# Patient Record
Sex: Female | Born: 1955 | Race: White | Hispanic: No | Marital: Married
Health system: Midwestern US, Academic
[De-identification: ages and names within clinical notes are randomized; demographics above are authoritative.]

---

## 2017-06-18 LAB — CBC
Lab: 119 — ABNORMAL LOW (ref 150–379)
Lab: 26 — ABNORMAL LOW (ref 26.6–33.0)
Lab: 31 — ABNORMAL LOW (ref 31.5–35.7)
Lab: 84

## 2017-07-30 ENCOUNTER — Encounter: Admit: 2017-07-30 | Discharge: 2017-07-30 | Payer: MEDICARE

## 2017-07-30 NOTE — Progress Notes
Records Request    Medical records request for continuation of care:    Patient has appointment on 08/15/17   with  Dr. Tyson Alias* .    Please fax records to [facility]  [phone]    Request records:        Recent Labs        Thank you,      [facility]  [facility]  [address] [place] [address]  Phone:  [phone]  Fax:  [phone]

## 2017-08-01 ENCOUNTER — Encounter: Admit: 2017-08-01 | Discharge: 2017-08-01 | Payer: MEDICARE

## 2017-08-15 ENCOUNTER — Encounter: Admit: 2017-08-15 | Discharge: 2017-08-15 | Payer: MEDICARE

## 2017-08-15 ENCOUNTER — Ambulatory Visit: Admit: 2017-08-15 | Discharge: 2017-08-16 | Payer: MEDICARE

## 2017-08-15 DIAGNOSIS — E669 Obesity, unspecified: Secondary | ICD-10-CM

## 2017-08-15 DIAGNOSIS — I10 Essential (primary) hypertension: ICD-10-CM

## 2017-08-15 DIAGNOSIS — R748 Abnormal levels of other serum enzymes: ICD-10-CM

## 2017-08-15 DIAGNOSIS — R945 Abnormal results of liver function studies: Principal | ICD-10-CM

## 2017-08-15 DIAGNOSIS — E119 Type 2 diabetes mellitus without complications: ICD-10-CM

## 2017-08-15 DIAGNOSIS — R0600 Dyspnea, unspecified: ICD-10-CM

## 2017-08-15 DIAGNOSIS — R109 Unspecified abdominal pain: ICD-10-CM

## 2017-08-15 DIAGNOSIS — E1121 Type 2 diabetes mellitus with diabetic nephropathy: ICD-10-CM

## 2017-08-15 DIAGNOSIS — K760 Fatty (change of) liver, not elsewhere classified: ICD-10-CM

## 2017-08-15 DIAGNOSIS — K5730 Diverticulosis of large intestine without perforation or abscess without bleeding: ICD-10-CM

## 2017-08-15 DIAGNOSIS — R0789 Other chest pain: ICD-10-CM

## 2017-08-15 DIAGNOSIS — J329 Chronic sinusitis, unspecified: ICD-10-CM

## 2017-08-15 NOTE — Assessment & Plan Note
Her most recent lipid profile looked great with an LDL in the 40s.

## 2017-08-15 NOTE — Assessment & Plan Note
She had a couple of episodes of chest discomfort that sounds GI.  If they persist I asked her to give me a call.

## 2017-08-15 NOTE — Assessment & Plan Note
Her blood pressure looks fine on the current medical program.

## 2017-08-15 NOTE — Progress Notes
Kristin Moody was in the [place] office today for follow-up regarding date of Service: 08/15/2017    Kristin Moody is a 61 y.o. female.       HPI     Her blood pressure and diabetes.  She is done pretty well over the past year but she has had a lot of trouble with acid reflux and I think this causes some of the chest discomfort that she has had from time to time.  She is not having problems with palpitations, syncope, or near syncope.  She is not having any TIA or stroke symptoms.         Vitals:    08/15/17 1456 08/15/17 1508   BP: 118/86 114/80   Pulse: 84    Weight: 100 kg (220 lb 6.4 oz)    Height: 1.6 m (5' 3)      Body mass index is 39.04 kg/m???.     Past Medical History  Patient Active Problem List    Diagnosis Date Noted   ??? Lower abdominal pain 10/24/2014   ??? Chest discomfort 09/21/2014   ??? Type 2 diabetes mellitus (HCC) 09/21/2014   ??? Essential hypertension 08/13/2012   ??? Abnormal liver function tests 05/13/2011   ??? 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 Obesity (BMI 30-39.9) 05/13/2011         Review of Systems   Constitution: Negative.   HENT: Negative.    Eyes: Negative.    Cardiovascular: Positive for chest pain.        Pressure     Respiratory: Negative.    Endocrine: Negative.    Hematologic/Lymphatic: Negative.    Skin: Negative.    Musculoskeletal: Negative.    Gastrointestinal: Negative.    Genitourinary: Negative.    Neurological: Negative.    Psychiatric/Behavioral: Negative.    Allergic/Immunologic: Negative.        Physical Exam    Physical Exam   General Appearance: no distress   Skin: warm, no ulcers or xanthomas   Digits and Nails: no cyanosis or clubbing   Eyes: conjunctivae and lids normal, pupils are equal and round   Teeth/Gums/Palate: dentition unremarkable, no lesions   Lips & Oral Mucosa: no pallor or cyanosis   Neck Veins: normal JVP , neck veins are not distended   Thyroid: no nodules, masses, tenderness or enlargement   Chest Inspection: chest is normal in appearance Respiratory Effort: breathing comfortably, no respiratory distress   Auscultation/Percussion: lungs clear to auscultation, no rales or rhonchi, no wheezing   PMI: PMI not enlarged or displaced   Cardiac Rhythm: regular rhythm and normal rate   Cardiac Auscultation: S1, S2 normal, no rub, no gallop   Murmurs: no murmur   Peripheral Circulation: normal peripheral circulation   Carotid Arteries: normal carotid upstroke bilaterally, no bruits   Radial Arteries: normal symmetric radial pulses   Abdominal Aorta: no abdominal aortic bruit   Pedal Pulses: normal symmetric pedal pulses   Lower Extremity Edema: no lower extremity edema   Abdominal Exam: soft, non-tender, no masses, bowel sounds normal   Liver & Spleen: no organomegaly   Gait & Station: walks without assistance   Muscle Strength: normal muscle tone   Orientation: oriented to time, place and person   Affect & Mood: appropriate and sustained affect   Language and Memory: patient responsive and seems to comprehend information   Neurologic Exam: neurological assessment grossly intact   Other: moves all extremities  Problems Addressed Today  Encounter Diagnoses   Name Primary?   ??? Essential hypertension    ??? Chest discomfort    ??? Type 2 diabetes mellitus with diabetic nephropathy, unspecified whether long term insulin use (HCC)        Assessment and Plan       Essential hypertension  Her blood pressure looks fine on the current medical program.    Chest discomfort  She had a couple of episodes of chest discomfort that sounds GI.  If they persist I asked her to give me a call.    Type 2 diabetes mellitus  Her most recent lipid profile looked great with an LDL in the 40s.      Current Medications (including today's revisions)  ??? aspirin-caffeine (BAYER BACK AND BODY) 500-32.5 mg tab Take 2 tablets by mouth daily as needed. Indications: PAIN   ??? atorvastatin (LIPITOR) 10 mg tablet Take 1 tablet by mouth daily.   ??? CALCIUM PO Take 600 mg by mouth twice daily. ??? carvedilol (COREG) 6.25 mg tablet Take 6.25 mg by mouth twice daily with meals. Take with food.   ??? cholecalciferol (VITAMIN D-3) 1,000 units tablet Take 1,000 Units by mouth daily.   ??? DULOXETINE HCL (CYMBALTA PO) Take 60 mg by mouth daily.   ??? furosemide (LASIX) 40 mg tablet Take 40 mg by mouth daily.   ??? metFORMIN (GLUCOPHAGE) 500 mg tablet Take 500 mg by mouth twice daily with meals.   ??? metoclopramide (REGLAN) 10 mg tablet Take 10 mg by mouth four times daily.   ??? omeprazole DR(+) (PRILOSEC) 20 mg capsule Take 1 capsule by mouth twice daily.   ??? POTASSIUM CHLORIDE (KLOR-CON M20 PO) Take 20 mg by mouth daily.   ??? promethazine (PHENERGAN) 12.5 mg PO tablet Take 25 mg by mouth every 6 hours as needed.   ??? ranitidine(+) (ZANTAC) 150 mg tablet Take 150 mg by mouth at bedtime daily.   ??? traMADol (ULTRAM) 50 mg tablet Take 50 mg by mouth daily as needed.

## 2017-08-21 ENCOUNTER — Encounter: Admit: 2017-08-21 | Discharge: 2017-08-21 | Payer: MEDICARE

## 2017-08-21 NOTE — Telephone Encounter
pt confirmed appt via televox.

## 2017-08-27 ENCOUNTER — Ambulatory Visit: Admit: 2017-08-27 | Discharge: 2017-08-27 | Payer: MEDICARE

## 2017-08-27 ENCOUNTER — Encounter: Admit: 2017-08-27 | Discharge: 2017-08-27 | Payer: MEDICARE

## 2017-08-27 DIAGNOSIS — I10 Essential (primary) hypertension: ICD-10-CM

## 2017-08-27 DIAGNOSIS — R1010 Upper abdominal pain, unspecified: ICD-10-CM

## 2017-08-27 DIAGNOSIS — E1121 Type 2 diabetes mellitus with diabetic nephropathy: ICD-10-CM

## 2017-08-27 DIAGNOSIS — K760 Fatty (change of) liver, not elsewhere classified: Principal | ICD-10-CM

## 2017-08-27 DIAGNOSIS — K5730 Diverticulosis of large intestine without perforation or abscess without bleeding: ICD-10-CM

## 2017-08-27 DIAGNOSIS — E669 Obesity, unspecified: Secondary | ICD-10-CM

## 2017-08-27 DIAGNOSIS — R945 Abnormal results of liver function studies: Secondary | ICD-10-CM

## 2017-08-27 DIAGNOSIS — E119 Type 2 diabetes mellitus without complications: ICD-10-CM

## 2017-08-27 DIAGNOSIS — R0600 Dyspnea, unspecified: ICD-10-CM

## 2017-08-27 DIAGNOSIS — R748 Abnormal levels of other serum enzymes: ICD-10-CM

## 2017-08-27 DIAGNOSIS — J329 Chronic sinusitis, unspecified: ICD-10-CM

## 2017-08-27 DIAGNOSIS — Z23 Encounter for immunization: ICD-10-CM

## 2017-08-27 DIAGNOSIS — I864 Gastric varices: ICD-10-CM

## 2017-08-27 DIAGNOSIS — R1030 Lower abdominal pain, unspecified: ICD-10-CM

## 2017-08-27 DIAGNOSIS — K31819 Angiodysplasia of stomach and duodenum without bleeding: ICD-10-CM

## 2017-08-27 DIAGNOSIS — K740 Hepatic fibrosis: ICD-10-CM

## 2017-08-27 DIAGNOSIS — R109 Unspecified abdominal pain: ICD-10-CM

## 2017-08-27 DIAGNOSIS — K5900 Constipation, unspecified: Principal | ICD-10-CM

## 2017-08-27 DIAGNOSIS — K5901 Slow transit constipation: ICD-10-CM

## 2017-08-27 LAB — PROTIME INR (PT): Lab: 1.2 MMOL/L (ref 0.8–1.2)

## 2017-08-27 LAB — COMPREHENSIVE METABOLIC PANEL
Lab: 0.9 mg/dL — ABNORMAL HIGH (ref 0.4–1.00)
Lab: 1.7 mg/dL — ABNORMAL HIGH (ref 0.3–1.2)
Lab: 138 MMOL/L (ref 137–147)
Lab: 14 mg/dL (ref 7–25)
Lab: 160 U/L — ABNORMAL HIGH (ref 25–110)
Lab: 203 mg/dL — ABNORMAL HIGH (ref 70–100)
Lab: 27 MMOL/L (ref 21–30)
Lab: 28 U/L (ref 7–56)
Lab: 3.9 MMOL/L (ref 3.5–5.1)
Lab: 4 g/dL — ABNORMAL LOW (ref 3.5–5.0)
Lab: 50 U/L — ABNORMAL HIGH (ref 7–40)
Lab: 7.8 g/dL (ref 6.0–8.0)
Lab: >60 mL/min (ref >60)
Lab: >60 mL/min — ABNORMAL HIGH (ref >60)

## 2017-08-27 LAB — CBC AND DIFF
Lab: 0 10*3/uL (ref 0–0.20)
Lab: 4.5 M/UL (ref 4.0–5.0)
Lab: 9.5 10*3/uL (ref 4.5–11.0)

## 2017-08-27 LAB — ALPHA FETO PROTEIN (AFP): Lab: 6.2 ng/mL (ref 0.0–15.0)

## 2017-08-27 MED ORDER — SODIUM CHLORIDE 0.9 % IV SOLP
INTRAVENOUS | 0 refills | Status: CN
Start: 2017-08-27 — End: ?

## 2017-08-27 NOTE — Progress Notes
Date of Service: 08/27/2017     Subjective:          Kristin Moody is a 61 y.o. female presenting for follow up of liver disease.  Accompanied by husband.    History of Present Illness  Kristin Moody is a 61 y.o. female  [name] presents to the hepatology clinic for continued management of fatty liver disease.  Last seen in clinic April 2018. PMH: DM2 - uncontrolled, chronic pain, hypertension, hyperlipidemia, obesity, GERD s/p fundoplication.  She has been seen in the past by Dr. Dominic Pea, hepatologist, at [facility] [place]. She had liver biopsy 2011 revealed steatosis with stage 1/6 fibrosis. Heterozygous for Alpha-1 antitrypsin. Liver bx was negative.  Recommended low fat diet, exercise, weight loss but has been challenging given she has chronic abdominal pain which is managed by the [facility] pain management team.  She is noncompliant with healthy food choices as well.  Since last seen, in May she developed severe fatigue, shortness of breath, palpitations,  abdominal distention and worsening abdominal pain. She was admitted to [facility] with severe anemia, hgb 5.9. + stool for occult blood.   EGD performed noted GAVE.  No colonoscopy was done as felt GAVE was mostly cause of anemia.  She reports hgb several weeks ago was around 12.  She reports abdominal pain to RUQ and epigastric area had lessened but now has returned. She continues to have abdominal distention. She has had issues with constipation for quite some time but feels getting worse. This is making hemorrhoids bleed more. She was given rectal cream for this.  She stools on average 2 BMs weekly with miralax daily dosing.  No hematochezia or melena. No N/V, ascites, edema, confusion.  She has lost 9 lbs unintentionally.  She has decreased soda pop consumption some otherwise she is unsure as to what to attribute the weight loss to.  Blood sugars remains slightly elevated around the 160s range.  No other changes or new complaints. FH:  Twin sister with A1A, s/p OLT at [facility].     SH;  Negative for tobacco, ETOH, substance use. Married, lives with husband  Past Medical History:  Past Medical History:   Diagnosis Date   ??? Abnormal liver function tests     mildly elevated AST and ALT levels   ??? Chest pain    ??? Chronic abdominal pain    ??? Diverticulosis of colon     descending and sigmoid colon   ??? Dyspnea    ??? Elevated liver enzymes    ??? Essential hypertension 08/13/2012   ??? Fatty infiltration of liver    ??? HTN (hypertension)    ??? Obesity    ??? Obesity (BMI 30-39.9) 05/13/2011   ??? Sinus infection    ??? Type 2 diabetes mellitus (HCC) 09/21/2014       Surgical History:  Past Surgical History:   Procedure Laterality Date   ??? HYSTERECTOMY  1994   ??? UPPER GASTROINTESTINAL ENDOSCOPY  2009   ??? COLONOSCOPY  2009   ??? CYSTOCELE REPAIR  2009    with endocele repair   ??? RECTOCELE REPAIR  03/2009   ??? LIVER BIOPSY  07/17/2010   ??? CHOLECYSTECTOMY     ??? COLONOSCOPY     ??? ESOPHAGOGASTRIC FUNDOPLICATION  2003, 2004    laparoscopic   ??? PR ESOPHAGOSCOPY FLEXIBLE TRANSORAL DIAGNOSTIC              Review of Systems  A complete 10 point  review of systems was asked and answered in the negative unless specifically commented upon in the HPI.    Objective:         ??? aspirin-caffeine (BAYER BACK AND BODY) 500-32.5 mg tab Take 2 tablets by mouth daily as needed. Indications: PAIN   ??? atorvastatin (LIPITOR) 10 mg tablet Take 1 tablet by mouth daily.   ??? CALCIUM PO Take 600 mg by mouth twice daily.   ??? carvedilol (COREG) 6.25 mg tablet Take 6.25 mg by mouth twice daily with meals. Take with food.   ??? cholecalciferol (VITAMIN D-3) 1,000 units tablet Take 1,000 Units by mouth daily.   ??? DULOXETINE HCL (CYMBALTA PO) Take 60 mg by mouth daily.   ??? furosemide (LASIX) 40 mg tablet Take 40 mg by mouth daily.   ??? metFORMIN (GLUCOPHAGE) 500 mg tablet Take 500 mg by mouth twice daily with meals.   ??? metoclopramide (REGLAN) 10 mg tablet Take 10 mg by mouth four times daily. ??? omeprazole DR(+) (PRILOSEC) 20 mg capsule Take 1 capsule by mouth twice daily.   ??? POTASSIUM CHLORIDE (KLOR-CON M20 PO) Take 20 mg by mouth daily.   ??? promethazine (PHENERGAN) 12.5 mg PO tablet Take 25 mg by mouth every 6 hours as needed.   ??? ranitidine(+) (ZANTAC) 150 mg tablet Take 150 mg by mouth at bedtime daily.   ??? traMADol (ULTRAM) 50 mg tablet Take 50 mg by mouth daily as needed.     Vitals:    08/27/17 1110   BP: 122/59   Pulse: 84   Resp: 16   Temp: 36.7 ???C (98 ???F)   TempSrc: Oral   SpO2: 94%   Weight: 98.1 kg (216 lb 3.2 oz)   Height: 160 cm (63)     Body mass index is 38.3 kg/m???.     Physical Exam    Vitals reviewed.   Constitutional: Well-developed, well-nourished, in no apparent distress.    HEENT: Normocephalic. no scleral icterus. Dentition good.  Cardiac:  Regular rate and rhythm.    Respiratory: decreased to auscultation bilaterally.  No wheezes or rales.  GI:  Abdomen soft, mildly distended, + tenderness to light palpation RUQ and epigastric area. BS present. no shifting dullness. + hepatomegaly.      Skin:  Skin is warm and dry. No rash noted.  No jaundice. no spider nevi noted.  no palmar erythema.  Peripheral Vascular: mild lower extremity edema.   Musculoskeletal:  ROM intact, no muscle wasting.   Psychiatric: Normal mood and affect. Behavior is normal.  Neuro:  no asterixis, slight tremor.         Labs and Diagnostic tests:  CBC w/Diff    Lab Results   Component Value Date/Time    WBC 6.9 06/18/2017    RBC 4.74 06/18/2017    HGB 12.4 06/18/2017    HCT 39.7 06/18/2017    MCV 84 06/18/2017    MCH 26.2 (L) 06/18/2017    MCHC 31.2 (L) 06/18/2017    RDW 21.4 (H) 06/18/2017    PLTCT 119 (L) 06/18/2017    MPV 9.3 08/10/2016 11:54 AM    Lab Results   Component Value Date/Time    NEUT 63 08/10/2016 11:54 AM    ANC 5.10 08/10/2016 11:54 AM    LYMA 25 08/10/2016 11:54 AM    ALC 2.00 08/10/2016 11:54 AM    MONA 8 08/10/2016 11:54 AM    AMC 0.70 08/10/2016 11:54 AM    EOSA 3 08/10/2016 11:54 AM AEC  0.30 08/10/2016 11:54 AM    BASA 1 08/10/2016 11:54 AM    ABC 0.10 08/10/2016 11:54 AM        Comprehensive Metabolic Profile    Lab Results   Component Value Date/Time    NA 140 01/01/2017    K 4.7 01/01/2017    CL 99 01/01/2017    CO2 27 01/01/2017    GAP 9 08/10/2016 11:54 AM    BUN 12 01/01/2017    CR 0.88 01/01/2017    GLU 139 (H) 01/01/2017    Lab Results   Component Value Date/Time    CA 9.6 01/01/2017    ALBUMIN 3.7 01/01/2017    TOTPROT 6.9 01/01/2017    ALKPHOS 146 (H) 01/01/2017    AST 45 (H) 01/01/2017    ALT 24 01/01/2017    TOTBILI 0.9 01/01/2017    GFR 58 (L) 08/10/2016 11:54 AM    GFRAA >60 08/10/2016 11:54 AM          Imaging:    US ABDOMEN COMPLETE  April 2018    Clinical Indication: Female, 61 years old. ???Fatty liver disease. Screening   for HCC.    Comparison: No prior studies are available for comparison.    Technique: Multiple real-time grayscale sonographic images were obtained   of the abdomen. Additional color Doppler images were obtained.     Findings:    The visualized pancreas is unremarkable.    The visualized aorta is unremarkable. ???The visualized portion of the upper   IVC is unremarkable.    There is diffuse increased echogenicity throughout the heterogeneous   liver, which measures 18.4 cm. No focal lesions identified.     The gallbladder is surgically absent. The common duct measures 0.6 cm,   previously 0.6 cm.    The spleen measures 11.0 cm compared to 10.0 cm previously without focal   lesions.    The right kidney measures 11.9 x 4.1 cm. The left kidney measures 10.6 x   6.2 cm. No hydronephrosis, nephrolithiasis is identified, or renal mass is   identified.     The urinary bladder is unremarkable. No abdominal or pelvic ascites is   identified.     IMPRESSION  Mild hepatomegaly and diffuse hepatic steatosis. No discrete hepatic mass.    Finalized by Judie Petit, M.D. on 02/28/2017 5:48 PM. Dictated by Laray Anger, M.D. on 02/28/2017 2:18 PM.  CT angio 04/08/17: No pulmonary embolism seen.  Mild cardiac prominence is seen.  Mild mediastinal and possibly mild hilar adenopathy which is nonspecific.  Elevation of the right diaphragm with some vascular crowding and atelectasis versus minimal strand-like infiltrate in the medial right middle lobe.  The remainder of the lungs are clear.     Endoscopy:  EGD 04/09/17 [facility]:      Pathology:  Fragments of hyperplastic polyp with ulceration and thermal artifact. Detached fragment of fibrinoid material with acute inflammation, consistent with ulcer bed.     Assessment and Plan:  Non-Alcoholic Fatty Liver Disease  -we have had long discussion about nonalcoholic fatty liver disease (NAFLD) in the past.  We discussed how fat deposits in the liver, how this leads to inflammation, and how chronic inflammation (NASH) can ultimately lead to scarring and cirrhosis.  The long-term complications of fatty liver disease were discussed with the patient, including the increased risk of cardiovascular disease complications,the risk of developing diabetes (if not already), and variable progression to cirrhosis and end stage liver disease.  -  At this time, the only way to differentiate between benign steatosis vs. nonalcoholic steatohepatitis (NASH) is to perform a liver biopsy which pt had done in 2011 showing 50% steatosis and stage I fibrosis.   - It is important to note that liver tests alone as a screening test for NASH are not sensitive, as up to 20-30% of patients can have NASH with fibrosis despite having normal liver chemistries.   - Regarding the management of fatty liver disease, we have discussed about an appropriate diet, exercise and weight loss plan.  The patient should exercise regularly 3-4 times per week, with an average of about 30-45 minutes per day. The patient should be on low-carbohydrate, low-calorie diet (1500-1700 calories per day). The weight loss plan is to lose 7-10% of body weight in the next three to six months' time.  It has shown that patients who exercise regularly can have improvement of insulin resistance and resolution of fatty liver disease, even if they are not able to lose weight.   - We plan to order liver tests to be checked every 6  months to assess for dynamic changes, as a potential marker of another cause of chronic liver disease. Will update CBC, CMP, INR today to assess for any changes.     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  Twin sister with A1A, S/p OLT at [facility].       Lipid Management/Screening  - Management of elevated cholesterol is also important in this population.  The use of statins (HMG-CoA reductase medications) are an effective means of therapy and are not contra-indicated in those with abnormal liver tests OR those with cirrhosis.  The value of these medications in this population far outweigh the minor risks of abnormal liver tests.  Goal LDL in those with NASH are < 100 mg/dL. Lipid panel in Feb 2018 showed LDL, TC, trigs at goal.  Continue with statin therapy.     Diabetes Management/Screening:  - Diabetes management is crucial with ideal goal Hgb A1c goal of =/< 7%. She is on metformin and recommend she continue.  Will check hgba1c today.     GI bleed:  Developed shortness of breath with exertion, palpitations, IDA, severe anemia with hgb 5.9. Was seen at [facility].  EGD 04/09/17 showed stomach polyps that were inflamed/friable (single polypectomy) and scattered petechiae with friability verses early GAVE. Cauterized.  Felt this was likely source of bleeding so colonoscopy was not done.  She was transfused with 3 units blood and started oral iron supplementation which was recently stopped.  She continues on PPI and H2 blocker therapy.  No mention of varices in report.    She continues to have abdominal distention with abdominal pain in the epigastric and RUQ areas very similar to that in May. Although she does not observe melena or hematochezia she remains concerned about the pain and distention.  Will schedule for EGD along with colonoscopy, as discussed below, to reassess gastric area.  Will check [place] to follow up on worsening RUQ and epigastric pain.     Adult Health Maintenance:   - The patient's last colonoscopy was 2009  and it noted normal.  - she was recommended to repeat in 10 years, however, given she has had worsening constipation issues causing worsening hemorrhoidal issues.  Recommend moving forward with scheduling colonoscopy to reassess given worsening constipation issues.  Will schedule under MAC. Bowel prep was discussed.  Immunizations:   + immunity to HAV and HBV.  Flu vaccine given today per pt request.     Obesity:  BMI 38.3.      Nutrition:  -As with most patients with chronic liver disease, there is a significant degree of protein malnutrition.  Dicussed need to change dietary habits to improve overall protein balance.  Discussed the importance of eating between 1.2-1.5g/kg/day lean protein like eggs, fish, chicken, nuts, and legumes, in addition to a diet rich in fresh fruits and vegetables.  Continue to follow a sodium restricted (<2g sodium diet) and discussed the need to minimize the intake of carbohydrates and sugars, to avoid obesity.   - Recommend a bedtime snack with protein and complex carbohydrate to minimize risk of muscle catabolism overnight.    Routine Health Care in Patient with Chronic Liver Disease:  - All patients with liver disease should avoid the use of Non-steroidal Anti-Inflammatory (NSAID) medications as they can cause significant injury to the kidneys in this population.  - Recommended the patient avoid herbal supplements and over the counter herbal remedies due to potential hepatic toxicities.      Follow Up:   6 months or sooner if needed. The patient is to call our office with any questions. Thank you very much for the opportunity to participate in the care of this patient.  If you have any further questions, please don't hesitate to contact our office.    _____________________________  [name] APRN   [facility]  Hepatology  Office Number: [phone]

## 2017-08-29 DIAGNOSIS — K5900 Constipation, unspecified: ICD-10-CM

## 2017-08-29 LAB — HEMOGLOBIN A1C: Lab: 10 % — ABNORMAL HIGH (ref 4.0–6.0)

## 2017-09-05 ENCOUNTER — Encounter: Admit: 2017-09-05 | Discharge: 2017-09-05 | Payer: MEDICARE

## 2017-09-05 DIAGNOSIS — K760 Fatty (change of) liver, not elsewhere classified: Principal | ICD-10-CM

## 2017-09-05 DIAGNOSIS — R945 Abnormal results of liver function studies: ICD-10-CM

## 2017-09-05 DIAGNOSIS — K740 Hepatic fibrosis: ICD-10-CM

## 2017-09-05 NOTE — Telephone Encounter
-----   Message from Hermine Messick, APRN sent at 09/02/2017 10:01 AM CDT -----  Hgba1c has worsened.  Recommend she follow up with DM provider to work on glucose control. AFP is normal. Mild increase in liver tests.  MELD is 10. Plan to repeat labs in 6 months. Contact pt with results and recommendations.

## 2017-09-05 NOTE — Telephone Encounter
Reviewed lab results with patient.  Will fax labs to Dr Chalmers Cater for review as she is managing DM.

## 2017-10-07 ENCOUNTER — Encounter: Admit: 2017-10-07 | Discharge: 2017-10-07 | Payer: MEDICARE

## 2017-10-07 DIAGNOSIS — K760 Fatty (change of) liver, not elsewhere classified: Principal | ICD-10-CM

## 2017-10-07 LAB — BASIC METABOLIC PANEL
Lab: 0.8 K/UL — ABNORMAL LOW (ref 34.0–46.6)
Lab: 12 U/L — ABNORMAL LOW (ref 11.1–15.9)
Lab: 136 K/UL — ABNORMAL LOW (ref 31.5–35.7)
Lab: 22
Lab: 350 MMOL/L — ABNORMAL HIGH (ref 65–99)
Lab: 5
Lab: 72 mL/min — ABNORMAL LOW (ref >60)
Lab: 83 mL/min — ABNORMAL LOW (ref >60)
Lab: 9.3
Lab: 97 — ABNORMAL LOW (ref 150–379)

## 2017-10-07 LAB — CBC AND DIFF: Lab: 7.2 U/L (ref 7–40)

## 2017-10-17 ENCOUNTER — Encounter: Admit: 2017-10-17 | Discharge: 2017-10-17 | Payer: MEDICARE

## 2017-10-17 NOTE — Telephone Encounter
Reviewed results with patient.  PCP has ordered Iron to treat anemia.  She will get a scope done on 12/21.  I reminded her of signs of GIB (tarry stools and coffee ground emesis.)

## 2017-10-17 NOTE — Telephone Encounter
-----   Message from Hermine Messick, APRN sent at 10/13/2017  9:28 PM CST -----  Blood glucose is too high.  Work on glucose control. hgb has dropped 3 gms in one month. She had recent EGD that was abnormal and was considered source of anemia.  She needs to get colonoscopy done and may need repeat EGD if she is having black, tarry stools.  Contact pt with results and recommendations. Please ensure she follows up with her PCP.

## 2017-10-31 ENCOUNTER — Encounter: Admit: 2017-10-31 | Discharge: 2017-10-31 | Payer: MEDICARE

## 2017-10-31 DIAGNOSIS — K760 Fatty (change of) liver, not elsewhere classified: Principal | ICD-10-CM

## 2017-10-31 LAB — CBC AND DIFF
Lab: 0.1
Lab: 1
Lab: 1.8
Lab: 10 — ABNORMAL LOW (ref 11.1–15.9)
Lab: 169
Lab: 18 — ABNORMAL HIGH (ref 12.3–15.4)
Lab: 23 — ABNORMAL LOW (ref 26.6–33.0)
Lab: 3.1
Lab: 30 — ABNORMAL LOW (ref 31.5–35.7)
Lab: 32
Lab: 33 — ABNORMAL LOW (ref 34.0–46.6)
Lab: 4.3
Lab: 5.7
Lab: 54
Lab: 0.3
Lab: 0.5
Lab: 5
Lab: 77 — ABNORMAL LOW (ref 79–97)
Lab: 8

## 2017-11-01 ENCOUNTER — Encounter: Admit: 2017-11-01 | Discharge: 2017-11-01 | Payer: MEDICARE

## 2017-11-01 MED ORDER — ATORVASTATIN 10 MG PO TAB
10 mg | ORAL_TABLET | Freq: Every day | ORAL | 3 refills | Status: AC
Start: 2017-11-01 — End: 2018-12-23

## 2017-11-04 ENCOUNTER — Encounter: Admit: 2017-11-04 | Discharge: 2017-11-04 | Payer: MEDICARE

## 2017-11-04 MED ORDER — PEG-ELECTROLYTE SOLN 420 GRAM PO SOLR
4 L | Freq: Once | ORAL | 0 refills | Status: AC
Start: 2017-11-04 — End: ?

## 2017-11-04 NOTE — Telephone Encounter
-----   Message from Hermine Messick, APRN sent at 11/03/2017  2:07 PM CST -----  Mild improvement in anemia.  Fax result to PCP clinic for review.

## 2017-11-04 NOTE — Telephone Encounter
Reviewed lab orders with patient.  RX for bowel prep for scope on Friday.  Instructed patient to have Clear liquids Wed night and Thursday.  Bowel Prep on Thursday.  NPO after MN Friday.

## 2017-11-07 DIAGNOSIS — Z1211 Encounter for screening for malignant neoplasm of colon: Principal | ICD-10-CM

## 2017-11-07 DIAGNOSIS — D509 Iron deficiency anemia, unspecified: Secondary | ICD-10-CM

## 2017-11-08 ENCOUNTER — Ambulatory Visit: Admit: 2017-11-08 | Discharge: 2017-11-08 | Payer: MEDICARE

## 2017-11-08 ENCOUNTER — Ambulatory Visit: Admit: 2017-11-07 | Discharge: 2017-11-07 | Payer: MEDICARE

## 2017-11-08 ENCOUNTER — Encounter: Admit: 2017-11-08 | Discharge: 2017-11-08 | Payer: MEDICARE

## 2017-11-08 DIAGNOSIS — K760 Fatty (change of) liver, not elsewhere classified: ICD-10-CM

## 2017-11-08 DIAGNOSIS — R109 Unspecified abdominal pain: ICD-10-CM

## 2017-11-08 DIAGNOSIS — D127 Benign neoplasm of rectosigmoid junction: ICD-10-CM

## 2017-11-08 DIAGNOSIS — K5730 Diverticulosis of large intestine without perforation or abscess without bleeding: ICD-10-CM

## 2017-11-08 DIAGNOSIS — D122 Benign neoplasm of ascending colon: ICD-10-CM

## 2017-11-08 DIAGNOSIS — D123 Benign neoplasm of transverse colon: ICD-10-CM

## 2017-11-08 DIAGNOSIS — Z1211 Encounter for screening for malignant neoplasm of colon: Principal | ICD-10-CM

## 2017-11-08 DIAGNOSIS — K210 Gastro-esophageal reflux disease with esophagitis: ICD-10-CM

## 2017-11-08 DIAGNOSIS — K317 Polyp of stomach and duodenum: ICD-10-CM

## 2017-11-08 DIAGNOSIS — D509 Iron deficiency anemia, unspecified: ICD-10-CM

## 2017-11-08 DIAGNOSIS — K648 Other hemorrhoids: ICD-10-CM

## 2017-11-08 DIAGNOSIS — I10 Essential (primary) hypertension: ICD-10-CM

## 2017-11-08 DIAGNOSIS — E119 Type 2 diabetes mellitus without complications: ICD-10-CM

## 2017-11-08 DIAGNOSIS — E669 Obesity, unspecified: ICD-10-CM

## 2017-11-08 DIAGNOSIS — K766 Portal hypertension: ICD-10-CM

## 2017-11-08 DIAGNOSIS — I868 Varicose veins of other specified sites: ICD-10-CM

## 2017-11-08 DIAGNOSIS — K3189 Other diseases of stomach and duodenum: ICD-10-CM

## 2017-11-08 DIAGNOSIS — K31819 Angiodysplasia of stomach and duodenum without bleeding: ICD-10-CM

## 2017-11-08 DIAGNOSIS — K644 Residual hemorrhoidal skin tags: ICD-10-CM

## 2017-11-08 DIAGNOSIS — R0600 Dyspnea, unspecified: ICD-10-CM

## 2017-11-08 DIAGNOSIS — R945 Abnormal results of liver function studies: Principal | ICD-10-CM

## 2017-11-08 DIAGNOSIS — J329 Chronic sinusitis, unspecified: ICD-10-CM

## 2017-11-08 DIAGNOSIS — R748 Abnormal levels of other serum enzymes: ICD-10-CM

## 2017-11-08 LAB — POC GLUCOSE: Lab: 173 mg/dL — ABNORMAL HIGH (ref 70–100)

## 2017-11-08 MED ORDER — PROPOFOL 10 MG/ML IV EMUL 20 ML (INFUSION)(AM)(OR)
INTRAVENOUS | 0 refills | Status: DC
Start: 2017-11-08 — End: 2017-11-08
  Administered 2017-11-08: 15:00:00 120 ug/kg/min via INTRAVENOUS

## 2017-11-08 MED ORDER — LACTATED RINGERS IV SOLP
1000 mL | Freq: Once | INTRAVENOUS | 0 refills | Status: CP
Start: 2017-11-08 — End: ?
  Administered 2017-11-08: 15:00:00 1000.000 mL via INTRAVENOUS

## 2017-11-08 MED ORDER — PROPOFOL INJ 10 MG/ML IV VIAL
0 refills | Status: DC
Start: 2017-11-08 — End: 2017-11-08
  Administered 2017-11-08: 15:00:00 30 mg via INTRAVENOUS
  Administered 2017-11-08 (×2): 20 mg via INTRAVENOUS
  Administered 2017-11-08: 15:00:00 30 mg via INTRAVENOUS
  Administered 2017-11-08: 15:00:00 40 mg via INTRAVENOUS
  Administered 2017-11-08 (×2): 30 mg via INTRAVENOUS

## 2017-11-08 MED ORDER — LIDOCAINE (PF) 200 MG/10 ML (2 %) IJ SYRG
0 refills | Status: DC
Start: 2017-11-08 — End: 2017-11-08
  Administered 2017-11-08: 15:00:00 80 mg via INTRAVENOUS

## 2017-11-08 NOTE — Discharge Planning (AHS/AVS)
EGD/Upper EUS/ERCP/Antegrade Enteroscopy  Post Upper Endoscopy Instructions      -Nothing to eat or drink for 1.5 hours after your procedure if you have had the numbing gargle or spray.  Start with small sips of water at _____________.  If tolerated well, you may advance your diet as tolerated or directed by your physician.      -You may have a sore throat after the procedure for 2-3 days.  Try sucrets or lozenges to help ease the pain.  If it continues please contact us.    -If you feel feverish, have a temperature of 101 degrees or higher, persistent nausea and vomiting, abdominal pain or dark stools; please notify your nurse or GI physician.    -You may have abdominal cramping following the procedure this can be relieved by belching or passing air.    -If you have redness or swelling at the IV site, place a warm, wet washcloth over the affected areas for 15 minutes, 3-4 times a day until the redness subsides.  If symptoms continue for 2-3 days, contact your regular physician.    - If you have bleeding from your mouth, over 2 tablespoons and increasing, please notify your physician.  A small amount of bleeding is normal if a biopsy or polyps were taken.  If you are vomiting blood you need to seek immediate medical attention.    - You may resume all your routine medications, if medications need to be held your physician and/or nurse will notify you post procedure.    SPECIFIC INSTRUCTIONS  INPATIENTS:  Ask for help when you get up in your room, as you may still be drowsy from your sedation.    OUTPATIENTS:  A. Because of sedation and lack of coordination, UNTIL TOMORROW, DO NOT:  1. Operate any motorized vehicle - this includes driving.  2. Sign any legal documents or conduct important business matters.  3. Use any dangerous machinery (chain saw, lawnmower, etc.).  4. Drink any alcoholic beverages.  Should you have any questions or concerns after your procedure please call [phone] M-F 8am-5:00 pm. After 5:00 pm, holidays or weekends call [phone] and ask for the GI Doctor on call.

## 2017-11-08 NOTE — Anesthesia Pre-Procedure Evaluation
Anesthesia Pre-Procedure Evaluation    Name: Kristin Moody      MRN: [id number]     DOB: 03-16-1956     Age: 61 y.o.     Sex: female   __________________________________________________________________________     Procedure Date: 11/08/2017   Procedure: Procedure(s):  COLONOSCOPY  ESOPHAGOGASTRODUODENOSCOPY     Physical Assessment  Vital Signs (last filed in past 24 hours):         Patient History  Allergies   Allergen Reactions   ??? Contrast Dye Iv, Iodine Containing [Iodinated Contrast- Oral And Iv Dye] RASH   ??? Sulfa (Sulfonamide Antibiotics) RASH   ??? Codeine NAUSEA AND VOMITING   ??? Morphine SEE COMMENTS     Pt reports burns her veins   ??? Penicillin G SEE COMMENTS     Throat issues, blisters in throat    ??? Tramadol NAUSEA ONLY and ITCHING     Takes this medication regularly         Current Medications    Medication Directions   aspirin-caffeine (BAYER BACK AND BODY) 500-32.5 mg tab Take 2 tablets by mouth daily as needed. Indications: PAIN   atorvastatin (LIPITOR) 10 mg tablet TAKE 1 TABLET BY MOUTH DAILY   CALCIUM PO Take 600 mg by mouth twice daily.   carvedilol (COREG) 6.25 mg tablet Take 6.25 mg by mouth twice daily with meals. Take with food.   cholecalciferol (VITAMIN D-3) 1,000 units tablet Take 1,000 Units by mouth daily.   DULOXETINE HCL (CYMBALTA PO) Take 60 mg by mouth daily.   furosemide (LASIX) 40 mg tablet Take 40 mg by mouth daily.   metFORMIN (GLUCOPHAGE) 500 mg tablet Take 500 mg by mouth twice daily with meals.   metoclopramide (REGLAN) 10 mg tablet Take 10 mg by mouth four times daily.   omeprazole DR(+) (PRILOSEC) 20 mg capsule Take 1 capsule by mouth twice daily.   POTASSIUM CHLORIDE (KLOR-CON M20 PO) Take 20 mg by mouth daily.   promethazine (PHENERGAN) 12.5 mg PO tablet Take 25 mg by mouth every 6 hours as needed.   ranitidine(+) (ZANTAC) 150 mg tablet Take 150 mg by mouth at bedtime daily.   traMADol (ULTRAM) 50 mg tablet Take 50 mg by mouth daily as needed. Review of Systems/Medical History      Patient summary reviewed  Nursing notes reviewed  Pertinent labs reviewed    PONV Screening: Female gender and Non-smoker  No history of anesthetic complications  No family history of anesthetic complications        Pulmonary         no COPD      Shortness of breath      Cardiovascular          Beta Blocker therapy: Yes      Beta blockers within 24 hours: Yes      Hypertension,       Hyperlipidemia      Orthopnea      Dyspnea on exertion      GI/Hepatic/Renal           GERD, poorly controlled      Liver disease (NASH/A1A deficiency)      No ascites      No esophageal varices      Hx of GI bleed in May 2018 treated at [facility] with cautery to stomach- dx with gastric varices        Neuro/Psych       No seizures      No  CVA      Chronic opioid use        Psychiatric history          Anxiety        Endocrine/Other       Diabetes, poorly controlled, type 2; using insulin      Obesity   Physical Exam    Airway Findings      Mallampati: III      TM distance: >3 FB      Neck ROM: full      Mouth opening: good      Airway patency: adequate    Dental Findings:       Upper dentures and lower dentures    Cardiovascular Findings:       Rhythm: regular      Rate: normal      No murmur    Pulmonary Findings:       Breath sounds clear to auscultation.       Diagnostic Tests  Hematology:   Lab Results   Component Value Date    HGB 10.0 10/28/2017    HCT 33.0 10/28/2017    PLTCT 169 10/28/2017    WBC 5.7 10/28/2017    NEUT 54 10/28/2017    ANC 3.1 10/28/2017    LYMPH 32 10/28/2017    ALC 1.8 10/28/2017    MONA 9 08/27/2017    AMC 0.5 10/28/2017    EOSA 2 08/27/2017    ABC 0.1 10/28/2017    BASOPHILS 1 10/28/2017    MCV 77 10/28/2017    MCH 23.2 10/28/2017    MCHC 30.3 10/28/2017    MPV 10.4 08/27/2017    RDW 18.4 10/28/2017         General Chemistry:   Lab Results   Component Value Date    NA 136 10/03/2017    K 5.0 10/03/2017    CL 97 10/03/2017    CO2 22 10/03/2017    GAP 11 08/27/2017 BUN 12 10/03/2017    CR 0.88 10/03/2017    GLU 350 10/03/2017    CA 9.3 10/03/2017    ALBUMIN 4.0 08/27/2017    TOTBILI 1.7 08/27/2017      Coagulation:   Lab Results   Component Value Date    PT 12.1 02/09/2015    PTT 26.5 04/28/2012    INR 1.2 08/27/2017         Anesthesia Plan    ASA score: 3   Plan: MAC  Induction method: intravenous  NPO status: acceptable      Informed Consent  Anesthetic plan and risks discussed with patient.  Use of blood products discussed with patient  Blood Consent: consented      Plan discussed with: anesthesiologist, SRNA and CRNA.

## 2017-11-08 NOTE — H&P (View-Only)
Pre Procedure History and Physical/Sedation Plan    Name:Kristin Moody                                                                   MRN: [id number]                 DOB:10-Mar-1956          Age: 61 y.o.  Date of Service: 11/08/2017    Date of Procedure:  11/08/2017    Planned Procedure(s):  GI:  EGD and Colonoscopy  Sedation/Medication Plan: MAC (Monitored Anesthesia Care)  Discussion/Reviews:  Physician has discussed risks and alternatives of this type of sedation and above planned procedures with patient  ___________________________________________________________________  Chief Complaint:  Anemia, abdominal pain    History of Present Illness: Kristin Moody is a 61 y.o. female who is here for EGD and colonoscopy for evaluation of anemia, abdominal pain and screening colonosocpy    Previous Anesthetic/Sedation History:  Reviewed    Past Medical History:   Diagnosis Date   ??? Abnormal liver function tests     mildly elevated AST and ALT levels   ??? Chest pain    ??? Chronic abdominal pain    ??? Diverticulosis of colon     descending and sigmoid colon   ??? Dyspnea    ??? Elevated liver enzymes    ??? Essential hypertension 08/13/2012   ??? Fatty infiltration of liver    ??? HTN (hypertension)    ??? Obesity    ??? Obesity (BMI 30-39.9) 05/13/2011   ??? Sinus infection    ??? Type 2 diabetes mellitus (HCC) 09/21/2014     Past Surgical History:   Procedure Laterality Date   ??? HYSTERECTOMY  1994   ??? UPPER GASTROINTESTINAL ENDOSCOPY  2009   ??? COLONOSCOPY  2009   ??? CYSTOCELE REPAIR  2009    with endocele repair   ??? RECTOCELE REPAIR  03/2009   ??? LIVER BIOPSY  07/17/2010   ??? CHOLECYSTECTOMY     ??? COLONOSCOPY     ??? ESOPHAGOGASTRIC FUNDOPLICATION  2003, 2004    laparoscopic   ??? PR ESOPHAGOSCOPY FLEXIBLE TRANSORAL DIAGNOSTIC       Pertinent medical/surgical history reviewed  Pertinent family history reviewed  Social History     Tobacco Use   ??? Smoking status: Never Smoker   ??? Smokeless tobacco: Never Used   Substance Use Topics ??? Alcohol use: No   ??? Drug use: No     Social History     Substance and Sexual Activity   Drug Use No     Allergies:  Contrast dye iv, iodine containing [iodinated contrast- oral and iv dye]; Sulfa (sulfonamide antibiotics); Codeine; Morphine; Penicillin g; and Tramadol  Medications  Current Facility-Administered Medications   Medication   ??? lactated ringers infusion     Review of Systems:  A 14 point review of systems was negative except for: as mentioned in HPI           Physical Exam:  Respirations: (P) 14 PER MINUTE (12/21 0759)  BP: (P) 131/58 (12/21 0759)  General appearance: alert and well-developed  Throat: Lips, mucosa, and tongue normal. Teeth and gums normal  Lungs: clear to auscultation bilaterally  Heart: regular rate  and rhythm, S1, S2 normal, no murmur, click, rub or gallop  Abdomen: soft, non-tender. Bowel sounds normal. No masses,  no organomegaly  Extremities: extremities normal, atraumatic, no cyanosis or edema  Airway:  airway assessment performed  Mallampati II (soft palate, uvula, fauces visible)  Anesthesia Classification:  ASA III (A patient with a severe systemic disease that limits activity, but is not incapacitating)  NPO Status: Acceptable  Pregnancy Status: Not Pregnant    Lab/Radiology/Other Diagnostic Tests  Labs:    Hematology:    Lab Results   Component Value Date    HGB 10.0 10/28/2017    HCT 33.0 10/28/2017    PLTCT 169 10/28/2017    WBC 5.7 10/28/2017    NEUT 54 10/28/2017    ANC 3.1 10/28/2017    LYMPH 32 10/28/2017    ALC 1.8 10/28/2017    RBC 4.31 10/28/2017    MONA 9 08/27/2017    AMC 0.5 10/28/2017    EOSA 2 08/27/2017    ABC 0.1 10/28/2017    BASOPHILS 1 10/28/2017    MCV 77 10/28/2017    MCH 23.2 10/28/2017    MCHC 30.3 10/28/2017    MPV 10.4 08/27/2017    RDW 18.4 10/28/2017   , Coagulation:    Lab Results   Component Value Date    PT 12.1 02/09/2015    PTT 26.5 04/28/2012    INR 1.2 08/27/2017    and General Chemistry:    Lab Results   Component Value Date NA 136 10/03/2017    K 5.0 10/03/2017    CL 97 10/03/2017    CO2 22 10/03/2017    GAP 11 08/27/2017    BUN 12 10/03/2017    CR 0.88 10/03/2017    GLU 350 10/03/2017    CA 9.3 10/03/2017    ALBUMIN 4.0 08/27/2017    TOTBILI 1.7 08/27/2017          Daphane Shepherd, MBBS  Pager [phone]

## 2017-11-08 NOTE — Anesthesia Post-Procedure Evaluation
Post-Anesthesia Evaluation    Name: Kristin Moody      MRN: [id number]     DOB: 14-Apr-1956     Age: 61 y.o.     Sex: female   __________________________________________________________________________     Procedure Date: 11/08/2017  Procedure: Procedure(s):  COLONOSCOPY  ESOPHAGOGASTRODUODENOSCOPY  ESOPHAGOGASTRODUODENOSCOPY WITH BIOPSY - FLEXIBLE  COLONOSCOPY WITH HOT BIOPSY FORCEPS REMOVAL TUMOR/ POLYP/ OTHER LESION      Surgeon: Surgeon(s):  Felicita Gage, MD  Jackelyn Hoehn, Vivek, MBBS    Post-Anesthesia Vitals  BP: 119/63 (12/21 1015)  Temp: 36.4 C (97.5 F) (12/21 0947)  Pulse: 83 (12/21 1015)  Respirations: 18 PER MINUTE (12/21 1000)  SpO2: 95 % (12/21 1015)  O2 Delivery: None (Room Air) (12/21 1000)  SpO2 Pulse: 83 (12/21 1015)      Post Anesthesia Evaluation Note    Evaluation location: Pre/Post  Patient participation: recovered; patient participated in evaluation  Level of consciousness: alert    Pain score: 1  Pain management: adequate    Hydration: normovolemia  Temperature: 36.0C - 38.4C  Airway patency: adequate    Perioperative Events  Perioperative events:  no       Post-op nausea and vomiting: no PONV    Postoperative Status  Cardiovascular status: hemodynamically stable  Respiratory status: spontaneous ventilation  Follow-up needed: none  Additional comments: Patient ready for discharge to home with family        Perioperative Events  Perioperative Event: No  Emergency Case Activation: No

## 2017-11-09 ENCOUNTER — Encounter: Admit: 2017-11-09 | Discharge: 2017-11-09 | Payer: MEDICARE

## 2017-11-09 DIAGNOSIS — J329 Chronic sinusitis, unspecified: ICD-10-CM

## 2017-11-09 DIAGNOSIS — E669 Obesity, unspecified: Secondary | ICD-10-CM

## 2017-11-09 DIAGNOSIS — R0600 Dyspnea, unspecified: ICD-10-CM

## 2017-11-09 DIAGNOSIS — I10 Essential (primary) hypertension: ICD-10-CM

## 2017-11-09 DIAGNOSIS — K760 Fatty (change of) liver, not elsewhere classified: ICD-10-CM

## 2017-11-09 DIAGNOSIS — K5730 Diverticulosis of large intestine without perforation or abscess without bleeding: ICD-10-CM

## 2017-11-09 DIAGNOSIS — E119 Type 2 diabetes mellitus without complications: ICD-10-CM

## 2017-11-09 DIAGNOSIS — R109 Unspecified abdominal pain: ICD-10-CM

## 2017-11-09 DIAGNOSIS — R748 Abnormal levels of other serum enzymes: ICD-10-CM

## 2017-11-09 DIAGNOSIS — R945 Abnormal results of liver function studies: Principal | ICD-10-CM

## 2017-11-26 ENCOUNTER — Encounter: Admit: 2017-11-26 | Discharge: 2017-11-26 | Payer: MEDICARE

## 2017-12-26 ENCOUNTER — Encounter: Admit: 2017-12-26 | Discharge: 2017-12-26 | Payer: MEDICARE

## 2018-02-18 ENCOUNTER — Encounter: Admit: 2018-02-18 | Discharge: 2018-02-18 | Payer: MEDICARE

## 2018-02-20 ENCOUNTER — Encounter: Admit: 2018-02-20 | Discharge: 2018-02-20 | Payer: MEDICARE

## 2018-02-20 DIAGNOSIS — R945 Abnormal results of liver function studies: Principal | ICD-10-CM

## 2018-02-20 DIAGNOSIS — R109 Unspecified abdominal pain: ICD-10-CM

## 2018-02-20 DIAGNOSIS — R748 Abnormal levels of other serum enzymes: ICD-10-CM

## 2018-02-20 DIAGNOSIS — E669 Obesity, unspecified: Secondary | ICD-10-CM

## 2018-02-20 DIAGNOSIS — K5730 Diverticulosis of large intestine without perforation or abscess without bleeding: ICD-10-CM

## 2018-02-20 DIAGNOSIS — R0600 Dyspnea, unspecified: ICD-10-CM

## 2018-02-20 DIAGNOSIS — J329 Chronic sinusitis, unspecified: ICD-10-CM

## 2018-02-20 DIAGNOSIS — E119 Type 2 diabetes mellitus without complications: ICD-10-CM

## 2018-02-20 DIAGNOSIS — I10 Essential (primary) hypertension: ICD-10-CM

## 2018-02-20 DIAGNOSIS — K760 Fatty (change of) liver, not elsewhere classified: ICD-10-CM

## 2018-02-25 ENCOUNTER — Encounter: Admit: 2018-02-25 | Discharge: 2018-02-25 | Payer: MEDICARE

## 2018-02-25 ENCOUNTER — Ambulatory Visit: Admit: 2018-02-25 | Discharge: 2018-02-25 | Payer: MEDICARE

## 2018-02-25 ENCOUNTER — Ambulatory Visit: Admit: 2018-02-25 | Discharge: 2018-02-26 | Payer: MEDICARE

## 2018-02-25 DIAGNOSIS — K760 Fatty (change of) liver, not elsewhere classified: Principal | ICD-10-CM

## 2018-02-25 DIAGNOSIS — I864 Gastric varices: ICD-10-CM

## 2018-02-25 DIAGNOSIS — R0600 Dyspnea, unspecified: ICD-10-CM

## 2018-02-25 DIAGNOSIS — E1121 Type 2 diabetes mellitus with diabetic nephropathy: ICD-10-CM

## 2018-02-25 DIAGNOSIS — K740 Hepatic fibrosis: ICD-10-CM

## 2018-02-25 DIAGNOSIS — K5730 Diverticulosis of large intestine without perforation or abscess without bleeding: ICD-10-CM

## 2018-02-25 DIAGNOSIS — E118 Type 2 diabetes mellitus with unspecified complications: ICD-10-CM

## 2018-02-25 DIAGNOSIS — E119 Type 2 diabetes mellitus without complications: ICD-10-CM

## 2018-02-25 DIAGNOSIS — R945 Abnormal results of liver function studies: Principal | ICD-10-CM

## 2018-02-25 DIAGNOSIS — E1165 Type 2 diabetes mellitus with hyperglycemia: ICD-10-CM

## 2018-02-25 DIAGNOSIS — I10 Essential (primary) hypertension: Secondary | ICD-10-CM

## 2018-02-25 DIAGNOSIS — R748 Abnormal levels of other serum enzymes: ICD-10-CM

## 2018-02-25 DIAGNOSIS — R109 Unspecified abdominal pain: ICD-10-CM

## 2018-02-25 DIAGNOSIS — E669 Obesity, unspecified: Secondary | ICD-10-CM

## 2018-02-25 DIAGNOSIS — J329 Chronic sinusitis, unspecified: ICD-10-CM

## 2018-02-25 LAB — CBC AND DIFF
Lab: 1 % (ref >60)
Lab: 11 % (ref 4–12)
Lab: 2 % (ref >60)
Lab: 22 pg — ABNORMAL LOW (ref 26–34)
Lab: 25 % — ABNORMAL LOW (ref 36–45)
Lab: 3.5 M/UL — ABNORMAL LOW (ref 4.0–5.0)
Lab: 3.8 10*3/uL (ref 1.8–7.0)
Lab: 6.7 10*3/uL (ref 4.5–11.0)

## 2018-02-25 LAB — LIPID PROFILE
Lab: 11 mg/dL (ref 41–77)
Lab: 42 mg/dL — ABNORMAL HIGH (ref >40)
Lab: 50 mg/dL — ABNORMAL HIGH (ref <100)
Lab: 56 mg/dL (ref 24–44)
Lab: 57 mg/dL — ABNORMAL HIGH (ref <150)
Lab: 98 mg/dL — ABNORMAL LOW (ref <200)

## 2018-02-25 LAB — COMPREHENSIVE METABOLIC PANEL
Lab: 139 MMOL/L (ref 137–147)
Lab: 3.5 MMOL/L (ref 3.5–5.1)

## 2018-02-25 LAB — ALPHA FETO PROTEIN (AFP): Lab: 4.3 ng/mL — ABNORMAL LOW (ref 0.0–15.0)

## 2018-02-25 LAB — PROTIME INR (PT): Lab: 1.2 (ref 0.8–1.2)

## 2018-02-25 LAB — HEMOGLOBIN A1C: Lab: 6.3 % — ABNORMAL HIGH (ref 4.0–6.0)

## 2018-03-11 ENCOUNTER — Encounter: Admit: 2018-03-11 | Discharge: 2018-03-11 | Payer: MEDICARE

## 2018-03-11 MED ORDER — NITROFURANTOIN MONOHYD/M-CRYST 100 MG PO CAP
100 mg | ORAL_CAPSULE | Freq: Two times a day (BID) | ORAL | 0 refills | 7.00000 days | Status: AC
Start: 2018-03-11 — End: ?

## 2018-03-11 MED ORDER — CEPHALEXIN 500 MG PO CAP
500 mg | ORAL_CAPSULE | Freq: Two times a day (BID) | ORAL | 0 refills | Status: AC
Start: 2018-03-11 — End: 2018-03-12

## 2018-03-11 MED ORDER — PANTOPRAZOLE 40 MG IV SOLR
40 mg | Freq: Once | INTRAVENOUS | 0 refills | Status: DC
Start: 2018-03-11 — End: 2018-03-12

## 2018-03-12 ENCOUNTER — Emergency Department: Admit: 2018-03-12 | Discharge: 2018-03-12 | Disposition: A | Discharge status: Home / Self Care | Payer: MEDICARE

## 2018-03-12 DIAGNOSIS — N390 Urinary tract infection, site not specified: ICD-10-CM

## 2018-03-12 DIAGNOSIS — D649 Anemia, unspecified: Principal | ICD-10-CM

## 2018-03-12 DIAGNOSIS — R109 Unspecified abdominal pain: ICD-10-CM

## 2018-03-12 LAB — PTT (APTT): Lab: 29 s — ABNORMAL LOW (ref 24.0–36.5)

## 2018-03-12 LAB — URINALYSIS, MICROSCOPIC

## 2018-03-12 LAB — LIPASE: Lab: 55 U/L — ABNORMAL LOW (ref 11–82)

## 2018-03-12 LAB — CBC AND DIFF: Lab: 6.9 10*3/uL (ref 4.5–11.0)

## 2018-03-12 LAB — COMPREHENSIVE METABOLIC PANEL: Lab: 138 MMOL/L — ABNORMAL LOW (ref 137–147)

## 2018-03-12 LAB — URINALYSIS DIPSTICK

## 2018-03-12 LAB — PROTIME INR (PT): Lab: 1.2 MMOL/L — ABNORMAL LOW (ref 0.8–1.2)

## 2018-03-13 LAB — CULTURE-URINE W/SENSITIVITY: Lab: 3

## 2018-04-09 ENCOUNTER — Emergency Department: Admit: 2018-04-09 | Discharge: 2018-04-09 | Payer: MEDICARE

## 2018-04-09 ENCOUNTER — Ambulatory Visit: Admit: 2018-04-09 | Discharge: 2018-04-09 | Payer: MEDICARE

## 2018-04-09 ENCOUNTER — Encounter: Admit: 2018-04-09 | Discharge: 2018-04-09 | Payer: MEDICARE

## 2018-04-09 DIAGNOSIS — K5901 Slow transit constipation: ICD-10-CM

## 2018-04-09 DIAGNOSIS — R945 Abnormal results of liver function studies: ICD-10-CM

## 2018-04-09 DIAGNOSIS — E119 Type 2 diabetes mellitus without complications: ICD-10-CM

## 2018-04-09 DIAGNOSIS — K760 Fatty (change of) liver, not elsewhere classified: ICD-10-CM

## 2018-04-09 DIAGNOSIS — I10 Essential (primary) hypertension: ICD-10-CM

## 2018-04-09 DIAGNOSIS — E669 Obesity, unspecified: ICD-10-CM

## 2018-04-09 DIAGNOSIS — R748 Abnormal levels of other serum enzymes: ICD-10-CM

## 2018-04-09 DIAGNOSIS — J329 Chronic sinusitis, unspecified: ICD-10-CM

## 2018-04-09 DIAGNOSIS — I8500 Esophageal varices without bleeding: ICD-10-CM

## 2018-04-09 DIAGNOSIS — K5730 Diverticulosis of large intestine without perforation or abscess without bleeding: ICD-10-CM

## 2018-04-09 DIAGNOSIS — R109 Unspecified abdominal pain: ICD-10-CM

## 2018-04-09 DIAGNOSIS — R0600 Dyspnea, unspecified: ICD-10-CM

## 2018-04-09 MED ORDER — PANTOPRAZOLE 40 MG IV SOLR
80 mg | Freq: Once | INTRAVENOUS | 0 refills | Status: CP
Start: 2018-04-09 — End: ?
  Administered 2018-04-10: 04:00:00 80 mg via INTRAVENOUS

## 2018-04-09 MED ORDER — OCTREOTIDE IV DRIP
50 ug/h | INTRAVENOUS | 0 refills | Status: DC
Start: 2018-04-09 — End: 2018-04-11
  Administered 2018-04-10 – 2018-04-11 (×8): 50 ug/h via INTRAVENOUS

## 2018-04-09 MED ORDER — OCTREOTIDE (SANDOSTATIN) BOLUS FOR CONTINUOUS INFUSION
50 ug | Freq: Once | INTRAVENOUS | 0 refills | Status: CP
Start: 2018-04-09 — End: ?

## 2018-04-10 ENCOUNTER — Encounter: Admit: 2018-04-10 | Discharge: 2018-04-10 | Payer: MEDICARE

## 2018-04-10 ENCOUNTER — Inpatient Hospital Stay: Admit: 2018-04-10 | Discharge: 2018-04-10 | Payer: MEDICARE

## 2018-04-10 ENCOUNTER — Inpatient Hospital Stay: Admit: 2018-04-10 | Discharge: 2018-04-11 | Payer: MEDICARE

## 2018-04-10 DIAGNOSIS — R748 Abnormal levels of other serum enzymes: ICD-10-CM

## 2018-04-10 DIAGNOSIS — K7581 Nonalcoholic steatohepatitis (NASH): Principal | ICD-10-CM

## 2018-04-10 DIAGNOSIS — R109 Unspecified abdominal pain: ICD-10-CM

## 2018-04-10 DIAGNOSIS — E119 Type 2 diabetes mellitus without complications: ICD-10-CM

## 2018-04-10 DIAGNOSIS — K5730 Diverticulosis of large intestine without perforation or abscess without bleeding: ICD-10-CM

## 2018-04-10 DIAGNOSIS — K760 Fatty (change of) liver, not elsewhere classified: ICD-10-CM

## 2018-04-10 DIAGNOSIS — E669 Obesity, unspecified: Secondary | ICD-10-CM

## 2018-04-10 DIAGNOSIS — R945 Abnormal results of liver function studies: Principal | ICD-10-CM

## 2018-04-10 DIAGNOSIS — I10 Essential (primary) hypertension: Secondary | ICD-10-CM

## 2018-04-10 DIAGNOSIS — J329 Chronic sinusitis, unspecified: ICD-10-CM

## 2018-04-10 DIAGNOSIS — R0600 Dyspnea, unspecified: ICD-10-CM

## 2018-04-10 LAB — POC GLUCOSE
Lab: 109 mg/dL — ABNORMAL HIGH (ref 70–100)
Lab: 134 mg/dL — ABNORMAL HIGH (ref 70–100)
Lab: 130 mg/dL — ABNORMAL HIGH (ref 70–100)
Lab: 132 mg/dL — ABNORMAL HIGH (ref 70–100)
Lab: 133 mg/dL — ABNORMAL HIGH (ref 70–100)

## 2018-04-10 LAB — CBC
Lab: 164 10*3/uL (ref >60)
Lab: 20 % — ABNORMAL HIGH (ref >60)
Lab: 22 pg — ABNORMAL LOW (ref 26–34)
Lab: 25 % — ABNORMAL LOW (ref 36–45)
Lab: 3.6 M/UL — ABNORMAL LOW (ref 4.0–5.0)
Lab: 31 g/dL — ABNORMAL LOW (ref 32.0–36.0)
Lab: 5.9 K/UL — ABNORMAL HIGH (ref 4.5–11.0)
Lab: 71 FL — ABNORMAL LOW (ref 80–100)
Lab: 8.3 g/dL — ABNORMAL LOW (ref 12.0–15.0)
Lab: 9.9 FL (ref 7–11)

## 2018-04-10 LAB — COMPREHENSIVE METABOLIC PANEL
Lab: 0.7 mg/dL — ABNORMAL LOW (ref 0.4–1.00)
Lab: 1.6 mg/dL — ABNORMAL HIGH (ref 0.3–1.2)
Lab: 104 MMOL/L — ABNORMAL LOW (ref 98–110)
Lab: 133 U/L — ABNORMAL HIGH (ref 25–110)
Lab: 139 MMOL/L — ABNORMAL LOW (ref 137–147)
Lab: 16 U/L (ref 7–56)
Lab: 16 mg/dL — ABNORMAL LOW (ref 7–25)
Lab: 27 MMOL/L (ref 21–30)
Lab: 3.4 g/dL — ABNORMAL LOW (ref 3.5–5.0)
Lab: 3.5 MMOL/L — ABNORMAL LOW (ref 3.5–5.1)
Lab: 39 U/L (ref 7–40)
Lab: 6.7 g/dL (ref 6.0–8.0)
Lab: 8 K/UL (ref 3–12)
Lab: 9.2 mg/dL — ABNORMAL HIGH (ref 8.5–10.6)
Lab: 99 mg/dL — ABNORMAL LOW (ref 70–100)
Lab: >60 mL/min (ref >60)
Lab: >60 mL/min (ref >60)
Lab: 0.7 mg/dL — ABNORMAL HIGH (ref 0.4–1.00)
Lab: 1.8 mg/dL — ABNORMAL HIGH (ref 0.3–1.2)
Lab: 102 mg/dL — ABNORMAL HIGH (ref 70–100)
Lab: 117 U/L — ABNORMAL HIGH (ref 25–110)
Lab: 138 MMOL/L — ABNORMAL LOW (ref 137–147)
Lab: 17 U/L (ref 7–56)
Lab: 26 MMOL/L (ref 21–30)
Lab: 3.1 g/dL — ABNORMAL LOW (ref 3.5–5.0)
Lab: 3.9 MMOL/L — ABNORMAL LOW (ref 3.5–5.1)
Lab: 43 U/L — ABNORMAL HIGH (ref 7–40)
Lab: 6.1 g/dL (ref 6.0–8.0)
Lab: 8 10*3/uL (ref 3–12)
Lab: 8.8 mg/dL (ref 8.5–10.6)
Lab: >60 mL/min (ref >60)
Lab: >60 mL/min (ref >60)

## 2018-04-10 LAB — CBC AND DIFF
Lab: 0.1 10*3/uL (ref 0–0.20)
Lab: 0.3 10*3/uL (ref 0–0.45)
Lab: 6.4 10*3/uL (ref 4.5–11.0)
Lab: 0 10*3/uL (ref 0–0.20)
Lab: 3.6 M/UL — ABNORMAL LOW (ref 4.0–5.0)
Lab: 6 10*3/uL (ref 4.5–11.0)

## 2018-04-10 LAB — PROTIME INR (PT)
Lab: 1.2 (ref 0.8–1.2)
Lab: 1.3 — ABNORMAL HIGH (ref 0.8–1.2)

## 2018-04-10 LAB — PHOSPHORUS: Lab: 4.3 mg/dL — ABNORMAL LOW (ref 2.0–4.5)

## 2018-04-10 LAB — IRON + BINDING CAPACITY + %SAT+ FERRITIN
Lab: 16 ng/mL (ref 10–200)
Lab: 18 ug/dL — ABNORMAL LOW (ref 50–160)
Lab: 3 % — ABNORMAL LOW (ref 28–42)
Lab: 547 ug/dL — ABNORMAL HIGH (ref 270–380)

## 2018-04-10 LAB — PTT (APTT): Lab: 29 s (ref 24.0–36.5)

## 2018-04-10 LAB — MAGNESIUM: Lab: 2.1 mg/dL — ABNORMAL LOW (ref 1.6–2.6)

## 2018-04-10 MED ORDER — LIDOCAINE (PF) 200 MG/10 ML (2 %) IJ SYRG
0 refills | Status: DC
Start: 2018-04-10 — End: 2018-04-10
  Administered 2018-04-10: 20:00:00 100 mg via INTRAVENOUS

## 2018-04-10 MED ORDER — LEVOFLOXACIN 750 MG PO TAB
750 mg | ORAL | 0 refills | Status: DC
Start: 2018-04-10 — End: 2018-04-13
  Administered 2018-04-10 – 2018-04-13 (×4): 750 mg via ORAL

## 2018-04-10 MED ORDER — CHOLECALCIFEROL (VITAMIN D3) 1,000 UNIT PO TAB
1000 [IU] | Freq: Every day | ORAL | 0 refills | Status: DC
Start: 2018-04-10 — End: 2018-04-13
  Administered 2018-04-10 – 2018-04-13 (×4): 1000 [IU] via ORAL

## 2018-04-10 MED ORDER — INSULIN ASPART 100 UNIT/ML SC FLEXPEN
0-7 [IU] | Freq: Every day | SUBCUTANEOUS | 0 refills | Status: DC
Start: 2018-04-10 — End: 2018-04-13
  Administered 2018-04-11: 03:00:00 3 [IU] via SUBCUTANEOUS

## 2018-04-10 MED ORDER — POLYETHYLENE GLYCOL 3350 17 GRAM PO PWPK
17 g | Freq: Every day | ORAL | 0 refills | Status: DC
Start: 2018-04-10 — End: 2018-04-13
  Administered 2018-04-11 – 2018-04-13 (×3): 17 g via ORAL

## 2018-04-10 MED ORDER — LACTATED RINGERS IV SOLP
INTRAVENOUS | 0 refills | Status: DC
Start: 2018-04-10 — End: 2018-04-11
  Administered 2018-04-10: 19:00:00 1000.000 mL via INTRAVENOUS

## 2018-04-10 MED ORDER — ATORVASTATIN 10 MG PO TAB
10 mg | Freq: Every day | ORAL | 0 refills | Status: DC
Start: 2018-04-10 — End: 2018-04-13
  Administered 2018-04-10 – 2018-04-13 (×4): 10 mg via ORAL

## 2018-04-10 MED ORDER — FAMOTIDINE 20 MG PO TAB
20 mg | Freq: Two times a day (BID) | ORAL | 0 refills | Status: DC
Start: 2018-04-10 — End: 2018-04-13
  Administered 2018-04-10 – 2018-04-13 (×5): 20 mg via ORAL

## 2018-04-10 MED ORDER — SODIUM CHLORIDE 0.9 % IV SOLP
INTRAVENOUS | 0 refills | Status: CN
Start: 2018-04-10 — End: ?

## 2018-04-10 MED ORDER — PROPOFOL INJ 10 MG/ML IV VIAL
0 refills | Status: DC
Start: 2018-04-10 — End: 2018-04-10
  Administered 2018-04-10 (×3): 30 mg via INTRAVENOUS
  Administered 2018-04-10: 20:00:00 50 mg via INTRAVENOUS

## 2018-04-10 MED ORDER — ONDANSETRON HCL (PF) 4 MG/2 ML IJ SOLN
4 mg | INTRAVENOUS | 0 refills | Status: DC | PRN
Start: 2018-04-10 — End: 2018-04-13
  Administered 2018-04-10: 14:00:00 4 mg via INTRAVENOUS

## 2018-04-10 MED ORDER — MELATONIN 5 MG PO TAB
5 mg | Freq: Every evening | ORAL | 0 refills | Status: DC | PRN
Start: 2018-04-10 — End: 2018-04-13

## 2018-04-10 MED ORDER — PROPOFOL 10 MG/ML IV EMUL 20 ML (INFUSION)(AM)(OR)
INTRAVENOUS | 0 refills | Status: DC
Start: 2018-04-10 — End: 2018-04-10
  Administered 2018-04-10: 20:00:00 100 ug/kg/min via INTRAVENOUS

## 2018-04-10 MED ORDER — ONDANSETRON HCL (PF) 4 MG/2 ML IJ SOLN
4 mg | Freq: Once | INTRAVENOUS | 0 refills | Status: CN | PRN
Start: 2018-04-10 — End: ?

## 2018-04-10 MED ORDER — FENTANYL CITRATE (PF) 50 MCG/ML IJ SOLN
0 refills | Status: DC
Start: 2018-04-10 — End: 2018-04-10
  Administered 2018-04-10 (×2): 25 ug via INTRAVENOUS
  Administered 2018-04-10: 20:00:00 50 ug via INTRAVENOUS

## 2018-04-10 MED ORDER — CARVEDILOL 12.5 MG PO TAB
12.5 mg | Freq: Two times a day (BID) | ORAL | 0 refills | Status: DC
Start: 2018-04-10 — End: 2018-04-12
  Administered 2018-04-10 – 2018-04-12 (×5): 12.5 mg via ORAL

## 2018-04-10 MED ORDER — ACETAMINOPHEN 500 MG PO TAB
1000 mg | ORAL | 0 refills | Status: DC | PRN
Start: 2018-04-10 — End: 2018-04-13
  Administered 2018-04-10 – 2018-04-11 (×2): 1000 mg via ORAL

## 2018-04-10 MED ORDER — DULOXETINE 60 MG PO CPDR
60 mg | Freq: Every day | ORAL | 0 refills | Status: DC
Start: 2018-04-10 — End: 2018-04-13
  Administered 2018-04-10 – 2018-04-13 (×4): 60 mg via ORAL

## 2018-04-10 MED ORDER — PANTOPRAZOLE 40 MG IV SOLR
40 mg | Freq: Two times a day (BID) | INTRAVENOUS | 0 refills | Status: DC
Start: 2018-04-10 — End: 2018-04-11
  Administered 2018-04-10 – 2018-04-11 (×3): 40 mg via INTRAVENOUS

## 2018-04-10 MED ORDER — PROCHLORPERAZINE EDISYLATE 5 MG/ML IJ SOLN
10 mg | INTRAVENOUS | 0 refills | Status: DC | PRN
Start: 2018-04-10 — End: 2018-04-13

## 2018-04-11 ENCOUNTER — Encounter: Admit: 2018-04-11 | Discharge: 2018-04-11 | Payer: MEDICARE

## 2018-04-11 DIAGNOSIS — R109 Unspecified abdominal pain: ICD-10-CM

## 2018-04-11 DIAGNOSIS — E669 Obesity, unspecified: Secondary | ICD-10-CM

## 2018-04-11 DIAGNOSIS — E119 Type 2 diabetes mellitus without complications: ICD-10-CM

## 2018-04-11 DIAGNOSIS — I10 Essential (primary) hypertension: Secondary | ICD-10-CM

## 2018-04-11 DIAGNOSIS — R0600 Dyspnea, unspecified: ICD-10-CM

## 2018-04-11 DIAGNOSIS — R748 Abnormal levels of other serum enzymes: ICD-10-CM

## 2018-04-11 DIAGNOSIS — R945 Abnormal results of liver function studies: Principal | ICD-10-CM

## 2018-04-11 DIAGNOSIS — J329 Chronic sinusitis, unspecified: ICD-10-CM

## 2018-04-11 DIAGNOSIS — K5730 Diverticulosis of large intestine without perforation or abscess without bleeding: ICD-10-CM

## 2018-04-11 DIAGNOSIS — K760 Fatty (change of) liver, not elsewhere classified: ICD-10-CM

## 2018-04-11 LAB — POC GLUCOSE
Lab: 267 mg/dL — ABNORMAL HIGH (ref 70–100)
Lab: 135 mg/dL — ABNORMAL HIGH (ref 70–100)
Lab: 150 mg/dL — ABNORMAL HIGH (ref 70–100)
Lab: 199 mg/dL — ABNORMAL HIGH (ref 70–100)
Lab: 150 mg/dL — ABNORMAL HIGH (ref 70–100)

## 2018-04-11 LAB — COMPREHENSIVE METABOLIC PANEL: Lab: 140 MMOL/L — ABNORMAL LOW (ref 137–147)

## 2018-04-11 LAB — PROTIME INR (PT): Lab: 1.3 MMOL/L — ABNORMAL HIGH (ref 0.8–1.2)

## 2018-04-11 LAB — CBC AND DIFF: Lab: 5.2 K/UL — ABNORMAL LOW (ref >60)

## 2018-04-11 LAB — MAGNESIUM: Lab: 2.2 mg/dL — ABNORMAL LOW (ref >60)

## 2018-04-11 LAB — PHOSPHORUS: Lab: 4.3 mg/dL — ABNORMAL HIGH (ref >60)

## 2018-04-11 MED ORDER — IRON SUCROSE 300 MG IRON/15 ML IV SOLN
300 mg | INTRAVENOUS | 0 refills | Status: CP
Start: 2018-04-11 — End: ?
  Administered 2018-04-12 – 2018-04-13 (×3): 300 mg via INTRAVENOUS

## 2018-04-11 MED ORDER — DIPHENHYDRAMINE HCL 50 MG/ML IJ SOLN
25 mg | INTRAVENOUS | 0 refills | Status: DC | PRN
Start: 2018-04-11 — End: 2018-04-13

## 2018-04-11 MED ORDER — PANTOPRAZOLE 40 MG PO TBEC
40 mg | Freq: Every day | ORAL | 0 refills | Status: DC
Start: 2018-04-11 — End: 2018-04-13
  Administered 2018-04-12 – 2018-04-13 (×2): 40 mg via ORAL

## 2018-04-11 MED ORDER — EPINEPHRINE HCL (PF) 1 MG/ML (1 ML) IJ SOLN
.3-.5 mg | INTRAMUSCULAR | 0 refills | Status: DC | PRN
Start: 2018-04-11 — End: 2018-04-13

## 2018-04-12 LAB — COMPREHENSIVE METABOLIC PANEL: Lab: 140 MMOL/L — ABNORMAL LOW (ref 137–147)

## 2018-04-12 LAB — MAGNESIUM: Lab: 2.2 mg/dL — ABNORMAL LOW (ref 1.6–2.6)

## 2018-04-12 LAB — PROTIME INR (PT): Lab: 1.3 MMOL/L — ABNORMAL HIGH (ref >60)

## 2018-04-12 LAB — PHOSPHORUS: Lab: 3.6 mg/dL — ABNORMAL LOW (ref >60)

## 2018-04-12 LAB — POC GLUCOSE
Lab: 132 mg/dL — ABNORMAL HIGH (ref 70–100)
Lab: 134 mg/dL — ABNORMAL HIGH (ref 70–100)
Lab: 212 mg/dL — ABNORMAL HIGH (ref 70–100)
Lab: 101 mg/dL — ABNORMAL HIGH (ref 70–100)

## 2018-04-12 LAB — CBC AND DIFF: Lab: 6.9 K/UL — ABNORMAL HIGH (ref 4.5–11.0)

## 2018-04-12 MED ORDER — CARVEDILOL 12.5 MG PO TAB
25 mg | Freq: Two times a day (BID) | ORAL | 0 refills | Status: DC
Start: 2018-04-12 — End: 2018-04-13
  Administered 2018-04-12 – 2018-04-13 (×2): 25 mg via ORAL

## 2018-04-12 MED ORDER — MICONAZOLE NITRATE 2 % VA CREA
1 | Freq: Every evening | VAGINAL | 0 refills | Status: DC
Start: 2018-04-12 — End: 2018-04-13
  Administered 2018-04-13: 01:00:00 1 via VAGINAL

## 2018-04-13 ENCOUNTER — Inpatient Hospital Stay: Admit: 2018-04-10 | Discharge: 2018-04-10 | Payer: MEDICARE

## 2018-04-13 ENCOUNTER — Encounter: Admit: 2018-04-13 | Discharge: 2018-04-13 | Payer: MEDICARE

## 2018-04-13 ENCOUNTER — Inpatient Hospital Stay
Admit: 2018-04-09 | Discharge: 2018-04-13 | Disposition: A | Discharge status: Home / Self Care | Payer: MEDICARE | Admitting: Student in an Organized Health Care Education/Training Program

## 2018-04-13 DIAGNOSIS — K766 Portal hypertension: ICD-10-CM

## 2018-04-13 DIAGNOSIS — E785 Hyperlipidemia, unspecified: ICD-10-CM

## 2018-04-13 DIAGNOSIS — K921 Melena: Principal | ICD-10-CM

## 2018-04-13 DIAGNOSIS — R188 Other ascites: ICD-10-CM

## 2018-04-13 DIAGNOSIS — R161 Splenomegaly, not elsewhere classified: ICD-10-CM

## 2018-04-13 DIAGNOSIS — E119 Type 2 diabetes mellitus without complications: ICD-10-CM

## 2018-04-13 DIAGNOSIS — B373 Candidiasis of vulva and vagina: ICD-10-CM

## 2018-04-13 DIAGNOSIS — D500 Iron deficiency anemia secondary to blood loss (chronic): ICD-10-CM

## 2018-04-13 DIAGNOSIS — I10 Essential (primary) hypertension: ICD-10-CM

## 2018-04-13 DIAGNOSIS — J189 Pneumonia, unspecified organism: ICD-10-CM

## 2018-04-13 DIAGNOSIS — J9811 Atelectasis: ICD-10-CM

## 2018-04-13 DIAGNOSIS — K7460 Unspecified cirrhosis of liver: ICD-10-CM

## 2018-04-13 DIAGNOSIS — E669 Obesity, unspecified: ICD-10-CM

## 2018-04-13 DIAGNOSIS — Z7984 Long term (current) use of oral hypoglycemic drugs: ICD-10-CM

## 2018-04-13 DIAGNOSIS — Z6837 Body mass index (BMI) 37.0-37.9, adult: ICD-10-CM

## 2018-04-13 LAB — POC GLUCOSE
Lab: 134 mg/dL — ABNORMAL HIGH (ref 70–100)
Lab: 128 mg/dL — ABNORMAL HIGH (ref 70–100)
Lab: 131 mg/dL — ABNORMAL HIGH (ref 70–100)
Lab: 229 mg/dL — ABNORMAL HIGH (ref 70–100)

## 2018-04-13 LAB — COMPREHENSIVE METABOLIC PANEL: Lab: 139 MMOL/L — ABNORMAL LOW (ref >60)

## 2018-04-13 LAB — PHOSPHORUS: Lab: 3.7 mg/dL — ABNORMAL LOW (ref 2.0–4.5)

## 2018-04-13 LAB — CBC AND DIFF: Lab: 5.2 K/UL — ABNORMAL LOW (ref >60)

## 2018-04-13 LAB — PROTIME INR (PT): Lab: 1.4 g/dL — ABNORMAL HIGH (ref 0.8–1.2)

## 2018-04-13 LAB — MAGNESIUM: Lab: 2.2 mg/dL — ABNORMAL LOW (ref >60)

## 2018-04-13 MED ORDER — IRON SUCROSE 300 MG IRON/15 ML IV SOLN
300 mg | Freq: Once | INTRAVENOUS | 0 refills | Status: CP
Start: 2018-04-13 — End: ?
  Administered 2018-04-13 (×2): 300 mg via INTRAVENOUS

## 2018-04-13 MED ORDER — POLYETHYLENE GLYCOL 3350 17 GRAM PO PWPK
17 g | Freq: Two times a day (BID) | ORAL | 0 refills | 18.00000 days | Status: AC
Start: 2018-04-13 — End: ?

## 2018-04-13 MED ORDER — SPIRONOLACTONE 100 MG PO TAB
150 mg | ORAL_TABLET | Freq: Every day | ORAL | 0 refills | Status: SS
Start: 2018-04-13 — End: 2018-08-28

## 2018-04-13 MED ORDER — CARVEDILOL 25 MG PO TAB
25 mg | ORAL_TABLET | Freq: Two times a day (BID) | ORAL | 0 refills | Status: SS
Start: 2018-04-13 — End: 2018-04-25

## 2018-04-13 MED ORDER — MICONAZOLE NITRATE 2 % VA CREA
Freq: Every evening | 0 refills | Status: SS
Start: 2018-04-13 — End: 2018-08-28

## 2018-04-13 MED ORDER — FERROUS SULFATE 325 MG (65 MG IRON) PO TAB
325 mg | Freq: Every day | ORAL | 0 refills | Status: AC
Start: 2018-04-13 — End: 2018-04-25

## 2018-04-13 MED ORDER — FUROSEMIDE 40 MG PO TAB
60 mg | ORAL_TABLET | Freq: Every morning | ORAL | 0 refills | 90.00000 days | Status: AC
Start: 2018-04-13 — End: 2018-04-13

## 2018-04-13 MED ORDER — FUROSEMIDE 40 MG PO TAB
60 mg | ORAL_TABLET | Freq: Every day | ORAL | 0 refills | 90.00000 days | Status: AC
Start: 2018-04-13 — End: 2019-04-01
  Filled 2018-04-13 (×2): qty 45, 30d supply, fill #1

## 2018-04-13 MED ORDER — LEVOFLOXACIN 750 MG PO TAB
750 mg | ORAL_TABLET | ORAL | 0 refills | Status: SS
Start: 2018-04-13 — End: 2018-04-24

## 2018-04-13 MED FILL — CARVEDILOL 25 MG PO TAB: 25 mg | ORAL | 30 days supply | Qty: 60 | Fill #1 | Status: CP

## 2018-04-13 MED FILL — SPIRONOLACTONE 100 MG PO TAB: 100 mg | ORAL | 40 days supply | Qty: 60 | Fill #1 | Status: CP

## 2018-04-13 MED FILL — LEVOFLOXACIN 750 MG PO TAB: 750 mg | ORAL | 3 days supply | Qty: 3 | Fill #1 | Status: CP

## 2018-04-15 ENCOUNTER — Encounter: Admit: 2018-04-15 | Discharge: 2018-04-15 | Payer: MEDICARE

## 2018-04-15 DIAGNOSIS — K7290 Hepatic failure, unspecified without coma: Principal | ICD-10-CM

## 2018-04-16 ENCOUNTER — Encounter: Admit: 2018-04-16 | Discharge: 2018-04-16 | Payer: MEDICARE

## 2018-04-16 DIAGNOSIS — K7581 Nonalcoholic steatohepatitis (NASH): Principal | ICD-10-CM

## 2018-04-16 LAB — COMPREHENSIVE METABOLIC PANEL
Lab: 0.9 mg/dL (ref 0.57–1.11)
Lab: 1.5 mg/dL — ABNORMAL HIGH (ref 0.0–1.0)
Lab: 105 mmol/L (ref 98–107)
Lab: 122 mg/dL — ABNORMAL HIGH (ref 80–115)
Lab: 142 mmol/L (ref 136–145)
Lab: 150 U/L (ref 40–150)
Lab: 16 meq/L — ABNORMAL HIGH (ref 0–14)
Lab: 21 U/L (ref 0–55)
Lab: 25 mmol/L (ref 23–31)
Lab: 3 g/dL — ABNORMAL LOW (ref 3.5–5.0)
Lab: 4.2 mmol/L (ref 3.5–5.1)
Lab: 45 U/L — ABNORMAL HIGH (ref 5–34)
Lab: 64 mL/min/{1.73_m2} (ref >59)
Lab: 7 mg/dL — ABNORMAL LOW (ref 9.8–20.1)
Lab: 7.2 g/dL (ref 6.2–8.1)
Lab: 9.2 mg/dL (ref 8.4–10.2)

## 2018-04-18 ENCOUNTER — Encounter: Admit: 2018-04-18 | Discharge: 2018-04-18 | Payer: MEDICARE

## 2018-04-18 DIAGNOSIS — K7290 Hepatic failure, unspecified without coma: Principal | ICD-10-CM

## 2018-04-18 DIAGNOSIS — K7581 Nonalcoholic steatohepatitis (NASH): ICD-10-CM

## 2018-04-22 ENCOUNTER — Encounter: Admit: 2018-04-22 | Discharge: 2018-04-22 | Payer: MEDICARE

## 2018-04-22 DIAGNOSIS — K7581 Nonalcoholic steatohepatitis (NASH): ICD-10-CM

## 2018-04-22 DIAGNOSIS — K7460 Unspecified cirrhosis of liver: ICD-10-CM

## 2018-04-22 DIAGNOSIS — K7290 Hepatic failure, unspecified without coma: Principal | ICD-10-CM

## 2018-04-22 LAB — PROTIME INR (PT)
Lab: 1.2 % (ref 11.0–15.0)
Lab: 13 s — ABNORMAL HIGH (ref 9.9–12.1)

## 2018-04-23 ENCOUNTER — Encounter: Admit: 2018-04-23 | Discharge: 2018-04-23 | Payer: MEDICARE

## 2018-04-23 ENCOUNTER — Ambulatory Visit: Admit: 2018-04-23 | Discharge: 2018-04-23 | Payer: MEDICARE

## 2018-04-23 ENCOUNTER — Emergency Department: Admit: 2018-04-23 | Discharge: 2018-04-23 | Payer: MEDICARE

## 2018-04-23 DIAGNOSIS — R0600 Dyspnea, unspecified: ICD-10-CM

## 2018-04-23 DIAGNOSIS — I10 Essential (primary) hypertension: ICD-10-CM

## 2018-04-23 DIAGNOSIS — K7290 Hepatic failure, unspecified without coma: Principal | ICD-10-CM

## 2018-04-23 DIAGNOSIS — R109 Unspecified abdominal pain: ICD-10-CM

## 2018-04-23 DIAGNOSIS — J329 Chronic sinusitis, unspecified: ICD-10-CM

## 2018-04-23 DIAGNOSIS — E119 Type 2 diabetes mellitus without complications: ICD-10-CM

## 2018-04-23 DIAGNOSIS — K5730 Diverticulosis of large intestine without perforation or abscess without bleeding: ICD-10-CM

## 2018-04-23 DIAGNOSIS — K760 Fatty (change of) liver, not elsewhere classified: ICD-10-CM

## 2018-04-23 DIAGNOSIS — R945 Abnormal results of liver function studies: Principal | ICD-10-CM

## 2018-04-23 DIAGNOSIS — R748 Abnormal levels of other serum enzymes: ICD-10-CM

## 2018-04-23 DIAGNOSIS — K7460 Unspecified cirrhosis of liver: ICD-10-CM

## 2018-04-23 DIAGNOSIS — K254 Chronic or unspecified gastric ulcer with hemorrhage: Principal | ICD-10-CM

## 2018-04-23 DIAGNOSIS — E669 Obesity, unspecified: Secondary | ICD-10-CM

## 2018-04-23 DIAGNOSIS — K7581 Nonalcoholic steatohepatitis (NASH): ICD-10-CM

## 2018-04-23 LAB — CBC AND DIFF
Lab: 1.7 (ref 0.0–2.0)
Lab: 4.6 (ref 0.0–6.0)
Lab: 0.1 10*3/uL (ref 0.0–0.2)
Lab: 0.4 (ref 0.0–0.6)
Lab: 0.7 10*3/uL (ref 0.1–0.9)
Lab: 1.5 10*3/uL (ref 0.9–5.1)
Lab: 10 g/dL — ABNORMAL LOW (ref 12.0–16.0)
Lab: 141 10*3/uL (ref 130–400)
Lab: 18 (ref 18.0–47.0)
Lab: 24 pg — ABNORMAL LOW (ref 27.0–31.0)
Lab: 28 % — ABNORMAL HIGH (ref 11.5–14.5)
Lab: 29 g/dL — ABNORMAL LOW (ref 33.0–37.0)
Lab: 35 % — ABNORMAL LOW (ref 37.0–47.0)
Lab: 4.2 10*6/uL (ref 4.20–5.40)
Lab: 65 (ref 40.0–75.0)
Lab: 8 10*3/uL (ref 4.8–10.8)
Lab: 81 fL (ref 80.0–99.0)
Lab: 9.1 (ref 0.0–10.0)

## 2018-04-23 MED ORDER — SODIUM CHLORIDE 0.9 % IV SOLP
1000 mL | INTRAVENOUS | 0 refills | Status: CP
Start: 2018-04-23 — End: ?
  Administered 2018-04-24: 02:00:00 1000 mL via INTRAVENOUS

## 2018-04-23 MED ORDER — ATORVASTATIN 10 MG PO TAB
10 mg | Freq: Every day | ORAL | 0 refills | Status: CN
Start: 2018-04-23 — End: ?

## 2018-04-23 MED ORDER — SODIUM CHLORIDE 0.9 % IV SOLP
500 mL | INTRAVENOUS | 0 refills | Status: CP
Start: 2018-04-23 — End: ?
  Administered 2018-04-24: 04:00:00 500 mL via INTRAVENOUS

## 2018-04-23 MED ORDER — SODIUM CHLORIDE 0.9 % IV SOLP
500 mL | INTRAVENOUS | 0 refills | Status: CP
Start: 2018-04-23 — End: ?
  Administered 2018-04-24: 05:00:00 500 mL via INTRAVENOUS

## 2018-04-23 MED ORDER — LACTATED RINGERS IV SOLP
500 mL | INTRAVENOUS | 0 refills | Status: DC
Start: 2018-04-23 — End: 2018-04-24

## 2018-04-23 MED ORDER — OCTREOTIDE (SANDOSTATIN) BOLUS FOR CONTINUOUS INFUSION
50 ug | Freq: Once | INTRAVENOUS | 0 refills | Status: CP
Start: 2018-04-23 — End: ?

## 2018-04-23 MED ORDER — PANTOPRAZOLE 40 MG IV SOLR
80 mg | Freq: Once | INTRAVENOUS | 0 refills | Status: CP
Start: 2018-04-23 — End: ?
  Administered 2018-04-24: 05:00:00 80 mg via INTRAVENOUS

## 2018-04-23 MED ORDER — FENTANYL CITRATE (PF) 50 MCG/ML IJ SOLN
50 ug | Freq: Once | INTRAVENOUS | 0 refills | Status: CP
Start: 2018-04-23 — End: ?
  Administered 2018-04-24: 02:00:00 50 ug via INTRAVENOUS

## 2018-04-23 MED ORDER — OCTREOTIDE IV DRIP
50 ug/h | INTRAVENOUS | 0 refills | Status: DC
Start: 2018-04-23 — End: 2018-04-24
  Administered 2018-04-24 (×4): 50 ug/h via INTRAVENOUS

## 2018-04-24 LAB — CBC AND DIFF
Lab: 0.1 10*3/uL (ref 0–0.20)
Lab: 0.2 10*3/uL (ref 0–0.45)
Lab: 0.9 K/UL — ABNORMAL HIGH (ref 0–0.80)
Lab: 1 % (ref 0–2)
Lab: 35 % — ABNORMAL LOW (ref 36–45)
Lab: 9.7 10*3/uL (ref 4.5–11.0)
Lab: 0.3 K/UL (ref 0–0.45)
Lab: 0.9 K/UL — ABNORMAL HIGH (ref 0–0.80)
Lab: 135 K/UL — ABNORMAL LOW (ref 150–400)
Lab: 3.5 M/UL — ABNORMAL LOW (ref 4.0–5.0)
Lab: 4.1 K/UL (ref 1.8–7.0)
Lab: 5 % (ref 0–5)
Lab: 52 % (ref 41–77)
Lab: 6.9 10*3/uL — ABNORMAL LOW (ref 4.5–11.0)
Lab: 8 10*3/uL (ref 4.5–11.0)
Lab: 80 FL — ABNORMAL HIGH (ref 80–100)
Lab: 81 FL — ABNORMAL LOW (ref 80–100)
Lab: 9.4 g/dL — ABNORMAL LOW (ref 12.0–15.0)
Lab: 0.1 10*3/uL (ref 0–0.20)
Lab: 0.3 10*3/uL (ref 0–0.45)
Lab: 0.7 10*3/uL (ref 0–0.80)
Lab: 1.8 10*3/uL (ref 1.0–4.8)
Lab: 10 % (ref 4–12)
Lab: 10 g/dL — ABNORMAL LOW (ref 12.0–15.0)
Lab: 11 FL — ABNORMAL HIGH (ref 7–11)
Lab: 152 10*3/uL (ref 150–400)
Lab: 2 % (ref 0–2)
Lab: 26 pg (ref 26–34)
Lab: 3.9 10*3/uL (ref 1.8–7.0)
Lab: 31 % — ABNORMAL HIGH (ref 11–15)
Lab: 31 % — ABNORMAL LOW (ref 36–45)
Lab: 31 g/dL — ABNORMAL LOW (ref 32.0–36.0)
Lab: 4 % (ref 0–5)
Lab: 58 % (ref 41–77)
Lab: 6.8 K/UL (ref 4.5–11.0)
Lab: 83 FL (ref 80–100)
Lab: 0.1 10*3/uL (ref 0–0.20)
Lab: 0.2 10*3/uL (ref 0–0.45)
Lab: 0.9 10*3/uL — ABNORMAL HIGH (ref 0–0.80)
Lab: 1.7 10*3/uL (ref 1.0–4.8)
Lab: 10 g/dL — ABNORMAL LOW (ref 12.0–15.0)
Lab: 14 % — ABNORMAL HIGH (ref 4–12)
Lab: 2 % (ref 0–2)
Lab: 26 % (ref 24–44)
Lab: 3.5 10*3/uL (ref 1.8–7.0)
Lab: 31 % — ABNORMAL LOW (ref 36–45)
Lab: 31 g/dL — ABNORMAL LOW (ref >60)
Lab: 4 % (ref 0–5)
Lab: 54 % — ABNORMAL LOW (ref 41–77)

## 2018-04-24 LAB — URINALYSIS DIPSTICK
Lab: NEGATIVE % (ref 0–5)
Lab: NEGATIVE % — ABNORMAL LOW (ref 24–44)
Lab: NEGATIVE K/UL — ABNORMAL HIGH (ref 3–12)
Lab: NEGATIVE K/UL — ABNORMAL LOW (ref 1.0–4.8)
Lab: NEGATIVE U/L (ref 7–40)
Lab: NEGATIVE g/dL (ref 3.5–5.0)

## 2018-04-24 LAB — PROTIME INR (PT)
Lab: 1.3 — ABNORMAL HIGH (ref 0.8–1.2)
Lab: 1.3 — ABNORMAL HIGH (ref 0.8–1.2)

## 2018-04-24 LAB — BASIC METABOLIC PANEL
Lab: 136 MMOL/L — ABNORMAL LOW (ref 137–147)
Lab: 14 % — ABNORMAL HIGH (ref 3–12)
Lab: 33 mg/dL — ABNORMAL HIGH (ref 7–25)
Lab: 57 mL/min — ABNORMAL LOW (ref >60)
Lab: 83 mg/dL (ref 70–100)
Lab: >60 mL/min — ABNORMAL HIGH (ref >60)

## 2018-04-24 LAB — POC LACTATE
Lab: 4.1 MMOL/L — ABNORMAL HIGH (ref 0.5–2.0)
Lab: 4.4 MMOL/L — ABNORMAL HIGH (ref 0.5–2.0)

## 2018-04-24 LAB — LIVER FUNCTION PANEL
Lab: 0.4 mg/dL — ABNORMAL HIGH (ref <0.4)
Lab: 1.3 mg/dL — ABNORMAL HIGH (ref 0.3–1.2)
Lab: 109 U/L (ref 25–110)
Lab: 11 U/L (ref 7–56)
Lab: 3.2 g/dL — ABNORMAL LOW (ref 3.5–5.0)
Lab: 6.2 g/dL (ref 6.0–8.0)

## 2018-04-24 LAB — URINALYSIS, MICROSCOPIC

## 2018-04-24 LAB — MAGNESIUM: Lab: 2 mg/dL (ref 1.6–2.6)

## 2018-04-24 LAB — LACTIC ACID(LACTATE)
Lab: 6.9 MMOL/L — ABNORMAL HIGH (ref 0.5–2.0)
Lab: 2 MMOL/L (ref 0.5–2.0)
Lab: 2.2 MMOL/L — ABNORMAL HIGH (ref 0.5–2.0)

## 2018-04-24 LAB — COMPREHENSIVE METABOLIC PANEL: Lab: 132 MMOL/L — ABNORMAL LOW (ref 137–147)

## 2018-04-24 LAB — POC TROPONIN: Lab: 0 ng/mL (ref 0.00–0.05)

## 2018-04-24 LAB — POC GLUCOSE
Lab: 104 mg/dL — ABNORMAL HIGH (ref 70–100)
Lab: 112 mg/dL — ABNORMAL HIGH (ref 70–100)
Lab: 158 mg/dL — ABNORMAL HIGH (ref 70–100)

## 2018-04-24 LAB — LIPASE: Lab: 62 U/L — ABNORMAL HIGH (ref 11–82)

## 2018-04-24 LAB — TSH WITH FREE T4 REFLEX: Lab: 2.4 uU/mL — ABNORMAL HIGH (ref 0.35–5.00)

## 2018-04-24 LAB — PHOSPHORUS: Lab: 5.3 mg/dL — ABNORMAL HIGH (ref 2.0–4.5)

## 2018-04-24 LAB — TROPONIN-I

## 2018-04-24 MED ORDER — LACTATED RINGERS IV SOLP
INTRAVENOUS | 0 refills | Status: CN
Start: 2018-04-24 — End: ?

## 2018-04-24 MED ORDER — PANTOPRAZOLE 40 MG IV SOLR
40 mg | Freq: Every day | INTRAVENOUS | 0 refills | Status: DC
Start: 2018-04-24 — End: 2018-04-24

## 2018-04-24 MED ORDER — SODIUM CHLORIDE 0.9 % IV SOLP
INTRAVENOUS | 0 refills | Status: DC
Start: 2018-04-24 — End: 2018-04-24
  Administered 2018-04-24 (×2): 1000.000 mL via INTRAVENOUS

## 2018-04-24 MED ORDER — PANTOPRAZOLE 40 MG IV SOLR
40 mg | Freq: Two times a day (BID) | INTRAVENOUS | 0 refills | Status: DC
Start: 2018-04-24 — End: 2018-04-25
  Administered 2018-04-25 (×2): 40 mg via INTRAVENOUS

## 2018-04-24 MED ORDER — LACTATED RINGERS IV SOLP
1000 mL | INTRAVENOUS | 0 refills | Status: DC
Start: 2018-04-24 — End: 2018-04-25
  Administered 2018-04-25: 10:00:00 1000 mL via INTRAVENOUS
  Administered 2018-04-25: 14:00:00 1000.000 mL via INTRAVENOUS
  Administered 2018-04-25 (×2): 1000 mL via INTRAVENOUS

## 2018-04-24 MED ORDER — POLYETHYLENE GLYCOL 3350 17 GRAM PO PWPK
17 g | Freq: Two times a day (BID) | ORAL | 0 refills | Status: DC
Start: 2018-04-24 — End: 2018-04-26
  Administered 2018-04-25: 01:00:00 17 g via ORAL

## 2018-04-24 MED ORDER — CALCIUM GLUCONATE IVPB (NON-STD)
2 g | Freq: Once | INTRAVENOUS | 0 refills | Status: CN
Start: 2018-04-24 — End: ?

## 2018-04-24 MED ORDER — ATORVASTATIN 10 MG PO TAB
10 mg | Freq: Every day | ORAL | 0 refills | Status: DC
Start: 2018-04-24 — End: 2018-04-26
  Administered 2018-04-24: 10 mg via ORAL

## 2018-04-24 MED ORDER — DULOXETINE 60 MG PO CPDR
60 mg | Freq: Every day | ORAL | 0 refills | Status: DC
Start: 2018-04-24 — End: 2018-04-26
  Administered 2018-04-24: 60 mg via ORAL

## 2018-04-24 MED ORDER — CHOLECALCIFEROL (VITAMIN D3) 1,000 UNIT PO TAB
1000 [IU] | Freq: Every day | ORAL | 0 refills | Status: DC
Start: 2018-04-24 — End: 2018-04-26
  Administered 2018-04-24: 1000 [IU] via ORAL

## 2018-04-24 MED ORDER — SODIUM CHLORIDE 0.9 % IV SOLP
INTRAVENOUS | 0 refills | Status: CN
Start: 2018-04-24 — End: ?

## 2018-04-25 ENCOUNTER — Inpatient Hospital Stay: Admit: 2018-04-25 | Discharge: 2018-04-25 | Payer: MEDICARE

## 2018-04-25 ENCOUNTER — Encounter: Admit: 2018-04-25 | Discharge: 2018-04-25 | Payer: MEDICARE

## 2018-04-25 ENCOUNTER — Emergency Department: Admit: 2018-04-23 | Discharge: 2018-04-23 | Payer: MEDICARE

## 2018-04-25 ENCOUNTER — Inpatient Hospital Stay: Admit: 2018-04-24 | Discharge: 2018-04-24 | Payer: MEDICARE

## 2018-04-25 ENCOUNTER — Inpatient Hospital Stay: Admit: 2018-04-23 | Discharge: 2018-04-25 | Disposition: A | Discharge status: Home / Self Care | Payer: MEDICARE

## 2018-04-25 DIAGNOSIS — Z6832 Body mass index (BMI) 32.0-32.9, adult: ICD-10-CM

## 2018-04-25 DIAGNOSIS — I8500 Esophageal varices without bleeding: ICD-10-CM

## 2018-04-25 DIAGNOSIS — J329 Chronic sinusitis, unspecified: ICD-10-CM

## 2018-04-25 DIAGNOSIS — E119 Type 2 diabetes mellitus without complications: ICD-10-CM

## 2018-04-25 DIAGNOSIS — E872 Acidosis: ICD-10-CM

## 2018-04-25 DIAGNOSIS — K766 Portal hypertension: ICD-10-CM

## 2018-04-25 DIAGNOSIS — E669 Obesity, unspecified: ICD-10-CM

## 2018-04-25 DIAGNOSIS — K254 Chronic or unspecified gastric ulcer with hemorrhage: Principal | ICD-10-CM

## 2018-04-25 DIAGNOSIS — K7581 Nonalcoholic steatohepatitis (NASH): ICD-10-CM

## 2018-04-25 DIAGNOSIS — Z90710 Acquired absence of both cervix and uterus: ICD-10-CM

## 2018-04-25 DIAGNOSIS — R0600 Dyspnea, unspecified: ICD-10-CM

## 2018-04-25 DIAGNOSIS — K219 Gastro-esophageal reflux disease without esophagitis: ICD-10-CM

## 2018-04-25 DIAGNOSIS — D509 Iron deficiency anemia, unspecified: ICD-10-CM

## 2018-04-25 DIAGNOSIS — I10 Essential (primary) hypertension: Secondary | ICD-10-CM

## 2018-04-25 DIAGNOSIS — K319 Disease of stomach and duodenum, unspecified: ICD-10-CM

## 2018-04-25 DIAGNOSIS — N179 Acute kidney failure, unspecified: ICD-10-CM

## 2018-04-25 DIAGNOSIS — R109 Unspecified abdominal pain: ICD-10-CM

## 2018-04-25 DIAGNOSIS — D62 Acute posthemorrhagic anemia: ICD-10-CM

## 2018-04-25 DIAGNOSIS — K317 Polyp of stomach and duodenum: ICD-10-CM

## 2018-04-25 DIAGNOSIS — K5730 Diverticulosis of large intestine without perforation or abscess without bleeding: ICD-10-CM

## 2018-04-25 DIAGNOSIS — E785 Hyperlipidemia, unspecified: ICD-10-CM

## 2018-04-25 DIAGNOSIS — K921 Melena: ICD-10-CM

## 2018-04-25 DIAGNOSIS — K31819 Angiodysplasia of stomach and duodenum without bleeding: Principal | ICD-10-CM

## 2018-04-25 DIAGNOSIS — R748 Abnormal levels of other serum enzymes: ICD-10-CM

## 2018-04-25 DIAGNOSIS — E861 Hypovolemia: ICD-10-CM

## 2018-04-25 DIAGNOSIS — K7460 Unspecified cirrhosis of liver: ICD-10-CM

## 2018-04-25 DIAGNOSIS — R945 Abnormal results of liver function studies: Principal | ICD-10-CM

## 2018-04-25 DIAGNOSIS — K760 Fatty (change of) liver, not elsewhere classified: ICD-10-CM

## 2018-04-25 LAB — POC GLUCOSE
Lab: 253 mg/dL — ABNORMAL HIGH (ref 70–100)
Lab: 129 mg/dL — ABNORMAL HIGH (ref 70–100)

## 2018-04-25 LAB — PROTIME INR (PT): Lab: 1.3 g/dL — ABNORMAL HIGH (ref >60)

## 2018-04-25 LAB — CBC
Lab: 10 FL — ABNORMAL LOW (ref 7–11)
Lab: 129 10*3/uL — ABNORMAL LOW (ref 150–400)
Lab: 25 pg — ABNORMAL LOW (ref >=60)
Lab: 28 % — ABNORMAL LOW (ref >=60)
Lab: 3.5 M/UL — ABNORMAL LOW (ref 4.0–5.0)
Lab: 30 % — ABNORMAL HIGH (ref 11–15)
Lab: 31 g/dL — ABNORMAL LOW (ref >=60)
Lab: 5.9 10*3/uL — ABNORMAL HIGH (ref <150)
Lab: 80 FL — ABNORMAL LOW (ref >=60)
Lab: 9 g/dL — ABNORMAL LOW (ref >=60)

## 2018-04-25 LAB — CBC AND DIFF
Lab: 3.6 M/UL — ABNORMAL LOW (ref >60)
Lab: 5 K/UL — ABNORMAL LOW (ref >60)

## 2018-04-25 LAB — LACTIC ACID(LACTATE)
Lab: 3.3 MMOL/L — ABNORMAL HIGH (ref 0.5–2.0)
Lab: 5 MMOL/L — ABNORMAL HIGH (ref 0.5–2.0)
Lab: 1.8 MMOL/L — ABNORMAL HIGH (ref >60)
Lab: 3.2 MMOL/L — ABNORMAL HIGH (ref 0.5–2.0)
Lab: 2.9 MMOL/L — ABNORMAL HIGH (ref 0.5–2.0)

## 2018-04-25 LAB — COMPREHENSIVE METABOLIC PANEL
Lab: 140 MMOL/L — ABNORMAL LOW (ref >60)
Lab: 18 mg/dL — ABNORMAL LOW (ref >60)

## 2018-04-25 LAB — PHOSPHORUS: Lab: 3.6 mg/dL — ABNORMAL LOW (ref >60)

## 2018-04-25 MED ORDER — PANTOPRAZOLE 40 MG PO TBEC
40 mg | Freq: Two times a day (BID) | ORAL | 0 refills | Status: DC
Start: 2018-04-25 — End: 2018-04-26

## 2018-04-25 MED ORDER — PROPOFOL INJ 10 MG/ML IV VIAL
0 refills | Status: DC
Start: 2018-04-25 — End: 2018-04-25
  Administered 2018-04-25: 14:00:00 20 mg via INTRAVENOUS
  Administered 2018-04-25: 14:00:00 50 mg via INTRAVENOUS
  Administered 2018-04-25: 14:00:00 20 mg via INTRAVENOUS

## 2018-04-25 MED ORDER — PANTOPRAZOLE 40 MG PO TBEC
40 mg | ORAL_TABLET | Freq: Two times a day (BID) | ORAL | 3 refills | Status: SS
Start: 2018-04-25 — End: 2018-10-28

## 2018-04-25 MED ORDER — SODIUM CHLORIDE 0.9 % IV SOLP
250 mL | INTRAVENOUS | 0 refills | Status: CP
Start: 2018-04-25 — End: ?
  Administered 2018-04-25: 22:00:00 250 mL via INTRAVENOUS

## 2018-04-25 MED ORDER — CARVEDILOL 25 MG PO TAB
12.5 mg | ORAL_TABLET | Freq: Two times a day (BID) | ORAL | 0 refills | 90.00000 days | Status: AC
Start: 2018-04-25 — End: 2018-10-14

## 2018-04-25 MED ORDER — PROPOFOL 10 MG/ML IV EMUL 50 ML (INFUSION)(AM)(OR)
INTRAVENOUS | 0 refills | Status: DC
Start: 2018-04-25 — End: 2018-04-25
  Administered 2018-04-25: 14:00:00 100 ug/kg/min via INTRAVENOUS

## 2018-04-25 MED ORDER — CARVEDILOL 12.5 MG PO TAB
12.5 mg | Freq: Two times a day (BID) | ORAL | 0 refills | Status: DC
Start: 2018-04-25 — End: 2018-04-26

## 2018-04-26 ENCOUNTER — Encounter: Admit: 2018-04-26 | Discharge: 2018-04-26 | Payer: MEDICARE

## 2018-04-26 DIAGNOSIS — E119 Type 2 diabetes mellitus without complications: ICD-10-CM

## 2018-04-26 DIAGNOSIS — I10 Essential (primary) hypertension: ICD-10-CM

## 2018-04-26 DIAGNOSIS — R109 Unspecified abdominal pain: ICD-10-CM

## 2018-04-26 DIAGNOSIS — K760 Fatty (change of) liver, not elsewhere classified: ICD-10-CM

## 2018-04-26 DIAGNOSIS — R945 Abnormal results of liver function studies: Principal | ICD-10-CM

## 2018-04-26 DIAGNOSIS — K5730 Diverticulosis of large intestine without perforation or abscess without bleeding: ICD-10-CM

## 2018-04-26 DIAGNOSIS — R0600 Dyspnea, unspecified: ICD-10-CM

## 2018-04-26 DIAGNOSIS — J329 Chronic sinusitis, unspecified: ICD-10-CM

## 2018-04-26 DIAGNOSIS — E669 Obesity, unspecified: ICD-10-CM

## 2018-04-26 DIAGNOSIS — R748 Abnormal levels of other serum enzymes: ICD-10-CM

## 2018-04-29 LAB — CULTURE-BLOOD W/SENSITIVITY

## 2018-05-02 ENCOUNTER — Encounter: Admit: 2018-05-02 | Discharge: 2018-05-02 | Payer: MEDICARE

## 2018-05-02 DIAGNOSIS — K7581 Nonalcoholic steatohepatitis (NASH): ICD-10-CM

## 2018-05-02 DIAGNOSIS — K7460 Unspecified cirrhosis of liver: ICD-10-CM

## 2018-05-02 DIAGNOSIS — K7290 Hepatic failure, unspecified without coma: Principal | ICD-10-CM

## 2018-05-02 LAB — PROTIME INR (PT)
Lab: 1.3
Lab: 14 s — ABNORMAL HIGH (ref 9.9–12.1)

## 2018-05-05 ENCOUNTER — Encounter: Admit: 2018-05-05 | Discharge: 2018-05-05 | Payer: MEDICARE

## 2018-05-05 DIAGNOSIS — K31819 Angiodysplasia of stomach and duodenum without bleeding: Principal | ICD-10-CM

## 2018-05-05 LAB — CBC
Lab: 10 — ABNORMAL LOW (ref 12.0–16.0)
Lab: 231 (ref 130–400)
Lab: 29 — ABNORMAL LOW (ref 33.0–37.0)
Lab: 4.4
Lab: 6.6
Lab: 82
Lab: 24 — ABNORMAL LOW (ref 27.0–31.0)
Lab: 25 — ABNORMAL HIGH (ref 11.5–14.5)
Lab: 36 — ABNORMAL LOW (ref 37.0–47.0)

## 2018-05-24 ENCOUNTER — Encounter: Admit: 2018-05-24 | Discharge: 2018-05-24 | Payer: MEDICARE

## 2018-08-18 ENCOUNTER — Encounter: Admit: 2018-08-18 | Discharge: 2018-08-18 | Payer: MEDICARE

## 2018-08-27 ENCOUNTER — Encounter: Admit: 2018-08-27 | Discharge: 2018-08-27 | Payer: MEDICARE

## 2018-08-27 DIAGNOSIS — I10 Essential (primary) hypertension: ICD-10-CM

## 2018-08-27 DIAGNOSIS — E1121 Type 2 diabetes mellitus with diabetic nephropathy: ICD-10-CM

## 2018-08-27 DIAGNOSIS — R109 Unspecified abdominal pain: ICD-10-CM

## 2018-08-27 DIAGNOSIS — E119 Type 2 diabetes mellitus without complications: ICD-10-CM

## 2018-08-27 DIAGNOSIS — R945 Abnormal results of liver function studies: Principal | ICD-10-CM

## 2018-08-27 DIAGNOSIS — K5730 Diverticulosis of large intestine without perforation or abscess without bleeding: ICD-10-CM

## 2018-08-27 DIAGNOSIS — R0600 Dyspnea, unspecified: ICD-10-CM

## 2018-08-27 DIAGNOSIS — R748 Abnormal levels of other serum enzymes: ICD-10-CM

## 2018-08-27 DIAGNOSIS — K760 Fatty (change of) liver, not elsewhere classified: ICD-10-CM

## 2018-08-27 DIAGNOSIS — E669 Obesity, unspecified: Secondary | ICD-10-CM

## 2018-08-27 DIAGNOSIS — J329 Chronic sinusitis, unspecified: ICD-10-CM

## 2018-08-27 DIAGNOSIS — D500 Iron deficiency anemia secondary to blood loss (chronic): Secondary | ICD-10-CM

## 2018-08-27 DIAGNOSIS — K922 Gastrointestinal hemorrhage, unspecified: ICD-10-CM

## 2018-08-27 DIAGNOSIS — D649 Anemia, unspecified: ICD-10-CM

## 2018-08-27 DIAGNOSIS — K7460 Unspecified cirrhosis of liver: ICD-10-CM

## 2018-08-27 LAB — ALPHA FETO PROTEIN (AFP): Lab: 2.6 ng/mL — ABNORMAL LOW (ref 0.0–15.0)

## 2018-08-27 LAB — CBC AND DIFF
Lab: 0.1 10*3/uL (ref 0–0.20)
Lab: 3.2 M/UL — ABNORMAL LOW (ref 4.0–5.0)
Lab: 6.3 K/UL (ref 4.5–11.0)

## 2018-08-27 LAB — COMPREHENSIVE METABOLIC PANEL
Lab: 0.8 mg/dL — ABNORMAL HIGH (ref 0.4–1.00)
Lab: 10 K/UL (ref 3–12)
Lab: 104 mg/dL — ABNORMAL HIGH (ref 70–100)
Lab: 139 MMOL/L — CL (ref 137–147)
Lab: 15 U/L (ref 7–56)
Lab: 25 U/L (ref 7–40)
Lab: 27 MMOL/L (ref 21–30)
Lab: 3.9 MMOL/L — ABNORMAL LOW (ref 3.5–5.1)
Lab: >60 mL/min (ref >60)

## 2018-08-27 LAB — LIVER FUNCTION PANEL
Lab: 0.8 mg/dL — ABNORMAL HIGH (ref <0.4)
Lab: 1.9 mg/dL — ABNORMAL HIGH (ref 0.3–1.2)
Lab: 103 U/L (ref 25–110)
Lab: 14 U/L (ref 7–56)
Lab: 24 U/L (ref 7–40)
Lab: 3.4 g/dL — ABNORMAL LOW (ref 3.5–5.0)
Lab: 6.5 g/dL (ref 6.0–8.0)

## 2018-08-27 LAB — IRON + BINDING CAPACITY + %SAT+ FERRITIN
Lab: 10 ug/dL — ABNORMAL LOW (ref 50–160)
Lab: 580 ug/dL — ABNORMAL HIGH (ref 270–380)
Lab: 9 ng/mL — ABNORMAL LOW (ref 10–200)

## 2018-08-27 LAB — BNP (B-TYPE NATRIURETIC PEPTI): Lab: 220 pg/mL — ABNORMAL HIGH (ref 0–100)

## 2018-08-27 LAB — PROTIME INR (PT): Lab: 1.3 MMOL/L — ABNORMAL HIGH (ref 0.8–1.2)

## 2018-08-27 LAB — 25-OH VITAMIN D (D2 + D3): Lab: 64 ng/mL — ABNORMAL LOW (ref 30–80)

## 2018-08-27 LAB — TROPONIN-I

## 2018-08-27 LAB — HEMOGLOBIN A1C: Lab: 5.3 % (ref 4.0–6.0)

## 2018-08-27 MED ORDER — POTASSIUM, SODIUM PHOSPHATES 280-160-250 MG PO PWPK
1 | NASOGASTRIC | 0 refills | Status: DC | PRN
Start: 2018-08-27 — End: 2018-08-29

## 2018-08-27 MED ORDER — POTASSIUM CHLORIDE IN WATER 10 MEQ/50 ML IV PGBK
10 meq | INTRAVENOUS | 0 refills | Status: DC | PRN
Start: 2018-08-27 — End: 2018-08-29

## 2018-08-27 MED ORDER — DULOXETINE 60 MG PO CPDR
60 mg | Freq: Every day | ORAL | 0 refills | Status: DC
Start: 2018-08-27 — End: 2018-08-29
  Administered 2018-08-28 – 2018-08-29 (×2): 60 mg via ORAL

## 2018-08-27 MED ORDER — SODIUM PHOSPHATE IVPB
8 MMOL | INTRAVENOUS | 0 refills | Status: DC | PRN
Start: 2018-08-27 — End: 2018-08-29

## 2018-08-27 MED ORDER — CEFTRIAXONE INJ 1GM IVP
1 g | INTRAVENOUS | 0 refills | Status: DC
Start: 2018-08-27 — End: 2018-08-28

## 2018-08-27 MED ORDER — POLYETHYLENE GLYCOL 3350 17 GRAM PO PWPK
17 g | Freq: Two times a day (BID) | ORAL | 0 refills | Status: DC
Start: 2018-08-27 — End: 2018-08-29
  Administered 2018-08-28 – 2018-08-29 (×3): 17 g via ORAL

## 2018-08-27 MED ORDER — MELATONIN 5 MG PO TAB
5 mg | Freq: Every evening | ORAL | 0 refills | Status: DC | PRN
Start: 2018-08-27 — End: 2018-08-29

## 2018-08-27 MED ORDER — MAGNESIUM CHLORIDE 64 MG PO TBER
535 mg | ORAL | 0 refills | Status: DC | PRN
Start: 2018-08-27 — End: 2018-08-29
  Administered 2018-08-28: 11:00:00 535 mg via ORAL

## 2018-08-27 MED ORDER — PANTOPRAZOLE 40 MG IV SOLR
80 mg | Freq: Once | INTRAVENOUS | 0 refills | Status: CP
Start: 2018-08-27 — End: ?
  Administered 2018-08-27: 21:00:00 80 mg via INTRAVENOUS

## 2018-08-27 MED ORDER — INSULIN ASPART 100 UNIT/ML SC FLEXPEN
0-7 [IU] | Freq: Every day | SUBCUTANEOUS | 0 refills | Status: DC
Start: 2018-08-27 — End: 2018-08-29

## 2018-08-27 MED ORDER — POTASSIUM PHOSPHATE, MONOBASIC 500 MG PO TBSO
2 | ORAL | 0 refills | Status: DC | PRN
Start: 2018-08-27 — End: 2018-08-29

## 2018-08-27 MED ORDER — ATORVASTATIN 10 MG PO TAB
10 mg | Freq: Every day | ORAL | 0 refills | Status: DC
Start: 2018-08-27 — End: 2018-08-29
  Administered 2018-08-28 – 2018-08-29 (×2): 10 mg via ORAL

## 2018-08-27 MED ORDER — POTASSIUM CHLORIDE 20 MEQ PO TBTQ
40-60 meq | ORAL | 0 refills | Status: DC | PRN
Start: 2018-08-27 — End: 2018-08-29
  Administered 2018-08-28 – 2018-08-29 (×2): 40 meq via ORAL

## 2018-08-27 MED ORDER — POTASSIUM CHLORIDE 20 MEQ/15 ML PO LIQD
40-60 meq | NASOGASTRIC | 0 refills | Status: DC | PRN
Start: 2018-08-27 — End: 2018-08-29
  Administered 2018-08-28: 14:00:00 60 meq via NASOGASTRIC

## 2018-08-27 MED ORDER — LACTATED RINGERS IV SOLP
1000 mL | Freq: Once | INTRAVENOUS | 0 refills | Status: DC
Start: 2018-08-27 — End: 2018-08-27

## 2018-08-27 MED ORDER — CIPROFLOXACIN IN 5 % DEXTROSE 400 MG/200 ML IV PGBK
400 mg | Freq: Two times a day (BID) | INTRAVENOUS | 0 refills | Status: DC
Start: 2018-08-27 — End: 2018-08-28
  Administered 2018-08-28 (×2): 400 mg via INTRAVENOUS

## 2018-08-27 MED ORDER — CHOLECALCIFEROL (VITAMIN D3) 1,000 UNIT (25 MCG) PO TAB
1000 [IU] | Freq: Every day | ORAL | 0 refills | Status: DC
Start: 2018-08-27 — End: 2018-08-29
  Administered 2018-08-28 – 2018-08-29 (×2): 1000 [IU] via ORAL

## 2018-08-27 MED ORDER — ACETAMINOPHEN 325 MG PO TAB
650 mg | ORAL | 0 refills | Status: DC | PRN
Start: 2018-08-27 — End: 2018-08-29

## 2018-08-27 MED ORDER — ALBUMIN, HUMAN 25 % IV SOLP
25 g | INTRAVENOUS | 0 refills | Status: CP
Start: 2018-08-27 — End: ?
  Administered 2018-08-28 (×4): 25 g via INTRAVENOUS

## 2018-08-27 MED ORDER — TRAMADOL 50 MG PO TAB
50 mg | ORAL | 0 refills | Status: DC | PRN
Start: 2018-08-27 — End: 2018-08-29

## 2018-08-27 MED ORDER — LACTATED RINGERS IV SOLP
1000 mL | INTRAVENOUS | 0 refills | Status: CP
Start: 2018-08-27 — End: ?
  Administered 2018-08-27: 21:00:00 1000 mL via INTRAVENOUS

## 2018-08-27 MED ORDER — MAGNESIUM SULFATE IN D5W 1 GRAM/100 ML IV PGBK
1 g | INTRAVENOUS | 0 refills | Status: DC | PRN
Start: 2018-08-27 — End: 2018-08-29

## 2018-08-27 MED ORDER — PANTOPRAZOLE 40 MG IV SOLR
40 mg | Freq: Two times a day (BID) | INTRAVENOUS | 0 refills | Status: DC
Start: 2018-08-27 — End: 2018-08-29
  Administered 2018-08-28 – 2018-08-29 (×3): 40 mg via INTRAVENOUS

## 2018-08-28 ENCOUNTER — Inpatient Hospital Stay: Admit: 2018-08-28 | Discharge: 2018-08-28 | Payer: MEDICARE

## 2018-08-28 ENCOUNTER — Encounter: Admit: 2018-08-28 | Discharge: 2018-08-28 | Payer: MEDICARE

## 2018-08-28 DIAGNOSIS — R945 Abnormal results of liver function studies: Principal | ICD-10-CM

## 2018-08-28 DIAGNOSIS — K5730 Diverticulosis of large intestine without perforation or abscess without bleeding: ICD-10-CM

## 2018-08-28 DIAGNOSIS — I10 Essential (primary) hypertension: Secondary | ICD-10-CM

## 2018-08-28 DIAGNOSIS — J329 Chronic sinusitis, unspecified: ICD-10-CM

## 2018-08-28 DIAGNOSIS — R748 Abnormal levels of other serum enzymes: ICD-10-CM

## 2018-08-28 DIAGNOSIS — K760 Fatty (change of) liver, not elsewhere classified: ICD-10-CM

## 2018-08-28 DIAGNOSIS — E669 Obesity, unspecified: Secondary | ICD-10-CM

## 2018-08-28 DIAGNOSIS — E119 Type 2 diabetes mellitus without complications: ICD-10-CM

## 2018-08-28 DIAGNOSIS — R109 Unspecified abdominal pain: ICD-10-CM

## 2018-08-28 DIAGNOSIS — R0600 Dyspnea, unspecified: ICD-10-CM

## 2018-08-28 LAB — COMPREHENSIVE METABOLIC PANEL
Lab: 0.8 mg/dL (ref 0.4–1.00)
Lab: 142 MMOL/L (ref 137–147)
Lab: 15 — ABNORMAL HIGH (ref 3–12)
Lab: 16 U/L (ref 7–56)
Lab: 16 mg/dL (ref 7–25)
Lab: 24 MMOL/L (ref 21–30)
Lab: 24 U/L (ref 7–40)
Lab: 3.3 g/dL — ABNORMAL LOW (ref 3.5–5.0)
Lab: 4.6 mg/dL — ABNORMAL HIGH (ref 0.3–1.2)
Lab: 6.4 g/dL (ref 6.0–8.0)
Lab: 84 U/L (ref 25–110)
Lab: 9.1 mg/dL (ref 8.5–10.6)
Lab: 90 mg/dL (ref 70–100)
Lab: >60 mL/min (ref >60)
Lab: 138 MMOL/L — ABNORMAL LOW (ref 137–147)

## 2018-08-28 LAB — PERITONEAL FLUID TOTAL PROTEIN: Lab: 2 g/dL

## 2018-08-28 LAB — POC GLUCOSE
Lab: 124 mg/dL — ABNORMAL HIGH (ref 70–100)
Lab: 99 mg/dL (ref 70–100)
Lab: 106 mg/dL — ABNORMAL HIGH (ref 70–100)

## 2018-08-28 LAB — CBC
Lab: 142 10*3/uL — ABNORMAL LOW (ref 150–400)
Lab: 18 pg — ABNORMAL LOW (ref 26–34)
Lab: 20 % — ABNORMAL LOW (ref 36–45)
Lab: 21 % — ABNORMAL HIGH (ref 11–15)
Lab: 29 g/dL — ABNORMAL LOW (ref 32.0–36.0)
Lab: 3.3 M/UL — ABNORMAL LOW (ref 4.0–5.0)
Lab: 5.6 K/UL (ref >60)
Lab: 6 g/dL — ABNORMAL LOW (ref 12.0–15.0)
Lab: 62 FL — ABNORMAL LOW (ref 80–100)
Lab: 8.9 FL (ref 7–11)
Lab: 4.6 K/UL — ABNORMAL HIGH (ref <100)
Lab: 3.5 M/UL — ABNORMAL LOW (ref >60)
Lab: 5.8 K/UL — ABNORMAL HIGH (ref 4.5–11.0)
Lab: 66 FL — ABNORMAL LOW (ref >60)
Lab: 7.4 g/dL — ABNORMAL LOW (ref >60)

## 2018-08-28 LAB — PERITONEAL FLUID ALBUMIN

## 2018-08-28 LAB — GRAM STAIN

## 2018-08-28 LAB — MAGNESIUM
Lab: 1.7 mg/dL (ref 1.6–2.6)
Lab: 1.8 mg/dL — ABNORMAL LOW (ref 1.6–2.6)

## 2018-08-28 LAB — PHOSPHORUS
Lab: 3.4 mg/dL (ref 2.0–4.5)
Lab: 3.6 mg/dL — ABNORMAL LOW (ref 2.0–4.5)

## 2018-08-28 LAB — PROTIME INR (PT): Lab: 1.4 mg/dL — ABNORMAL HIGH (ref <150)

## 2018-08-28 MED ORDER — CIPROFLOXACIN HCL 500 MG PO TAB
500 mg | Freq: Two times a day (BID) | ORAL | 0 refills | Status: DC
Start: 2018-08-28 — End: 2018-08-29
  Administered 2018-08-29: 01:00:00 500 mg via ORAL

## 2018-08-28 MED ORDER — LACTATED RINGERS IV SOLP
INTRAVENOUS | 0 refills | Status: DC
Start: 2018-08-28 — End: 2018-08-29
  Administered 2018-08-28: 21:00:00 1000.000 mL via INTRAVENOUS

## 2018-08-28 MED ORDER — PROPOFOL 10 MG/ML IV EMUL 20 ML (INFUSION)(AM)(OR)
INTRAVENOUS | 0 refills | Status: DC
Start: 2018-08-28 — End: 2018-08-28
  Administered 2018-08-28: 22:00:00 100 ug/kg/min via INTRAVENOUS

## 2018-08-28 MED ORDER — PERFLUTREN LIPID MICROSPHERES 1.1 MG/ML IV SUSP
1-20 mL | Freq: Once | INTRAVENOUS | 0 refills | Status: CP | PRN
Start: 2018-08-28 — End: ?
  Administered 2018-08-28: 19:00:00 2 mL via INTRAVENOUS

## 2018-08-28 MED ORDER — POTASSIUM CHLORIDE 20 MEQ PO TBTQ
60 meq | Freq: Once | ORAL | 0 refills | Status: AC
Start: 2018-08-28 — End: ?

## 2018-08-28 MED ORDER — SODIUM CHLORIDE 0.9 % IV SOLP
INTRAVENOUS | 0 refills | Status: CN
Start: 2018-08-28 — End: ?

## 2018-08-28 MED ORDER — LIDOCAINE (PF) 200 MG/10 ML (2 %) IJ SYRG
0 refills | Status: DC
Start: 2018-08-28 — End: 2018-08-28
  Administered 2018-08-28: 22:00:00 40 mg via INTRAVENOUS

## 2018-08-28 MED ORDER — PROPOFOL INJ 10 MG/ML IV VIAL
0 refills | Status: DC
Start: 2018-08-28 — End: 2018-08-28
  Administered 2018-08-28 (×2): 50 mg via INTRAVENOUS
  Administered 2018-08-28: 22:00:00 20 mg via INTRAVENOUS
  Administered 2018-08-28: 22:00:00 30 mg via INTRAVENOUS

## 2018-08-28 MED ORDER — ACETAMINOPHEN 325 MG PO TAB
650 mg | ORAL | 0 refills | Status: DC | PRN
Start: 2018-08-28 — End: 2018-08-29
  Administered 2018-08-29: 09:00:00 650 mg via ORAL

## 2018-08-28 MED ORDER — CIPROFLOXACIN HCL 500 MG PO TAB
500 mg | Freq: Every day | ORAL | 0 refills | Status: DC
Start: 2018-08-28 — End: 2018-08-28

## 2018-08-29 ENCOUNTER — Encounter: Admit: 2018-08-29 | Discharge: 2018-08-29 | Payer: MEDICARE

## 2018-08-29 ENCOUNTER — Ambulatory Visit: Admit: 2018-08-27 | Discharge: 2018-08-27 | Payer: MEDICARE

## 2018-08-29 ENCOUNTER — Emergency Department: Admit: 2018-08-27 | Discharge: 2018-08-27 | Payer: MEDICARE

## 2018-08-29 ENCOUNTER — Inpatient Hospital Stay
Admit: 2018-08-27 | Discharge: 2018-08-29 | Disposition: A | Discharge status: Home / Self Care | Payer: MEDICARE | Admitting: Student in an Organized Health Care Education/Training Program

## 2018-08-29 ENCOUNTER — Emergency Department: Admit: 2018-08-27 | Discharge: 2018-08-28 | Payer: MEDICARE

## 2018-08-29 ENCOUNTER — Inpatient Hospital Stay: Admit: 2018-08-28 | Discharge: 2018-08-28 | Payer: MEDICARE

## 2018-08-29 DIAGNOSIS — K210 Gastro-esophageal reflux disease with esophagitis: ICD-10-CM

## 2018-08-29 DIAGNOSIS — Z882 Allergy status to sulfonamides status: ICD-10-CM

## 2018-08-29 DIAGNOSIS — K7290 Hepatic failure, unspecified without coma: ICD-10-CM

## 2018-08-29 DIAGNOSIS — K3189 Other diseases of stomach and duodenum: ICD-10-CM

## 2018-08-29 DIAGNOSIS — K317 Polyp of stomach and duodenum: ICD-10-CM

## 2018-08-29 DIAGNOSIS — Z888 Allergy status to other drugs, medicaments and biological substances status: ICD-10-CM

## 2018-08-29 DIAGNOSIS — R188 Other ascites: ICD-10-CM

## 2018-08-29 DIAGNOSIS — K766 Portal hypertension: ICD-10-CM

## 2018-08-29 DIAGNOSIS — K31819 Angiodysplasia of stomach and duodenum without bleeding: ICD-10-CM

## 2018-08-29 DIAGNOSIS — E785 Hyperlipidemia, unspecified: ICD-10-CM

## 2018-08-29 DIAGNOSIS — Z6831 Body mass index (BMI) 31.0-31.9, adult: ICD-10-CM

## 2018-08-29 DIAGNOSIS — Z880 Allergy status to penicillin: ICD-10-CM

## 2018-08-29 DIAGNOSIS — I10 Essential (primary) hypertension: ICD-10-CM

## 2018-08-29 DIAGNOSIS — K7581 Nonalcoholic steatohepatitis (NASH): ICD-10-CM

## 2018-08-29 DIAGNOSIS — Z885 Allergy status to narcotic agent status: ICD-10-CM

## 2018-08-29 DIAGNOSIS — Z7984 Long term (current) use of oral hypoglycemic drugs: ICD-10-CM

## 2018-08-29 DIAGNOSIS — Z79899 Other long term (current) drug therapy: ICD-10-CM

## 2018-08-29 DIAGNOSIS — D500 Iron deficiency anemia secondary to blood loss (chronic): ICD-10-CM

## 2018-08-29 DIAGNOSIS — E119 Type 2 diabetes mellitus without complications: ICD-10-CM

## 2018-08-29 DIAGNOSIS — K7469 Other cirrhosis of liver: Principal | ICD-10-CM

## 2018-08-29 DIAGNOSIS — D696 Thrombocytopenia, unspecified: ICD-10-CM

## 2018-08-29 DIAGNOSIS — Z90710 Acquired absence of both cervix and uterus: ICD-10-CM

## 2018-08-29 DIAGNOSIS — E669 Obesity, unspecified: ICD-10-CM

## 2018-08-29 DIAGNOSIS — Z91041 Radiographic dye allergy status: ICD-10-CM

## 2018-08-29 DIAGNOSIS — K5730 Diverticulosis of large intestine without perforation or abscess without bleeding: ICD-10-CM

## 2018-08-29 DIAGNOSIS — I9589 Other hypotension: ICD-10-CM

## 2018-08-29 DIAGNOSIS — E8801 Alpha-1-antitrypsin deficiency: ICD-10-CM

## 2018-08-29 DIAGNOSIS — R1032 Left lower quadrant pain: ICD-10-CM

## 2018-08-29 DIAGNOSIS — D684 Acquired coagulation factor deficiency: ICD-10-CM

## 2018-08-29 DIAGNOSIS — D62 Acute posthemorrhagic anemia: ICD-10-CM

## 2018-08-29 DIAGNOSIS — G8929 Other chronic pain: ICD-10-CM

## 2018-08-29 DIAGNOSIS — J329 Chronic sinusitis, unspecified: ICD-10-CM

## 2018-08-29 DIAGNOSIS — R945 Abnormal results of liver function studies: Principal | ICD-10-CM

## 2018-08-29 DIAGNOSIS — R109 Unspecified abdominal pain: ICD-10-CM

## 2018-08-29 DIAGNOSIS — R0600 Dyspnea, unspecified: ICD-10-CM

## 2018-08-29 DIAGNOSIS — K760 Fatty (change of) liver, not elsewhere classified: ICD-10-CM

## 2018-08-29 DIAGNOSIS — R748 Abnormal levels of other serum enzymes: ICD-10-CM

## 2018-08-29 LAB — POC GLUCOSE
Lab: 165 mg/dL — ABNORMAL HIGH (ref 70–100)
Lab: 146 mg/dL — ABNORMAL HIGH (ref 70–100)
Lab: 133 mg/dL — ABNORMAL HIGH (ref >60)

## 2018-08-29 LAB — URINALYSIS DIPSTICK
Lab: NEGATIVE
Lab: NEGATIVE
Lab: NEGATIVE
Lab: NEGATIVE mL/min — ABNORMAL LOW
Lab: POSITIVE mL/min — AB

## 2018-08-29 LAB — URINALYSIS, MICROSCOPIC

## 2018-08-29 LAB — CBC
Lab: 3.8 M/UL — ABNORMAL LOW (ref >60)
Lab: 6.6 K/UL (ref >60)
Lab: 9.8 K/UL — ABNORMAL LOW (ref 4.5–11.0)

## 2018-08-29 LAB — POTASSIUM: Lab: 3.8 MMOL/L (ref 3.5–5.1)

## 2018-08-29 LAB — MAGNESIUM
Lab: 1.7 mg/dL — ABNORMAL LOW (ref 1.6–2.6)
Lab: 1.7 mg/dL (ref 1.6–2.6)

## 2018-08-29 LAB — PHOSPHORUS: Lab: 2.5 mg/dL — ABNORMAL HIGH (ref 2.0–4.5)

## 2018-08-29 LAB — COMPREHENSIVE METABOLIC PANEL: Lab: 138 MMOL/L — ABNORMAL LOW (ref 137–147)

## 2018-08-29 LAB — CELL COUNT W/DIFF-FLUIDS: Lab: 200 /uL

## 2018-08-29 LAB — PROTIME INR (PT): Lab: 1.5 mg/dL — ABNORMAL HIGH (ref 0.8–1.2)

## 2018-08-29 MED ORDER — CIPROFLOXACIN HCL 500 MG PO TAB
500 mg | ORAL_TABLET | Freq: Two times a day (BID) | ORAL | 0 refills | 10.00000 days | Status: AC
Start: 2018-08-29 — End: ?

## 2018-09-01 ENCOUNTER — Ambulatory Visit: Admit: 2018-09-01 | Discharge: 2018-09-01 | Payer: MEDICARE

## 2018-09-02 LAB — CULTURE-WOUND/TISSUE/FLUID(AEROBIC ONLY)W/SENSITIVITY

## 2018-09-11 ENCOUNTER — Encounter: Admit: 2018-09-11 | Discharge: 2018-09-11 | Payer: MEDICARE

## 2018-09-11 DIAGNOSIS — K7290 Hepatic failure, unspecified without coma: Principal | ICD-10-CM

## 2018-09-11 DIAGNOSIS — K7460 Unspecified cirrhosis of liver: ICD-10-CM

## 2018-09-11 LAB — CBC AND DIFF
Lab: 21 pg — ABNORMAL LOW (ref 27.0–31.0)
Lab: 28 % — ABNORMAL LOW (ref 37.0–47.0)
Lab: 28 g/dL — ABNORMAL LOW (ref 33.0–37.0)
Lab: 3.5 % (ref 0.0–6.0)
Lab: 3.5 MMOL/L (ref 1.9–8.1)
Lab: 3.8 10*6/uL — ABNORMAL LOW (ref 4.20–5.40)
Lab: 5.3 uL (ref 4.8–10.8)
Lab: 8 % (ref 0.0–10.0)
Lab: 8.1 g/dL — ABNORMAL LOW (ref 12.0–16.0)

## 2018-09-15 ENCOUNTER — Encounter: Admit: 2018-09-15 | Discharge: 2018-09-15 | Payer: MEDICARE

## 2018-10-10 ENCOUNTER — Encounter: Admit: 2018-10-10 | Discharge: 2018-10-10 | Payer: MEDICARE

## 2018-10-13 ENCOUNTER — Encounter: Admit: 2018-10-13 | Discharge: 2018-10-13 | Payer: MEDICARE

## 2018-10-14 ENCOUNTER — Encounter: Admit: 2018-10-14 | Discharge: 2018-10-14 | Payer: MEDICARE

## 2018-10-14 ENCOUNTER — Ambulatory Visit: Admit: 2018-10-14 | Discharge: 2018-10-15 | Payer: MEDICARE

## 2018-10-14 DIAGNOSIS — R0600 Dyspnea, unspecified: ICD-10-CM

## 2018-10-14 DIAGNOSIS — I10 Essential (primary) hypertension: ICD-10-CM

## 2018-10-14 DIAGNOSIS — E669 Obesity, unspecified: Secondary | ICD-10-CM

## 2018-10-14 DIAGNOSIS — R748 Abnormal levels of other serum enzymes: ICD-10-CM

## 2018-10-14 DIAGNOSIS — R109 Unspecified abdominal pain: ICD-10-CM

## 2018-10-14 DIAGNOSIS — E1121 Type 2 diabetes mellitus with diabetic nephropathy: Principal | ICD-10-CM

## 2018-10-14 DIAGNOSIS — E119 Type 2 diabetes mellitus without complications: ICD-10-CM

## 2018-10-14 DIAGNOSIS — J329 Chronic sinusitis, unspecified: ICD-10-CM

## 2018-10-14 DIAGNOSIS — K5730 Diverticulosis of large intestine without perforation or abscess without bleeding: ICD-10-CM

## 2018-10-14 DIAGNOSIS — K766 Portal hypertension: ICD-10-CM

## 2018-10-14 DIAGNOSIS — K760 Fatty (change of) liver, not elsewhere classified: ICD-10-CM

## 2018-10-14 DIAGNOSIS — R945 Abnormal results of liver function studies: Principal | ICD-10-CM

## 2018-10-14 MED ORDER — CARVEDILOL 6.25 MG PO TAB
6.25 mg | ORAL_TABLET | Freq: Two times a day (BID) | ORAL | 0 refills | 90.00000 days | Status: AC
Start: 2018-10-14 — End: 2018-10-17

## 2018-10-17 MED ORDER — CARVEDILOL 6.25 MG PO TAB
6.25 mg | ORAL_TABLET | Freq: Two times a day (BID) | ORAL | 0 refills | 90.00000 days | Status: AC
Start: 2018-10-17 — End: 2018-10-28

## 2018-10-21 ENCOUNTER — Encounter: Admit: 2018-10-21 | Discharge: 2018-10-21 | Payer: MEDICARE

## 2018-10-21 DIAGNOSIS — K7290 Hepatic failure, unspecified without coma: Principal | ICD-10-CM

## 2018-10-21 LAB — IRON + BINDING CAPACITY + %SAT+ FERRITIN
Lab: 18 ng/mL — ABNORMAL HIGH (ref 4.63–204.00)
Lab: 26 ug/dL — ABNORMAL LOW (ref 45–160)
Lab: 447 ug/dL — ABNORMAL HIGH (ref 250–450)
Lab: 6 % — ABNORMAL LOW (ref 16–45)

## 2018-10-22 ENCOUNTER — Encounter: Admit: 2018-10-22 | Discharge: 2018-10-22 | Payer: MEDICARE

## 2018-10-24 ENCOUNTER — Encounter: Admit: 2018-10-24 | Discharge: 2018-10-24 | Payer: MEDICARE

## 2018-10-24 ENCOUNTER — Inpatient Hospital Stay: Admit: 2018-10-24 | Discharge: 2018-10-28 | Disposition: A | Discharge status: Home / Self Care | Payer: MEDICARE

## 2018-10-24 DIAGNOSIS — K5730 Diverticulosis of large intestine without perforation or abscess without bleeding: ICD-10-CM

## 2018-10-24 DIAGNOSIS — E119 Type 2 diabetes mellitus without complications: ICD-10-CM

## 2018-10-24 DIAGNOSIS — R42 Dizziness and giddiness: ICD-10-CM

## 2018-10-24 DIAGNOSIS — R748 Abnormal levels of other serum enzymes: ICD-10-CM

## 2018-10-24 DIAGNOSIS — I10 Essential (primary) hypertension: ICD-10-CM

## 2018-10-24 DIAGNOSIS — R0600 Dyspnea, unspecified: ICD-10-CM

## 2018-10-24 DIAGNOSIS — R945 Abnormal results of liver function studies: Principal | ICD-10-CM

## 2018-10-24 DIAGNOSIS — K922 Gastrointestinal hemorrhage, unspecified: ICD-10-CM

## 2018-10-24 DIAGNOSIS — K760 Fatty (change of) liver, not elsewhere classified: ICD-10-CM

## 2018-10-24 DIAGNOSIS — J329 Chronic sinusitis, unspecified: ICD-10-CM

## 2018-10-24 DIAGNOSIS — E669 Obesity, unspecified: Secondary | ICD-10-CM

## 2018-10-24 DIAGNOSIS — R109 Unspecified abdominal pain: ICD-10-CM

## 2018-10-24 MED ORDER — ONDANSETRON HCL (PF) 4 MG/2 ML IJ SOLN
4 mg | INTRAVENOUS | 0 refills | Status: DC | PRN
Start: 2018-10-24 — End: 2018-10-25

## 2018-10-24 MED ORDER — BISACODYL 10 MG RE SUPP
10 mg | Freq: Two times a day (BID) | RECTAL | 0 refills | Status: DC | PRN
Start: 2018-10-24 — End: 2018-10-28

## 2018-10-24 MED ORDER — DULOXETINE 60 MG PO CPDR
60 mg | Freq: Every day | ORAL | 0 refills | Status: DC
Start: 2018-10-24 — End: 2018-10-28
  Administered 2018-10-25 – 2018-10-28 (×3): 60 mg via ORAL

## 2018-10-24 MED ORDER — PANTOPRAZOLE 40 MG IV SOLR
40 mg | Freq: Two times a day (BID) | INTRAVENOUS | 0 refills | Status: DC
Start: 2018-10-24 — End: 2018-10-27
  Administered 2018-10-25 – 2018-10-27 (×4): 40 mg via INTRAVENOUS

## 2018-10-24 MED ORDER — TRAMADOL 50 MG PO TAB
50 mg | Freq: Every day | ORAL | 0 refills | Status: DC | PRN
Start: 2018-10-24 — End: 2018-10-28

## 2018-10-24 MED ORDER — MELATONIN 5 MG PO TAB
5 mg | Freq: Every evening | ORAL | 0 refills | Status: DC | PRN
Start: 2018-10-24 — End: 2018-10-28

## 2018-10-24 MED ORDER — POLYETHYLENE GLYCOL 3350 17 GRAM PO PWPK
17 g | Freq: Every day | ORAL | 0 refills | Status: DC
Start: 2018-10-24 — End: 2018-10-28
  Administered 2018-10-25 – 2018-10-28 (×3): 17 g via ORAL

## 2018-10-24 MED ORDER — SODIUM CHLORIDE 0.65 % NA SPRA
1-2 | Freq: Two times a day (BID) | NASAL | 0 refills | Status: DC
Start: 2018-10-24 — End: 2018-10-28
  Administered 2018-10-25: 06:00:00 1 via NASAL

## 2018-10-24 MED ORDER — CHOLECALCIFEROL (VITAMIN D3) 1,000 UNIT (25 MCG) PO TAB
1000 [IU] | Freq: Every day | ORAL | 0 refills | Status: DC
Start: 2018-10-24 — End: 2018-10-28
  Administered 2018-10-28: 16:00:00 1000 [IU] via ORAL

## 2018-10-24 MED ORDER — ONDANSETRON HCL 4 MG PO TAB
4 mg | ORAL | 0 refills | Status: DC | PRN
Start: 2018-10-24 — End: 2018-10-25

## 2018-10-24 MED ORDER — ALBUMIN, HUMAN 25 % IV SOLP
50 g | Freq: Once | INTRAVENOUS | 0 refills | Status: CP
Start: 2018-10-24 — End: ?
  Administered 2018-10-25: 08:00:00 50 g via INTRAVENOUS

## 2018-10-24 MED ORDER — ACETAMINOPHEN 500 MG PO TAB
500 mg | ORAL | 0 refills | Status: DC | PRN
Start: 2018-10-24 — End: 2018-10-28
  Administered 2018-10-28 (×2): 500 mg via ORAL

## 2018-10-24 MED ORDER — ATORVASTATIN 10 MG PO TAB
10 mg | Freq: Every day | ORAL | 0 refills | Status: DC
Start: 2018-10-24 — End: 2018-10-28
  Administered 2018-10-25 – 2018-10-28 (×3): 10 mg via ORAL

## 2018-10-24 MED ORDER — CARVEDILOL 3.125 MG PO TAB
3.125 mg | Freq: Two times a day (BID) | ORAL | 0 refills | Status: DC
Start: 2018-10-24 — End: 2018-10-25
  Administered 2018-10-25: 15:00:00 3.125 mg via ORAL

## 2018-10-24 MED ORDER — PANTOPRAZOLE 40 MG PO TBEC
40 mg | Freq: Two times a day (BID) | ORAL | 0 refills | Status: CN
Start: 2018-10-24 — End: ?

## 2018-10-24 MED ORDER — PANTOPRAZOLE 40 MG IV SOLR
80 mg | Freq: Once | INTRAVENOUS | 0 refills | Status: CP
Start: 2018-10-24 — End: ?
  Administered 2018-10-25: 03:00:00 80 mg via INTRAVENOUS

## 2018-10-24 MED ORDER — CIPROFLOXACIN HCL 500 MG PO TAB
500 mg | Freq: Two times a day (BID) | ORAL | 0 refills | Status: DC
Start: 2018-10-24 — End: 2018-10-25
  Administered 2018-10-25: 06:00:00 500 mg via ORAL

## 2018-10-24 MED ORDER — INSULIN ASPART 100 UNIT/ML SC FLEXPEN
0-6 [IU] | Freq: Every day | SUBCUTANEOUS | 0 refills | Status: DC
Start: 2018-10-24 — End: 2018-10-28

## 2018-10-25 LAB — CBC
Lab: 2.7 M/UL — ABNORMAL LOW (ref 4.0–5.0)
Lab: 4.2 K/UL — ABNORMAL LOW (ref 4.5–11.0)
Lab: 6 g/dL — ABNORMAL LOW (ref 12.0–15.0)
Lab: 2.9 M/UL — ABNORMAL LOW (ref 4.0–5.0)
Lab: 21 % — ABNORMAL LOW (ref 36–45)
Lab: 22 pg — ABNORMAL LOW (ref 26–34)
Lab: 24 % — ABNORMAL HIGH (ref 11–15)
Lab: 32 g/dL (ref 32.0–36.0)
Lab: 39 10*3/uL — ABNORMAL LOW (ref 150–400)
Lab: 5.8 10*3/uL (ref 4.5–11.0)
Lab: 6.8 g/dL — ABNORMAL LOW (ref 12.0–15.0)
Lab: 70 FL — ABNORMAL LOW (ref 80–100)
Lab: 8.9 FL (ref 7–11)

## 2018-10-25 LAB — CBC AND DIFF
Lab: 0.1 10*3/uL (ref 0–0.20)
Lab: 0.5 10*3/uL — ABNORMAL HIGH (ref 0–0.45)
Lab: 5.7 10*3/uL (ref 4.5–11.0)

## 2018-10-25 LAB — MAGNESIUM: Lab: 1.8 mg/dL — ABNORMAL LOW (ref 1.6–2.6)

## 2018-10-25 LAB — RETICULOCYTE COUNT
Lab: 1.3 %
Lab: 3.1 % — ABNORMAL HIGH (ref 0.5–2.0)
Lab: 80 10*3/uL (ref 30–94)

## 2018-10-25 LAB — COMPREHENSIVE METABOLIC PANEL
Lab: 0.8 mg/dL — ABNORMAL LOW (ref 0.4–1.00)
Lab: 1.7 mg/dL — ABNORMAL HIGH (ref 0.3–1.2)
Lab: 11 U/L (ref 7–56)
Lab: 110 U/L (ref 25–110)
Lab: 139 MMOL/L — ABNORMAL LOW (ref 137–147)
Lab: 19 K/UL — ABNORMAL HIGH (ref 3–12)
Lab: 21 MMOL/L — ABNORMAL HIGH (ref 21–30)
Lab: 26 U/L (ref 7–40)
Lab: 3.3 g/dL — ABNORMAL LOW (ref 3.5–5.0)
Lab: 3.7 MMOL/L — CL (ref 3.5–5.1)
Lab: 6.5 g/dL (ref 6.0–8.0)
Lab: 74 mg/dL — ABNORMAL LOW (ref 70–100)
Lab: 8.8 mg/dL — ABNORMAL HIGH (ref 8.5–10.6)
Lab: 99 MMOL/L — ABNORMAL LOW (ref 98–110)
Lab: >60 mL/min (ref >60)
Lab: >60 mL/min (ref >60)
Lab: 0.7 mg/dL (ref 0.4–1.00)
Lab: 102 MMOL/L (ref 98–110)
Lab: 139 MMOL/L (ref 137–147)
Lab: 14 mg/dL (ref 7–25)
Lab: 27 U/L (ref 7–40)
Lab: 3.1 mg/dL — ABNORMAL HIGH (ref 0.3–1.2)
Lab: 6 g/dL (ref 6.0–8.0)
Lab: 74 mg/dL (ref 70–100)
Lab: 8.8 mg/dL (ref 8.5–10.6)
Lab: 140 MMOL/L — ABNORMAL LOW (ref 137–147)
Lab: 3.4 MMOL/L — ABNORMAL LOW (ref 3.5–5.1)
Lab: 82 mg/dL — ABNORMAL HIGH (ref 70–100)

## 2018-10-25 LAB — LDH-LACTATE DEHYDROGENASE: Lab: 134 U/L (ref 100–210)

## 2018-10-25 LAB — POC LACTATE: Lab: 7.3 MMOL/L — ABNORMAL HIGH (ref 0.5–2.0)

## 2018-10-25 LAB — PTT (APTT)
Lab: 33 s (ref 24.0–36.5)
Lab: 32 s — ABNORMAL HIGH (ref >60)

## 2018-10-25 LAB — BNP (B-TYPE NATRIURETIC PEPTI): Lab: 147 pg/mL — ABNORMAL HIGH (ref 0–100)

## 2018-10-25 LAB — LACTIC ACID(LACTATE)
Lab: 5.7 MMOL/L — ABNORMAL HIGH (ref 0.5–2.0)
Lab: 1.9 MMOL/L — ABNORMAL LOW (ref 0.5–2.0)

## 2018-10-25 LAB — TROPONIN-I: Lab: 0 ng/mL (ref 0.0–0.05)

## 2018-10-25 LAB — POC GLUCOSE
Lab: 78 mg/dL — ABNORMAL LOW (ref 70–100)
Lab: 86 mg/dL — ABNORMAL LOW (ref 70–100)
Lab: 90 mg/dL (ref 70–100)
Lab: 96 mg/dL (ref 70–100)

## 2018-10-25 LAB — HEMOGLOBIN & HEMATOCRIT
Lab: 18 % — ABNORMAL LOW (ref 36–45)
Lab: 6 g/dL — ABNORMAL LOW (ref 12.0–15.0)

## 2018-10-25 LAB — LIPASE: Lab: 60 U/L (ref 11–82)

## 2018-10-25 LAB — INFLUENZA A/B AND RSV PCR
Lab: NEGATIVE
Lab: NEGATIVE
Lab: NEGATIVE

## 2018-10-25 LAB — IRON + BINDING CAPACITY + %SAT+ FERRITIN
Lab: 565 ug/dL — ABNORMAL HIGH (ref >60)
Lab: <10 ug/dL — ABNORMAL LOW (ref >60)

## 2018-10-25 LAB — PROTIME INR (PT)
Lab: 1.3 ng/mL — ABNORMAL HIGH (ref 0.8–1.2)
Lab: 1.4 MMOL/L — ABNORMAL HIGH (ref 0.8–1.2)

## 2018-10-25 LAB — PHOSPHORUS: Lab: 3.3 mg/dL — ABNORMAL HIGH (ref 2.0–4.5)

## 2018-10-25 LAB — TSH WITH FREE T4 REFLEX: Lab: 3.5 uU/mL (ref >60)

## 2018-10-25 LAB — BETA HYDROXYBUTYRATE (KETONES): Lab: 0.9 MMOL/L — ABNORMAL HIGH (ref <0.3)

## 2018-10-25 MED ORDER — ONDANSETRON HCL (PF) 4 MG/2 ML IJ SOLN
4 mg | INTRAVENOUS | 0 refills | Status: DC | PRN
Start: 2018-10-25 — End: 2018-10-28
  Administered 2018-10-28: 02:00:00 4 mg via INTRAVENOUS

## 2018-10-25 MED ORDER — CIPROFLOXACIN HCL 500 MG PO TAB
500 mg | Freq: Every day | ORAL | 0 refills | Status: DC
Start: 2018-10-25 — End: 2018-10-28
  Administered 2018-10-26 – 2018-10-28 (×2): 500 mg via ORAL

## 2018-10-25 MED ORDER — DIPHENHYDRAMINE HCL 50 MG/ML IJ SOLN
25 mg | INTRAVENOUS | 0 refills | Status: DC | PRN
Start: 2018-10-25 — End: 2018-10-28

## 2018-10-25 MED ORDER — IRON SUCROSE 300 MG IRON/15 ML IV SOLN
300 mg | Freq: Every day | INTRAVENOUS | 0 refills | Status: DC
Start: 2018-10-25 — End: 2018-10-25

## 2018-10-25 MED ORDER — SODIUM CHLORIDE 0.9 % IV SOLP
INTRAVENOUS | 0 refills | Status: CN
Start: 2018-10-25 — End: ?

## 2018-10-25 MED ORDER — PHYTONADIONE (VITAMIN K1) 10 MG/ML IJ SOLN
10 mg | Freq: Every day | SUBCUTANEOUS | 0 refills | Status: CP
Start: 2018-10-25 — End: ?
  Administered 2018-10-25 – 2018-10-26 (×2): 10 mg via SUBCUTANEOUS

## 2018-10-25 MED ORDER — ONDANSETRON HCL 4 MG PO TAB
4 mg | ORAL | 0 refills | Status: DC | PRN
Start: 2018-10-25 — End: 2018-10-28

## 2018-10-25 MED ORDER — EPINEPHRINE HCL (PF) 1 MG/ML (1 ML) IJ SOLN
.3-.5 mg | INTRAMUSCULAR | 0 refills | Status: DC | PRN
Start: 2018-10-25 — End: 2018-10-28

## 2018-10-25 MED ORDER — IRON SUCROSE 300 MG IRON/15 ML IV SOLN
300 mg | Freq: Once | INTRAVENOUS | 0 refills | Status: DC
Start: 2018-10-25 — End: 2018-10-26

## 2018-10-26 LAB — POC GLUCOSE
Lab: 121 mg/dL — ABNORMAL HIGH (ref 70–100)
Lab: 122 mg/dL — ABNORMAL HIGH (ref 70–100)
Lab: 128 mg/dL — ABNORMAL HIGH (ref 70–100)
Lab: 129 mg/dL — ABNORMAL HIGH (ref 70–100)
Lab: 155 mg/dL — ABNORMAL HIGH (ref 70–100)

## 2018-10-26 LAB — HEMOGLOBIN & HEMATOCRIT
Lab: 24 % — ABNORMAL LOW (ref 36–45)
Lab: 8 g/dL — ABNORMAL LOW (ref 12.0–15.0)

## 2018-10-26 LAB — COMPREHENSIVE METABOLIC PANEL: Lab: 140 MMOL/L — ABNORMAL LOW (ref 137–147)

## 2018-10-26 LAB — CBC: Lab: 6.4 K/UL — ABNORMAL LOW (ref >60)

## 2018-10-26 LAB — MAGNESIUM: Lab: 1.9 mg/dL — ABNORMAL LOW (ref >60)

## 2018-10-26 LAB — PROTIME INR (PT): Lab: 1.5 MMOL/L — ABNORMAL HIGH (ref 0.8–1.2)

## 2018-10-26 LAB — FOLATE, SERUM: Lab: 21 ng/mL (ref >3.9)

## 2018-10-26 LAB — VITAMIN B12: Lab: 528 pg/mL (ref 180–914)

## 2018-10-26 LAB — PHOSPHORUS: Lab: 3.1 mg/dL — ABNORMAL HIGH (ref 2.0–4.5)

## 2018-10-26 MED ORDER — POTASSIUM CHLORIDE 20 MEQ PO TBTQ
60 meq | Freq: Once | ORAL | 0 refills | Status: CP
Start: 2018-10-26 — End: ?
  Administered 2018-10-26: 17:00:00 60 meq via ORAL

## 2018-10-27 ENCOUNTER — Encounter: Admit: 2018-10-27 | Discharge: 2018-10-27 | Payer: MEDICARE

## 2018-10-27 DIAGNOSIS — R42 Dizziness and giddiness: ICD-10-CM

## 2018-10-27 DIAGNOSIS — R109 Unspecified abdominal pain: ICD-10-CM

## 2018-10-27 DIAGNOSIS — R0600 Dyspnea, unspecified: ICD-10-CM

## 2018-10-27 DIAGNOSIS — I10 Essential (primary) hypertension: Secondary | ICD-10-CM

## 2018-10-27 DIAGNOSIS — E669 Obesity, unspecified: Secondary | ICD-10-CM

## 2018-10-27 DIAGNOSIS — R748 Abnormal levels of other serum enzymes: ICD-10-CM

## 2018-10-27 DIAGNOSIS — J329 Chronic sinusitis, unspecified: ICD-10-CM

## 2018-10-27 DIAGNOSIS — R945 Abnormal results of liver function studies: Principal | ICD-10-CM

## 2018-10-27 DIAGNOSIS — K5730 Diverticulosis of large intestine without perforation or abscess without bleeding: ICD-10-CM

## 2018-10-27 DIAGNOSIS — E119 Type 2 diabetes mellitus without complications: ICD-10-CM

## 2018-10-27 DIAGNOSIS — K760 Fatty (change of) liver, not elsewhere classified: ICD-10-CM

## 2018-10-27 LAB — HEMOGLOBIN & HEMATOCRIT
Lab: 24 % — ABNORMAL LOW (ref 36–45)
Lab: 8 g/dL — ABNORMAL LOW (ref 12.0–15.0)
Lab: 23 % — ABNORMAL LOW (ref 36–45)
Lab: 7.6 g/dL — ABNORMAL LOW (ref 12.0–15.0)
Lab: 25 % — ABNORMAL LOW (ref 36–45)
Lab: 8 g/dL — ABNORMAL LOW (ref 12.0–15.0)

## 2018-10-27 LAB — CBC: Lab: 5.9 K/UL — ABNORMAL HIGH (ref 4.5–11.0)

## 2018-10-27 LAB — MAGNESIUM: Lab: 2 mg/dL — ABNORMAL LOW (ref >60)

## 2018-10-27 LAB — PHOSPHORUS: Lab: 2.6 mg/dL — ABNORMAL LOW (ref >60)

## 2018-10-27 LAB — POC GLUCOSE
Lab: 143 mg/dL — ABNORMAL HIGH (ref 70–100)
Lab: 146 mg/dL — ABNORMAL HIGH (ref 70–100)
Lab: 135 mg/dL — ABNORMAL HIGH (ref 70–100)
Lab: 126 mg/dL — ABNORMAL HIGH (ref 70–100)
Lab: 131 mg/dL — ABNORMAL HIGH (ref 70–100)

## 2018-10-27 LAB — ZINC: Lab: 0.3 mg/dL — ABNORMAL LOW (ref 7–25)

## 2018-10-27 LAB — PERIPHERAL SMEAR

## 2018-10-27 LAB — COMPREHENSIVE METABOLIC PANEL: Lab: 139 MMOL/L — ABNORMAL LOW (ref 137–147)

## 2018-10-27 LAB — PROTIME INR (PT): Lab: 1.4 g/dL — ABNORMAL HIGH (ref 0.8–1.2)

## 2018-10-27 MED ORDER — ONDANSETRON HCL (PF) 4 MG/2 ML IJ SOLN
4 mg | Freq: Once | INTRAVENOUS | 0 refills | Status: CP
Start: 2018-10-27 — End: ?
  Administered 2018-10-27: 16:00:00 4 mg via INTRAVENOUS

## 2018-10-27 MED ORDER — DIPHENHYDRAMINE HCL 50 MG/ML IJ SOLN
25 mg | Freq: Once | INTRAVENOUS | 0 refills | Status: CN | PRN
Start: 2018-10-27 — End: ?

## 2018-10-27 MED ORDER — PROPOFOL INJ 10 MG/ML IV VIAL
0 refills | Status: DC
Start: 2018-10-27 — End: 2018-10-27
  Administered 2018-10-27: 16:00:00 10 mg via INTRAVENOUS
  Administered 2018-10-27: 16:00:00 20 mg via INTRAVENOUS
  Administered 2018-10-27: 16:00:00 10 mg via INTRAVENOUS
  Administered 2018-10-27 (×5): 20 mg via INTRAVENOUS
  Administered 2018-10-27 (×2): 10 mg via INTRAVENOUS
  Administered 2018-10-27: 16:00:00 20 mg via INTRAVENOUS
  Administered 2018-10-27 (×2): 10 mg via INTRAVENOUS

## 2018-10-27 MED ORDER — ZINC SULFATE 220 (50) MG PO CAP
220 mg | Freq: Every day | ORAL | 0 refills | Status: DC
Start: 2018-10-27 — End: 2018-10-28
  Administered 2018-10-28 (×2): 220 mg via ORAL

## 2018-10-27 MED ORDER — LACTATED RINGERS IV SOLP
1000 mL | INTRAVENOUS | 0 refills | Status: DC
Start: 2018-10-27 — End: 2018-10-28
  Administered 2018-10-27: 15:00:00 1000 mL via INTRAVENOUS
  Administered 2018-10-27: 15:00:00 1000.000 mL via INTRAVENOUS

## 2018-10-27 MED ORDER — PROMETHAZINE 25 MG/ML IJ SOLN
6.25 mg | INTRAVENOUS | 0 refills | Status: CN | PRN
Start: 2018-10-27 — End: ?

## 2018-10-27 MED ORDER — PANTOPRAZOLE 40 MG PO TBEC
40 mg | Freq: Every day | ORAL | 0 refills | Status: DC
Start: 2018-10-27 — End: 2018-10-28
  Administered 2018-10-28: 15:00:00 40 mg via ORAL

## 2018-10-27 MED ORDER — HALOPERIDOL LACTATE 5 MG/ML IJ SOLN
1 mg | Freq: Once | INTRAVENOUS | 0 refills | Status: CN | PRN
Start: 2018-10-27 — End: ?

## 2018-10-27 MED ORDER — LACTATED RINGERS IV SOLP
INTRAVENOUS | 0 refills | Status: CN
Start: 2018-10-27 — End: ?

## 2018-10-27 MED ORDER — FUROSEMIDE 80 MG PO TAB
80 mg | Freq: Every day | ORAL | 0 refills | Status: DC
Start: 2018-10-27 — End: 2018-10-28
  Administered 2018-10-28 (×2): 80 mg via ORAL

## 2018-10-27 MED ORDER — LACTULOSE 10 GRAM/15 ML PO SOLN
30 mL | Freq: Three times a day (TID) | ORAL | 0 refills | Status: DC
Start: 2018-10-27 — End: 2018-10-28
  Administered 2018-10-28 (×3): 20 g via ORAL

## 2018-10-28 ENCOUNTER — Inpatient Hospital Stay: Admit: 2018-10-25 | Discharge: 2018-10-25 | Payer: MEDICARE

## 2018-10-28 ENCOUNTER — Encounter: Admit: 2018-10-28 | Discharge: 2018-10-28 | Payer: MEDICARE

## 2018-10-28 ENCOUNTER — Inpatient Hospital Stay: Admit: 2018-10-27 | Discharge: 2018-10-27 | Payer: MEDICARE

## 2018-10-28 DIAGNOSIS — R040 Epistaxis: ICD-10-CM

## 2018-10-28 DIAGNOSIS — R188 Other ascites: ICD-10-CM

## 2018-10-28 DIAGNOSIS — Z7984 Long term (current) use of oral hypoglycemic drugs: ICD-10-CM

## 2018-10-28 DIAGNOSIS — K130 Diseases of lips: ICD-10-CM

## 2018-10-28 DIAGNOSIS — Z9049 Acquired absence of other specified parts of digestive tract: ICD-10-CM

## 2018-10-28 DIAGNOSIS — E119 Type 2 diabetes mellitus without complications: ICD-10-CM

## 2018-10-28 DIAGNOSIS — K219 Gastro-esophageal reflux disease without esophagitis: ICD-10-CM

## 2018-10-28 DIAGNOSIS — K766 Portal hypertension: ICD-10-CM

## 2018-10-28 DIAGNOSIS — Z90710 Acquired absence of both cervix and uterus: ICD-10-CM

## 2018-10-28 DIAGNOSIS — Z880 Allergy status to penicillin: ICD-10-CM

## 2018-10-28 DIAGNOSIS — E785 Hyperlipidemia, unspecified: ICD-10-CM

## 2018-10-28 DIAGNOSIS — K7460 Unspecified cirrhosis of liver: ICD-10-CM

## 2018-10-28 DIAGNOSIS — I10 Essential (primary) hypertension: ICD-10-CM

## 2018-10-28 DIAGNOSIS — R109 Unspecified abdominal pain: ICD-10-CM

## 2018-10-28 DIAGNOSIS — K7581 Nonalcoholic steatohepatitis (NASH): ICD-10-CM

## 2018-10-28 DIAGNOSIS — E872 Acidosis: ICD-10-CM

## 2018-10-28 DIAGNOSIS — K317 Polyp of stomach and duodenum: ICD-10-CM

## 2018-10-28 DIAGNOSIS — K921 Melena: Principal | ICD-10-CM

## 2018-10-28 DIAGNOSIS — R0600 Dyspnea, unspecified: ICD-10-CM

## 2018-10-28 DIAGNOSIS — E60 Dietary zinc deficiency: ICD-10-CM

## 2018-10-28 DIAGNOSIS — R748 Abnormal levels of other serum enzymes: ICD-10-CM

## 2018-10-28 DIAGNOSIS — G8929 Other chronic pain: ICD-10-CM

## 2018-10-28 DIAGNOSIS — D500 Iron deficiency anemia secondary to blood loss (chronic): Principal | ICD-10-CM

## 2018-10-28 DIAGNOSIS — I8510 Secondary esophageal varices without bleeding: ICD-10-CM

## 2018-10-28 DIAGNOSIS — K5730 Diverticulosis of large intestine without perforation or abscess without bleeding: ICD-10-CM

## 2018-10-28 DIAGNOSIS — D649 Anemia, unspecified: ICD-10-CM

## 2018-10-28 DIAGNOSIS — K760 Fatty (change of) liver, not elsewhere classified: ICD-10-CM

## 2018-10-28 DIAGNOSIS — J329 Chronic sinusitis, unspecified: ICD-10-CM

## 2018-10-28 DIAGNOSIS — R945 Abnormal results of liver function studies: Principal | ICD-10-CM

## 2018-10-28 DIAGNOSIS — E669 Obesity, unspecified: Secondary | ICD-10-CM

## 2018-10-28 LAB — CBC: Lab: 9.5 K/UL — ABNORMAL LOW (ref 4.5–11.0)

## 2018-10-28 LAB — POC GLUCOSE
Lab: 137 mg/dL — ABNORMAL HIGH (ref 70–100)
Lab: 115 mg/dL — ABNORMAL HIGH (ref 70–100)
Lab: 104 mg/dL — ABNORMAL HIGH (ref 70–100)

## 2018-10-28 LAB — PROTIME INR (PT): Lab: 1.4 g/dL — ABNORMAL HIGH (ref 0.8–1.2)

## 2018-10-28 LAB — PHOSPHORUS: Lab: 3.2 mg/dL (ref >60)

## 2018-10-28 LAB — MAGNESIUM: Lab: 2 mg/dL — ABNORMAL LOW (ref >60)

## 2018-10-28 LAB — COMPREHENSIVE METABOLIC PANEL: Lab: 141 MMOL/L — ABNORMAL HIGH (ref >60)

## 2018-10-28 MED ORDER — ZINC SULFATE 220 (50) MG PO CAP
220 mg | ORAL_CAPSULE | Freq: Every day | ORAL | 3 refills | Status: AC
Start: 2018-10-28 — End: 2019-11-18

## 2018-10-28 MED ORDER — PANTOPRAZOLE 40 MG PO TBEC
40 mg | ORAL_TABLET | Freq: Every day | ORAL | 3 refills | 90.00000 days | Status: AC
Start: 2018-10-28 — End: 2019-04-01

## 2018-10-29 ENCOUNTER — Encounter: Admit: 2018-10-29 | Discharge: 2018-10-29 | Payer: MEDICARE

## 2018-10-30 ENCOUNTER — Encounter: Admit: 2018-10-30 | Discharge: 2018-10-30 | Payer: MEDICARE

## 2018-10-30 DIAGNOSIS — K7460 Unspecified cirrhosis of liver: Principal | ICD-10-CM

## 2018-10-31 LAB — CULTURE-BLOOD W/SENSITIVITY

## 2018-11-03 ENCOUNTER — Encounter: Admit: 2018-11-03 | Discharge: 2018-11-03 | Payer: MEDICARE

## 2018-11-07 ENCOUNTER — Encounter: Admit: 2018-11-07 | Discharge: 2018-11-07 | Payer: MEDICARE

## 2018-11-07 DIAGNOSIS — K7469 Other cirrhosis of liver: ICD-10-CM

## 2018-11-07 DIAGNOSIS — M899 Disorder of bone, unspecified: ICD-10-CM

## 2018-11-07 DIAGNOSIS — R9389 Abnormal findings on diagnostic imaging of other specified body structures: ICD-10-CM

## 2018-11-07 DIAGNOSIS — Z0389 Encounter for observation for other suspected diseases and conditions ruled out: ICD-10-CM

## 2018-11-07 DIAGNOSIS — K760 Fatty (change of) liver, not elsewhere classified: Principal | ICD-10-CM

## 2018-11-07 DIAGNOSIS — R978 Other abnormal tumor markers: ICD-10-CM

## 2018-11-07 DIAGNOSIS — Z114 Encounter for screening for human immunodeficiency virus [HIV]: ICD-10-CM

## 2018-11-13 ENCOUNTER — Encounter: Admit: 2018-11-13 | Discharge: 2018-11-13 | Payer: MEDICARE

## 2018-11-13 DIAGNOSIS — D500 Iron deficiency anemia secondary to blood loss (chronic): Principal | ICD-10-CM

## 2018-11-17 ENCOUNTER — Encounter: Admit: 2018-11-17 | Discharge: 2018-11-17 | Payer: MEDICARE

## 2018-11-18 ENCOUNTER — Encounter: Admit: 2018-11-18 | Discharge: 2018-11-18 | Payer: MEDICARE

## 2018-11-18 DIAGNOSIS — D500 Iron deficiency anemia secondary to blood loss (chronic): Principal | ICD-10-CM

## 2018-11-18 LAB — CBC AND DIFF
Lab: 160
Lab: 18
Lab: 23 — ABNORMAL LOW (ref 27.0–31.0)
Lab: 24 — ABNORMAL HIGH (ref 11.5–14.5)
Lab: 28 — ABNORMAL LOW (ref 37.0–47.0)
Lab: 3.5 — ABNORMAL LOW (ref 4.20–5.40)
Lab: 3.9
Lab: 4.3
Lab: 66
Lab: 79 — ABNORMAL LOW (ref 80.0–99.0)
Lab: 8.6
Lab: 0.1
Lab: 0.3 s — ABNORMAL HIGH (ref 9.0–11.5)
Lab: 0.6 — ABNORMAL HIGH
Lab: 1.2
Lab: 1.9
Lab: 29 — ABNORMAL LOW (ref 33.0–37.0)
Lab: 6.4
Lab: 8.2 — ABNORMAL LOW (ref 12.0–16.0)

## 2018-12-01 ENCOUNTER — Encounter: Admit: 2018-12-01 | Discharge: 2018-12-01 | Payer: MEDICARE

## 2018-12-03 ENCOUNTER — Encounter: Admit: 2018-12-03 | Discharge: 2018-12-03 | Payer: MEDICARE

## 2018-12-15 ENCOUNTER — Ambulatory Visit: Admit: 2018-12-15 | Discharge: 2018-12-15 | Payer: MEDICARE

## 2018-12-15 ENCOUNTER — Encounter: Admit: 2018-12-15 | Discharge: 2018-12-15 | Payer: MEDICARE

## 2018-12-15 DIAGNOSIS — K760 Fatty (change of) liver, not elsewhere classified: ICD-10-CM

## 2018-12-15 DIAGNOSIS — J329 Chronic sinusitis, unspecified: Secondary | ICD-10-CM

## 2018-12-15 DIAGNOSIS — R945 Abnormal results of liver function studies: Secondary | ICD-10-CM

## 2018-12-15 DIAGNOSIS — R978 Other abnormal tumor markers: ICD-10-CM

## 2018-12-15 DIAGNOSIS — R0600 Dyspnea, unspecified: Secondary | ICD-10-CM

## 2018-12-15 DIAGNOSIS — K921 Melena: ICD-10-CM

## 2018-12-15 DIAGNOSIS — Z01818 Encounter for other preprocedural examination: ICD-10-CM

## 2018-12-15 DIAGNOSIS — Z780 Asymptomatic menopausal state: ICD-10-CM

## 2018-12-15 DIAGNOSIS — E669 Obesity, unspecified: Secondary | ICD-10-CM

## 2018-12-15 DIAGNOSIS — K5730 Diverticulosis of large intestine without perforation or abscess without bleeding: Secondary | ICD-10-CM

## 2018-12-15 DIAGNOSIS — I10 Essential (primary) hypertension: Secondary | ICD-10-CM

## 2018-12-15 DIAGNOSIS — R109 Unspecified abdominal pain: Secondary | ICD-10-CM

## 2018-12-15 DIAGNOSIS — R748 Abnormal levels of other serum enzymes: Secondary | ICD-10-CM

## 2018-12-15 DIAGNOSIS — D649 Anemia, unspecified: ICD-10-CM

## 2018-12-15 DIAGNOSIS — R531 Weakness: ICD-10-CM

## 2018-12-15 DIAGNOSIS — E119 Type 2 diabetes mellitus without complications: Secondary | ICD-10-CM

## 2018-12-15 DIAGNOSIS — Z114 Encounter for screening for human immunodeficiency virus [HIV]: ICD-10-CM

## 2018-12-15 DIAGNOSIS — R9389 Abnormal findings on diagnostic imaging of other specified body structures: ICD-10-CM

## 2018-12-15 LAB — CMV AB IGG: Lab: POSITIVE mg/dL (ref 0.4–1.00)

## 2018-12-15 LAB — CBC AND DIFF
Lab: 0.1 10*3/uL (ref 0–0.20)
Lab: 0.2 10*3/uL (ref 0–0.45)
Lab: 0.4 10*3/uL (ref 0–0.80)
Lab: 1.1 10*3/uL (ref 1.0–4.8)
Lab: 166 10*3/uL (ref 150–400)
Lab: 19 % — ABNORMAL HIGH (ref 11–15)
Lab: 2 % (ref 0–2)
Lab: 2.8 10*3/uL (ref 1.8–7.0)
Lab: 21 % — ABNORMAL LOW (ref 36–45)
Lab: 21 pg — ABNORMAL LOW (ref >60)
Lab: 24 % (ref 24–44)
Lab: 30 g/dL — ABNORMAL LOW (ref >60)
Lab: 4 % (ref 0–5)
Lab: 4.6 K/UL — ABNORMAL HIGH (ref 4.5–11.0)
Lab: 61 % (ref 41–77)
Lab: 9 % (ref 4–12)
Lab: 9 FL (ref 7–11)

## 2018-12-15 LAB — GGTP: Lab: 70 U/L — ABNORMAL HIGH (ref 9–64)

## 2018-12-15 LAB — CARBON MONOXIDE,BG: Lab: 2.5 % — ABNORMAL HIGH (ref <2.0)

## 2018-12-15 LAB — IRON + BINDING CAPACITY + %SAT+ FERRITIN
Lab: 17 ug/dL — ABNORMAL LOW (ref 50–160)
Lab: 565 ug/dL — ABNORMAL HIGH (ref 270–380)
Lab: 9 ng/mL — ABNORMAL LOW (ref 10–200)

## 2018-12-15 LAB — LIPID PROFILE
Lab: 15 mg/dL (ref 98–110)
Lab: 37 mg/dL — ABNORMAL LOW (ref >40)
Lab: 57 mg/dL (ref 20–32)
Lab: 76 mg/dL (ref <150)
Lab: 94 mg/dL (ref <200)

## 2018-12-15 LAB — MISC REFERENCE TEST

## 2018-12-15 LAB — PTT (APTT): Lab: 32 s (ref 24.0–36.5)

## 2018-12-15 LAB — TRANSFERRIN: Lab: 379 mg/dL — ABNORMAL HIGH (ref 185–336)

## 2018-12-15 LAB — BLOOD GASES, ARTERIAL
Lab: 28 MMOL/L — ABNORMAL HIGH (ref 21–28)
Lab: 28 mmHg — ABNORMAL LOW (ref 35–45)
Lab: 4.8 MMOL/L
Lab: 7.5 U/L — ABNORMAL HIGH (ref 7.35–7.45)

## 2018-12-15 LAB — COMPREHENSIVE METABOLIC PANEL
Lab: 1.7 mg/dL — ABNORMAL HIGH (ref 0.3–1.2)
Lab: 112 mg/dL — ABNORMAL HIGH (ref 70–100)
Lab: 143 MMOL/L (ref 137–147)
Lab: 18 mg/dL (ref 7–25)
Lab: 28 U/L — ABNORMAL LOW (ref 7–40)
Lab: 3.2 MMOL/L — ABNORMAL LOW (ref 3.5–5.1)
Lab: 3.7 g/dL (ref 3.5–5.0)
Lab: 30 MMOL/L — ABNORMAL LOW (ref 21–30)
Lab: 7 g/dL (ref 6.0–8.0)
Lab: 9.3 mg/dL (ref 8.5–10.6)

## 2018-12-15 LAB — HEMOGLOBIN & HEMATOCRIT, BG
Lab: 19 % — ABNORMAL LOW (ref 36–45)
Lab: 6.2 g/dL — ABNORMAL LOW (ref 12.0–15.0)

## 2018-12-15 LAB — EPSTEIN BARR  PANEL(EBV)
Lab: POSITIVE
Lab: POSITIVE
Lab: NEGATIVE
Lab: POSITIVE

## 2018-12-15 LAB — SYPHILIS AB SCREEN: Lab: NEGATIVE

## 2018-12-15 LAB — TOTAL THYROXINE T4: Lab: 13 ug/dL — ABNORMAL HIGH (ref 6.1–12.3)

## 2018-12-15 LAB — HIV 1& 2 AG-AB SCRN W REFLEX HIV 1 PCR QUANT: Lab: NEGATIVE g/dL — ABNORMAL LOW (ref 13.5–16.5)

## 2018-12-15 LAB — HEPATITIS A IGG: Lab: POSITIVE

## 2018-12-15 LAB — METHEMOGLOBIN: Lab: 0.7 % (ref <1.5)

## 2018-12-15 LAB — HEPATITIS B CORE AB TOT (IGG+IGM): Lab: NEGATIVE

## 2018-12-15 LAB — AMYLASE: Lab: 34 U/L — ABNORMAL LOW (ref 24–100)

## 2018-12-15 LAB — ALPHA FETO PROTEIN (AFP): Lab: 3.4 ng/mL (ref 0.0–15.0)

## 2018-12-15 LAB — RUBELLA AB IGG

## 2018-12-15 LAB — VARICELLA ZOSTER AB IGG: Lab: POSITIVE

## 2018-12-15 LAB — 25-OH VITAMIN D (D2 + D3): Lab: 84 ng/mL — ABNORMAL HIGH (ref 30–80)

## 2018-12-15 LAB — HEPATITIS B SURFACE AG: Lab: NEGATIVE K/UL — ABNORMAL LOW (ref 1.8–7.0)

## 2018-12-15 LAB — LDH-LACTATE DEHYDROGENASE: Lab: 123 U/L (ref <100)

## 2018-12-15 LAB — RHEUMATOID FACTOR (RF): Lab: <10 [IU]/mL (ref <25)

## 2018-12-15 LAB — HEPATITIS A IGM: Lab: NEGATIVE

## 2018-12-15 LAB — HEPATITIS B SURFACE AB: Lab: 116 m[IU]/mL (ref 0–0.45)

## 2018-12-15 LAB — PROTIME INR (PT): Lab: 1.3 — ABNORMAL HIGH (ref 0.8–1.2)

## 2018-12-15 LAB — MAGNESIUM: Lab: 2.1 mg/dL (ref 1.6–2.6)

## 2018-12-15 LAB — CMV AB IGM: Lab: NEGATIVE MMOL/L (ref 98–110)

## 2018-12-15 LAB — ALCOHOL LEVEL: Lab: <10 mg/dL — ABNORMAL HIGH (ref 0.70–1.30)

## 2018-12-15 LAB — HEPATITIS C ANTIBODY W REFLEX HCV PCR QUANT: Lab: NEGATIVE % — ABNORMAL HIGH (ref 11–15)

## 2018-12-15 LAB — HERPES SIMPLEX IGG AB (HSV IGG): Lab: POSITIVE % — AB (ref 28–42)

## 2018-12-15 LAB — CA19.9: Lab: 56 U/mL — ABNORMAL HIGH (ref <35)

## 2018-12-15 MED ORDER — SODIUM CHLORIDE 0.9 % IV SOLP
500 mL | Freq: Once | INTRAVENOUS | 0 refills | Status: CP
Start: 2018-12-15 — End: ?
  Administered 2018-12-16: 07:00:00 500 mL via INTRAVENOUS

## 2018-12-15 MED ORDER — PANTOPRAZOLE 40 MG IV SOLR
80 mg | Freq: Once | INTRAVENOUS | 0 refills | Status: CP
Start: 2018-12-15 — End: ?
  Administered 2018-12-16: 04:00:00 80 mg via INTRAVENOUS

## 2018-12-15 MED ORDER — OCTREOTIDE IV DRIP
50 ug/h | INTRAVENOUS | 0 refills | Status: DC
Start: 2018-12-15 — End: 2018-12-17
  Administered 2018-12-16 – 2018-12-17 (×6): 50 ug/h via INTRAVENOUS

## 2018-12-15 MED ORDER — LACTATED RINGERS IV SOLP
1000 mL | Freq: Once | INTRAVENOUS | 0 refills | Status: DC
Start: 2018-12-15 — End: 2018-12-16

## 2018-12-15 MED ORDER — OCTREOTIDE (SANDOSTATIN) BOLUS FOR CONTINUOUS INFUSION
50 ug | Freq: Once | INTRAVENOUS | 0 refills | Status: CP
Start: 2018-12-15 — End: ?

## 2018-12-16 ENCOUNTER — Encounter: Admit: 2018-12-16 | Discharge: 2018-12-16 | Payer: MEDICARE

## 2018-12-16 ENCOUNTER — Inpatient Hospital Stay: Admit: 2018-12-16 | Discharge: 2018-12-16 | Payer: MEDICARE

## 2018-12-16 DIAGNOSIS — R531 Weakness: ICD-10-CM

## 2018-12-16 DIAGNOSIS — J329 Chronic sinusitis, unspecified: Secondary | ICD-10-CM

## 2018-12-16 DIAGNOSIS — K5730 Diverticulosis of large intestine without perforation or abscess without bleeding: Secondary | ICD-10-CM

## 2018-12-16 DIAGNOSIS — R0600 Dyspnea, unspecified: Secondary | ICD-10-CM

## 2018-12-16 DIAGNOSIS — R945 Abnormal results of liver function studies: Secondary | ICD-10-CM

## 2018-12-16 DIAGNOSIS — K760 Fatty (change of) liver, not elsewhere classified: Secondary | ICD-10-CM

## 2018-12-16 DIAGNOSIS — R109 Unspecified abdominal pain: Secondary | ICD-10-CM

## 2018-12-16 DIAGNOSIS — E119 Type 2 diabetes mellitus without complications: Secondary | ICD-10-CM

## 2018-12-16 DIAGNOSIS — I10 Essential (primary) hypertension: Secondary | ICD-10-CM

## 2018-12-16 DIAGNOSIS — E669 Obesity, unspecified: Secondary | ICD-10-CM

## 2018-12-16 DIAGNOSIS — R748 Abnormal levels of other serum enzymes: Secondary | ICD-10-CM

## 2018-12-16 LAB — COMPREHENSIVE METABOLIC PANEL
Lab: 0.8 mg/dL — ABNORMAL LOW (ref 0.4–1.00)
Lab: 1.7 mg/dL — ABNORMAL HIGH (ref 0.3–1.2)
Lab: 121 U/L — ABNORMAL HIGH (ref 25–110)
Lab: 141 MMOL/L — ABNORMAL LOW (ref 137–147)
Lab: 3.6 g/dL (ref 3.5–5.0)
Lab: 84 mg/dL — ABNORMAL LOW (ref 70–100)
Lab: 9.3 mg/dL — ABNORMAL HIGH (ref 8.5–10.6)
Lab: 99 MMOL/L — ABNORMAL LOW (ref 98–110)
Lab: >60 mL/min (ref >60)
Lab: 143 MMOL/L — ABNORMAL LOW (ref 137–147)
Lab: 3.8 MMOL/L — ABNORMAL HIGH (ref 3.5–5.1)
Lab: 31 U/L — ABNORMAL LOW (ref 7–40)
Lab: 9.1 mg/dL — ABNORMAL HIGH (ref 8.5–10.6)

## 2018-12-16 LAB — LACTIC ACID(LACTATE): Lab: 2.7 MMOL/L — ABNORMAL HIGH (ref 0.5–2.0)

## 2018-12-16 LAB — CBC AND DIFF
Lab: 1.5 10*3/uL (ref >60)
Lab: 11 % (ref 4–12)
Lab: 21 pg — ABNORMAL LOW (ref 26–34)
Lab: 5.3 10*3/uL (ref 4.5–11.0)
Lab: 6.3 g/dL — ABNORMAL LOW (ref 12.0–15.0)
Lab: 0.2 K/UL — ABNORMAL HIGH (ref >60)
Lab: 3.7 10*3/uL — ABNORMAL LOW (ref 4.5–11.0)
Lab: 9 FL — ABNORMAL LOW (ref 7–11)

## 2018-12-16 LAB — POC GLUCOSE
Lab: 123 mg/dL — ABNORMAL HIGH (ref 70–100)
Lab: 119 mg/dL — ABNORMAL HIGH (ref 70–100)
Lab: 118 mg/dL — ABNORMAL HIGH (ref 70–100)
Lab: 125 mg/dL — ABNORMAL HIGH (ref 70–100)

## 2018-12-16 LAB — MAGNESIUM: Lab: 2.1 mg/dL — ABNORMAL LOW (ref 1.6–2.6)

## 2018-12-16 LAB — ZINC: Lab: 0.6 — ABNORMAL LOW

## 2018-12-16 LAB — LIPASE: Lab: 53 U/L (ref 11–82)

## 2018-12-16 LAB — POC TROPONIN: Lab: 0 ng/mL (ref 0.00–0.05)

## 2018-12-16 LAB — BNP POC ER: Lab: 40 pg/mL (ref 0–100)

## 2018-12-16 LAB — POC LACTATE
Lab: 8 MMOL/L — ABNORMAL HIGH (ref 0.5–2.0)
Lab: 8.1 MMOL/L — ABNORMAL HIGH (ref 0.5–2.0)
Lab: 5 MMOL/L — ABNORMAL HIGH (ref 0.5–2.0)

## 2018-12-16 MED ORDER — CIPROFLOXACIN HCL 500 MG PO TAB
500 mg | Freq: Once | ORAL | 0 refills | Status: CP
Start: 2018-12-16 — End: ?
  Administered 2018-12-16: 11:00:00 500 mg via ORAL

## 2018-12-16 MED ORDER — CIPROFLOXACIN HCL 500 MG PO TAB
500 mg | Freq: Two times a day (BID) | ORAL | 0 refills | Status: DC
Start: 2018-12-16 — End: 2018-12-18
  Administered 2018-12-17 – 2018-12-18 (×4): 500 mg via ORAL

## 2018-12-16 MED ORDER — MELATONIN 3 MG PO TAB
3 mg | Freq: Every evening | ORAL | 0 refills | Status: DC | PRN
Start: 2018-12-16 — End: 2018-12-18

## 2018-12-16 MED ORDER — POLYETHYLENE GLYCOL 3350 17 GRAM PO PWPK
1 | Freq: Two times a day (BID) | ORAL | 0 refills | Status: DC | PRN
Start: 2018-12-16 — End: 2018-12-18
  Administered 2018-12-17 (×2): 17 g via ORAL

## 2018-12-16 MED ORDER — PROPOFOL INJ 10 MG/ML IV VIAL
0 refills | Status: DC
Start: 2018-12-16 — End: 2018-12-16
  Administered 2018-12-16: 19:00:00 20 mg via INTRAVENOUS
  Administered 2018-12-16 (×2): 40 mg via INTRAVENOUS
  Administered 2018-12-16: 19:00:00 20 mg via INTRAVENOUS
  Administered 2018-12-16: 18:00:00 40 mg via INTRAVENOUS

## 2018-12-16 MED ORDER — HALOPERIDOL LACTATE 5 MG/ML IJ SOLN
1 mg | Freq: Once | INTRAVENOUS | 0 refills | Status: CN | PRN
Start: 2018-12-16 — End: ?

## 2018-12-16 MED ORDER — FENTANYL CITRATE (PF) 50 MCG/ML IJ SOLN
25 ug | INTRAVENOUS | 0 refills | Status: CN | PRN
Start: 2018-12-16 — End: ?

## 2018-12-16 MED ORDER — SODIUM CHLORIDE 0.9 % IV SOLP
INTRAVENOUS | 0 refills | Status: DC
Start: 2018-12-16 — End: 2018-12-17
  Administered 2018-12-16: 17:00:00 1000 mL via INTRAVENOUS

## 2018-12-16 MED ORDER — ATORVASTATIN 10 MG PO TAB
10 mg | Freq: Every day | ORAL | 0 refills | Status: DC
Start: 2018-12-16 — End: 2018-12-18
  Administered 2018-12-17: 15:00:00 10 mg via ORAL

## 2018-12-16 MED ORDER — ZINC SULFATE 220 (50) MG PO CAP
220 mg | Freq: Every day | ORAL | 0 refills | Status: DC
Start: 2018-12-16 — End: 2018-12-18
  Administered 2018-12-17 – 2018-12-18 (×2): 220 mg via ORAL

## 2018-12-16 MED ORDER — CHOLECALCIFEROL (VITAMIN D3) 25 MCG (1,000 UNIT) PO TAB
1000 [IU] | Freq: Every day | ORAL | 0 refills | Status: DC
Start: 2018-12-16 — End: 2018-12-18
  Administered 2018-12-17 – 2018-12-18 (×2): 1000 [IU] via ORAL

## 2018-12-16 MED ORDER — PANTOPRAZOLE 40 MG IV SOLR
40 mg | Freq: Two times a day (BID) | INTRAVENOUS | 0 refills | Status: DC
Start: 2018-12-16 — End: 2018-12-17
  Administered 2018-12-16 – 2018-12-17 (×3): 40 mg via INTRAVENOUS

## 2018-12-16 MED ORDER — SODIUM CHLORIDE 0.9 % IV SOLP
0 refills | Status: DC
Start: 2018-12-16 — End: 2018-12-16
  Administered 2018-12-16: 18:00:00 via INTRAVENOUS

## 2018-12-16 MED ORDER — PROPOFOL 10 MG/ML IV EMUL 20 ML (INFUSION)(AM)(OR)
INTRAVENOUS | 0 refills | Status: DC
Start: 2018-12-16 — End: 2018-12-16
  Administered 2018-12-16: 18:00:00 120 ug/kg/min via INTRAVENOUS

## 2018-12-16 MED ORDER — TRAMADOL 50 MG PO TAB
50 mg | ORAL | 0 refills | Status: DC | PRN
Start: 2018-12-16 — End: 2018-12-18

## 2018-12-16 MED ORDER — INSULIN ASPART 100 UNIT/ML SC FLEXPEN
0-6 [IU] | Freq: Before meals | SUBCUTANEOUS | 0 refills | Status: DC
Start: 2018-12-16 — End: 2018-12-18
  Administered 2018-12-17: 18:00:00 1 [IU] via SUBCUTANEOUS

## 2018-12-16 MED ORDER — LIDOCAINE (PF) 200 MG/10 ML (2 %) IJ SYRG
0 refills | Status: DC
Start: 2018-12-16 — End: 2018-12-16
  Administered 2018-12-16: 18:00:00 80 mg via INTRAVENOUS

## 2018-12-16 MED ORDER — PROMETHAZINE 25 MG PO TAB
25 mg | ORAL | 0 refills | Status: DC | PRN
Start: 2018-12-16 — End: 2018-12-18

## 2018-12-16 MED ORDER — DULOXETINE 60 MG PO CPDR
60 mg | Freq: Every day | ORAL | 0 refills | Status: DC
Start: 2018-12-16 — End: 2018-12-18
  Administered 2018-12-17 – 2018-12-18 (×2): 60 mg via ORAL

## 2018-12-17 ENCOUNTER — Encounter: Admit: 2018-12-17 | Discharge: 2018-12-17 | Payer: MEDICARE

## 2018-12-17 DIAGNOSIS — J329 Chronic sinusitis, unspecified: Secondary | ICD-10-CM

## 2018-12-17 DIAGNOSIS — K760 Fatty (change of) liver, not elsewhere classified: Secondary | ICD-10-CM

## 2018-12-17 DIAGNOSIS — R748 Abnormal levels of other serum enzymes: Secondary | ICD-10-CM

## 2018-12-17 DIAGNOSIS — K5730 Diverticulosis of large intestine without perforation or abscess without bleeding: Secondary | ICD-10-CM

## 2018-12-17 DIAGNOSIS — R531 Weakness: ICD-10-CM

## 2018-12-17 DIAGNOSIS — R945 Abnormal results of liver function studies: Secondary | ICD-10-CM

## 2018-12-17 DIAGNOSIS — E669 Obesity, unspecified: Secondary | ICD-10-CM

## 2018-12-17 DIAGNOSIS — I10 Essential (primary) hypertension: Secondary | ICD-10-CM

## 2018-12-17 DIAGNOSIS — E119 Type 2 diabetes mellitus without complications: Secondary | ICD-10-CM

## 2018-12-17 DIAGNOSIS — R0600 Dyspnea, unspecified: Secondary | ICD-10-CM

## 2018-12-17 DIAGNOSIS — R109 Unspecified abdominal pain: Secondary | ICD-10-CM

## 2018-12-17 LAB — CBC
Lab: 27 % — ABNORMAL LOW (ref 36–45)
Lab: 4.1 10*3/uL — ABNORMAL LOW (ref 4.5–11.0)
Lab: 73 FL — ABNORMAL LOW (ref 80–100)
Lab: 8.7 FL — ABNORMAL HIGH (ref 7–11)
Lab: 8.8 g/dL — ABNORMAL LOW (ref 12.0–15.0)
Lab: 3.4 M/UL — ABNORMAL LOW (ref 4.0–5.0)
Lab: 8.5 FL — ABNORMAL LOW (ref >60)

## 2018-12-17 LAB — TOXICOLOGY,SERUM
Lab: NEGATIVE
Lab: NEGATIVE
Lab: NEGATIVE
Lab: NEGATIVE
Lab: NEGATIVE
Lab: NEGATIVE
Lab: NEGATIVE
Lab: NEGATIVE
Lab: NEGATIVE
Lab: NEGATIVE
Lab: NEGATIVE

## 2018-12-17 LAB — POC GLUCOSE
Lab: 150 mg/dL — ABNORMAL HIGH (ref 70–100)
Lab: 162 mg/dL — ABNORMAL HIGH (ref 70–100)
Lab: 189 mg/dL — ABNORMAL HIGH (ref 70–100)
Lab: 111 mg/dL — ABNORMAL HIGH (ref 70–100)

## 2018-12-17 LAB — BASIC METABOLIC PANEL: Lab: 131 mg/dL — ABNORMAL HIGH (ref 70–100)

## 2018-12-17 LAB — T SPOT TB (QUANTIFERON TB)
Lab: 0
Lab: 0
Lab: NEGATIVE

## 2018-12-17 LAB — HEMOGLOBIN
Lab: 7.9 g/dL — ABNORMAL LOW (ref 12.0–15.0)
Lab: 8.7 g/dL — ABNORMAL LOW (ref >60)

## 2018-12-17 MED ORDER — PANTOPRAZOLE 40 MG PO TBEC
40 mg | Freq: Every day | ORAL | 0 refills | Status: DC
Start: 2018-12-17 — End: 2018-12-18

## 2018-12-17 MED ORDER — LACTULOSE 10 GRAM/15 ML PO SOLN
30 mL | Freq: Three times a day (TID) | ORAL | 0 refills | Status: DC
Start: 2018-12-17 — End: 2018-12-18
  Administered 2018-12-17 – 2018-12-18 (×3): 20 g via ORAL

## 2018-12-17 MED ORDER — SPIRONOLACTONE 50 MG PO TAB
50 mg | Freq: Every day | ORAL | 0 refills | Status: DC
Start: 2018-12-17 — End: 2018-12-18
  Administered 2018-12-17 – 2018-12-18 (×2): 50 mg via ORAL

## 2018-12-17 MED ORDER — POTASSIUM CHLORIDE IN WATER 10 MEQ/50 ML IV PGBK
10 meq | INTRAVENOUS | 0 refills | Status: CP
Start: 2018-12-17 — End: ?
  Administered 2018-12-17 – 2018-12-18 (×4): 10 meq via INTRAVENOUS

## 2018-12-17 MED ORDER — FUROSEMIDE 40 MG PO TAB
40 mg | Freq: Every day | ORAL | 0 refills | Status: DC
Start: 2018-12-17 — End: 2018-12-18
  Administered 2018-12-17 – 2018-12-18 (×2): 40 mg via ORAL

## 2018-12-17 MED ADMIN — SODIUM CHLORIDE 0.9 % IV SOLP [27838]: 1000 mL | INTRAVENOUS | @ 23:00:00 | Stop: 2018-12-17 | NDC 00338004904

## 2018-12-18 ENCOUNTER — Ambulatory Visit: Admit: 2018-12-15 | Discharge: 2018-12-15 | Payer: MEDICARE

## 2018-12-18 ENCOUNTER — Inpatient Hospital Stay: Admit: 2018-12-16 | Discharge: 2018-12-16 | Payer: MEDICARE

## 2018-12-18 ENCOUNTER — Ambulatory Visit: Admit: 2018-12-15 | Discharge: 2018-12-16 | Payer: MEDICARE

## 2018-12-18 ENCOUNTER — Encounter: Admit: 2018-12-18 | Discharge: 2018-12-18 | Payer: MEDICARE

## 2018-12-18 ENCOUNTER — Inpatient Hospital Stay: Admit: 2018-12-17 | Discharge: 2018-12-17 | Payer: MEDICARE

## 2018-12-18 ENCOUNTER — Ambulatory Visit: Admit: 2018-12-16 | Discharge: 2018-12-18 | Disposition: A | Discharge status: Home / Self Care | Payer: MEDICARE

## 2018-12-18 DIAGNOSIS — D62 Acute posthemorrhagic anemia: Secondary | ICD-10-CM

## 2018-12-18 DIAGNOSIS — Z880 Allergy status to penicillin: Secondary | ICD-10-CM

## 2018-12-18 DIAGNOSIS — K766 Portal hypertension: Secondary | ICD-10-CM

## 2018-12-18 DIAGNOSIS — E872 Acidosis: Secondary | ICD-10-CM

## 2018-12-18 DIAGNOSIS — Z9889 Other specified postprocedural states: Secondary | ICD-10-CM

## 2018-12-18 DIAGNOSIS — Z90710 Acquired absence of both cervix and uterus: Secondary | ICD-10-CM

## 2018-12-18 DIAGNOSIS — D509 Iron deficiency anemia, unspecified: Secondary | ICD-10-CM

## 2018-12-18 DIAGNOSIS — K7469 Other cirrhosis of liver: Secondary | ICD-10-CM

## 2018-12-18 DIAGNOSIS — K31811 Angiodysplasia of stomach and duodenum with bleeding: Secondary | ICD-10-CM

## 2018-12-18 DIAGNOSIS — K5730 Diverticulosis of large intestine without perforation or abscess without bleeding: Secondary | ICD-10-CM

## 2018-12-18 DIAGNOSIS — Z79899 Other long term (current) drug therapy: Secondary | ICD-10-CM

## 2018-12-18 DIAGNOSIS — Z538 Procedure and treatment not carried out for other reasons: Secondary | ICD-10-CM

## 2018-12-18 DIAGNOSIS — Z9049 Acquired absence of other specified parts of digestive tract: Secondary | ICD-10-CM

## 2018-12-18 DIAGNOSIS — I8510 Secondary esophageal varices without bleeding: Secondary | ICD-10-CM

## 2018-12-18 DIAGNOSIS — K7460 Unspecified cirrhosis of liver: Secondary | ICD-10-CM

## 2018-12-18 DIAGNOSIS — K219 Gastro-esophageal reflux disease without esophagitis: Secondary | ICD-10-CM

## 2018-12-18 DIAGNOSIS — G8929 Other chronic pain: Secondary | ICD-10-CM

## 2018-12-18 DIAGNOSIS — K7581 Nonalcoholic steatohepatitis (NASH): Secondary | ICD-10-CM

## 2018-12-18 DIAGNOSIS — R109 Unspecified abdominal pain: Secondary | ICD-10-CM

## 2018-12-18 DIAGNOSIS — R188 Other ascites: Secondary | ICD-10-CM

## 2018-12-18 DIAGNOSIS — I10 Essential (primary) hypertension: Secondary | ICD-10-CM

## 2018-12-18 LAB — LIVER FUNCTION PANEL
Lab: 1.1 mg/dL — ABNORMAL HIGH (ref <0.4)
Lab: 129 U/L — ABNORMAL HIGH (ref 25–110)
Lab: 16 U/L (ref 7–56)
Lab: 2.6 mg/dL — ABNORMAL HIGH (ref 0.3–1.2)
Lab: 3.1 g/dL — ABNORMAL LOW (ref 3.5–5.0)
Lab: 48 U/L — ABNORMAL HIGH (ref 7–40)
Lab: 5.9 g/dL — ABNORMAL LOW (ref 6.0–8.0)

## 2018-12-18 LAB — VITAMIN E: Lab: 7.8 — ABNORMAL HIGH (ref 2–4)

## 2018-12-18 LAB — POC GLUCOSE
Lab: 143 mg/dL — ABNORMAL HIGH (ref 70–100)
Lab: 136 mg/dL — ABNORMAL HIGH (ref >60)

## 2018-12-18 LAB — CBC
Lab: 120 K/UL — ABNORMAL LOW (ref >60)
Lab: 5.8 K/UL — ABNORMAL LOW (ref 4.5–11.0)

## 2018-12-18 MED ORDER — FERROUS SULFATE 325 MG (65 MG IRON) PO TAB
325 mg | ORAL_TABLET | Freq: Every day | ORAL | 1 refills | Status: AC
Start: 2018-12-18 — End: 2018-12-22

## 2018-12-18 MED ORDER — POTASSIUM CHLORIDE 20 MEQ PO TBTQ
20 meq | ORAL_TABLET | Freq: Every day | ORAL | 0 refills | 30.00000 days | Status: AC
Start: 2018-12-18 — End: 2019-07-03

## 2018-12-18 MED ORDER — CIPROFLOXACIN HCL 500 MG PO TAB
500 mg | ORAL_TABLET | Freq: Two times a day (BID) | ORAL | 0 refills | 10.00000 days | Status: AC
Start: 2018-12-18 — End: 2018-12-22

## 2018-12-18 MED ORDER — FERROUS SULFATE 325 MG (65 MG IRON) PO TAB
325 mg | Freq: Every day | ORAL | 0 refills | Status: DC
Start: 2018-12-18 — End: 2018-12-18
  Administered 2018-12-18: 15:00:00 325 mg via ORAL

## 2018-12-18 MED ORDER — POTASSIUM CHLORIDE 20 MEQ PO TBTQ
40 meq | Freq: Once | ORAL | 0 refills | Status: CP
Start: 2018-12-18 — End: ?
  Administered 2018-12-18: 15:00:00 40 meq via ORAL

## 2018-12-18 MED ORDER — SPIRONOLACTONE 50 MG PO TAB
50 mg | ORAL_TABLET | Freq: Every day | ORAL | 0 refills | 46.00000 days | Status: AC
Start: 2018-12-18 — End: 2019-07-03

## 2018-12-19 LAB — VITAMIN A: Lab: 26 — ABNORMAL LOW

## 2018-12-21 LAB — MISC ARUP TEST: Lab: 201

## 2018-12-22 ENCOUNTER — Encounter: Admit: 2018-12-22 | Discharge: 2018-12-22 | Payer: MEDICARE

## 2018-12-22 DIAGNOSIS — E669 Obesity, unspecified: ICD-10-CM

## 2018-12-22 DIAGNOSIS — R748 Abnormal levels of other serum enzymes: ICD-10-CM

## 2018-12-22 DIAGNOSIS — R0600 Dyspnea, unspecified: ICD-10-CM

## 2018-12-22 DIAGNOSIS — R945 Abnormal results of liver function studies: Principal | ICD-10-CM

## 2018-12-22 DIAGNOSIS — I10 Essential (primary) hypertension: Secondary | ICD-10-CM

## 2018-12-22 DIAGNOSIS — 1 ERRONEOUS ENCOUNTER--DISREGARD: ICD-10-CM

## 2018-12-22 DIAGNOSIS — K5730 Diverticulosis of large intestine without perforation or abscess without bleeding: ICD-10-CM

## 2018-12-22 DIAGNOSIS — R109 Unspecified abdominal pain: ICD-10-CM

## 2018-12-22 DIAGNOSIS — J329 Chronic sinusitis, unspecified: ICD-10-CM

## 2018-12-22 DIAGNOSIS — K31819 Angiodysplasia of stomach and duodenum without bleeding: Secondary | ICD-10-CM

## 2018-12-22 DIAGNOSIS — E119 Type 2 diabetes mellitus without complications: ICD-10-CM

## 2018-12-22 DIAGNOSIS — K760 Fatty (change of) liver, not elsewhere classified: ICD-10-CM

## 2018-12-22 MED ORDER — LACTULOSE 10 GRAM/15 ML PO SOLN
6.67 g | Freq: Three times a day (TID) | ORAL | 3 refills | 21.00000 days | Status: AC
Start: 2018-12-22 — End: 2019-01-07

## 2018-12-22 MED ORDER — CIPROFLOXACIN HCL 500 MG PO TAB
500 mg | ORAL_TABLET | Freq: Every day | ORAL | 3 refills | 10.00000 days | Status: AC
Start: 2018-12-22 — End: 2019-04-01

## 2018-12-22 MED ORDER — FERROUS SULFATE 325 MG (65 MG IRON) PO TAB
325 mg | ORAL_TABLET | Freq: Two times a day (BID) | ORAL | 1 refills | Status: AC
Start: 2018-12-22 — End: 2019-08-21

## 2018-12-22 MED ORDER — RIFAXIMIN 550 MG PO TAB
550 mg | ORAL_TABLET | Freq: Two times a day (BID) | ORAL | 11 refills | 30.00000 days | Status: AC
Start: 2018-12-22 — End: 2019-01-07

## 2018-12-22 MED ORDER — VITAMIN A 10,000 UNIT PO CAP
10000 [IU] | ORAL_CAPSULE | Freq: Every day | ORAL | 1 refills | Status: AC
Start: 2018-12-22 — End: 2019-06-29

## 2018-12-23 ENCOUNTER — Encounter: Admit: 2018-12-23 | Discharge: 2018-12-23 | Payer: MEDICARE

## 2018-12-23 ENCOUNTER — Ambulatory Visit: Admit: 2018-12-22 | Discharge: 2018-12-23 | Payer: MEDICARE

## 2018-12-23 ENCOUNTER — Ambulatory Visit: Admit: 2018-12-22 | Discharge: 2018-12-22 | Payer: MEDICARE

## 2018-12-23 DIAGNOSIS — R945 Abnormal results of liver function studies: Principal | ICD-10-CM

## 2018-12-23 DIAGNOSIS — K760 Fatty (change of) liver, not elsewhere classified: ICD-10-CM

## 2018-12-23 DIAGNOSIS — K31819 Angiodysplasia of stomach and duodenum without bleeding: ICD-10-CM

## 2018-12-23 DIAGNOSIS — Z01818 Encounter for other preprocedural examination: ICD-10-CM

## 2018-12-23 DIAGNOSIS — Z8719 Personal history of other diseases of the digestive system: ICD-10-CM

## 2018-12-23 DIAGNOSIS — E1121 Type 2 diabetes mellitus with diabetic nephropathy: Principal | ICD-10-CM

## 2018-12-23 MED ORDER — ATORVASTATIN 10 MG PO TAB
ORAL_TABLET | Freq: Every day | 3 refills | Status: AC
Start: 2018-12-23 — End: 2019-11-18

## 2018-12-23 NOTE — Progress Notes
The Prior Authorization for Xifaxan was submitted for [employer] via [employer].  Will continue to follow.    Juliane Lack  Pharmacy Patient Advocate  [phone]

## 2018-12-24 ENCOUNTER — Encounter: Admit: 2018-12-24 | Discharge: 2018-12-24 | Payer: MEDICARE

## 2018-12-24 MED ORDER — DIPHENHYDRAMINE HCL 50 MG PO CAP
ORAL_CAPSULE | ORAL | 0 refills | 6.00000 days | Status: AC
Start: 2018-12-24 — End: 2019-01-07

## 2018-12-24 MED ORDER — METHYLPREDNISOLONE 32 MG PO TAB
ORAL_TABLET | 0 refills | Status: AC
Start: 2018-12-24 — End: 2019-04-01

## 2018-12-25 ENCOUNTER — Encounter: Admit: 2018-12-25 | Discharge: 2018-12-25 | Payer: MEDICARE

## 2018-12-28 ENCOUNTER — Encounter: Admit: 2018-12-28 | Discharge: 2018-12-28 | Payer: MEDICARE

## 2018-12-28 DIAGNOSIS — E669 Obesity, unspecified: ICD-10-CM

## 2018-12-28 DIAGNOSIS — I10 Essential (primary) hypertension: Secondary | ICD-10-CM

## 2018-12-28 DIAGNOSIS — R109 Unspecified abdominal pain: ICD-10-CM

## 2018-12-28 DIAGNOSIS — K5730 Diverticulosis of large intestine without perforation or abscess without bleeding: ICD-10-CM

## 2018-12-28 DIAGNOSIS — R945 Abnormal results of liver function studies: Principal | ICD-10-CM

## 2018-12-28 DIAGNOSIS — E119 Type 2 diabetes mellitus without complications: ICD-10-CM

## 2018-12-28 DIAGNOSIS — R748 Abnormal levels of other serum enzymes: ICD-10-CM

## 2018-12-28 DIAGNOSIS — J329 Chronic sinusitis, unspecified: ICD-10-CM

## 2018-12-28 DIAGNOSIS — R0600 Dyspnea, unspecified: ICD-10-CM

## 2018-12-28 DIAGNOSIS — K760 Fatty (change of) liver, not elsewhere classified: ICD-10-CM

## 2018-12-30 ENCOUNTER — Encounter: Admit: 2018-12-30 | Discharge: 2018-12-30 | Payer: MEDICARE

## 2018-12-31 ENCOUNTER — Encounter: Admit: 2018-12-31 | Discharge: 2018-12-31 | Payer: MEDICARE

## 2018-12-31 DIAGNOSIS — K760 Fatty (change of) liver, not elsewhere classified: Principal | ICD-10-CM

## 2018-12-31 DIAGNOSIS — E1121 Type 2 diabetes mellitus with diabetic nephropathy: ICD-10-CM

## 2018-12-31 DIAGNOSIS — K31819 Angiodysplasia of stomach and duodenum without bleeding: ICD-10-CM

## 2018-12-31 LAB — CBC AND DIFF
Lab: 0.2
Lab: 0.3
Lab: 0.6
Lab: 1.4 10*3/uL (ref 0.9–5.1)
Lab: 10 U/L — ABNORMAL HIGH (ref 0.0–10.0)
Lab: 20 % — ABNORMAL HIGH (ref 11.5–14.5)
Lab: 22 pg — ABNORMAL LOW (ref >59)
Lab: 238 10*3/uL — ABNORMAL HIGH (ref 130–400)
Lab: 25 U/L (ref 0–55)
Lab: 27 % — ABNORMAL LOW (ref 37.0–47.0)
Lab: 28 % — ABNORMAL LOW (ref 30.0–34.0)
Lab: 3 g/dL — ABNORMAL HIGH (ref 0.0–2.0)
Lab: 3.2
Lab: 3.4 uL — ABNORMAL LOW (ref 4.20–5.40)
Lab: 5.7 10*3/uL — ABNORMAL LOW (ref 4.8–10.8)
Lab: 5.9 g/dL (ref 0.0–6.0)
Lab: 55 U/L — ABNORMAL HIGH (ref 40.0–75.0)
Lab: 7.8 g/dL — ABNORMAL LOW (ref 12.0–16.0)
Lab: 78 — ABNORMAL LOW (ref 80.0–99.0)

## 2018-12-31 LAB — PROTIME INR (PT)
Lab: 1.3
Lab: 14 s — ABNORMAL HIGH (ref 9.9–12.1)

## 2018-12-31 LAB — COMPREHENSIVE METABOLIC PANEL: Lab: 140 mmol/L (ref 136–145)

## 2018-12-31 LAB — LIPID PROFILE: Lab: 64 mmol/L (ref 3.5–5.1)

## 2019-01-05 ENCOUNTER — Encounter: Admit: 2019-01-05 | Discharge: 2019-01-05 | Payer: MEDICARE

## 2019-01-06 ENCOUNTER — Ambulatory Visit: Admit: 2019-01-06 | Discharge: 2019-01-07 | Payer: MEDICARE

## 2019-01-06 ENCOUNTER — Encounter: Admit: 2019-01-06 | Discharge: 2019-01-06 | Payer: MEDICARE

## 2019-01-06 DIAGNOSIS — E669 Obesity, unspecified: Secondary | ICD-10-CM

## 2019-01-06 DIAGNOSIS — E119 Type 2 diabetes mellitus without complications: ICD-10-CM

## 2019-01-06 DIAGNOSIS — R748 Abnormal levels of other serum enzymes: ICD-10-CM

## 2019-01-06 DIAGNOSIS — R109 Unspecified abdominal pain: ICD-10-CM

## 2019-01-06 DIAGNOSIS — K5730 Diverticulosis of large intestine without perforation or abscess without bleeding: ICD-10-CM

## 2019-01-06 DIAGNOSIS — J329 Chronic sinusitis, unspecified: ICD-10-CM

## 2019-01-06 DIAGNOSIS — Z01818 Encounter for other preprocedural examination: ICD-10-CM

## 2019-01-06 DIAGNOSIS — K760 Fatty (change of) liver, not elsewhere classified: ICD-10-CM

## 2019-01-06 DIAGNOSIS — R945 Abnormal results of liver function studies: Principal | ICD-10-CM

## 2019-01-06 DIAGNOSIS — R0600 Dyspnea, unspecified: ICD-10-CM

## 2019-01-06 DIAGNOSIS — I10 Essential (primary) hypertension: ICD-10-CM

## 2019-01-06 NOTE — Patient Instructions
Good luck

## 2019-01-07 ENCOUNTER — Encounter: Admit: 2019-01-07 | Discharge: 2019-01-07 | Payer: MEDICARE

## 2019-01-07 ENCOUNTER — Ambulatory Visit: Admit: 2019-01-07 | Discharge: 2019-01-07 | Payer: MEDICARE

## 2019-01-07 ENCOUNTER — Ambulatory Visit: Admit: 2019-01-07 | Discharge: 2019-01-08 | Payer: MEDICARE

## 2019-01-07 DIAGNOSIS — Z01810 Encounter for preprocedural cardiovascular examination: ICD-10-CM

## 2019-01-07 DIAGNOSIS — K31819 Angiodysplasia of stomach and duodenum without bleeding: ICD-10-CM

## 2019-01-07 DIAGNOSIS — D500 Iron deficiency anemia secondary to blood loss (chronic): ICD-10-CM

## 2019-01-07 DIAGNOSIS — R109 Unspecified abdominal pain: ICD-10-CM

## 2019-01-07 DIAGNOSIS — K760 Fatty (change of) liver, not elsewhere classified: Principal | ICD-10-CM

## 2019-01-07 DIAGNOSIS — I10 Essential (primary) hypertension: ICD-10-CM

## 2019-01-07 DIAGNOSIS — R748 Abnormal levels of other serum enzymes: ICD-10-CM

## 2019-01-07 DIAGNOSIS — Z01818 Encounter for other preprocedural examination: ICD-10-CM

## 2019-01-07 DIAGNOSIS — R0600 Dyspnea, unspecified: ICD-10-CM

## 2019-01-07 DIAGNOSIS — E119 Type 2 diabetes mellitus without complications: ICD-10-CM

## 2019-01-07 DIAGNOSIS — R945 Abnormal results of liver function studies: Principal | ICD-10-CM

## 2019-01-07 DIAGNOSIS — E669 Obesity, unspecified: Secondary | ICD-10-CM

## 2019-01-07 DIAGNOSIS — J329 Chronic sinusitis, unspecified: ICD-10-CM

## 2019-01-07 DIAGNOSIS — K5730 Diverticulosis of large intestine without perforation or abscess without bleeding: ICD-10-CM

## 2019-01-07 LAB — CBC AND DIFF
Lab: 23 % — ABNORMAL LOW (ref 36–45)
Lab: 3.2 M/UL — ABNORMAL LOW (ref 4.0–5.0)
Lab: 5.2 K/UL — ABNORMAL HIGH (ref 4.5–11.0)
Lab: 7.2 g/dL — ABNORMAL LOW (ref 12.0–15.0)
Lab: 71 FL — ABNORMAL LOW (ref 80–100)

## 2019-01-07 LAB — COMPREHENSIVE METABOLIC PANEL
Lab: 134 MMOL/L — ABNORMAL LOW (ref 137–147)
Lab: 142 mg/dL — ABNORMAL HIGH (ref >60)
Lab: 4.4 MMOL/L (ref 3.5–5.1)
Lab: 94 MMOL/L — ABNORMAL LOW (ref >60)

## 2019-01-07 LAB — PROTIME INR (PT): Lab: 1.3 U/L — ABNORMAL HIGH (ref 0.8–1.2)

## 2019-01-07 MED ORDER — REGADENOSON 0.4 MG/5 ML IV SYRG
.4 mg | Freq: Once | INTRAVENOUS | 0 refills | Status: CP
Start: 2019-01-07 — End: ?
  Administered 2019-01-07: 16:00:00 0.4 mg via INTRAVENOUS

## 2019-01-07 MED ORDER — NITROGLYCERIN 0.4 MG SL SUBL
.4 mg | SUBLINGUAL | 0 refills | Status: DC | PRN
Start: 2019-01-07 — End: 2019-01-12

## 2019-01-07 MED ORDER — RIFAXIMIN 550 MG PO TAB
550 mg | ORAL_TABLET | Freq: Two times a day (BID) | ORAL | 11 refills | 30.00000 days | Status: AC
Start: 2019-01-07 — End: 2019-01-20

## 2019-01-07 MED ORDER — IOHEXOL 350 MG IODINE/ML IV SOLN
100 mL | Freq: Once | INTRAVENOUS | 0 refills | Status: CP
Start: 2019-01-07 — End: ?
  Administered 2019-01-07: 18:00:00 100 mL via INTRAVENOUS

## 2019-01-07 MED ORDER — BENZOCAINE-MENTHOL 6-10 MG MM LOZG
1 | Freq: Once | ORAL | 0 refills | Status: DC | PRN
Start: 2019-01-07 — End: 2019-01-12

## 2019-01-07 MED ORDER — LACTULOSE 10 GRAM/15 ML PO SOLN
6.67 g | Freq: Three times a day (TID) | ORAL | 3 refills | 21.00000 days | Status: AC
Start: 2019-01-07 — End: 2019-01-31

## 2019-01-07 MED ORDER — PERFLUTREN LIPID MICROSPHERES 1.1 MG/ML IV SUSP
1-20 mL | Freq: Once | INTRAVENOUS | 0 refills | Status: CP | PRN
Start: 2019-01-07 — End: ?
  Administered 2019-01-07: 14:00:00 2 mL via INTRAVENOUS

## 2019-01-07 MED ORDER — SODIUM CHLORIDE 0.9 % IJ SOLN
50 mL | Freq: Once | INTRAVENOUS | 0 refills | Status: CP
Start: 2019-01-07 — End: ?
  Administered 2019-01-07: 18:00:00 50 mL via INTRAVENOUS

## 2019-01-07 MED ORDER — AMINOPHYLLINE 500 MG/20 ML IV SOLN
50 mg | INTRAVENOUS | 0 refills | Status: DC | PRN
Start: 2019-01-07 — End: 2019-01-12

## 2019-01-07 MED ORDER — SODIUM CHLORIDE 0.9 % IV SOLP
250 mL | INTRAVENOUS | 0 refills | Status: DC | PRN
Start: 2019-01-07 — End: 2019-01-12

## 2019-01-07 MED ORDER — ALBUTEROL SULFATE 90 MCG/ACTUATION IN HFAA
2 | RESPIRATORY_TRACT | 0 refills | Status: DC | PRN
Start: 2019-01-07 — End: 2019-01-12

## 2019-01-08 DIAGNOSIS — Z01810 Encounter for preprocedural cardiovascular examination: ICD-10-CM

## 2019-01-08 DIAGNOSIS — D500 Iron deficiency anemia secondary to blood loss (chronic): ICD-10-CM

## 2019-01-08 DIAGNOSIS — K760 Fatty (change of) liver, not elsewhere classified: ICD-10-CM

## 2019-01-08 DIAGNOSIS — K7460 Unspecified cirrhosis of liver: Principal | ICD-10-CM

## 2019-01-08 NOTE — Progress Notes
ATTENDING NOTE      Encounter Date: 01/06/2019    I saw and evaluated Kristin Moody, discussed with Matthew Folks, MD and concur with the assessment and treatment plan.     PRESENTING PROBLEM AND BACKGROUND: Patient is 63 y.o. female with no past psychiatric history who presents to clinic for evaluation for liver transplantation.    CURRENT TREATMENT AND RESPONSES:   Denies current or prior mental health or substance use.    PLAN:  1. Kristin Moody is an excellent candidate for transplantation.    2. Discussed concerns about liver function with Cymbalta use and encouraged her to speak with her PCP about either lowering the dose or looking for alternative treatment for pain management.

## 2019-01-09 DIAGNOSIS — K31819 Angiodysplasia of stomach and duodenum without bleeding: ICD-10-CM

## 2019-01-09 NOTE — Progress Notes
Confirmed procedure date/time with patient, reviewed prep instructions with patient, verbalized good understanding, confirmed patient would have a driver and mailed instructions to patient.      EGD (ESOPHAGOGASTRODUODENOSCOPY) PREP      Upper GI endoscopy allows healthcare providers to look directly into the beginning of your gastrointestinal(GI) tract.  The esophagus, stomach, and duodenum (first part of the small intestine) make up the upper GI tract.      5 Days Prior:  1. Check with your prescribing physician for instructions about stopping your blood thinner.  Examples of blood thinners are Aleve, Aspirin. Coumadin, Eliquis, Ibuprofen, Naproxen, Plavix, and Xarelto.  2. Do not give yourself a Lovenox injection the morning of the test. Lovenox injections may be taken as usual through the day before your test.    Day of Exam:  1. Do not eat or drink anything after midnight the night before your exam. However, if your exam is in the afternoon you may drink clear liquids only up until ( 8) hours before your scheduled procedure time.  After this, you should have nothing by mouth.  This includes GUM or CANDY.   a. Chewing tobacco must be stopped  (6) hours before your scheduled procedure.   b. If you have an early morning test, take ONLY your essential morning medications (heart, blood pressure, seizure, etc.) with a small sip of water.   c. You will be sedated for the procedure. A responsible adult must drive you home (no Benedetto Goad, taxis, or buses are permitted). If you do not have a driver we will be unable to do the test.   d. You will be here for (3-4) hours from arrival time.   e. You will not be able to return to work the same day.   f. Please bring a list of your current medications and the dosages with you.     The Procedure:  ? You will lie on the endoscopy table. Usually patients lie on the left side.  ? You will be monitored and given oxygen.

## 2019-01-09 NOTE — Progress Notes
Liver Transplant Nutrition Evaluation    Pt meets guidelines for severe malnutrition/impaired functional status but is working to improve nutritional/functional status. Pending pt is able to maintain/improve nutritional status, I see no nutritional contraindications to transplantation.    BMI: 25.0  Hand grip strength: below average (pt 9-10lb, average ~30-60lb)  Malnutrition Context: ICD-10 code E43: Chronic illness/Severe malnutrition  Malnutrition Assessment: Malnutrition present  Grip Strength (R) lbs: 9 lb(s)(Right handed)  Grip Strength (L) lbs: 10 lb(s)  SARC-F Questionnaire: 3(Some difficulty lifting, very difficult for 10 stairs)    Assessment:  Estimated body mass index is 24.59 kg/m??? as calculated from the following:    Height as of 01/07/19: 160 cm (63).    Weight as of 01/07/19: 63 kg (138 lb 12.8 oz).      Wt Readings from Last 10 Encounters:   01/07/19 63 kg (138 lb 12.8 oz)   01/07/19 64 kg (141 lb)   01/06/19 64 kg (141 lb)   12/22/18 69.1 kg (152 lb 6.4 oz)   12/22/18 69.1 kg (152 lb 6.4 oz)   12/16/18 69.9 kg (154 lb)   12/15/18 70.2 kg (154 lb 12.8 oz)   10/26/18 82 kg (180 lb 12.4 oz)   10/14/18 79.7 kg (175 lb 12.8 oz)   08/29/18 82.8 kg (182 lb 9.6 oz)       Nutrition related laboratory values:  Last vitamin D:   Lab Results   Component Value Date    VITD25 84.0 (H) 12/15/2018     Last Vitamin A: No results found for: FREERETINOL  Last Zinc:   Zinc   Date Value Ref Range Status   12/15/2018 0.64 (L)  Final     Comment:     Reference range: 0.66 to 1.10  Unit: mcg/mL    -------------------ADDITIONAL INFORMATION-------------------  This test was developed and its performance characteristics   determined by [facility] in a manner consistent with CLIA   requirements. This test has not been cleared or approved by   the U.S. Food and Drug Administration.  [facility], [address], [place] [address]       Last HbA1c: 5.3  FSBS at home: 98-119 mg/dl, checks every 2-3 days

## 2019-01-12 ENCOUNTER — Encounter: Admit: 2019-01-12 | Discharge: 2019-01-12 | Payer: MEDICARE

## 2019-01-19 ENCOUNTER — Encounter: Admit: 2019-01-19 | Discharge: 2019-01-19 | Payer: MEDICARE

## 2019-01-19 DIAGNOSIS — D500 Iron deficiency anemia secondary to blood loss (chronic): Principal | ICD-10-CM

## 2019-01-19 MED ORDER — ACETAMINOPHEN 500 MG PO TAB
500 mg | Freq: Once | ORAL | 0 refills | Status: CN
Start: 2019-01-19 — End: ?

## 2019-01-19 MED ORDER — DIPHENHYDRAMINE HCL 25 MG PO CAP
25 mg | Freq: Once | ORAL | 0 refills | Status: CN
Start: 2019-01-19 — End: ?

## 2019-01-19 MED ORDER — IRON DEXTRAN IVPB
975 mg | Freq: Once | INTRAVENOUS | 0 refills | Status: CN
Start: 2019-01-19 — End: ?

## 2019-01-19 MED ORDER — IRON DEXTRAN IVPB
25 mg | Freq: Once | INTRAVENOUS | 0 refills | Status: CN
Start: 2019-01-19 — End: ?

## 2019-01-20 ENCOUNTER — Encounter: Admit: 2019-01-20 | Discharge: 2019-01-20 | Payer: MEDICARE

## 2019-01-20 MED ORDER — RIFAXIMIN 550 MG PO TAB
550 mg | ORAL_TABLET | Freq: Two times a day (BID) | ORAL | 11 refills | Status: SS
Start: 2019-01-20 — End: 2019-06-19

## 2019-01-20 NOTE — Telephone Encounter
Please return a call to [facility] [place] with [facility] regarding Navaya's application.

## 2019-01-21 ENCOUNTER — Ambulatory Visit: Admit: 2019-01-21 | Discharge: 2019-01-21 | Payer: MEDICARE

## 2019-01-21 ENCOUNTER — Encounter: Admit: 2019-01-21 | Discharge: 2019-01-21 | Payer: MEDICARE

## 2019-01-21 DIAGNOSIS — K760 Fatty (change of) liver, not elsewhere classified: ICD-10-CM

## 2019-01-21 DIAGNOSIS — K5730 Diverticulosis of large intestine without perforation or abscess without bleeding: ICD-10-CM

## 2019-01-21 DIAGNOSIS — D509 Iron deficiency anemia, unspecified: Secondary | ICD-10-CM

## 2019-01-21 DIAGNOSIS — R0600 Dyspnea, unspecified: ICD-10-CM

## 2019-01-21 DIAGNOSIS — R109 Unspecified abdominal pain: ICD-10-CM

## 2019-01-21 DIAGNOSIS — K7460 Unspecified cirrhosis of liver: Principal | ICD-10-CM

## 2019-01-21 DIAGNOSIS — I10 Essential (primary) hypertension: ICD-10-CM

## 2019-01-21 DIAGNOSIS — E669 Obesity, unspecified: Secondary | ICD-10-CM

## 2019-01-21 DIAGNOSIS — R748 Abnormal levels of other serum enzymes: ICD-10-CM

## 2019-01-21 DIAGNOSIS — I671 Cerebral aneurysm, nonruptured: Principal | ICD-10-CM

## 2019-01-21 DIAGNOSIS — R945 Abnormal results of liver function studies: Principal | ICD-10-CM

## 2019-01-21 DIAGNOSIS — J329 Chronic sinusitis, unspecified: ICD-10-CM

## 2019-01-21 DIAGNOSIS — Z01810 Encounter for preprocedural cardiovascular examination: ICD-10-CM

## 2019-01-21 DIAGNOSIS — E119 Type 2 diabetes mellitus without complications: ICD-10-CM

## 2019-01-21 NOTE — Committee Review
Committee Review Note     Evaluation Date: 12/15/2018  Committee Review Date: 01/21/2019    Organ being evaluated for: Liver    Transplant Phase: Evaluation  Transplant Status:  Active    Transplant Coordinator: Beverely Risen  Transplant Hepatologist:      Referring Physician:     Primary Diagnosis:   Secondary Diagnosis:     Committee Review Members:  Anesthesiology Currie Paris, MD   Dietician, Registered Margarita Grizzle, RD   Finance Kim Ashlock   Pharmacist Nonnie Done, [facility]   Transplant Hepatology Maryan Char, MD, Lauree Chandler, RN, Cardell Peach, MD, Merlyn Albert, RN, Rodena Piety, MD, Dawna Part, MD, Franco Collet, RN, Boris Lown, RN, Beverely Risen, RN, Dorothey Baseman, MD   Transplant Surgery York Cerise, MD, Jimmy Footman, MD       Transplant Eligibility: Adults age 1 or older.  A minor may be considered on an individual basis, End Stage Liver Disease as defined by signs of decompensated liver disease or other malignancies as per UNOS approved indications as clinically indicated, Acceptable surgical/medical risks as determined by the transplant team, Emotional and socio-psychological stability and demonstrated adherence to the recommended medical regimen, Financial resources for transplant and post-transplant management    Committee Review Decision: Deferred    Relative Contraindications:     Absolute Contraindications:     Committee Discussion Details: Patient to be referred to vascular surgery for opinion on 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 Return to committee for discussion prior to listing.

## 2019-01-22 ENCOUNTER — Encounter: Admit: 2019-01-22 | Discharge: 2019-01-22 | Payer: MEDICARE

## 2019-01-22 ENCOUNTER — Ambulatory Visit: Admit: 2019-01-22 | Discharge: 2019-01-22 | Payer: MEDICARE

## 2019-01-22 DIAGNOSIS — K766 Portal hypertension: ICD-10-CM

## 2019-01-22 DIAGNOSIS — K2210 Ulcer of esophagus without bleeding: Principal | ICD-10-CM

## 2019-01-22 DIAGNOSIS — D509 Iron deficiency anemia, unspecified: ICD-10-CM

## 2019-01-22 DIAGNOSIS — K3189 Other diseases of stomach and duodenum: ICD-10-CM

## 2019-01-22 DIAGNOSIS — R634 Abnormal weight loss: ICD-10-CM

## 2019-01-22 DIAGNOSIS — R1310 Dysphagia, unspecified: ICD-10-CM

## 2019-01-22 DIAGNOSIS — E119 Type 2 diabetes mellitus without complications: ICD-10-CM

## 2019-01-22 DIAGNOSIS — J329 Chronic sinusitis, unspecified: ICD-10-CM

## 2019-01-22 DIAGNOSIS — I10 Essential (primary) hypertension: ICD-10-CM

## 2019-01-22 DIAGNOSIS — K7460 Unspecified cirrhosis of liver: ICD-10-CM

## 2019-01-22 DIAGNOSIS — K5730 Diverticulosis of large intestine without perforation or abscess without bleeding: ICD-10-CM

## 2019-01-22 DIAGNOSIS — K760 Fatty (change of) liver, not elsewhere classified: Principal | ICD-10-CM

## 2019-01-22 DIAGNOSIS — R0600 Dyspnea, unspecified: ICD-10-CM

## 2019-01-22 DIAGNOSIS — Z01810 Encounter for preprocedural cardiovascular examination: ICD-10-CM

## 2019-01-22 DIAGNOSIS — E669 Obesity, unspecified: ICD-10-CM

## 2019-01-22 LAB — CBC AND DIFF
Lab: 0.2 MMOL/L (ref 21–30)
Lab: 0.6 U/L (ref 7–40)
Lab: 1.3 U/L — ABNORMAL LOW (ref 25–110)
Lab: 10 mg/dL — ABNORMAL HIGH (ref 0.0–10.0)
Lab: 18 MMOL/L — ABNORMAL HIGH (ref >60)
Lab: 2.7 g/dL (ref 6.0–8.0)
Lab: 20 MMOL/L — ABNORMAL LOW (ref 27.0–31.0)
Lab: 21 mg/dL (ref 0.4–1.00)
Lab: 216 mg/dL — ABNORMAL HIGH (ref 70–100)
Lab: 25 MMOL/L — ABNORMAL LOW (ref 37.0–47.0)
Lab: 28 MMOL/L — ABNORMAL LOW (ref >60)
Lab: 3.5 U/L — ABNORMAL LOW (ref 4.20–5.40)
Lab: 3.6 g/dL (ref 3.5–5.0)
Lab: 5.7 g/dL — ABNORMAL LOW (ref 3.5–5.0)
Lab: 62 mg/dL — ABNORMAL HIGH (ref 7–25)
Lab: 7.3 U/L — ABNORMAL LOW (ref 12.0–16.0)
Lab: 71 10*3/uL — ABNORMAL LOW (ref 80.0–99.0)

## 2019-01-22 LAB — POC GLUCOSE: Lab: 92 mg/dL (ref 70–100)

## 2019-01-22 MED ORDER — METOCLOPRAMIDE HCL 5 MG/ML IJ SOLN
10 mg | Freq: Once | INTRAVENOUS | 0 refills | Status: CN | PRN
Start: 2019-01-22 — End: ?

## 2019-01-22 MED ORDER — LIDOCAINE (PF) 200 MG/10 ML (2 %) IJ SYRG
0 refills | Status: DC
Start: 2019-01-22 — End: 2019-01-22
  Administered 2019-01-22: 16:00:00 60 mg via INTRAVENOUS

## 2019-01-22 MED ORDER — LACTATED RINGERS IV SOLP
1000 mL | Freq: Once | INTRAVENOUS | 0 refills | Status: DC
Start: 2019-01-22 — End: 2019-01-22

## 2019-01-22 MED ORDER — PROPOFOL INJ 10 MG/ML IV VIAL
0 refills | Status: DC
Start: 2019-01-22 — End: 2019-01-22
  Administered 2019-01-22 (×2): 50 mg via INTRAVENOUS
  Administered 2019-01-22: 16:00:00 20 mg via INTRAVENOUS
  Administered 2019-01-22: 16:00:00 10 mg via INTRAVENOUS
  Administered 2019-01-22: 16:00:00 50 mg via INTRAVENOUS
  Administered 2019-01-22: 16:00:00 20 mg via INTRAVENOUS

## 2019-01-22 MED ORDER — PROMETHAZINE 25 MG/ML IJ SOLN
6.25 mg | INTRAVENOUS | 0 refills | Status: CN | PRN
Start: 2019-01-22 — End: ?

## 2019-01-22 MED ORDER — FENTANYL CITRATE (PF) 50 MCG/ML IJ SOLN
25 ug | INTRAVENOUS | 0 refills | Status: CN | PRN
Start: 2019-01-22 — End: ?

## 2019-01-22 MED ORDER — LACTATED RINGERS IV SOLP
0 refills | Status: DC
Start: 2019-01-22 — End: 2019-01-22
  Administered 2019-01-22: 16:00:00 via INTRAVENOUS

## 2019-01-22 MED ORDER — PROPOFOL 10 MG/ML IV EMUL 20 ML (INFUSION)(AM)(OR)
INTRAVENOUS | 0 refills | Status: DC
Start: 2019-01-22 — End: 2019-01-22
  Administered 2019-01-22: 16:00:00 100 ug/kg/min via INTRAVENOUS

## 2019-01-22 NOTE — Anesthesia Pre-Procedure Evaluation
Right Ventricle Normal size, wall thickness, ejection fraction and septal motion.  Left Atrium Normal size.  Right Atrium Normal size.  IVC/SVC Markedly elevated central venous pressure (>15 mm Hg).  Mitral Valve Normal valve structure. No stenosis. Mild regurgitation.  Tricuspid Valve Mild to moderate regurgitation.  Aortic Valve Normal valve structure. No stenosis. No regurgitation.  Pulmonary No regurgitation.  Aorta The aortic root and ascending aorta appear normal in size.  Pericardium Pericardial fat pad present. No pericardial effusion. Ascites present.          Exercise tolerance: <4 METS      Beta Blocker therapy: No      Beta blockers within 24 hours: No        Hypertension, well controlled      Dyspnea on exertion      GI/Hepatic/Renal         GERD,       Liver disease      Cirrhosis      Esophageal varices (patient reports history of banding)      Nausea      No vomiting      Per H&P  has chronic oozing from very large antral nodules measuring 2 x 2.5 cm with superficial ulceration???from PHG. These been biopsied and not consistent with GAVE. Enteroscopy on 08/19/18:???Erythematous mucosa in the antrum with GAVE like picture (prior biopsies not consistent with GAVE).???Three gastric polyps versus portal hypertension induced mucosal changes.???Normal examined duodenum.   - 08/2018 US abdomen w/ doppler:???Reversal of flow in the splenic vein at the midline. Otherwise patent hepatic vasculature with normal direction of flow. 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 3. ???Increased systemic venous pulsatility which may represent fluid overload/increased right atrial pressure.  - Hemoglobin below 7 on admission to ED today - transfused 2 units pRBC's.  Repeat Hgb ordered.      Neuro/Psych       No seizures      No CVA      Chronic opioid use        Psychiatric history          Anxiety        Endocrine/Other       Diabetes, well controlled, type 2      Anemia

## 2019-01-23 ENCOUNTER — Encounter: Admit: 2019-01-23 | Discharge: 2019-01-23 | Payer: MEDICARE

## 2019-01-23 DIAGNOSIS — Z01810 Encounter for preprocedural cardiovascular examination: ICD-10-CM

## 2019-01-23 DIAGNOSIS — I10 Essential (primary) hypertension: Secondary | ICD-10-CM

## 2019-01-23 DIAGNOSIS — J329 Chronic sinusitis, unspecified: ICD-10-CM

## 2019-01-23 DIAGNOSIS — K760 Fatty (change of) liver, not elsewhere classified: Principal | ICD-10-CM

## 2019-01-23 DIAGNOSIS — K7460 Unspecified cirrhosis of liver: ICD-10-CM

## 2019-01-23 DIAGNOSIS — R0600 Dyspnea, unspecified: ICD-10-CM

## 2019-01-23 DIAGNOSIS — E669 Obesity, unspecified: ICD-10-CM

## 2019-01-23 DIAGNOSIS — K5730 Diverticulosis of large intestine without perforation or abscess without bleeding: ICD-10-CM

## 2019-01-23 DIAGNOSIS — E119 Type 2 diabetes mellitus without complications: ICD-10-CM

## 2019-01-27 ENCOUNTER — Encounter: Admit: 2019-01-27 | Discharge: 2019-01-27 | Payer: MEDICARE

## 2019-01-29 ENCOUNTER — Encounter: Admit: 2019-01-29 | Discharge: 2019-01-29 | Payer: MEDICARE

## 2019-01-29 DIAGNOSIS — K31819 Angiodysplasia of stomach and duodenum without bleeding: Principal | ICD-10-CM

## 2019-01-29 DIAGNOSIS — D500 Iron deficiency anemia secondary to blood loss (chronic): ICD-10-CM

## 2019-01-29 DIAGNOSIS — K7460 Unspecified cirrhosis of liver: ICD-10-CM

## 2019-01-30 ENCOUNTER — Encounter: Admit: 2019-01-30 | Discharge: 2019-01-30 | Payer: MEDICARE

## 2019-01-30 DIAGNOSIS — D649 Anemia, unspecified: ICD-10-CM

## 2019-01-30 DIAGNOSIS — R5383 Other fatigue: ICD-10-CM

## 2019-01-30 LAB — COMPREHENSIVE METABOLIC PANEL
Lab: 108 U/L — ABNORMAL LOW (ref 25–110)
Lab: 138 MMOL/L — ABNORMAL LOW (ref 137–147)
Lab: 21 10*3/uL — ABNORMAL HIGH (ref 3–12)
Lab: 27 mg/dL — ABNORMAL HIGH (ref 7–25)
Lab: 38 mL/min — ABNORMAL LOW (ref >60)
Lab: 4 g/dL — ABNORMAL HIGH (ref 3.5–5.0)
Lab: 4.6 MMOL/L — ABNORMAL LOW (ref 3.5–5.1)
Lab: 46 mL/min — ABNORMAL LOW (ref >60)

## 2019-01-30 LAB — CBC AND DIFF
Lab: 0 10*3/uL (ref 0–0.20)
Lab: 0 10*3/uL (ref 0–0.45)
Lab: 7.4 10*3/uL (ref 4.5–11.0)

## 2019-01-30 LAB — POC POTASSIUM: Lab: 4.5 MMOL/L — ABNORMAL LOW (ref 3.5–5.1)

## 2019-01-30 LAB — POC TROPONIN: Lab: 0 ng/mL (ref 0.00–0.05)

## 2019-01-30 LAB — POC HEMATOCRIT
Lab: 24 % — ABNORMAL LOW (ref 36–45)
Lab: 8.2 g/dL — ABNORMAL LOW (ref 12.0–15.0)

## 2019-01-30 LAB — POC IONIZED CALCIUM: Lab: 1.2 MMOL/L (ref 1.0–1.3)

## 2019-01-30 LAB — POC SODIUM: Lab: 137 MMOL/L (ref 137–147)

## 2019-01-30 LAB — POC BLOOD GAS VEN: Lab: 18 MMOL/L

## 2019-01-30 MED ORDER — LACTULOSE 10 GRAM/15 ML PO SOLN
6.67 g | Freq: Three times a day (TID) | ORAL | 0 refills | Status: DC
Start: 2019-01-30 — End: 2019-01-31
  Administered 2019-01-31: 14:00:00 6.67 g via ORAL

## 2019-01-30 MED ORDER — POLYETHYLENE GLYCOL 3350 17 GRAM PO PWPK
17 g | Freq: Two times a day (BID) | ORAL | 0 refills | Status: DC
Start: 2019-01-30 — End: 2019-01-31
  Administered 2019-01-31 (×2): 17 g via ORAL

## 2019-01-30 MED ORDER — ZINC SULFATE 220 (50) MG PO CAP
220 mg | Freq: Every day | ORAL | 0 refills | Status: DC
Start: 2019-01-30 — End: 2019-01-31
  Administered 2019-01-31: 14:00:00 220 mg via ORAL

## 2019-01-30 MED ORDER — PHYTONADIONE (VITAMIN K1) 5 MG PO TAB
5 mg | Freq: Once | ORAL | 0 refills | Status: CP
Start: 2019-01-30 — End: ?
  Administered 2019-01-31: 05:00:00 5 mg via ORAL

## 2019-01-30 MED ORDER — DULOXETINE 60 MG PO CPDR
60 mg | Freq: Every day | ORAL | 0 refills | Status: DC
Start: 2019-01-30 — End: 2019-01-31
  Administered 2019-01-31: 14:00:00 60 mg via ORAL

## 2019-01-30 MED ORDER — PANTOPRAZOLE 40 MG IV SOLR
40 mg | Freq: Every day | INTRAVENOUS | 0 refills | Status: DC
Start: 2019-01-30 — End: 2019-01-31
  Administered 2019-01-31: 14:00:00 40 mg via INTRAVENOUS

## 2019-01-30 MED ORDER — CIPROFLOXACIN HCL 500 MG PO TAB
500 mg | Freq: Every evening | ORAL | 0 refills | Status: DC
Start: 2019-01-30 — End: 2019-01-31
  Administered 2019-01-31: 04:00:00 500 mg via ORAL

## 2019-01-30 MED ORDER — INSULIN ASPART 100 UNIT/ML SC FLEXPEN
0-6 [IU] | Freq: Before meals | SUBCUTANEOUS | 0 refills | Status: DC
Start: 2019-01-30 — End: 2019-01-31
  Administered 2019-01-31: 17:00:00 1 [IU] via SUBCUTANEOUS

## 2019-01-30 MED ORDER — ONDANSETRON HCL 8 MG PO TAB
4 mg | ORAL | 0 refills | Status: DC | PRN
Start: 2019-01-30 — End: 2019-01-31

## 2019-01-30 MED ORDER — ATORVASTATIN 10 MG PO TAB
10 mg | Freq: Every day | ORAL | 0 refills | Status: DC
Start: 2019-01-30 — End: 2019-01-31
  Administered 2019-01-31: 14:00:00 10 mg via ORAL

## 2019-01-30 MED ORDER — ONDANSETRON HCL (PF) 4 MG/2 ML IJ SOLN
4 mg | INTRAVENOUS | 0 refills | Status: DC | PRN
Start: 2019-01-30 — End: 2019-01-31

## 2019-01-30 NOTE — Telephone Encounter
Patient left vm stating that her call what cut off when she was speaking to [name] about her labs. Plz call again.

## 2019-01-30 NOTE — ED Notes
Patient is a 63 year old female that presents with low Hgb per PCP. Patient has a history of non-alcoholic cirrhosis and type II DM. Reports she has had two falls the last two days at home, but no change in LOC and denies injury. Patient reports episodes of light headedness and generalized malaise the last several days. Patient is pale in appearance. Alert/oriented x4. PIV placed. Bed in lowest position and siderails up, husband at bedside.

## 2019-01-31 ENCOUNTER — Inpatient Hospital Stay: Admit: 2019-01-30 | Discharge: 2019-01-31 | Disposition: A | Discharge status: Home / Self Care | Payer: MEDICARE

## 2019-01-31 DIAGNOSIS — E119 Type 2 diabetes mellitus without complications: ICD-10-CM

## 2019-01-31 DIAGNOSIS — I10 Essential (primary) hypertension: ICD-10-CM

## 2019-01-31 DIAGNOSIS — K7581 Nonalcoholic steatohepatitis (NASH): ICD-10-CM

## 2019-01-31 DIAGNOSIS — K7460 Unspecified cirrhosis of liver: ICD-10-CM

## 2019-01-31 DIAGNOSIS — K3189 Other diseases of stomach and duodenum: ICD-10-CM

## 2019-01-31 DIAGNOSIS — K31811 Angiodysplasia of stomach and duodenum with bleeding: ICD-10-CM

## 2019-01-31 DIAGNOSIS — D62 Acute posthemorrhagic anemia: Principal | ICD-10-CM

## 2019-01-31 DIAGNOSIS — Z90710 Acquired absence of both cervix and uterus: ICD-10-CM

## 2019-01-31 DIAGNOSIS — K766 Portal hypertension: ICD-10-CM

## 2019-01-31 DIAGNOSIS — K5730 Diverticulosis of large intestine without perforation or abscess without bleeding: ICD-10-CM

## 2019-01-31 DIAGNOSIS — N179 Acute kidney failure, unspecified: ICD-10-CM

## 2019-01-31 DIAGNOSIS — E872 Acidosis: ICD-10-CM

## 2019-01-31 LAB — POC GLUCOSE
Lab: 157 mg/dL — ABNORMAL HIGH (ref 70–100)
Lab: 110 mg/dL — ABNORMAL HIGH (ref 70–100)
Lab: 210 mg/dL — ABNORMAL HIGH (ref 70–100)

## 2019-01-31 LAB — URINALYSIS DIPSTICK REFLEX TO CULTURE
Lab: NEGATIVE
Lab: NEGATIVE

## 2019-01-31 LAB — URINALYSIS MICROSCOPIC REFLEX TO CULTURE

## 2019-01-31 LAB — BETA HYDROXYBUTYRATE (KETONES): Lab: 0.2 MMOL/L — ABNORMAL LOW (ref <0.3)

## 2019-01-31 LAB — COMPREHENSIVE METABOLIC PANEL
Lab: 136 MMOL/L — ABNORMAL LOW (ref 137–147)
Lab: 28 mg/dL — ABNORMAL HIGH (ref 7–25)
Lab: 9.9 mg/dL — ABNORMAL HIGH (ref 8.5–10.6)

## 2019-01-31 LAB — CBC: Lab: 6.8 10*3/uL (ref 4.5–11.0)

## 2019-01-31 LAB — LACTIC ACID(LACTATE): Lab: 2.6 MMOL/L — ABNORMAL HIGH (ref 0.5–2.0)

## 2019-01-31 MED ORDER — PANTOPRAZOLE 40 MG IV SOLR
40 mg | Freq: Two times a day (BID) | INTRAVENOUS | 0 refills | Status: DC
Start: 2019-01-31 — End: 2019-01-31

## 2019-01-31 MED ORDER — LACTATED RINGERS IV SOLP
500 mL | Freq: Once | INTRAVENOUS | 0 refills | Status: CP
Start: 2019-01-31 — End: ?
  Administered 2019-01-31: 06:00:00 500 mL via INTRAVENOUS

## 2019-01-31 MED ORDER — MAGNESIUM SULFATE IN D5W 1 GRAM/100 ML IV PGBK
1 g | INTRAVENOUS | 0 refills | Status: CP
Start: 2019-01-31 — End: ?
  Administered 2019-01-31 (×3): 1 g via INTRAVENOUS

## 2019-01-31 NOTE — Progress Notes
General Progress Note    Name:  Kristin Moody   ZOXWR'U Date:  01/31/2019  Admission Date: 01/30/2019  LOS: 1 day                     Assessment/Plan:    Active Problems:    * No active hospital problems. *    Kristin Moody is a 63 y.o. 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 Pt reports dizziness with two near syncopal events with falls this past week.   ???  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 Hgb7.9 this am. Hepatologyconsulted, no additional interventions this admission, they will arrange for outpt iron. Held diuretics due to [facility], will resume on discharge. Cont cipro ppx. INR 1.3 on 2/19, pt given vitamin K.   ???  2)Renal AKI, elevated anion gap  -creat 0.7-8 at baseline  -creat 1.4 on admission imporved ot 1.06 today  -gap acidosis had resolved on repeat labs after transfusion    ???  3)Endo- dm, weight loss  -will hold metformin on discharge due to elevated lactic acid.   -TSH 2.6 on 1/27     I have spent 45 minutes evaluating pt and preparing discharge.     ________________________________________________________________________    Subjective  Kristin Moody is a 63 y.o. female.  Patient report doing well denies dizziness nausea or dyspnea would like to discharge to home today.     Medications  Scheduled Meds:atorvastatin (LIPITOR) tablet 10 mg, 10 mg, Oral, QDAY  ciprofloxacin (CIPRO) tablet 500 mg, 500 mg, Oral, QHS  duloxetine DR (CYMBALTA) capsule 60 mg, 60 mg, Oral, QDAY  insulin aspart U-100 (NOVOLOG FLEXPEN) injection PEN 0-6 Units, 0-6 Units, Subcutaneous, ACHS (22) lactulose oral solution 6.67 g, 6.67 g, Oral, TID  pantoprazole (PROTONIX) injection 40 mg, 40 mg, Intravenous, QDAY  polyethylene glycol 3350 (MIRALAX) packet 17 g, 17 g, Oral, BID  zinc sulfate capsule 220 mg, 220 mg, Oral, QDAY    Continuous Infusions:  PRN and Respiratory Meds:ondansetron Q6H PRN **OR** ondansetron (ZOFRAN) IV Q6H PRN        Objective:                          Vital Signs: Last Filed                 Vital Signs: 24 Hour Range   BP: 117/62 (03/14 0543)  Temp: 36.8 ???C (98.2 ???F) (03/14 0543)  Pulse: 84 (03/14 0543)  Respirations: 17 PER MINUTE (03/14 0543)  SpO2: 94 % (03/14 0543)  SpO2 Pulse: 99 (03/13 2159)  Height: 160 cm (63) (03/13 2217) BP: (97-119)/(53-77)   Temp:  [36.5 ???C (97.7 ???F)-37.4 ???C (99.4 ???F)]   Pulse:  [84-107]   Respirations:  [12 PER MINUTE-20 PER MINUTE]   SpO2:  [94 %-100 %]    Intensity Pain Scale (Self Report): 0 (01/31/19 0817) Vitals:    01/30/19 1659 01/30/19 2217   Weight: 59.9 kg (132 lb) 60.1 kg (132 lb 8 oz)       Intake/Output Summary:  (Last 24 hours)  Intake/Output Summary (Last 24 hours) at 01/31/2019 0936  Last data filed at 01/31/2019 0543  Gross per 24 hour   Intake 2400.28 ml   Output ???   Net 2400.28 ml           Physical Exam  General:  Alert, cooperative, no distress, appears stated age  Lungs:  Clear to auscultation bilaterally  Heart:   Regular rate and rhythm, S1, S2 normal, no murmur, click rub or gallop  Abdomen:  Soft, non-tender.  Bowel sounds normal.  No masses.  No organomegaly.  Extremities: Extremities normal, atraumatic, no cyanosis or edema    Lab Review  24-hour labs:    Results for orders placed or performed during the hospital encounter of 01/30/19 (from the past 24 hour(s))   POC BLOOD GAS VEN    Collection Time: 01/30/19  5:37 PM   Result Value Ref Range    PH-VEN-POC 7.44 (H) 7.30 - 7.40    PCO2-VEN-POC 28 (L) 36 - 50 MMHG    PO2-VEN-POC 26 (L) 33 - 48 MMHG    Base Def-VEN-POC 6.0 MMOL/L    O2 Sat-VEN-POC 51.0 (L) 55 - 71 % Bicarbonate-VEN-POC 18.7 MMOL/L   POC HEMATOCRIT    Collection Time: 01/30/19  5:37 PM   Result Value Ref Range    Hemoglobin POC 8.2 (L) 12.0 - 15.0 GM/DL    Hematocrit POC 16.1 (L) 36 - 45 %   POC POTASSIUM    Collection Time: 01/30/19  5:37 PM   Result Value Ref Range    Potassium-POC 4.5 3.5 - 5.1 MMOL/L   POC SODIUM    Collection Time: 01/30/19  5:37 PM   Result Value Ref Range    Sodium-POC 137 137 - 147 MMOL/L   POC IONIZED CALCIUM    Collection Time: 01/30/19  5:37 PM   Result Value Ref Range    Ionized Calcium-POC 1.22 1.0 - 1.3 MMOL/L   POC TROPONIN    Collection Time: 01/30/19  5:37 PM   Result Value Ref Range    Troponin-I-POC 0.01 0.00 - 0.05 NG/ML   CBC AND DIFF    Collection Time: 01/30/19  5:39 PM   Result Value Ref Range    White Blood Cells 7.4 4.5 - 11.0 K/UL    RBC 3.28 (L) 4.0 - 5.0 M/UL    Hemoglobin 6.6 (L) 12.0 - 15.0 GM/DL    Hematocrit 09.6 (L) 36 - 45 %    MCV 68.3 (L) 80 - 100 FL    MCH 20.2 (L) 26 - 34 PG    MCHC 29.6 (L) 32.0 - 36.0 G/DL    RDW 04.5 (H) 11 - 15 %    Platelet Count 250 150 - 400 K/UL    MPV 9.0 7 - 11 FL    Neutrophils 79 (H) 41 - 77 %    Lymphocytes 14 (L) 24 - 44 %    Monocytes 6 4 - 12 %    Eosinophils 0 0 - 5 %    Basophils 1 0 - 2 %    Absolute Neutrophil Count 5.90 1.8 - 7.0 K/UL    Absolute Lymph Count 1.00 1.0 - 4.8 K/UL    Absolute Monocyte Count 0.40 0 - 0.80 K/UL    Absolute Eosinophil Count 0.00 0 - 0.45 K/UL    Absolute Basophil Count 0.00 0 - 0.20 K/UL   COMPREHENSIVE METABOLIC PANEL    Collection Time: 01/30/19  5:39 PM   Result Value Ref Range  Sodium 138 137 - 147 MMOL/L    Potassium 4.6 3.5 - 5.1 MMOL/L    Chloride 99 98 - 110 MMOL/L    Glucose 91 70 - 100 MG/DL    Blood Urea Nitrogen 27 (H) 7 - 25 MG/DL    Creatinine 1.61 (H) 0.4 - 1.00 MG/DL    Calcium 09.6 8.5 - 04.5 MG/DL    Total Protein 7.2 6.0 - 8.0 G/DL    Total Bilirubin 1.2 0.3 - 1.2 MG/DL    Albumin 4.0 3.5 - 5.0 G/DL    Alk Phosphatase 409 25 - 110 U/L    AST (SGOT) 38 7 - 40 U/L CO2 18 (L) 21 - 30 MMOL/L    ALT (SGPT) 19 7 - 56 U/L    Anion Gap 21 (H) 3 - 12    eGFR Non African American 38 (L) >60 mL/min    eGFR African American 46 (L) >60 mL/min   TYPE & CROSSMATCH    Collection Time: 01/30/19  5:39 PM   Result Value Ref Range    Units Ordered 2     Crossmatch Expires 02/02/2019     Record Check FOUND     ABO/RH(D) O NEG     Antibody Screen NEG     Electronic Crossmatch YES     Unit Number [id number]     Blood Component Type RBC,ADSOL,LEUKO REDUCED     Unit Division 0     Status OF Unit TRANSFUSED     Transfusion Status OK TO TRANSFUSE     Crossmatch Result COMPATIBLE,ELECTRONIC     Unit Number [id number]     Blood Component Type RBC,ADSOL,LEUKO REDUCED     Unit Division 0     Status OF Unit REL FROM [facility]     Transfusion Status OK TO TRANSFUSE     Crossmatch Result COMPATIBLE,ELECTRONIC     Unit Number [id number]     Blood Component Type RBC,ADSOL,LEUKO REDUCED,1ST CONT.     Unit Division 0     Status OF Unit ISSUED     Transfusion Status OK TO TRANSFUSE     Crossmatch Result COMPATIBLE,ELECTRONIC    BLOOD BANK SAMPLE HOLD    Collection Time: 01/30/19  7:03 PM   Result Value Ref Range    BB Sample hold IN LAB    POC GLUCOSE    Collection Time: 01/30/19 11:18 PM   Result Value Ref Range    Glucose, POC 157 (H) 70 - 100 MG/DL   CBC    Collection Time: 01/30/19 11:25 PM   Result Value Ref Range    White Blood Cells 6.8 4.5 - 11.0 K/UL    RBC 3.26 (L) 4.0 - 5.0 M/UL    Hemoglobin 7.0 (L) 12.0 - 15.0 GM/DL    Hematocrit 27.2 (L) 36 - 45 %    MCV 70.6 (L) 80 - 100 FL    MCH 21.5 (L) 26 - 34 PG    MCHC 30.5 (L) 32.0 - 36.0 G/DL    RDW 53.6 (H) 11 - 15 %    Platelet Count 214 150 - 400 K/UL    MPV 8.9 7 - 11 FL   COMPREHENSIVE METABOLIC PANEL    Collection Time: 01/30/19 11:25 PM   Result Value Ref Range    Sodium 136 (L) 137 - 147 MMOL/L    Potassium 4.3 3.5 - 5.1 MMOL/L    Chloride 99 98 - 110 MMOL/L    Glucose 149 (H) 70 - 100 MG/DL  Blood Urea Nitrogen 28 (H) 7 - 25 MG/DL Creatinine 1.61 (H) 0.4 - 1.00 MG/DL    Calcium 9.9 8.5 - 09.6 MG/DL    Total Protein 6.6 6.0 - 8.0 G/DL    Total Bilirubin 1.6 (H) 0.3 - 1.2 MG/DL    Albumin 3.8 3.5 - 5.0 G/DL    Alk Phosphatase 045 25 - 110 U/L    AST (SGOT) 29 7 - 40 U/L    CO2 22 21 - 30 MMOL/L    ALT (SGPT) 17 7 - 56 U/L    Anion Gap 15 (H) 3 - 12    eGFR Non African American 41 (L) >60 mL/min    eGFR African American 50 (L) >60 mL/min   MAGNESIUM    Collection Time: 01/30/19 11:25 PM   Result Value Ref Range    Magnesium 1.8 1.6 - 2.6 mg/dL   BETA HYDROXYBUTYRATE (KETONES)    Collection Time: 01/30/19 11:25 PM   Result Value Ref Range    Beta Hydroxybutyrate 0.2 <0.3 MMOL/L   LACTIC ACID(LACTATE)    Collection Time: 01/30/19 11:25 PM   Result Value Ref Range    Lactic Acid 6.3 (HH) 0.5 - 2.0 MMOL/L   URINALYSIS DIPSTICK REFLEX TO CULTURE    Collection Time: 01/31/19  1:00 AM   Result Value Ref Range    Color,UA YELLOW     Turbidity,UA CLEAR CLEAR-CLEAR    Specific Gravity-Urine 1.013 1.003 - 1.035    pH,UA 5.0 5.0 - 8.0    Protein,UA NEG NEG-NEG    Glucose,UA NEG NEG-NEG    Ketones,UA NEG NEG-NEG    Bilirubin,UA NEG NEG-NEG    Blood,UA NEG NEG-NEG    Urobilinogen,UA NORMAL NORM-NORMAL    Nitrite,UA NEG NEG-NEG    Leukocytes,UA TRACE (A) NEG-NEG    Urine Ascorbic Acid, UA NEG NEG-NEG   URINALYSIS MICROSCOPIC REFLEX TO CULTURE    Collection Time: 01/31/19  1:00 AM   Result Value Ref Range    WBCs,UA 2-10 0 - 2 /HPF    RBCs,UA 0-2 0 - 3 /HPF    Comment,UA       Criteria for reflex to culture are WBC>10, Positive Nitrite, and/or >=+1   leukocytes. If quantity is not sufficient, an addendum will follow.      MucousUA TRACE     Squamous Epithelial Cells 0-2 0 - 5    Hyaline Cast PACKED    CBC    Collection Time: 01/31/19  7:03 AM   Result Value Ref Range    White Blood Cells 6.1 4.5 - 11.0 K/UL    RBC 3.41 (L) 4.0 - 5.0 M/UL    Hemoglobin 7.9 (L) 12.0 - 15.0 GM/DL    Hematocrit 40.9 (L) 36 - 45 %    MCV 72.1 (L) 80 - 100 FL

## 2019-01-31 NOTE — Consults
Gastroenterology Consult Note  Patient Name:Kristin Moody         NWG:[id number]  Admission Date: 01/30/2019  5:21 PM      Active Problems:    * No active hospital problems. *      History of Present Illness/Subjective:  Kristin Moody is a 63 y.o. 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  Hepatology has been consulted for further management.  Patient is very well-known to hepatology service and follows with Dr. Ladona Ridgel in transplant hepatology clinic.  Patient has been suffering from GAVE for which she has been getting periodic iron infusions every 2 weeks at [facility].  Patient comes in because she was feeling more fatigued and short of breath at home with a hemoglobin level of 6.6.  Patient was then brought to [place] for further management where she received 1 unit of blood products and she is doing much better today in the morning.  Patient denies any melena, hematochezia, hematemesis.  However she does endorse some of her stools look black because of her iron pills.  Patient has been dealing with this for quite a while and has no other complaints.  Patient's last EGD was on 01/22/2019 where she also has portal gastropathy GAVE.  Patient denies any travel, trauma, sick contacts.  Patient denies having any other complaints.    Assessment/ Plan:    Acute on chronic anemia  -Likely from GAVE  -Last EGD 01/22/2019 with no other significant findings other than GAVE  -Patient's hemoglobin has responded well to 1 unit of packed RBCs given here during this admission  -She has been following with [facility] hepatology, who have been scheduling her with every 2-week iron infusions at [facility]    NASH cirrhosis-compensated  MELD-Na score: 11 at 01/07/2019  2:11 PM MELD score: 11 at 01/07/2019  2:11 PM  Calculated from:  Serum Creatinine: 1.04 MG/DL at 8/65/7846  9:62 PM  Serum Sodium: 134 MMOL/L at 01/07/2019  2:11 PM  Total Bilirubin: 1.7 MG/DL at 9/52/8413  2:44 PM  INR(ratio): 1.3 at 01/07/2019  2:11 PM  Age: 3 years        Recommendations:  -We do not recommend any new interventions at this point, patient has responded very well to her packed RBCs  -We will set her up in our clinic in follow-up with frequent iron infusions patient-continue PTA medications    Hepatology will sign off.  Please let us know with any other questions.    Patient seen/discussed with Dr. Eloise Harman, MD  GI/Hepatology Fellow [phone]    -----------------------------  PMH:  Medical History:   Diagnosis Date   ??? Cirrhosis of liver (HCC)     decompensated liver failure   ??? Diverticulosis of colon     descending and sigmoid colon   ??? Dyspnea    ??? Essential hypertension 08/13/2012   ??? Fatty infiltration of liver    ??? HTN (hypertension)    ??? Obesity (BMI 30-39.9) 05/13/2011   ??? Preop cardiovascular exam 01/07/2019   ??? Sinus infection    ??? Type 2 diabetes mellitus (HCC) 09/21/2014       Current medications:  No current facility-administered medications on file prior to encounter.      Current Outpatient Medications on File Prior to Encounter  Medication Sig Dispense Refill   ??? atorvastatin (LIPITOR) 10 mg tablet TAKE 1 TABLET BY MOUTH EVERY DAY 90 tablet 3   ??? ciprofloxacin (CIPRO) 500 mg tablet Take one tablet by mouth daily. (Patient taking differently: Take 500 mg by mouth at bedtime daily.) 90 tablet 3   ??? duloxetine DR (CYMBALTA) 60 mg capsule Take 60 mg by mouth daily.     ??? ferrous sulfate (FEOSOL) 325 mg (65 mg iron) tablet Take one tablet by mouth twice daily. Take on an empty stomach at least 1 hour before or 2 hours after food. 90 tablet 1   ??? furosemide (LASIX) 40 mg tablet Take 1.5 tablets by mouth daily. (Patient taking differently: Take 80 mg by mouth daily.) 45 tablet 0 VARICES - FLEXIBLE N/A 04/10/2018    Performed by Normajean Baxter, MD at [facility] ENDO   ??? ESOPHAGOGASTRODUODENOSCOPY WITH BIOPSY - FLEXIBLE N/A 04/10/2018    Performed by Normajean Baxter, MD at [facility] ENDO   ??? ESOPHAGOGASTRODUODENOSCOPY WITH CONTROL OF BLEEDING - FLEXIBLE N/A 04/25/2018    Performed by Jolee Ewing, MD at [facility] ENDO   ??? ESOPHAGOGASTRODUODENOSCOPY WITH BIOPSY - FLEXIBLE with push enteroscopy N/A 08/28/2018    Performed by Celesta Gentile, MD at [facility] ENDO   ??? EGD N/A 10/27/2018    Performed by Eliott Nine, MD at [facility] ENDO   ??? ESOPHAGOGASTRODUODENOSCOPY WITH SNARE REMOVAL TUMOR/ POLYP/ OTHER LESION - FLEXIBLE N/A 10/27/2018    Performed by Eliott Nine, MD at [facility] ENDO   ??? ESOPHAGOGASTRODUODENOSCOPY WITH BIOPSY - FLEXIBLE N/A 12/16/2018    Performed by Buckles, Vinnie Level, MD at [facility] ENDO   ??? ESOPHAGOGASTRODUODENOSCOPY WITH CONTROL OF BLEEDING - FLEXIBLE N/A 12/16/2018    Performed by Buckles, Vinnie Level, MD at [facility] ENDO   ??? ESOPHAGOGASTRODUODENOSCOPY WITH SPECIMEN COLLECTION BY BRUSHING/ WASHING N/A 01/22/2019    Performed by Veneta Penton, MD at [facility] ENDO   ??? ESOPHAGOGASTRODUODENOSCOPY WITH DILATION ESOPHAGUS WITH BALLOON 30 MM OR GREATER - FLEXIBLE N/A 01/22/2019    Performed by Veneta Penton, MD at [facility] ENDO   ??? ESOPHAGOGASTRODUODENOSCOPY WITH BIOPSY - FLEXIBLE N/A 01/22/2019    Performed by Veneta Penton, MD at [facility] ENDO   ??? CHOLECYSTECTOMY     ??? COLONOSCOPY     ??? ESOPHAGOGASTRIC FUNDOPLICATION  2003, 2004    laparoscopic   ??? PR ESOPHAGOSCOPY FLEXIBLE TRANSORAL DIAGNOSTIC         SH:  Social History     Socioeconomic History   ??? Marital status: Married     Spouse name: Not on file   ??? Number of children: 1   ??? Years of education: Not on file   ??? Highest education level: Not on file   Occupational History   ??? Occupation: Geographical information systems officer: EMPLOYED   Tobacco Use   ??? Smoking status: Never Smoker   ??? Smokeless tobacco: Never Used   Substance and Sexual Activity   ??? Alcohol use: No   ??? Drug use: No ??? Sexual activity: Not on file   Other Topics Concern   ??? Not on file   Social History Narrative    Negative  For tobacco, alcohol, or illicit drug use.  No high risk sexual behavior or tattoo placement and no transfusion prior to 1992.  Married, one child healthy.  Works in Radio broadcast assistant.       FH:  Family History   Problem Relation Age of Onset   ??? Other  Mother    ??? Kidney Failure Mother    ??? Cancer Father         esophageal   ??? Liver Disease Sister         endstage, s/p liver transplant   ??? Cancer Brother         tonsil       Review of Systems:  Constitutional: Fatigue  Eyes: No vision deficit, no icterus  Ears, nose, mouth: No oral bleeding, ulcer  Cardiovascular: No chest pain, palpitations  Respiratory: Shortness of breath  Gastrointestinal: No abdominal pain, no blood in stool  Musculoskeltal: No joint inflammation, new deformity  Integumentary: No rashes, exudate  Neurologic: No cognitive change, focal weakness  Hematologic: No for easy bleeding or bruising  Please see HPI for additional pertinent documentation    Physical Exam:  Vitals:    01/31/19 1007 01/31/19 1116 01/31/19 1121 01/31/19 1124   BP: 101/48 108/62 109/60 106/68   BP Source: Arm, Left Upper Arm, Right Upper Arm, Right Upper Arm, Right Upper   Pulse: 92 88 88 92   Temp: 36.7 ???C (98 ???F)  36.8 ???C (98.3 ???F)    SpO2: 99%  100%    Weight:       Height:         Constitutional- Vitals above, no acute distress.   Head - Normocephalic, atraumatic.   Eyes -  EOMI grossly. Palor   Ears, nose, mouth, throat-red lips with chelosis   Neck - No swelling or tracheal deviation.   Respiratory - Symmetric chest rise, no increased work of breathing.  Cardiovascular - Peripheral pulses intact, no pedal edema.  Gastrointestinal- Non-TTP. No hepatospenomegaly.   Skin - No exposed rash, lesion.  Neurologic - No CN deficit, normal fluid speech  Psychiatric - Judgement intact, thought content appropriate.    Labs/Imaging:

## 2019-01-31 NOTE — Discharge Instructions - Pharmacy
Neck:    Supple, symmetrical, trachea midline, no adenopathy, thyroid: no enlargement/tenderness/nodules, no carotid bruit and no JVD  Back:  Symmetric, no curvature, ROM normal.  No CVA tenderness.  Lungs:  Clear to auscultation bilaterally  Heart:   Regular rate and rhythm, S1, S2 normal, no murmur, click rub or gallop  Abdomen:  Soft, non-tender.  Bowel sounds normal.  No masses.  No organomegaly.  Extremities: Extremities normal, atraumatic, no cyanosis or edema  Skin: Skin color, texture, turgor normal.  No rashes or lesions  Lymph nodes:  Cervical, supraclavicular and axillary nodes normal  Neurologic:   CNII - XII intact.  Normal strength, sensation and reflexes throughout.        Admission Lab/Radiology studies notable for: Hgb 6.6    Brief Hospital Course:  The patient was admitted and the following issues were addressed during this hospitalization: (with pertinent details).    Kristin Moody is a 63 y.o. 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 Pt reports dizziness with two near syncopal events with falls this past week.   ???  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 Hgb 6.6, pt received 2 unit  PRBC. Hgb 7.9 this am. Hepatology consulted, no additional interventions this admission, they will arrange for outpt iron. Held diuretics due to [facility], will resume on discharge. Cont cipro ppx. INR 1.3 on 2/19, pt given vitamin K.   ???  2)Renal AKI, elevated anion gap on admission. Creat 0.7-8 at baseline, creat 1.4 on admission. Creatinine improved to 1.06 today. Gap acidosis had resolved on repeat labs after transfusion. Lactic acid was 6.3 on Qty: 45 tablet, Refills: 0    PRESCRIPTION TYPE:  Normal      methylprednisolone (MEDROL) 32 mg tablet Take 32mg  by mouth 12 hours before procedure, and another 32mg  by mouth 2 hours before procedure.  Qty: 2 tablet, Refills: 0    PRESCRIPTION TYPE:  Normal      pantoprazole DR (PROTONIX) 40 mg tablet Take one tablet by mouth daily.  Qty: 90 tablet, Refills: 3    PRESCRIPTION TYPE:  Normal      polyethylene glycol 3350 (MIRALAX) 17 g packet Take one packet by mouth twice daily.    PRESCRIPTION TYPE:  OTC      potassium chloride SR (K-DUR) 20 mEq tablet Take one tablet by mouth daily. Take with a meal and a full glass of water.  Qty: 90 tablet, Refills: 0    PRESCRIPTION TYPE:  Normal      promethazine (PHENERGAN) 25 mg tablet Take 25 mg by mouth every 6 hours as needed.    PRESCRIPTION TYPE:  Historical Med      rifAXIMin (XIFAXAN) 550 mg tablet Take one tablet by mouth twice daily.  Qty: 60 tablet, Refills: 11    PRESCRIPTION TYPE:  Normal      spironolactone (ALDACTONE) 50 mg tablet Take one tablet by mouth daily. Take with food.  Qty: 90 tablet, Refills: 0    PRESCRIPTION TYPE:  Normal      vitamin A 10,000 unit capsule Take one capsule by mouth daily.  Qty: 30 capsule, Refills: 1    PRESCRIPTION TYPE:  Normal  zinc sulfate 220 mg (50 mg elemental zinc) capsule Take one capsule by mouth daily.  Qty: 90 capsule, Refills: 3    PRESCRIPTION TYPE:  Normal          The following medications were removed from your list. This list includes medications discontinued this stay and those removed from your prior med list in our system        lactulose 10 gram/15 mL oral solution        metFORMIN (GLUCOPHAGE) 500 mg tablet            Scheduled appointments:    Feb 06, 2019 12:00 PM CDT  Return Patient with [name], APRN  [facility] ([facility] [place]) [address]  [address]  [place] [address]  [phone]   Feb 11, 2019  7:15 AM CDT  Infusion with [id number]

## 2019-02-03 ENCOUNTER — Encounter: Admit: 2019-02-03 | Discharge: 2019-02-03 | Payer: MEDICARE

## 2019-02-03 DIAGNOSIS — D500 Iron deficiency anemia secondary to blood loss (chronic): ICD-10-CM

## 2019-02-03 DIAGNOSIS — K31819 Angiodysplasia of stomach and duodenum without bleeding: Principal | ICD-10-CM

## 2019-02-03 DIAGNOSIS — K7460 Unspecified cirrhosis of liver: ICD-10-CM

## 2019-02-03 LAB — CBC AND DIFF
Lab: 1.1
Lab: 6.6
Lab: 78 — ABNORMAL HIGH (ref 40.0–75.0)
Lab: 0.8 10*3/uL — ABNORMAL LOW (ref 0.9–5.1)
Lab: 18 % — ABNORMAL HIGH (ref 11.5–14.5)
Lab: 20 pg — ABNORMAL LOW (ref 27.0–31.0)
Lab: 276 10*3/uL
Lab: 29 % — ABNORMAL LOW (ref 30.0–34.0)
Lab: 70 — ABNORMAL LOW (ref 80.0–99.0)

## 2019-02-03 LAB — PROTIME INR (PT)
Lab: 1.2
Lab: 13 s — ABNORMAL HIGH (ref 9.9–12.6)

## 2019-02-04 ENCOUNTER — Encounter: Admit: 2019-02-04 | Discharge: 2019-02-04 | Payer: MEDICARE

## 2019-02-05 ENCOUNTER — Encounter: Admit: 2019-02-05 | Discharge: 2019-02-05 | Payer: MEDICARE

## 2019-02-05 NOTE — Telephone Encounter
02/03/2019: Cancelled by [name] - due to COVID 19 concerns.

## 2019-02-12 ENCOUNTER — Encounter: Admit: 2019-02-12 | Discharge: 2019-02-12 | Payer: MEDICARE

## 2019-02-12 DIAGNOSIS — D509 Iron deficiency anemia, unspecified: Principal | ICD-10-CM

## 2019-02-12 NOTE — Telephone Encounter
Reviewed labs with pt.  She reported that she is rescheduled for iron infusion on April 9th.  We will recheck labs after that infusion.  Pt v/u.

## 2019-02-13 ENCOUNTER — Encounter: Admit: 2019-02-13 | Discharge: 2019-02-13 | Payer: MEDICARE

## 2019-02-13 DIAGNOSIS — K7460 Unspecified cirrhosis of liver: Principal | ICD-10-CM

## 2019-02-17 ENCOUNTER — Encounter: Admit: 2019-02-17 | Discharge: 2019-02-17 | Payer: MEDICARE

## 2019-02-17 DIAGNOSIS — D509 Iron deficiency anemia, unspecified: Principal | ICD-10-CM

## 2019-02-17 LAB — CBC AND DIFF
Lab: 4.2
Lab: 5.9
Lab: 77 — ABNORMAL LOW (ref 80.0–99.0)
Lab: 0.1
Lab: 0.2 10*3/uL (ref 0.0–0.2)
Lab: 0.8 10*3/uL (ref 0.1–0.9)
Lab: 1.5
Lab: 1.7 10*3/uL (ref 0.9–5.1)
Lab: 1.9
Lab: 19
Lab: 21 % — ABNORMAL HIGH (ref 11.5–14.5)
Lab: 22 pg — ABNORMAL LOW (ref 27.0–31.0)
Lab: 230 10*3/uL
Lab: 29 g/dL — ABNORMAL LOW (ref 30.0–34.0)
Lab: 33 % — ABNORMAL LOW (ref 37.0–47.0)
Lab: 68
Lab: 8.6 10*3/uL
Lab: 8.9
Lab: 9.8 g/dL — ABNORMAL LOW (ref 12.0–16.0)

## 2019-02-19 ENCOUNTER — Encounter: Admit: 2019-02-19 | Discharge: 2019-02-19 | Payer: MEDICARE

## 2019-02-19 DIAGNOSIS — Z01810 Encounter for preprocedural cardiovascular examination: ICD-10-CM

## 2019-02-19 DIAGNOSIS — I10 Essential (primary) hypertension: ICD-10-CM

## 2019-02-19 DIAGNOSIS — K7460 Unspecified cirrhosis of liver: ICD-10-CM

## 2019-02-19 DIAGNOSIS — R0600 Dyspnea, unspecified: ICD-10-CM

## 2019-02-19 DIAGNOSIS — E119 Type 2 diabetes mellitus without complications: ICD-10-CM

## 2019-02-19 DIAGNOSIS — J329 Chronic sinusitis, unspecified: ICD-10-CM

## 2019-02-19 DIAGNOSIS — K5730 Diverticulosis of large intestine without perforation or abscess without bleeding: ICD-10-CM

## 2019-02-19 DIAGNOSIS — K760 Fatty (change of) liver, not elsewhere classified: Principal | ICD-10-CM

## 2019-02-19 DIAGNOSIS — E669 Obesity, unspecified: ICD-10-CM

## 2019-02-19 NOTE — Progress Notes
Date of Service: 02/19/2019              Chief Complaint   Patient presents with   ??? Abdominal pain       History of Present Illness    She is a 63 year old female with cirrhosis due to NASH, essential hypertension, varices bleeding varices with previous bleeding, and diabetes who presents with a celiac artery saccular aneurysm.  This was identified on a noncontrast CT scan in June 2019.  This was better evaluated on a CT angiogram done recently.  This was noted to be a saccular appearing aneurysm measuring approximately 2.2 to 2.4 cm in maximum diameter.  There did not appear to be significant change in diameter from June 2019 to scan in October 2019 and most recent scan.  She has some chronic abdominal discomfort that has not changed recently.  She is being evaluated for liver transplant.  She has had previous history of esophageal variceal bleeding, but denies any recent episodes of bleeding.  She denies any recent chest pain or shortness of breath.  She denies any fevers or chills.           Medical History:   Diagnosis Date   ??? Cirrhosis of liver (HCC)     decompensated liver failure   ??? Diverticulosis of colon     descending and sigmoid colon   ??? Dyspnea    ??? Essential hypertension 08/13/2012   ??? Fatty infiltration of liver    ??? HTN (hypertension)    ??? Obesity (BMI 30-39.9) 05/13/2011   ??? Preop cardiovascular exam 01/07/2019   ??? Sinus infection    ??? Type 2 diabetes mellitus (HCC) 09/21/2014       Surgical History:   Procedure Laterality Date   ??? HYSTERECTOMY  1994   ??? UPPER GASTROINTESTINAL ENDOSCOPY  2009   ??? COLONOSCOPY  2009   ??? CYSTOCELE REPAIR  2009    with endocele repair   ??? RECTOCELE REPAIR  03/2009   ??? LIVER BIOPSY  07/17/2010   ??? COLONOSCOPY N/A 11/08/2017    Performed by Dawna Part, MD at [facility] ENDO   ??? ESOPHAGOGASTRODUODENOSCOPY N/A 11/08/2017    Performed by Dawna Part, MD at [facility] ENDO   ??? ESOPHAGOGASTRODUODENOSCOPY WITH BIOPSY - FLEXIBLE  11/08/2017    Performed by Dawna Part, MD at [facility] ENDO

## 2019-02-20 ENCOUNTER — Ambulatory Visit: Admit: 2019-02-19 | Discharge: 2019-02-20 | Payer: MEDICARE

## 2019-02-20 DIAGNOSIS — R188 Other ascites: ICD-10-CM

## 2019-02-20 DIAGNOSIS — I671 Cerebral aneurysm, nonruptured: ICD-10-CM

## 2019-02-20 DIAGNOSIS — I728 Aneurysm of other specified arteries: Principal | ICD-10-CM

## 2019-02-20 DIAGNOSIS — K7460 Unspecified cirrhosis of liver: ICD-10-CM

## 2019-02-20 DIAGNOSIS — I8500 Esophageal varices without bleeding: ICD-10-CM

## 2019-02-25 MED ORDER — IRON DEXTRAN IVPB
975 mg | Freq: Once | INTRAVENOUS | 0 refills | Status: CN
Start: 2019-02-25 — End: ?

## 2019-02-25 MED ORDER — IRON DEXTRAN IVPB
25 mg | Freq: Once | INTRAVENOUS | 0 refills | Status: CP
Start: 2019-02-25 — End: ?
  Administered 2019-02-26 (×2): 25 mg via INTRAVENOUS

## 2019-02-25 MED ORDER — IRON DEXTRAN IVPB
25 mg | Freq: Once | INTRAVENOUS | 0 refills | Status: CN
Start: 2019-02-25 — End: ?

## 2019-02-25 MED ORDER — ACETAMINOPHEN 500 MG PO TAB
500 mg | Freq: Once | ORAL | 0 refills | Status: CN
Start: 2019-02-25 — End: ?

## 2019-02-25 MED ORDER — DIPHENHYDRAMINE HCL 25 MG PO CAP
25 mg | Freq: Once | ORAL | 0 refills | Status: CN
Start: 2019-02-25 — End: ?

## 2019-02-26 ENCOUNTER — Encounter: Admit: 2019-02-26 | Discharge: 2019-02-26 | Payer: MEDICARE

## 2019-02-26 ENCOUNTER — Ambulatory Visit: Admit: 2019-02-26 | Discharge: 2019-02-27 | Payer: MEDICARE

## 2019-02-26 DIAGNOSIS — D649 Anemia, unspecified: Secondary | ICD-10-CM

## 2019-02-26 LAB — COMPREHENSIVE METABOLIC PANEL
Lab: 1.3 mg/dL — ABNORMAL HIGH (ref 0.4–1.00)
Lab: 10 10*3/uL (ref 3–12)
Lab: 105 mg/dL — ABNORMAL HIGH (ref 70–100)
Lab: 20 mg/dL — ABNORMAL LOW (ref 7–25)
Lab: 30 U/L (ref 7–56)
Lab: 39 mL/min — ABNORMAL LOW (ref >60)

## 2019-02-26 LAB — PROTIME INR (PT): Lab: 1.1 (ref 0.8–1.2)

## 2019-02-26 LAB — CBC AND DIFF
Lab: 0.1 10*3/uL (ref 0–0.20)
Lab: 5.4 10*3/uL (ref >60)

## 2019-02-26 MED ORDER — DIPHENHYDRAMINE HCL 25 MG PO CAP
25 mg | Freq: Once | ORAL | 0 refills | Status: CP
Start: 2019-02-26 — End: ?
  Administered 2019-02-26: 14:00:00 25 mg via ORAL

## 2019-02-26 MED ORDER — IRON DEXTRAN IVPB
975 mg | Freq: Once | INTRAVENOUS | 0 refills | Status: CP
Start: 2019-02-26 — End: ?
  Administered 2019-02-26 (×2): 975 mg via INTRAVENOUS

## 2019-02-26 MED ORDER — ACETAMINOPHEN 500 MG PO TAB
500 mg | Freq: Once | ORAL | 0 refills | Status: CP
Start: 2019-02-26 — End: ?
  Administered 2019-02-26: 14:00:00 500 mg via ORAL

## 2019-02-26 NOTE — Patient Instructions
professional if you are unable to keep an appointment.  Where should I keep my medicine?  This drug is given in a hospital or clinic and will not be stored at home.  What should I tell my health care provider before I take this medicine?  They need to know if you have any of these conditions:  ??? anemia not caused by low iron levels  ??? heart disease  ??? high levels of iron in the blood  ??? kidney disease  ??? liver disease  ??? an unusual or allergic reaction to iron, other medicines, foods, dyes, or preservatives  ??? pregnant or trying to get pregnant  ??? breast-feeding  What should I watch for while using this medicine?  Visit your doctor or health care professional regularly. Tell your doctor if your symptoms do not start to get better or if they get worse. You may need blood work done while you are taking this medicine.  You may need to follow a special diet. Talk to your doctor. Foods that contain iron include: whole grains/cereals, dried fruits, beans, or peas, leafy green vegetables, and organ meats (liver, kidney).  Long-term use of this medicine may increase your risk of some cancers. Talk to your doctor about how to limit your risk.  NOTE:This sheet is a summary. It may not cover all possible information. If you have questions about this medicine, talk to your doctor, pharmacist, or health care provider. Copyright??? 2020 [employer]

## 2019-02-27 DIAGNOSIS — K7460 Unspecified cirrhosis of liver: ICD-10-CM

## 2019-02-27 DIAGNOSIS — D500 Iron deficiency anemia secondary to blood loss (chronic): Principal | ICD-10-CM

## 2019-02-27 DIAGNOSIS — K31819 Angiodysplasia of stomach and duodenum without bleeding: ICD-10-CM

## 2019-02-27 DIAGNOSIS — D509 Iron deficiency anemia, unspecified: ICD-10-CM

## 2019-03-03 ENCOUNTER — Encounter: Admit: 2019-03-03 | Discharge: 2019-03-03 | Payer: MEDICARE

## 2019-03-03 DIAGNOSIS — K31819 Angiodysplasia of stomach and duodenum without bleeding: Principal | ICD-10-CM

## 2019-03-03 DIAGNOSIS — K760 Fatty (change of) liver, not elsewhere classified: ICD-10-CM

## 2019-03-03 DIAGNOSIS — D500 Iron deficiency anemia secondary to blood loss (chronic): ICD-10-CM

## 2019-03-09 ENCOUNTER — Encounter: Admit: 2019-03-09 | Discharge: 2019-03-09 | Payer: MEDICARE

## 2019-03-13 ENCOUNTER — Encounter: Admit: 2019-03-13 | Discharge: 2019-03-13 | Payer: MEDICARE

## 2019-03-13 DIAGNOSIS — K31819 Angiodysplasia of stomach and duodenum without bleeding: ICD-10-CM

## 2019-03-13 DIAGNOSIS — K760 Fatty (change of) liver, not elsewhere classified: ICD-10-CM

## 2019-03-13 DIAGNOSIS — K7460 Unspecified cirrhosis of liver: Principal | ICD-10-CM

## 2019-03-13 DIAGNOSIS — D500 Iron deficiency anemia secondary to blood loss (chronic): ICD-10-CM

## 2019-03-13 LAB — CBC AND DIFF
Lab: 0.1 10*3/uL — ABNORMAL HIGH (ref 3–12)
Lab: 0.1 U/L (ref 7–56)
Lab: 0.5 MMOL/L (ref 21–30)
Lab: 1.3 U/L (ref 7–40)
Lab: 10 g/dL — ABNORMAL LOW (ref 12.0–16.0)
Lab: 186 mg/dL — ABNORMAL HIGH (ref 7–25)
Lab: 2 g/dL (ref 3.5–5.0)
Lab: 2.3 mg/dL (ref 0.3–1.2)
Lab: 24 mg/dL — ABNORMAL LOW (ref 8.5–10.6)
Lab: 25 pg — ABNORMAL LOW (ref 27.0–31.0)
Lab: 29 % — ABNORMAL HIGH (ref 11.5–14.5)
Lab: 29 % — ABNORMAL LOW (ref 30.0–34.0)
Lab: 3.2 U/L — ABNORMAL LOW (ref 25–110)
Lab: 3.9 — ABNORMAL LOW (ref 4.20–5.40)
Lab: 33 % — ABNORMAL LOW (ref 37.0–47.0)
Lab: 5.2 10*3/uL (ref 4.8–10.8)
Lab: 61 mg/dL — ABNORMAL HIGH (ref 0.4–1.00)
Lab: 84 MMOL/L (ref 137–147)
Lab: 9.6 g/dL (ref 6.0–8.0)

## 2019-03-13 LAB — COMPREHENSIVE METABOLIC PANEL
Lab: 3.4 (ref 3.4–4.8)
Lab: 1.1 mg/dL — ABNORMAL HIGH (ref 0.57–1.11)
Lab: 10 mg/dL — ABNORMAL HIGH (ref 8.4–10.2)
Lab: 140 mmol/L — ABNORMAL LOW (ref 136–145)
Lab: 161 U/L — ABNORMAL HIGH (ref 40–150)
Lab: 30 U/L (ref >60)
Lab: 6.6 g/dL (ref 6.2–8.1)

## 2019-03-13 LAB — IRON + BINDING CAPACITY + %SAT+ FERRITIN: Lab: 28 % — ABNORMAL HIGH (ref 0–14)

## 2019-03-13 LAB — PROTIME INR (PT): Lab: 12 s — ABNORMAL HIGH (ref 9.9–12.6)

## 2019-03-20 ENCOUNTER — Encounter: Admit: 2019-03-20 | Discharge: 2019-03-20 | Payer: MEDICARE

## 2019-03-27 ENCOUNTER — Encounter: Admit: 2019-03-27 | Discharge: 2019-03-27 | Payer: MEDICARE

## 2019-03-27 DIAGNOSIS — K760 Fatty (change of) liver, not elsewhere classified: ICD-10-CM

## 2019-03-27 DIAGNOSIS — K7460 Unspecified cirrhosis of liver: ICD-10-CM

## 2019-03-27 DIAGNOSIS — K31819 Angiodysplasia of stomach and duodenum without bleeding: ICD-10-CM

## 2019-03-27 DIAGNOSIS — D500 Iron deficiency anemia secondary to blood loss (chronic): ICD-10-CM

## 2019-03-27 DIAGNOSIS — D509 Iron deficiency anemia, unspecified: Principal | ICD-10-CM

## 2019-03-27 LAB — CBC AND DIFF
Lab: 0.1
Lab: 162
Lab: 2.3 — ABNORMAL HIGH (ref 0.0–2.0)
Lab: 2.5
Lab: 21
Lab: 27
Lab: 31 — ABNORMAL LOW (ref 37.0–47.0)
Lab: 9.4
Lab: 0.1
Lab: 0.5
Lab: 1.1
Lab: 25 — ABNORMAL HIGH (ref 11.5–14.5)
Lab: 3.3
Lab: 3.5 mL/min — ABNORMAL LOW (ref >60)
Lab: 30
Lab: 5 mL/min — ABNORMAL LOW (ref >60)
Lab: 64
Lab: 9.7 K/UL — ABNORMAL LOW (ref 12.0–16.0)
Lab: 90

## 2019-03-27 LAB — COMPREHENSIVE METABOLIC PANEL
Lab: 26
Lab: 3.2 — ABNORMAL LOW (ref 3.4–4.8)
Lab: 4
Lab: 1 mg/dL
Lab: 1.4 mg/dL — ABNORMAL HIGH (ref 0.2–1.2)
Lab: 105 mmol/L
Lab: 117 mg/dL — ABNORMAL HIGH (ref 70–105)
Lab: 13 meq/L
Lab: 140 mmol/L
Lab: 160 U/L — ABNORMAL HIGH (ref 40–150)
Lab: 19 mg/dL
Lab: 30 U/L
Lab: 47 — ABNORMAL HIGH (ref 5–34)
Lab: 54 mL/min/{1.73_m2} — ABNORMAL LOW (ref >59)
Lab: 6.4 g/dL
Lab: 9.6

## 2019-03-27 LAB — PROTIME INR (PT)
Lab: 1.2
Lab: 13 s — ABNORMAL HIGH (ref 9.9–12.6)

## 2019-04-01 ENCOUNTER — Ambulatory Visit: Admit: 2019-04-01 | Discharge: 2019-04-02 | Payer: MEDICARE

## 2019-04-01 ENCOUNTER — Ambulatory Visit: Admit: 2019-04-01 | Discharge: 2019-04-01 | Payer: MEDICARE

## 2019-04-01 ENCOUNTER — Encounter: Admit: 2019-04-01 | Discharge: 2019-04-01 | Payer: MEDICARE

## 2019-04-01 DIAGNOSIS — Z01810 Encounter for preprocedural cardiovascular examination: ICD-10-CM

## 2019-04-01 DIAGNOSIS — R0600 Dyspnea, unspecified: ICD-10-CM

## 2019-04-01 DIAGNOSIS — I10 Essential (primary) hypertension: Secondary | ICD-10-CM

## 2019-04-01 DIAGNOSIS — J329 Chronic sinusitis, unspecified: ICD-10-CM

## 2019-04-01 DIAGNOSIS — K7460 Unspecified cirrhosis of liver: ICD-10-CM

## 2019-04-01 DIAGNOSIS — E119 Type 2 diabetes mellitus without complications: ICD-10-CM

## 2019-04-01 DIAGNOSIS — K5730 Diverticulosis of large intestine without perforation or abscess without bleeding: ICD-10-CM

## 2019-04-01 DIAGNOSIS — K760 Fatty (change of) liver, not elsewhere classified: Principal | ICD-10-CM

## 2019-04-01 DIAGNOSIS — E669 Obesity, unspecified: ICD-10-CM

## 2019-04-01 LAB — PROTIME INR (PT): Lab: 1.1 (ref 0.8–1.2)

## 2019-04-01 LAB — CBC AND DIFF
Lab: 0.1 10*3/uL (ref 0–0.20)
Lab: 0.1 10*3/uL (ref 0–0.45)
Lab: 0.5 10*3/uL (ref 0–0.80)
Lab: 1 10*3/uL (ref 1.0–4.8)
Lab: 11 % (ref 4–12)
Lab: 175 10*3/uL — ABNORMAL HIGH (ref 150–400)
Lab: 19 % — ABNORMAL LOW (ref 24–44)
Lab: 2 % (ref >60)
Lab: 27 % — ABNORMAL HIGH (ref 11–15)
Lab: 28 pg (ref 26–34)
Lab: 3 % — ABNORMAL LOW (ref >60)
Lab: 3.4 M/UL — ABNORMAL LOW (ref 4.0–5.0)
Lab: 3.6 10*3/uL (ref 1.8–7.0)
Lab: 30 % — ABNORMAL LOW (ref 36–45)
Lab: 32 g/dL — ABNORMAL HIGH (ref 32.0–36.0)
Lab: 5.6 10*3/uL (ref 4.5–11.0)
Lab: 8.2 FL — ABNORMAL HIGH (ref 7–11)
Lab: 87 FL (ref 80–100)
Lab: 9.9 g/dL — ABNORMAL LOW (ref 12.0–15.0)

## 2019-04-01 LAB — COMPREHENSIVE METABOLIC PANEL
Lab: 141 MMOL/L (ref 137–147)
Lab: 4.2 MMOL/L (ref 3.5–5.1)

## 2019-04-01 LAB — CA19.9: Lab: 62 U/mL — ABNORMAL HIGH (ref <35)

## 2019-04-01 LAB — ALPHA FETO PROTEIN (AFP): Lab: 7 ng/mL (ref 0.0–15.0)

## 2019-04-01 MED ORDER — METHYLPREDNISOLONE 32 MG PO TAB
32 mg | ORAL_TABLET | ORAL | 0 refills | 6.00000 days | Status: DC
Start: 2019-04-01 — End: 2019-06-19

## 2019-04-01 MED ORDER — FUROSEMIDE 40 MG PO TAB
80 mg | ORAL_TABLET | Freq: Every day | ORAL | 3 refills | 90.00000 days | Status: DC
Start: 2019-04-01 — End: 2019-07-28

## 2019-04-01 MED ORDER — PANTOPRAZOLE 40 MG PO TBEC
40 mg | ORAL_TABLET | Freq: Two times a day (BID) | ORAL | 3 refills | 90.00000 days | Status: DC
Start: 2019-04-01 — End: 2019-06-29

## 2019-04-01 MED ORDER — DIPHENHYDRAMINE HCL 25 MG PO TAB
50 mg | ORAL_TABLET | Freq: Once | ORAL | 0 refills | 6.00000 days | Status: AC
Start: 2019-04-01 — End: ?

## 2019-04-02 DIAGNOSIS — M21379 Foot drop, unspecified foot: ICD-10-CM

## 2019-04-02 DIAGNOSIS — I728 Aneurysm of other specified arteries: Secondary | ICD-10-CM

## 2019-04-02 DIAGNOSIS — K760 Fatty (change of) liver, not elsewhere classified: Principal | ICD-10-CM

## 2019-04-02 DIAGNOSIS — R978 Other abnormal tumor markers: ICD-10-CM

## 2019-04-02 DIAGNOSIS — K7469 Other cirrhosis of liver: ICD-10-CM

## 2019-04-02 DIAGNOSIS — R1030 Lower abdominal pain, unspecified: Secondary | ICD-10-CM

## 2019-04-02 DIAGNOSIS — R200 Anesthesia of skin: ICD-10-CM

## 2019-04-02 DIAGNOSIS — K31819 Angiodysplasia of stomach and duodenum without bleeding: ICD-10-CM

## 2019-04-02 DIAGNOSIS — K2210 Ulcer of esophagus without bleeding: Secondary | ICD-10-CM

## 2019-04-05 ENCOUNTER — Encounter: Admit: 2019-04-05 | Discharge: 2019-04-05 | Payer: MEDICARE

## 2019-04-05 DIAGNOSIS — E669 Obesity, unspecified: ICD-10-CM

## 2019-04-05 DIAGNOSIS — K7460 Unspecified cirrhosis of liver: ICD-10-CM

## 2019-04-05 DIAGNOSIS — K5730 Diverticulosis of large intestine without perforation or abscess without bleeding: ICD-10-CM

## 2019-04-05 DIAGNOSIS — J329 Chronic sinusitis, unspecified: ICD-10-CM

## 2019-04-05 DIAGNOSIS — E119 Type 2 diabetes mellitus without complications: ICD-10-CM

## 2019-04-05 DIAGNOSIS — R0600 Dyspnea, unspecified: ICD-10-CM

## 2019-04-05 DIAGNOSIS — I10 Essential (primary) hypertension: Secondary | ICD-10-CM

## 2019-04-05 DIAGNOSIS — K760 Fatty (change of) liver, not elsewhere classified: Principal | ICD-10-CM

## 2019-04-05 DIAGNOSIS — Z01810 Encounter for preprocedural cardiovascular examination: ICD-10-CM

## 2019-04-06 ENCOUNTER — Encounter: Admit: 2019-04-06 | Discharge: 2019-04-06 | Payer: MEDICARE

## 2019-04-14 ENCOUNTER — Encounter: Admit: 2019-04-14 | Discharge: 2019-04-14 | Payer: MEDICARE

## 2019-04-14 ENCOUNTER — Ambulatory Visit: Admit: 2019-04-14 | Discharge: 2019-04-15 | Payer: MEDICARE

## 2019-04-14 DIAGNOSIS — K760 Fatty (change of) liver, not elsewhere classified: Principal | ICD-10-CM

## 2019-04-14 DIAGNOSIS — I10 Essential (primary) hypertension: ICD-10-CM

## 2019-04-14 DIAGNOSIS — K5730 Diverticulosis of large intestine without perforation or abscess without bleeding: ICD-10-CM

## 2019-04-14 DIAGNOSIS — J329 Chronic sinusitis, unspecified: ICD-10-CM

## 2019-04-14 DIAGNOSIS — R0600 Dyspnea, unspecified: ICD-10-CM

## 2019-04-14 DIAGNOSIS — Z01810 Encounter for preprocedural cardiovascular examination: ICD-10-CM

## 2019-04-14 DIAGNOSIS — K7460 Unspecified cirrhosis of liver: ICD-10-CM

## 2019-04-14 DIAGNOSIS — I729 Aneurysm of unspecified site: ICD-10-CM

## 2019-04-14 DIAGNOSIS — E669 Obesity, unspecified: ICD-10-CM

## 2019-04-14 DIAGNOSIS — E119 Type 2 diabetes mellitus without complications: ICD-10-CM

## 2019-04-15 DIAGNOSIS — R200 Anesthesia of skin: ICD-10-CM

## 2019-04-15 DIAGNOSIS — R292 Abnormal reflex: Principal | ICD-10-CM

## 2019-04-15 DIAGNOSIS — M542 Cervicalgia: Secondary | ICD-10-CM

## 2019-04-15 DIAGNOSIS — R29898 Other symptoms and signs involving the musculoskeletal system: Secondary | ICD-10-CM

## 2019-04-15 DIAGNOSIS — K760 Fatty (change of) liver, not elsewhere classified: ICD-10-CM

## 2019-04-15 DIAGNOSIS — M21379 Foot drop, unspecified foot: ICD-10-CM

## 2019-04-23 ENCOUNTER — Encounter: Admit: 2019-04-23 | Discharge: 2019-04-23

## 2019-04-23 ENCOUNTER — Ambulatory Visit: Admit: 2019-04-23 | Discharge: 2019-04-24

## 2019-04-23 DIAGNOSIS — R292 Abnormal reflex: Principal | ICD-10-CM

## 2019-04-24 DIAGNOSIS — R29898 Other symptoms and signs involving the musculoskeletal system: Secondary | ICD-10-CM

## 2019-04-27 ENCOUNTER — Encounter: Admit: 2019-04-27 | Discharge: 2019-04-27

## 2019-04-27 DIAGNOSIS — R29818 Other symptoms and signs involving the nervous system: Principal | ICD-10-CM

## 2019-04-27 DIAGNOSIS — M542 Cervicalgia: Secondary | ICD-10-CM

## 2019-04-28 ENCOUNTER — Encounter: Admit: 2019-04-27 | Discharge: 2019-04-28

## 2019-04-28 DIAGNOSIS — R29898 Other symptoms and signs involving the musculoskeletal system: Secondary | ICD-10-CM

## 2019-04-28 DIAGNOSIS — R292 Abnormal reflex: Secondary | ICD-10-CM

## 2019-04-28 DIAGNOSIS — R531 Weakness: Secondary | ICD-10-CM

## 2019-04-28 DIAGNOSIS — M542 Cervicalgia: Secondary | ICD-10-CM

## 2019-04-28 DIAGNOSIS — R29818 Other symptoms and signs involving the nervous system: Secondary | ICD-10-CM

## 2019-05-11 ENCOUNTER — Encounter: Admit: 2019-05-11 | Discharge: 2019-05-11

## 2019-05-11 NOTE — Telephone Encounter
pt LVM asking for RN to CB.

## 2019-05-14 ENCOUNTER — Encounter: Admit: 2019-05-14 | Discharge: 2019-05-14

## 2019-05-14 ENCOUNTER — Ambulatory Visit: Admit: 2019-05-14 | Discharge: 2019-05-15

## 2019-05-14 DIAGNOSIS — R29898 Other symptoms and signs involving the musculoskeletal system: Principal | ICD-10-CM

## 2019-05-14 DIAGNOSIS — I728 Aneurysm of other specified arteries: Secondary | ICD-10-CM

## 2019-05-14 NOTE — Telephone Encounter
call returned 05/13/2019. 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 Patient v/u that IR will call to schedule consult, mapping procedure and stent placement over 3 visits.  All questions answered.

## 2019-05-14 NOTE — Progress Notes
Procedure Time Out Check List:    Prior to the start of the procedure, I personally confirmed the following:      Patient Identity (name & date of birth): Yes  Procedure: Yes  Site: Yes  Body Part: Yes    The risks of the procedure, including infection, bleeding, and pain, were discussed with the patient.    Pain was 0/10 before and after the procedure      Test was completed without complication.       , MD  Assistant Professor  Neurology/Neuromuscular Medicine

## 2019-05-15 ENCOUNTER — Encounter: Admit: 2019-05-15 | Discharge: 2019-05-15

## 2019-05-15 DIAGNOSIS — R292 Abnormal reflex: Secondary | ICD-10-CM

## 2019-05-15 DIAGNOSIS — Z1159 Encounter for screening for other viral diseases: Secondary | ICD-10-CM

## 2019-05-15 NOTE — Telephone Encounter
Phone call made to patient to discuss date for diagnostic arteriogram scheduled 05/21/19. Patient verbalized understanding.  Will find place in [place] for patient to get COVID 19 testing and will send prescription to patient's pharmacy for pretreatment for contrast allergy.

## 2019-05-15 NOTE — Telephone Encounter
Received IR order to schedule patient for consult, planning arteriogram and possible celiac artery stent placement.  Discussed between Dr. Wynetta Emery and Dr. Lovena Le. Per. Dr. Wynetta Emery, case to be done by himself or Dr. Theda Sers. Will plan to schedule consult and then diagnostic arteriogram with 3D imaging to get good pictures of the anatomy to see if patient is candidate for a stent.    Phone call made to patient to schedule consult. Consult scheduled 05/20/19 1:00 with Dr. Theda Sers.  Instructions to be sent via My Chart. Patient provided with call back for this CNC with additional questions.

## 2019-05-15 NOTE — Progress Notes
Faxed PT home health order to [facility] - fax: [phone].  Confirmation received. Pt notified

## 2019-05-16 MED ORDER — PREDNISONE 50 MG PO TAB
50 mg | ORAL_TABLET | ORAL | 1 refills | 30.00000 days | Status: DC
Start: 2019-05-16 — End: 2019-06-19

## 2019-05-18 ENCOUNTER — Encounter: Admit: 2019-05-18 | Discharge: 2019-05-18

## 2019-05-18 DIAGNOSIS — I729 Aneurysm of unspecified site: Secondary | ICD-10-CM

## 2019-05-18 DIAGNOSIS — R0600 Dyspnea, unspecified: Secondary | ICD-10-CM

## 2019-05-18 DIAGNOSIS — E669 Obesity, unspecified: Secondary | ICD-10-CM

## 2019-05-18 DIAGNOSIS — K5730 Diverticulosis of large intestine without perforation or abscess without bleeding: Secondary | ICD-10-CM

## 2019-05-18 DIAGNOSIS — K7460 Unspecified cirrhosis of liver: Secondary | ICD-10-CM

## 2019-05-18 DIAGNOSIS — I10 Essential (primary) hypertension: Secondary | ICD-10-CM

## 2019-05-18 DIAGNOSIS — Z01810 Encounter for preprocedural cardiovascular examination: Secondary | ICD-10-CM

## 2019-05-18 DIAGNOSIS — K760 Fatty (change of) liver, not elsewhere classified: Secondary | ICD-10-CM

## 2019-05-18 DIAGNOSIS — J329 Chronic sinusitis, unspecified: Secondary | ICD-10-CM

## 2019-05-18 DIAGNOSIS — E119 Type 2 diabetes mellitus without complications: Secondary | ICD-10-CM

## 2019-05-18 NOTE — Progress Notes
My Chart instructions sent to patient for IR consult 05/20/19 and IR diagnostic arteriogram scheduled 05/21/19.  Patient unable to get COVID 19 swab in [place].  Hospital will not do and ordering through PCP takes 3-8 days for results. No appointments available at [facility] on 05/20/19 - day of consult.

## 2019-05-20 ENCOUNTER — Encounter: Admit: 2019-05-20 | Discharge: 2019-05-20

## 2019-05-20 ENCOUNTER — Ambulatory Visit: Admit: 2019-05-20 | Discharge: 2019-05-21

## 2019-05-20 ENCOUNTER — Ambulatory Visit: Admit: 2019-05-20 | Discharge: 2019-05-20

## 2019-05-20 DIAGNOSIS — K7290 Hepatic failure, unspecified without coma: Secondary | ICD-10-CM

## 2019-05-20 DIAGNOSIS — K760 Fatty (change of) liver, not elsewhere classified: Secondary | ICD-10-CM

## 2019-05-20 DIAGNOSIS — R0600 Dyspnea, unspecified: Secondary | ICD-10-CM

## 2019-05-20 DIAGNOSIS — I729 Aneurysm of unspecified site: Principal | ICD-10-CM

## 2019-05-20 DIAGNOSIS — J329 Chronic sinusitis, unspecified: Secondary | ICD-10-CM

## 2019-05-20 DIAGNOSIS — E119 Type 2 diabetes mellitus without complications: Secondary | ICD-10-CM

## 2019-05-20 DIAGNOSIS — K7460 Unspecified cirrhosis of liver: Secondary | ICD-10-CM

## 2019-05-20 DIAGNOSIS — Z01810 Encounter for preprocedural cardiovascular examination: Secondary | ICD-10-CM

## 2019-05-20 DIAGNOSIS — K5730 Diverticulosis of large intestine without perforation or abscess without bleeding: Secondary | ICD-10-CM

## 2019-05-20 DIAGNOSIS — E669 Obesity, unspecified: Secondary | ICD-10-CM

## 2019-05-20 DIAGNOSIS — I10 Essential (primary) hypertension: Secondary | ICD-10-CM

## 2019-05-20 DIAGNOSIS — D509 Iron deficiency anemia, unspecified: Secondary | ICD-10-CM

## 2019-05-20 DIAGNOSIS — Z1159 Encounter for screening for other viral diseases: Secondary | ICD-10-CM

## 2019-05-20 LAB — CBC AND DIFF
Lab: 0 10*3/uL (ref 0–0.20)
Lab: 0 10*3/uL (ref 0–0.45)
Lab: 0.6 10*3/uL (ref 0–0.80)
Lab: 1 % — ABNORMAL LOW (ref >60)
Lab: 1 % — ABNORMAL LOW (ref >60)
Lab: 1.2 10*3/uL (ref 1.0–4.8)
Lab: 14 % — ABNORMAL LOW (ref 11–15)
Lab: 18 % — ABNORMAL LOW (ref 24–44)
Lab: 188 K/UL — ABNORMAL HIGH (ref >60)
Lab: 27 % — ABNORMAL LOW (ref 36–45)
Lab: 29 pg (ref 26–34)
Lab: 3 M/UL — ABNORMAL LOW (ref 4.0–5.0)
Lab: 32 g/dL (ref 32.0–36.0)
Lab: 4.9 10*3/uL (ref 1.8–7.0)
Lab: 6.9 K/UL (ref 4.5–11.0)
Lab: 71 % (ref 41–77)
Lab: 9 % (ref 4–12)
Lab: 9.3 FL (ref >60)
Lab: 90 FL (ref 80–100)

## 2019-05-20 LAB — IRON, TOTAL SERUM: Lab: 116 ug/dL — ABNORMAL LOW (ref 50–160)

## 2019-05-20 LAB — PROTIME INR (PT): Lab: 1.2 mg/dL (ref 0.8–1.2)

## 2019-05-20 LAB — COMPREHENSIVE METABOLIC PANEL
Lab: 141 MMOL/L — ABNORMAL HIGH (ref 137–147)
Lab: 3.7 MMOL/L (ref 3.5–5.1)

## 2019-05-20 NOTE — Progress Notes
Date of Service: 05/20/2019              Chief Complaint   Patient presents with   ??? 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 Earle Gell.  Patient was being worked up for liver transplant and had imaging performed demonstrating a large 2 cm celiac axis aneurysm.  Patient is referred to interventional radiology for intervention.  Patient's comorbidities include diabetes, gastroesophageal reflux disease, encephalopathy, bleeding varices.         Medical History:   Diagnosis Date   ??? Aneurysm (HCC)    ??? Cirrhosis of liver (HCC)     decompensated liver failure   ??? Diverticulosis of colon     descending and sigmoid colon   ??? Dyspnea    ??? Essential hypertension 08/13/2012   ??? Fatty infiltration of liver    ??? HTN (hypertension)    ??? Obesity (BMI 30-39.9) 05/13/2011   ??? Preop cardiovascular exam 01/07/2019   ??? Sinus infection    ??? Type 2 diabetes mellitus (HCC) 09/21/2014       Surgical History:   Procedure Laterality Date   ??? HYSTERECTOMY  1994   ??? UPPER GASTROINTESTINAL ENDOSCOPY  2009   ??? COLONOSCOPY  2009   ??? CYSTOCELE REPAIR  2009    with endocele repair   ??? RECTOCELE REPAIR  03/2009   ??? LIVER BIOPSY  07/17/2010   ??? COLONOSCOPY N/A 11/08/2017    Performed by Dawna Part, MD at [facility] ENDO   ??? ESOPHAGOGASTRODUODENOSCOPY N/A 11/08/2017    Performed by Dawna Part, MD at [facility] ENDO   ??? ESOPHAGOGASTRODUODENOSCOPY WITH BIOPSY - FLEXIBLE  11/08/2017    Performed by Dawna Part, MD at [facility] ENDO   ??? COLONOSCOPY WITH HOT BIOPSY FORCEPS REMOVAL TUMOR/ POLYP/ OTHER LESION  11/08/2017    Performed by Dawna Part, MD at [facility] ENDO   ??? ESOPHAGOGASTRODUODENOSCOPY WITH BAND LIGATION ESOPHAGEAL/ GASTRIC VARICES - FLEXIBLE N/A 04/10/2018    Performed by Normajean Baxter, MD at [facility] ENDO   ??? ESOPHAGOGASTRODUODENOSCOPY WITH BIOPSY - FLEXIBLE N/A 04/10/2018 Performed by Normajean Baxter, MD at [facility] ENDO   ??? ESOPHAGOGASTRODUODENOSCOPY WITH CONTROL OF BLEEDING - FLEXIBLE N/A 04/25/2018    Performed by Jolee Ewing, MD at [facility] ENDO   ??? ESOPHAGOGASTRODUODENOSCOPY WITH BIOPSY - FLEXIBLE with push enteroscopy N/A 08/28/2018    Performed by Celesta Gentile, MD at [facility] ENDO   ??? EGD N/A 10/27/2018    Performed by Eliott Nine, MD at [facility] ENDO   ??? ESOPHAGOGASTRODUODENOSCOPY WITH SNARE REMOVAL TUMOR/ POLYP/ OTHER LESION - FLEXIBLE N/A 10/27/2018    Performed by Eliott Nine, MD at [facility] ENDO   ??? ESOPHAGOGASTRODUODENOSCOPY WITH BIOPSY - FLEXIBLE N/A 12/16/2018    Performed by Buckles, Vinnie Level, MD at [facility] ENDO   ??? ESOPHAGOGASTRODUODENOSCOPY WITH CONTROL OF BLEEDING - FLEXIBLE N/A 12/16/2018    Performed by Buckles, Vinnie Level, MD at [facility] ENDO   ??? ESOPHAGOGASTRODUODENOSCOPY WITH SPECIMEN COLLECTION BY BRUSHING/ WASHING N/A 01/22/2019    Performed by Veneta Penton, MD at [facility] ENDO   ??? ESOPHAGOGASTRODUODENOSCOPY WITH DILATION ESOPHAGUS WITH BALLOON 30 MM OR GREATER - FLEXIBLE N/A 01/22/2019    Performed by Veneta Penton, MD at [facility] ENDO   ??? ESOPHAGOGASTRODUODENOSCOPY WITH BIOPSY -  FLEXIBLE N/A 01/22/2019    Performed by Veneta Penton, MD at [facility] ENDO   ??? CHOLECYSTECTOMY     ??? COLONOSCOPY     ??? ESOPHAGOGASTRIC FUNDOPLICATION  2003, 2004    laparoscopic   ??? PR ESOPHAGOSCOPY FLEXIBLE TRANSORAL DIAGNOSTIC         Allergies:  Allergies   Allergen Reactions   ??? Adhesive Tape (Rosins) RASH   ??? Contrast Dye Iv, Iodine Containing [Iodinated Contrast Media] RASH   ??? Sulfa (Sulfonamide Antibiotics) RASH   ??? Codeine NAUSEA AND VOMITING   ??? Morphine SEE COMMENTS     Pt reports burns her veins   ??? Penicillin G SEE COMMENTS     Throat issues, blisters in throat    ??? Tramadol NAUSEA ONLY and ITCHING     Takes this medication regularly        Medication List:  ??? atorvastatin (LIPITOR) 10 mg tablet TAKE 1 TABLET BY MOUTH EVERY DAY   ??? duloxetine DR (CYMBALTA) 60 mg capsule Take 60 mg by mouth daily. ??? ferrous sulfate (FEOSOL) 325 mg (65 mg iron) tablet Take one tablet by mouth twice daily. Take on an empty stomach at least 1 hour before or 2 hours after food.   ??? furosemide (LASIX) 40 mg tablet Take two tablets by mouth daily.   ??? methylprednisolone (MEDROL) 32 mg tablet Take one tablet by mouth as directed. Take 32mg  by mouth 12 hours before appointment, then take 32mg  by mouth 2 hours before appointment time   ??? pantoprazole DR (PROTONIX) 40 mg tablet Take one tablet by mouth twice daily.   ??? polyethylene glycol 3350 (MIRALAX) 17 g packet Take one packet by mouth twice daily.   ??? potassium chloride SR (K-DUR) 20 mEq tablet Take one tablet by mouth daily. Take with a meal and a full glass of water.   ??? predniSONE (DELTASONE) 50 mg tablet Take one tablet by mouth as directed for 3 doses. Take 50 mg by mouth 13 hours (9PM July 1st), 7 hours (3AM) and 1 hour (9AM) prior to procedure with contrast dye on July 2 @ 10:00AM   ??? promethazine (PHENERGAN) 25 mg tablet Take 25 mg by mouth every 6 hours as needed.   ??? rifAXIMin (XIFAXAN) 550 mg tablet Take one tablet by mouth twice daily.   ??? spironolactone (ALDACTONE) 50 mg tablet Take one tablet by mouth daily. Take with food.   ??? vitamin A 10,000 unit capsule Take one capsule by mouth daily.   ??? zinc sulfate 220 mg (50 mg elemental zinc) capsule Take one capsule by mouth daily.       Social History:   reports that she has never smoked. She has never used smokeless tobacco. She reports that she does not drink alcohol or use drugs.    Family History   Problem Relation Age of Onset   ??? Other Mother    ??? Kidney Failure Mother    ??? Cancer Father         esophageal   ??? Liver Disease Sister         endstage, s/p liver transplant   ??? Cancer Brother         tonsil       Review of Systems   Constitutional: Negative.    HENT: Negative.    Eyes: Negative.    Respiratory: Negative.    Cardiovascular: Positive for palpitations and leg swelling.   Gastrointestinal: Negative. Endocrine: Negative.    Genitourinary: Positive  for frequency.   Musculoskeletal: Negative.    Skin: Negative.    Allergic/Immunologic: Negative.    Neurological: Negative.    Hematological: Bruises/bleeds easily.   Psychiatric/Behavioral: Negative.                Vitals:    05/20/19 1253   BP: 114/53   BP Source: Arm, Left Upper   Patient Position: Sitting   Pulse: 90   Weight: 64 kg (141 lb)   PainSc: Zero     Body mass index is 24.98 kg/m???.     Physical Exam  HENT:      Head: Normocephalic.   Eyes:      Pupils: Pupils are equal, round, and reactive to light.   Neck:      Musculoskeletal: Normal range of motion.   Cardiovascular:      Rate and Rhythm: Normal rate and regular rhythm.      Pulses:           Dorsalis pedis pulses are 2+ on the right side and 2+ on the left side.        Posterior tibial pulses are 2+ on the right side and 2+ on the left side.   Pulmonary:      Effort: Pulmonary effort is normal.      Breath sounds: Normal breath sounds.   Abdominal:      General: Bowel sounds are normal.      Palpations: Abdomen is soft.         Cross-sectional imaging was reviewed.  My interpretation of a CT of the abdomen with contrast dated January 07, 2019 demonstrates a large aneurysm at the base of the celiac axis.  This measures up to 2.1 cm in transverse diameter and has slightly grown since the previous exam of May 2019.  This has a somewhat narrow neck and does not extend to the hepatic or splenic arteries.    Assessment and Plan:    No diagnosis found.     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  It is felt that a diagnostic arteriogram will be necessary to properly evaluate the anatomy prior to proceeding with intervention.  This will be scheduled tomorrow.    The risks to the procedure include pain, bleeding, infection, renal/liver failure, treatment failure, need for repeat therapy.    Thank you for allowing me to participate in this patient's care,    Maricela Curet MD

## 2019-05-21 ENCOUNTER — Encounter: Admit: 2019-05-21 | Discharge: 2019-05-21

## 2019-05-21 ENCOUNTER — Ambulatory Visit: Admit: 2019-05-21 | Discharge: 2019-05-21

## 2019-05-21 DIAGNOSIS — E669 Obesity, unspecified: Secondary | ICD-10-CM

## 2019-05-21 DIAGNOSIS — K5730 Diverticulosis of large intestine without perforation or abscess without bleeding: Secondary | ICD-10-CM

## 2019-05-21 DIAGNOSIS — J329 Chronic sinusitis, unspecified: Secondary | ICD-10-CM

## 2019-05-21 DIAGNOSIS — Z885 Allergy status to narcotic agent status: Secondary | ICD-10-CM

## 2019-05-21 DIAGNOSIS — K7290 Hepatic failure, unspecified without coma: Secondary | ICD-10-CM

## 2019-05-21 DIAGNOSIS — I10 Essential (primary) hypertension: Secondary | ICD-10-CM

## 2019-05-21 DIAGNOSIS — Z880 Allergy status to penicillin: Secondary | ICD-10-CM

## 2019-05-21 DIAGNOSIS — I729 Aneurysm of unspecified site: Principal | ICD-10-CM

## 2019-05-21 DIAGNOSIS — I728 Aneurysm of other specified arteries: Principal | ICD-10-CM

## 2019-05-21 DIAGNOSIS — K7460 Unspecified cirrhosis of liver: Secondary | ICD-10-CM

## 2019-05-21 DIAGNOSIS — Z882 Allergy status to sulfonamides status: Secondary | ICD-10-CM

## 2019-05-21 DIAGNOSIS — K760 Fatty (change of) liver, not elsewhere classified: Secondary | ICD-10-CM

## 2019-05-21 DIAGNOSIS — D509 Iron deficiency anemia, unspecified: Secondary | ICD-10-CM

## 2019-05-21 DIAGNOSIS — E119 Type 2 diabetes mellitus without complications: Secondary | ICD-10-CM

## 2019-05-21 DIAGNOSIS — R0600 Dyspnea, unspecified: Secondary | ICD-10-CM

## 2019-05-21 DIAGNOSIS — Z01810 Encounter for preprocedural cardiovascular examination: Secondary | ICD-10-CM

## 2019-05-21 DIAGNOSIS — Z888 Allergy status to other drugs, medicaments and biological substances status: Secondary | ICD-10-CM

## 2019-05-21 MED ORDER — FLUMAZENIL 0.1 MG/ML IV SOLN
.2 mg | INTRAVENOUS | 0 refills | Status: DC | PRN
Start: 2019-05-21 — End: 2019-05-21

## 2019-05-21 MED ORDER — NALOXONE 0.4 MG/ML IJ SOLN
.08 mg | INTRAVENOUS | 0 refills | Status: DC | PRN
Start: 2019-05-21 — End: 2019-05-21

## 2019-05-21 MED ORDER — MIDAZOLAM 1 MG/ML IJ SOLN
1 mg | Freq: Once | INTRAVENOUS | 0 refills | Status: CP
Start: 2019-05-21 — End: ?
  Administered 2019-05-21: 15:00:00 1 mg via INTRAVENOUS

## 2019-05-21 MED ORDER — MIDAZOLAM 1 MG/ML IJ SOLN
0 refills | Status: CP
Start: 2019-05-21 — End: ?
  Administered 2019-05-21: 15:00:00 1 mg via INTRAVENOUS
  Administered 2019-05-21 (×4): 0.5 mg via INTRAVENOUS

## 2019-05-21 MED ORDER — IOHEXOL 300 MG IODINE/ML IV SOLN
120 mL | Freq: Once | INTRA_ARTERIAL | 0 refills | Status: CP
Start: 2019-05-21 — End: ?
  Administered 2019-05-21: 17:00:00 120 mL via INTRA_ARTERIAL

## 2019-05-21 MED ORDER — FENTANYL CITRATE (PF) 50 MCG/ML IJ SOLN
0 refills | Status: CP
Start: 2019-05-21 — End: ?
  Administered 2019-05-21 (×2): 50 ug via INTRAVENOUS
  Administered 2019-05-21 (×2): 25 ug via INTRAVENOUS

## 2019-05-21 MED ORDER — SODIUM CHLORIDE 0.9 % IV SOLP
1000 mL | Freq: Once | INTRAVENOUS | 0 refills | Status: DC
Start: 2019-05-21 — End: 2019-05-21

## 2019-05-21 MED ORDER — SODIUM CHLORIDE 0.9 % IV SOLP
0 refills | Status: CP
Start: 2019-05-21 — End: ?
  Administered 2019-05-21: 15:00:00 500 mL via INTRAVENOUS

## 2019-05-21 NOTE — H&P (View-Only)
Pre Procedure History and Physical/Sedation Plan      Procedure Date:  05/21/2019    Planned Procedure(s): Diagnostic mesenteric arteriogram    Indication for exam: Celiac artery aneurysm  ________________________________________________________________    Chief Complaint:   Celiac artery aneurysm     Previous Anesthetic/Sedation History:  Reviewed    Allergies:  Adhesive tape (rosins); Contrast dye iv, iodine containing [iodinated contrast media]; Sulfa (sulfonamide antibiotics); Codeine; Morphine; Penicillin g; and Tramadol  Medications:  Scheduled Meds:Continuous Infusions:  PRN and Respiratory Meds:       Vital Signs:  Last Filed Vital Signs: 24 Hour Range   BP: 114/53 (07/01 1253)  Pulse: 90 (07/01 1253) BP: (114)/(53)   Pulse:  [90]      Pre-procedure anxiolysis plan: Midazolam  Sedation/Medication Plan: Fentanyl, Lidocaine and Midazolam  Personal history of sedation complications: Denies adverse event.   Family history of sedation complications: Denies adverse event.   Medications for Reversal: Naloxone and Flumazenil  Discussion/Reviews:  Physician has discussed risks and alternatives of this type of sedation and above planned procedures with patient    NPO Status: Acceptable  Airway:  airway assessment performed  Mallampati II (soft palate, uvula, fauces visible)  Head and Neck: no abnormalities noted  Mouth: no abnormalities noted   Anesthesia Classification:  ASA III (A patient with a severe systemic disease that limits activity, but is not incapacitating)  Pregnancy Status: Not Pregnant    Lab/Radiology/Other Diagnostic Tests  Labs:  Relevant labs reviewed    I have examined the patient, and there are no significant changes in their condition, from the previous H&P performed on 05/19/19.    Cay Schillings, APRN-NP  Pager [phone]

## 2019-05-21 NOTE — Progress Notes
Lonzo Cloud, MD present in the room.

## 2019-05-21 NOTE — Progress Notes
Criteria met for discharge. VSS. Site clean, dry, and intact. Reviewed discharge information and no further questions. Patient relative to drive home. Transported to front lobby for get in personal vehicle to go home.

## 2019-05-21 NOTE — Progress Notes
Dr Theda Sers in room discussing patient procedure, findings, and future plan with patient and husband at bedside.

## 2019-05-21 NOTE — Other
Immediate Post Procedure Note    Date:  05/21/2019                                         Attending Physician:   Arlie Solomons, MD  Performing Provider:  Rachel Bo, DO    Consent:  Consent obtained from patient.  Time out performed: Consent obtained, correct patient verified, correct procedure verified, correct site verified, patient marked as necessary.  Pre/Post Procedure Diagnosis:  2 CM CELIAC ARTERY ANEURYSM  Indications:  2 CM CELIAC ARTERY ANEURYSM    Anesthesia: Local 5 mL 1% lidocaine without epinephrine  Procedure(s):  MESENTERIC ANGIOGRAM  Findings:  2 CM CELIAC ARTERY ANEURYSM     Estimated Blood Loss:  Minimal  Specimen(s) Removed/Disposition:  None  Complications: None  Patient Tolerated Procedure: Well  Post-Procedure Condition:  stable     , DO  Pager

## 2019-05-21 NOTE — Patient Instructions
INTERVENTIONAL RADIOLOGY DISCHARGE INSTRUCTIONS  ARTERIOGRAM    An arteriogram or angiogram is a procedure in which a tiny catheter is inserted into an artery and fluoroscopy (x-rays) and contrast dye are used by the Interventional Radiologist to diagnose or treat certain conditions.??? Some examples of conditions that involve arteriography include narrowed or blocked arteries, arterial malformations, and arterial bleeding that may occur from trauma.  Arteriography can be used in many different parts of the body and may also be used during treatment of certain malignancies.???????????????????????????????????????????????????????????????????????????  This procedure is being done for you for the following reason: __________________________________.???  POST-PROCEDURE ACTIVITY:  ??? A responsible adult must drive you home after the procedure.  ??? If you receive sedation or anesthesia, do not drive, operate heavy machinery or do anything that requires concentration for at least 24 hours.???  ??? It is recommended that a responsible adult be with you until morning.???  ??? Avoid any exertion for one week.??? Exertion is lifting over 10 lbs., pushing, pulling or straining.???  ??? Avoid excessive bending, stooping, or stair climbing for 2 days.??? It is okay to go up stairs or bend over but take it slowly and keep it to a minimum.???  ??? You may be up and about while relaxing at home as you recover.  POST-PROCEDURE SITE CARE:  ??? You will have a bandage over the site.??? Keep this dry.??? You may remove it in 24 hours.  ??? You may shower in 24 hours after removing the bandage.??? Wash and dry the site gently.  ??? Do not submerge the site underwater for one week (no tub bath, swimming, hot tub, etc.)  ??? Do not use ointments, creams or powders on the puncture site.  ??? Be sure your hands are clean when touching near the site.  ??? Inspect the site daily.  DIET/MEDICATIONS:  ??? You may resume your previous diet after the procedure.  ??? If you receive sedation or narcotic pain medications, avoid any foods or beverages containing alcohol for at least 24 hours.  ??? Please see the Medication Reconciliation sheet for instructions regarding resuming your home medications.  WHEN TO CALL THE DOCTOR:????????????????????????  ??? If you have significant bleeding (more than a teaspoon) or swelling at the site (bigger than a golf ball), lie down, apply firm pressure to the site and call 911. Bleeding from a large vessel requires professional help.  ??? If you have signs of infection such as: Chills, body aches, fever greater than 101F, redness, swelling or warmth at the puncture site.???  ??? If you have???severe pain unrelieved by medication.??? It is common to have mild soreness or slight swelling at the puncture site for 1-2 weeks.  ??? If you have numbness, tingling, or weakness in the leg below the puncture site or your leg becomes cold and pale.  ??? If you have persistent nausea or vomiting.  You or your caregiver should call 911 for severe symptoms such as excessive bleeding, chest pain, shortness of breath or loss of consciousness.  For any of the above symptoms or for problems or concerns related to the procedure,??????call [phone] for Monday-Friday 7-5.??? After-hours and weekends, please call??????[phone] and ask for the Interventional Radiology Resident on-call.

## 2019-05-21 NOTE — Progress Notes
Sedation physician present in room.  Recent vitals and patient condition reviewed between sedating physician and nurse.  Reassessment completed.  Determination made to proceed with planned sedation.

## 2019-05-27 ENCOUNTER — Encounter: Admit: 2019-05-27 | Discharge: 2019-05-27

## 2019-05-27 DIAGNOSIS — Z1159 Encounter for screening for other viral diseases: Secondary | ICD-10-CM

## 2019-05-27 DIAGNOSIS — I728 Aneurysm of other specified arteries: Secondary | ICD-10-CM

## 2019-06-03 DIAGNOSIS — K2210 Ulcer of esophagus without bleeding: Secondary | ICD-10-CM

## 2019-06-03 DIAGNOSIS — Z48815 Encounter for surgical aftercare following surgery on the digestive system: Secondary | ICD-10-CM

## 2019-06-06 DIAGNOSIS — Z1159 Encounter for screening for other viral diseases: Principal | ICD-10-CM

## 2019-06-06 NOTE — Progress Notes
Patient arrived to [place] clinic for COVID-19 testing 06/06/19 0958. Patient identity confirmed via photo I.D. Nasopharyngeal procedure explained to the patient.   Nasopharyngeal swab completed right  Patient education provided given and instructed patient self isolate until contacted w/ results and further instructions.   Swab collected by [facility].    Date symptoms began/reason for testing: IR pre procedure 06/09/19

## 2019-06-07 ENCOUNTER — Encounter: Admit: 2019-06-07 | Discharge: 2019-06-07

## 2019-06-07 ENCOUNTER — Ambulatory Visit: Admit: 2019-06-06 | Discharge: 2019-06-07

## 2019-06-07 LAB — COVID-19 (SARS-COV-2) PCR

## 2019-06-09 ENCOUNTER — Ambulatory Visit: Admit: 2019-06-09 | Discharge: 2019-06-09

## 2019-06-09 ENCOUNTER — Encounter: Admit: 2019-06-09 | Discharge: 2019-06-09

## 2019-06-09 DIAGNOSIS — J329 Chronic sinusitis, unspecified: Secondary | ICD-10-CM

## 2019-06-09 DIAGNOSIS — R0600 Dyspnea, unspecified: Secondary | ICD-10-CM

## 2019-06-09 DIAGNOSIS — Z888 Allergy status to other drugs, medicaments and biological substances status: Secondary | ICD-10-CM

## 2019-06-09 DIAGNOSIS — D509 Iron deficiency anemia, unspecified: Secondary | ICD-10-CM

## 2019-06-09 DIAGNOSIS — K7460 Unspecified cirrhosis of liver: Secondary | ICD-10-CM

## 2019-06-09 DIAGNOSIS — K760 Fatty (change of) liver, not elsewhere classified: Secondary | ICD-10-CM

## 2019-06-09 DIAGNOSIS — I10 Essential (primary) hypertension: Secondary | ICD-10-CM

## 2019-06-09 DIAGNOSIS — Z01810 Encounter for preprocedural cardiovascular examination: Secondary | ICD-10-CM

## 2019-06-09 DIAGNOSIS — K5730 Diverticulosis of large intestine without perforation or abscess without bleeding: Secondary | ICD-10-CM

## 2019-06-09 DIAGNOSIS — Z885 Allergy status to narcotic agent status: Secondary | ICD-10-CM

## 2019-06-09 DIAGNOSIS — E872 Acidosis: Secondary | ICD-10-CM

## 2019-06-09 DIAGNOSIS — K31819 Angiodysplasia of stomach and duodenum without bleeding: Secondary | ICD-10-CM

## 2019-06-09 DIAGNOSIS — I8510 Secondary esophageal varices without bleeding: Secondary | ICD-10-CM

## 2019-06-09 DIAGNOSIS — K766 Portal hypertension: Secondary | ICD-10-CM

## 2019-06-09 DIAGNOSIS — E119 Type 2 diabetes mellitus without complications: Secondary | ICD-10-CM

## 2019-06-09 DIAGNOSIS — Z882 Allergy status to sulfonamides status: Secondary | ICD-10-CM

## 2019-06-09 DIAGNOSIS — R188 Other ascites: Secondary | ICD-10-CM

## 2019-06-09 DIAGNOSIS — Z8719 Personal history of other diseases of the digestive system: Secondary | ICD-10-CM

## 2019-06-09 DIAGNOSIS — E669 Obesity, unspecified: Secondary | ICD-10-CM

## 2019-06-09 DIAGNOSIS — Z79899 Other long term (current) drug therapy: Secondary | ICD-10-CM

## 2019-06-09 DIAGNOSIS — Z880 Allergy status to penicillin: Secondary | ICD-10-CM

## 2019-06-09 DIAGNOSIS — I729 Aneurysm of unspecified site: Secondary | ICD-10-CM

## 2019-06-09 DIAGNOSIS — I728 Aneurysm of other specified arteries: Secondary | ICD-10-CM

## 2019-06-09 DIAGNOSIS — D62 Acute posthemorrhagic anemia: Secondary | ICD-10-CM

## 2019-06-09 LAB — POC GLUCOSE: Lab: 167 mg/dL — ABNORMAL HIGH (ref 70–100)

## 2019-06-09 MED ORDER — MIDAZOLAM 1 MG/ML IJ SOLN
1 mg | Freq: Once | INTRAVENOUS | 0 refills | Status: CP
Start: 2019-06-09 — End: ?
  Administered 2019-06-09: 14:00:00 1 mg via INTRAVENOUS

## 2019-06-09 MED ORDER — IOHEXOL 300 MG IODINE/ML IV SOLN
40 mL | Freq: Once | INTRA_ARTERIAL | 0 refills | Status: CP
Start: 2019-06-09 — End: ?
  Administered 2019-06-09: 15:00:00 40 mL via INTRA_ARTERIAL

## 2019-06-09 MED ORDER — FENTANYL CITRATE (PF) 50 MCG/ML IJ SOLN
0 refills | Status: CP
Start: 2019-06-09 — End: ?
  Administered 2019-06-09: 15:00:00 25 ug via INTRAVENOUS

## 2019-06-09 MED ORDER — MIDAZOLAM 1 MG/ML IJ SOLN
0 refills | Status: CP
Start: 2019-06-09 — End: ?
  Administered 2019-06-09: 15:00:00 1 mg via INTRAVENOUS
  Administered 2019-06-09: 15:00:00 0.5 mg via INTRAVENOUS

## 2019-06-09 NOTE — Procedures
Immediate Post Procedure Note    Date:  06/09/2019                                      Attending Physician:   Theda Sers  Assistant(s):  None    Procedure(s):  Celiac aneurysm coiling  Preprocedure/postprocedure diagnosis:  Celiac aneurysm  Description of procedure and procedural findings:  Large celiac root aneurysm, successfully coiled. See dictation in PACS and/or EPIC for full case discussion.   Anesthesia: Local 10 mL 1% lidocaine without epinephrine  Sedation/Medication Plan: Fentanyl and Midazolam    Time out performed: Consent obtained, correct patient verified, correct procedure verified, correct site verified, patient marked as necessary.  Estimated blood loss:  None/Negligible  Specimen(s) removed/disposition:  None  Complications: None      Lenna Gilford, MD

## 2019-06-09 NOTE — H&P (View-Only)
Pre-Procedure History and Physical/Sedation Plan    Procedure Date: 06/09/2019     Planned Procedure(s):  Celiac artery aneurysm embolization    Indication:  Celiac artery aneursym  __________________________________________________________________    Chief Complaint:  Celiac artery aneurysm     History of Present Illness: Kristin Moody is a 63 y.o. female with a history significant for celiac artery aneursym who presents today for procedure.    Patient Active Problem List    Diagnosis Date Noted   ??? Right leg weakness 05/14/2019   ??? 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 Iron (Fe) deficiency anemia 01/19/2019   ??? Preop cardiovascular exam 01/07/2019   ??? Pre-transplant evaluation for liver transplant 01/06/2019   ??? Cirrhosis (HCC) 12/16/2018   ??? Acute on chronic anemia 12/16/2018   ??? Lactic acidosis 12/16/2018   ??? GI bleed 10/24/2018   ??? Portal hypertension (HCC) 08/28/2018   ??? Confusion 08/27/2018   ??? Dyspnea 08/27/2018   ??? Chronic abdominal pain 08/27/2018   ??? 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 Acute on chronic blood loss anemia 08/27/2018   ??? Iron deficiency anemia 04/13/2018   ??? Pneumonia due to infectious organism 04/13/2018   ??? Melena 04/10/2018   ??? Esophageal varices without bleeding (HCC) 02/25/2018   ??? Cirrhosis of liver without ascites (HCC) 02/25/2018   ??? 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 Lower abdominal pain 10/24/2014   ??? Chest discomfort 09/21/2014   ??? Type 2 diabetes mellitus (HCC) 09/21/2014   ??? Essential hypertension 08/13/2012   ??? Abnormal liver function tests 05/13/2011   ??? 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 Obesity (BMI 30-39.9) 05/13/2011   ??? Slow transit constipation 05/13/2011     Medical History:   Diagnosis Date   ??? Aneurysm (HCC)    ??? Cirrhosis of liver (HCC)     decompensated liver failure   ??? Diverticulosis of colon     descending and sigmoid colon   ??? Dyspnea    ??? Essential hypertension 08/13/2012 ??? Fatty infiltration of liver    ??? HTN (hypertension)    ??? Obesity (BMI 30-39.9) 05/13/2011   ??? Preop cardiovascular exam 01/07/2019   ??? Sinus infection    ??? Type 2 diabetes mellitus (HCC) 09/21/2014      Surgical History:   Procedure Laterality Date   ??? HYSTERECTOMY  1994   ??? UPPER GASTROINTESTINAL ENDOSCOPY  2009   ??? COLONOSCOPY  2009   ??? CYSTOCELE REPAIR  2009    with endocele repair   ??? RECTOCELE REPAIR  03/2009   ??? LIVER BIOPSY  07/17/2010   ??? COLONOSCOPY N/A 11/08/2017    Performed by Dawna Part, MD at [facility] ENDO   ??? ESOPHAGOGASTRODUODENOSCOPY N/A 11/08/2017    Performed by Dawna Part, MD at [facility] ENDO   ??? ESOPHAGOGASTRODUODENOSCOPY WITH BIOPSY - FLEXIBLE  11/08/2017    Performed by Dawna Part, MD at [facility] ENDO   ??? COLONOSCOPY WITH HOT BIOPSY FORCEPS REMOVAL TUMOR/ POLYP/ OTHER LESION  11/08/2017    Performed by Dawna Part, MD at [facility] ENDO   ??? ESOPHAGOGASTRODUODENOSCOPY WITH BAND LIGATION ESOPHAGEAL/ GASTRIC VARICES - FLEXIBLE N/A 04/10/2018    Performed by Normajean Baxter, MD at [facility] ENDO   ??? ESOPHAGOGASTRODUODENOSCOPY WITH BIOPSY - FLEXIBLE N/A 04/10/2018    Performed by Normajean Baxter, MD at [facility] ENDO   ??? ESOPHAGOGASTRODUODENOSCOPY WITH CONTROL OF BLEEDING - FLEXIBLE N/A 04/25/2018  Performed by Jolee Ewing, MD at [facility] ENDO   ??? ESOPHAGOGASTRODUODENOSCOPY WITH BIOPSY - FLEXIBLE with push enteroscopy N/A 08/28/2018    Performed by Celesta Gentile, MD at [facility] ENDO   ??? EGD N/A 10/27/2018    Performed by Eliott Nine, MD at [facility] ENDO   ??? ESOPHAGOGASTRODUODENOSCOPY WITH SNARE REMOVAL TUMOR/ POLYP/ OTHER LESION - FLEXIBLE N/A 10/27/2018    Performed by Eliott Nine, MD at [facility] ENDO   ??? ESOPHAGOGASTRODUODENOSCOPY WITH BIOPSY - FLEXIBLE N/A 12/16/2018    Performed by Buckles, Vinnie Level, MD at [facility] ENDO   ??? ESOPHAGOGASTRODUODENOSCOPY WITH CONTROL OF BLEEDING - FLEXIBLE N/A 12/16/2018    Performed by Buckles, Vinnie Level, MD at [facility] ENDO   ??? ESOPHAGOGASTRODUODENOSCOPY WITH SPECIMEN COLLECTION BY BRUSHING/ WASHING N/A 01/22/2019 Performed by Veneta Penton, MD at [facility] ENDO   ??? ESOPHAGOGASTRODUODENOSCOPY WITH DILATION ESOPHAGUS WITH BALLOON 30 MM OR GREATER - FLEXIBLE N/A 01/22/2019    Performed by Veneta Penton, MD at [facility] ENDO   ??? ESOPHAGOGASTRODUODENOSCOPY WITH BIOPSY - FLEXIBLE N/A 01/22/2019    Performed by Veneta Penton, MD at [facility] ENDO   ??? CHOLECYSTECTOMY     ??? COLONOSCOPY     ??? ESOPHAGOGASTRIC FUNDOPLICATION  2003, 2004    laparoscopic   ??? PR ESOPHAGOSCOPY FLEXIBLE TRANSORAL DIAGNOSTIC        Medications Prior to Admission   Medication Sig Dispense Refill Last Dose   ??? atorvastatin (LIPITOR) 10 mg tablet TAKE 1 TABLET BY MOUTH EVERY DAY 90 tablet 3 06/08/2019   ??? duloxetine DR (CYMBALTA) 60 mg capsule Take 60 mg by mouth daily.   06/09/2019   ??? ferrous sulfate (FEOSOL) 325 mg (65 mg iron) tablet Take one tablet by mouth twice daily. Take on an empty stomach at least 1 hour before or 2 hours after food. 90 tablet 1 06/08/2019   ??? furosemide (LASIX) 40 mg tablet Take two tablets by mouth daily. 60 tablet 3 06/08/2019   ??? methylprednisolone (MEDROL) 32 mg tablet Take one tablet by mouth as directed. Take 32mg  by mouth 12 hours before appointment, then take 32mg  by mouth 2 hours before appointment time 2 tablet 0 06/09/2019   ??? pantoprazole DR (PROTONIX) 40 mg tablet Take one tablet by mouth twice daily. 60 tablet 3 06/08/2019   ??? polyethylene glycol 3350 (MIRALAX) 17 g packet Take one packet by mouth twice daily.   06/08/2019   ??? potassium chloride SR (K-DUR) 20 mEq tablet Take one tablet by mouth daily. Take with a meal and a full glass of water. 90 tablet 0 06/08/2019   ??? predniSONE (DELTASONE) 50 mg tablet Take one tablet by mouth as directed for 3 doses. Take 50 mg by mouth 13 hours (9PM July 1st), 7 hours (3AM) and 1 hour (9AM) prior to procedure with contrast dye on July 2 @ 10:00AM 3 tablet 1 06/09/2019   ??? promethazine (PHENERGAN) 25 mg tablet Take 25 mg by mouth every 6 hours as needed.   06/09/2019 ??? rifAXIMin (XIFAXAN) 550 mg tablet Take one tablet by mouth twice daily. 60 tablet 11 Unknown   ??? spironolactone (ALDACTONE) 50 mg tablet Take one tablet by mouth daily. Take with food. 90 tablet 0 06/08/2019   ??? vitamin A 10,000 unit capsule Take one capsule by mouth daily. 30 capsule 1 06/08/2019   ??? zinc sulfate 220 mg (50 mg elemental zinc) capsule Take one capsule by mouth daily. 90 capsule 3 06/08/2019     Allergies  Allergen Reactions   ??? Adhesive Tape (Rosins) RASH   ??? Contrast Dye Iv, Iodine Containing [Iodinated Contrast Media] RASH   ??? Sulfa (Sulfonamide Antibiotics) RASH   ??? Codeine NAUSEA AND VOMITING   ??? Morphine SEE COMMENTS     Pt reports burns her veins   ??? Penicillin G SEE COMMENTS     Throat issues, blisters in throat    ??? Tramadol NAUSEA ONLY and ITCHING     Takes this medication regularly        Social History:   Social History     Tobacco Use   ??? Smoking status: Never Smoker   ??? Smokeless tobacco: Never Used   Substance Use Topics   ??? Alcohol use: No      Family History   Problem Relation Age of Onset   ??? Other Mother    ??? Kidney Failure Mother    ??? Cancer Father         esophageal   ??? Liver Disease Sister         endstage, s/p liver transplant   ??? Cancer Brother         tonsil        Review of Systems  A comprehensive review of systems was negative.    Previous Personal Anesthetic/Sedation History:  Denies adverse events related to sedation/anesthesia.     Previous Family Anesthetic/Sedation History: Denies adverse events related to sedation/anesthesia.    Physical Exam:  Vital Signs: Last Filed In 24 Hours Vital Signs: 24 Hour Range   BP: 108/66 (07/21 0813)  Temp: (P) 36.8 ???C (98.2 ???F) (07/21 0813)  Pulse: 92 (07/21 0813)  Respirations: 25 PER MINUTE (07/21 0813)  SpO2: 100 % (07/21 0813)  SpO2 Pulse: 92 (07/21 0813) BP: (108)/(66)   Temp:  [36.8 ???C (98.2 ???F)]   Pulse:  [92]   Respirations:  [25 PER MINUTE]   SpO2:  [100 %]           General appearance: alert and no distress noted. Neurologic: Grossly normal.  Lungs: Non labored.  Heart: regular rate and rhythm      Airway: airway assessment performed  Mallampati II (soft palate, uvula, fauces visible)  Head and Neck: no abnormalities noted  Mouth: no abnormalities noted  NPO status: Acceptable  Pregnancy Status: Not Pregnant  Anesthesia Classification:  ASA III (A patient with a severe systemic disease that limits activity, but is not incapacitating)  Pre-operative anxiolysis Plan: Versed  Sedation/Medication Plan: Fentanyl, Lidocaine and Midazolam  Discussion/Reviews:  Physician has discussed risks and alternatives of this type of sedation and above planned procedures with patient    Lab/Radiology/Other Diagnostic Tests:  Labs:  Pertinent labs reviewed           Pollyann Kennedy, APRN-NP  Pager [phone]

## 2019-06-09 NOTE — Progress Notes
Sedation physician present in room.  Recent vitals and patient condition reviewed between sedating physician and nurse.  Reassessment completed.  Determination made to proceed with planned sedation.

## 2019-06-09 NOTE — Patient Instructions
INTERVENTIONAL RADIOLOGY DISCHARGE INSTRUCTIONS  ARTERIOGRAM    An arteriogram or angiogram is a procedure in which a tiny catheter is inserted into an artery and fluoroscopy (x-rays) and contrast dye are used by the Interventional Radiologist to diagnose or treat certain conditions.??? Some examples of conditions that involve arteriography include narrowed or blocked arteries, arterial malformations, and arterial bleeding that may occur from trauma.  Arteriography can be used in many different parts of the body and may also be used during treatment of certain malignancies.???????????????????????????????????????????????????????????????????????????  This procedure is being done for you for the following reason: __________________________________.???  POST-PROCEDURE ACTIVITY:  ??? A responsible adult must drive you home after the procedure.  ??? If you receive sedation or anesthesia, do not drive, operate heavy machinery or do anything that requires concentration for at least 24 hours.???  ??? It is recommended that a responsible adult be with you until morning.???  ??? Avoid any exertion for one week.??? Exertion is lifting over 10 lbs., pushing, pulling or straining.???  ??? Avoid excessive bending, stooping, or stair climbing for 2 days.??? It is okay to go up stairs or bend over but take it slowly and keep it to a minimum.???  ??? You may be up and about while relaxing at home as you recover.  POST-PROCEDURE SITE CARE:  ??? You will have a bandage over the site.??? Keep this dry.??? You may remove it in 24 hours.  ??? You may shower in 24 hours after removing the bandage.??? Wash and dry the site gently.  ??? Do not submerge the site underwater for one week (no tub bath, swimming, hot tub, etc.)  ??? Do not use ointments, creams or powders on the puncture site.  ??? Be sure your hands are clean when touching near the site.  ??? Inspect the site daily.  DIET/MEDICATIONS:  ??? You may resume your previous diet after the procedure.  ??? If you receive sedation or narcotic pain medications, avoid any foods or beverages containing alcohol for at least 24 hours.  ??? Please see the Medication Reconciliation sheet for instructions regarding resuming your home medications.  WHEN TO CALL THE DOCTOR:????????????????????????  ??? If you have significant bleeding (more than a teaspoon) or swelling at the site (bigger than a golf ball), lie down, apply firm pressure to the site and call 911. Bleeding from a large vessel requires professional help.  ??? If you have signs of infection such as: Chills, body aches, fever greater than 101F, redness, swelling or warmth at the puncture site.???  ??? If you have???severe pain unrelieved by medication.??? It is common to have mild soreness or slight swelling at the puncture site for 1-2 weeks.  ??? If you have numbness, tingling, or weakness in the leg below the puncture site or your leg becomes cold and pale.  ??? If you have persistent nausea or vomiting.  You or your caregiver should call 911 for severe symptoms such as excessive bleeding, chest pain, shortness of breath or loss of consciousness.  For any of the above symptoms or for problems or concerns related to the procedure,??????call [phone] for Monday-Friday 7-5.??? After-hours and weekends, please call??????[phone] and ask for the Interventional Radiology Resident on-call.

## 2019-06-11 ENCOUNTER — Encounter: Admit: 2019-06-11 | Discharge: 2019-06-11

## 2019-06-11 DIAGNOSIS — I728 Aneurysm of other specified arteries: Secondary | ICD-10-CM

## 2019-06-12 ENCOUNTER — Encounter: Admit: 2019-06-12 | Discharge: 2019-06-12

## 2019-06-12 DIAGNOSIS — Z1159 Encounter for screening for other viral diseases: Secondary | ICD-10-CM

## 2019-06-12 NOTE — Telephone Encounter
Patient left vm asking if she is to take her medicine before she comes for her procedure on Friday.

## 2019-06-12 NOTE — Progress Notes
Spoke with pt. Confirmed procedure date and time. Reviewed procedure/prep instructions, meds and need for a driver. Also discussed driver/visitor policy. She verbalized understanding. COVID Swab ordered and scheduled. Instructions sent to MyChart.    EGD (ESOPHAGOGASTRODUODENOSCOPY) PREP      Upper GI endoscopy allows healthcare providers to look directly into the beginning of your gastrointestinal(GI) tract.  The esophagus, stomach, and duodenum (first part of the small intestine) make up the upper GI tract.      5 Days Prior:  1. Check with your prescribing physician for instructions about stopping your blood thinner.  Examples of blood thinners are Aleve, Aspirin. Coumadin, Eliquis, Ibuprofen, Naproxen, Plavix, and Xarelto.  2. Do not give yourself a Lovenox injection the morning of the test. Lovenox injections may be taken as usual through the day before your test.    Day of Exam:  1. Do not eat or drink anything after midnight the night before your exam. However, if your exam is in the afternoon you may drink clear liquids only up until (4) hours before your scheduled procedure time.  After this, you should have nothing by mouth.  This includes GUM or CANDY.   a. Chewing tobacco must be stopped  (6) hours before your scheduled procedure.   b. If you have an early morning test, take ONLY your essential morning medications (heart, blood pressure, seizure, etc.) with a small sip of water.   c. You will be sedated for the procedure. A responsible adult must drive you home (no Benedetto Goad, taxis, or buses are permitted). If you do not have a driver we will be unable to do the test.   d. You will be here for (3-4) hours from arrival time.   e. You will not be able to return to work the same day.   f. Please bring a list of your current medications and the dosages with you.     The Procedure:  ? You will lie on the endoscopy table. Usually patients lie on the left side.  ? You will be monitored and given oxygen. ? You are given sedation (relaxing) medication through an intravenous (IV) line.  ? The healthcare provider will put the endoscope in your mouth and down your esophagus. It is thinner than most pieces of food that you swallow.  It will not affect your breathing. The medicine helps keep you from gagging.   ? Air is inserted to expand your GI tract. It can make you burp.  ? During the procedure, the healthcare provider can take biopsies (tissue samples), remove abnormalities such as polyps, or treat abnormalities though a variety of devices placed through the endoscope. You will not feel this.   ? The endoscope carries images of your upper GI tract to a video screen.  ? An adult must drive you home.     Due to upcoming scheduled procedure, the patient has been scheduled for COVID-19 Swab testing to be completed within 48 hours of scheduled procedure.     Appointment Information:  - Date: 06/17/2019  - Time: 1150  - [facility] [address], [place]    Patient Instructions:  ???When you arrive to the clinic for your appointment, pull up to the cones, call the number and someone will come out to collect the swab. You will not need to get out of your car for the appointment. You will receive details, frequently asked questions and answers, and a map to the swab clinic location in a MyChart  message or by email.    It is extremely important that you quarantine between getting tested and receiving the results. Please stay at home, wash your hands frequently, avoid touching your face, clean high-touch surfaces and maintain at least 6 feet of distance between yourself and others.    We will contact you when your test results are available. If your result is positive, we will call you. You will not be able to have surgery, and we'll help you obtain care for COVID-19. If your result is negative, your surgery will proceed, and we will notify you of scheduling details and next steps in MyChart.. Patient verbalized understanding. No further questions at this time.    Eustaquio Maize, RN

## 2019-06-16 NOTE — Telephone Encounter
call returned, patient advised no to take medication prior to EGD, per instructions via my chart, she v/u

## 2019-06-17 ENCOUNTER — Ambulatory Visit: Admit: 2019-06-17 | Discharge: 2019-06-18

## 2019-06-17 NOTE — Progress Notes
Patient arrived to [facility] for COVID-19 testing on 06/17/19 at 1143. Patient identity confirmed via photo I.D. Nasopharyngeal procedure explained to the patient.   Nasopharyngeal swab completed - right nare.  Patient education provided given and instructed patient self isolate until contacted w/ results and further instructions.   Swab collected by Frederico Hamman, RN.    Date symptoms began/reason for testing: preop clearance

## 2019-06-18 ENCOUNTER — Encounter: Admit: 2019-06-18 | Discharge: 2019-06-18

## 2019-06-18 DIAGNOSIS — Z01818 Encounter for other preprocedural examination: Principal | ICD-10-CM

## 2019-06-18 DIAGNOSIS — Z1159 Encounter for screening for other viral diseases: Secondary | ICD-10-CM

## 2019-06-18 LAB — COVID-19 (SARS-COV-2) PCR

## 2019-06-19 ENCOUNTER — Ambulatory Visit: Admit: 2019-06-19 | Discharge: 2019-06-19

## 2019-06-19 ENCOUNTER — Encounter: Admit: 2019-06-19 | Discharge: 2019-06-19

## 2019-06-19 DIAGNOSIS — Z48815 Encounter for surgical aftercare following surgery on the digestive system: Principal | ICD-10-CM

## 2019-06-19 DIAGNOSIS — K3189 Other diseases of stomach and duodenum: Secondary | ICD-10-CM

## 2019-06-19 DIAGNOSIS — K31819 Angiodysplasia of stomach and duodenum without bleeding: Secondary | ICD-10-CM

## 2019-06-19 DIAGNOSIS — K209 Esophagitis, unspecified: Secondary | ICD-10-CM

## 2019-06-19 DIAGNOSIS — Z8719 Personal history of other diseases of the digestive system: Secondary | ICD-10-CM

## 2019-06-19 DIAGNOSIS — D500 Iron deficiency anemia secondary to blood loss (chronic): Secondary | ICD-10-CM

## 2019-06-19 DIAGNOSIS — K766 Portal hypertension: Secondary | ICD-10-CM

## 2019-06-19 DIAGNOSIS — I729 Aneurysm of unspecified site: Secondary | ICD-10-CM

## 2019-06-19 DIAGNOSIS — K317 Polyp of stomach and duodenum: Secondary | ICD-10-CM

## 2019-06-19 DIAGNOSIS — R739 Hyperglycemia, unspecified: Secondary | ICD-10-CM

## 2019-06-19 DIAGNOSIS — E119 Type 2 diabetes mellitus without complications: Secondary | ICD-10-CM

## 2019-06-19 DIAGNOSIS — K31811 Angiodysplasia of stomach and duodenum with bleeding: Secondary | ICD-10-CM

## 2019-06-19 DIAGNOSIS — Z01810 Encounter for preprocedural cardiovascular examination: Secondary | ICD-10-CM

## 2019-06-19 DIAGNOSIS — I8510 Secondary esophageal varices without bleeding: Secondary | ICD-10-CM

## 2019-06-19 DIAGNOSIS — E669 Obesity, unspecified: Secondary | ICD-10-CM

## 2019-06-19 DIAGNOSIS — R0600 Dyspnea, unspecified: Secondary | ICD-10-CM

## 2019-06-19 DIAGNOSIS — I10 Essential (primary) hypertension: Secondary | ICD-10-CM

## 2019-06-19 DIAGNOSIS — J329 Chronic sinusitis, unspecified: Secondary | ICD-10-CM

## 2019-06-19 DIAGNOSIS — K5730 Diverticulosis of large intestine without perforation or abscess without bleeding: Secondary | ICD-10-CM

## 2019-06-19 DIAGNOSIS — K760 Fatty (change of) liver, not elsewhere classified: Secondary | ICD-10-CM

## 2019-06-19 DIAGNOSIS — K7460 Unspecified cirrhosis of liver: Secondary | ICD-10-CM

## 2019-06-19 LAB — POC GLUCOSE
Lab: 123 mg/dL — ABNORMAL HIGH (ref 70–100)
Lab: 127 mg/dL — ABNORMAL HIGH (ref >60)

## 2019-06-19 MED ORDER — LACTATED RINGERS IV SOLP
1000 mL | Freq: Once | INTRAVENOUS | 0 refills | Status: CP
Start: 2019-06-19 — End: ?
  Administered 2019-06-19: 14:00:00 1000 mL via INTRAVENOUS

## 2019-06-19 MED ORDER — LIDOCAINE (PF) 200 MG/10 ML (2 %) IJ SYRG
0 refills | Status: DC
Start: 2019-06-19 — End: 2019-06-19
  Administered 2019-06-19: 15:00:00 60 mg via INTRAVENOUS

## 2019-06-19 MED ORDER — LACTATED RINGERS IV SOLP
0 refills | Status: DC
Start: 2019-06-19 — End: 2019-06-19
  Administered 2019-06-19: 15:00:00 via INTRAVENOUS

## 2019-06-19 MED ORDER — PROPOFOL INJ 10 MG/ML IV VIAL
0 refills | Status: DC
Start: 2019-06-19 — End: 2019-06-19
  Administered 2019-06-19: 15:00:00 30 mg via INTRAVENOUS
  Administered 2019-06-19: 15:00:00 60 mg via INTRAVENOUS
  Administered 2019-06-19: 15:00:00 30 mg via INTRAVENOUS
  Administered 2019-06-19 (×2): 20 mg via INTRAVENOUS
  Administered 2019-06-19: 15:00:00 60 mg via INTRAVENOUS

## 2019-06-19 MED ORDER — PROPOFOL 10 MG/ML IV EMUL 20 ML (INFUSION)(AM)(OR)
INTRAVENOUS | 0 refills | Status: DC
Start: 2019-06-19 — End: 2019-06-19
  Administered 2019-06-19: 15:00:00 120 ug/kg/min via INTRAVENOUS

## 2019-06-19 NOTE — H&P (View-Only)
Pre Procedure History and Physical/Sedation Plan    Date of Procedure:  06/19/2019    Planned Procedure(s):  GI:  EGD  Sedation/Medication Plan: MAC  Discussion/Reviews:  Physician has discussed risks and alternatives of this type of sedation and above planned procedures with patient  ___________________________________________________________________  Chief Complaint:  Follow-up of esophagitis, anemia    History of Present Illness: Kristin Moody is a 63 y.o. female h/o cirrhosis, chronic anemia, s/p prior Nissen fundoplication here for follow-up EGD.  Last EGD 01/2019 with severe esophagitis.    Previous Anesthetic/Sedation History:  Denies difficulty    Nursing Medical History     Nursing Surgical History   ??? Hysterectomy Yes    ??? Other Yes rectocel, endocel, laproscopic enflunoplication     Pertinent medical/surgical history reviewed  Pertinent family history reviewed  Social History     Tobacco Use   ??? Smoking status: Never Smoker   ??? Smokeless tobacco: Never Used   Substance Use Topics   ??? Alcohol use: No   ??? Drug use: No     Social History     Substance and Sexual Activity   Drug Use No     Allergies:  Adhesive tape (rosins); Contrast dye iv, iodine containing [iodinated contrast media]; Sulfa (sulfonamide antibiotics); Codeine; Morphine; Penicillin g; and Tramadol  Medications  No current facility-administered medications for this encounter.        Review of Systems:  Pertinent review of systems completed and negative.           Physical Exam:     General appearance: alert and cooperative  Throat: Lips, mucosa, and tongue normal. Teeth and gums normal  Lungs: clear to auscultation bilaterally  Heart: regular rate and rhythm, S1, S2 normal, no murmur, click, rub or gallop  Abdomen: soft, non-tender. Bowel sounds normal. No masses,  no organomegaly  Extremities: extremities normal, atraumatic, no cyanosis or edema            NPO Status:     Airway:  airway assessment performed Anesthesia Classification:  ASA III (A patient with a severe systemic disease that limits activity, but is not incapacitating)    Lab/Radiology/Other Diagnostic Tests  Labs:  Hematology:    Lab Results   Component Value Date    HGB 8.9 05/20/2019    HCT 27.6 05/20/2019    PLTCT 188 05/20/2019    WBC 6.9 05/20/2019    NEUT 71 05/20/2019    ANC 4.91 05/20/2019    LYMPH 21.1 03/26/2019    ALC 1.23 05/20/2019    RBC 3.04 05/20/2019    MONA 9 05/20/2019    AMC 0.64 05/20/2019    EOSA 1 05/20/2019    ABC 0.08 05/20/2019    BASOPHILS 2.3 03/26/2019    MCV 90.7 05/20/2019    MCH 29.3 05/20/2019    MCHC 32.3 05/20/2019    MPV 9.3 05/20/2019    RDW 14.6 05/20/2019   , Coagulation:    Lab Results   Component Value Date    PT 13.2 03/26/2019    PTT 32.5 12/15/2018    INR 1.2 05/20/2019   , General Chemistry:    Lab Results   Component Value Date    NA 141 05/20/2019    K 3.7 05/20/2019    CL 103 05/20/2019    CO2 29 05/20/2019    GAP 9 05/20/2019    BUN 23 05/20/2019    CR 1.20 05/20/2019    GLU 100 05/20/2019  GLU 117 03/26/2019    CA 9.2 05/20/2019    ALBUMIN 3.4 05/20/2019    LACTIC 2.6 01/31/2019    MG 1.8 01/30/2019    TOTBILI 1.0 05/20/2019    and Enzymes:    Lab Results   Component Value Date    AST 38 05/20/2019    ALT 20 05/20/2019    ALKPHOS 187 05/20/2019    LIPASE 53 12/15/2018         Dawna Part, MD  Pager

## 2019-06-19 NOTE — Telephone Encounter
3:42 VM from [facility] regarding medication.  Please return her call.

## 2019-06-19 NOTE — Anesthesia Post-Procedure Evaluation
Post-Anesthesia Evaluation    Name: Kristin Moody      MRN: [id number]     DOB: [birth year]     Age: 63 y.o.     Sex: female   __________________________________________________________________________     Procedure Information     Anesthesia Start Date/Time:  06/19/19 1001    Procedure:  ESOPHAGOGASTRODUODENOSCOPY WITH BIOPSY - FLEXIBLE (N/A ) - et  APC    Location:  ENDO 4 / ENDO/GI    Surgeon:  Felicita Gage, MD          Post-Anesthesia Vitals  BP: 100/65 (07/31 1045)  Temp: 36.7 C (98.1 F) (07/31 1032)  Pulse: 94 (07/31 1045)  Respirations: 17 PER MINUTE (07/31 1045)  SpO2: 98 % (07/31 1045)   Vitals Value Taken Time   BP 100/65 06/19/2019 10:45 AM   Temp 36.7 C (98.1 F) 06/19/2019 10:32 AM   Pulse 94 06/19/2019 10:45 AM   Respirations 17 PER MINUTE 06/19/2019 10:45 AM   SpO2 98 % 06/19/2019 10:45 AM         Post Anesthesia Evaluation Note    Evaluation location: Pre/Post  Patient participation: recovered; patient participated in evaluation  Level of consciousness: alert    Pain score: 0  Pain management: adequate    Hydration: normovolemia  Temperature: 36.0C - 38.4C  Airway patency: adequate    Perioperative Events       Post-op nausea and vomiting: no PONV    Postoperative Status  Cardiovascular status: hemodynamically stable  Respiratory status: spontaneous ventilation        Perioperative Events  Perioperative Event: No  Emergency Case Activation: No

## 2019-06-19 NOTE — Discharge Planning (AHS/AVS)
EGD/Upper EUS/ERCP/Antegrade Enteroscopy  Post Upper Endoscopy Instructions      -Nothing to eat or drink for 1.5 hours after your procedure if you have had the numbing gargle or spray.  Start with small sips of water at _____________.  If tolerated well, you may advance your diet as tolerated or directed by your physician.      -You may have a sore throat after the procedure for 2-3 days.  Try sucrets or lozenges to help ease the pain.  If it continues please contact us.    -If you feel feverish, have a temperature of 101 degrees or higher, persistent nausea and vomiting, abdominal pain or dark stools; please notify your nurse or GI physician.    -You may have abdominal cramping following the procedure this can be relieved by belching or passing air.    -If you have redness or swelling at the IV site, place a warm, wet washcloth over the affected areas for 15 minutes, 3-4 times a day until the redness subsides.  If symptoms continue for 2-3 days, contact your regular physician.    - If you have bleeding from your mouth, over 2 tablespoons and increasing, please notify your physician.  A small amount of bleeding is normal if a biopsy or polyps were taken.  If you are vomiting blood you need to seek immediate medical attention.    - You may resume all your routine medications, if medications need to be held your physician and/or nurse will notify you post procedure.      OUTPATIENTS:  A. Because of sedation and lack of coordination, FOR THE NEXT 24 HOURS, DO NOT:  1. Operate any motorized vehicle - this includes driving.  2. Sign any legal documents or conduct important business matters.  3. Use any dangerous machinery (chain saw, lawnmower, etc.).  4. Drink any alcoholic beverages.  Should you have any questions or concerns after your procedure please call [phone] M-F 8am-5:00 pm. After 5:00 pm, holidays or weekends call [phone] and ask for the GI Doctor on call.      Diet after Procedure: Please return to your previous diet as tolerated.

## 2019-06-20 ENCOUNTER — Encounter: Admit: 2019-06-20 | Discharge: 2019-06-20

## 2019-06-20 DIAGNOSIS — K5730 Diverticulosis of large intestine without perforation or abscess without bleeding: Secondary | ICD-10-CM

## 2019-06-20 DIAGNOSIS — I729 Aneurysm of unspecified site: Secondary | ICD-10-CM

## 2019-06-20 DIAGNOSIS — Z01810 Encounter for preprocedural cardiovascular examination: Secondary | ICD-10-CM

## 2019-06-20 DIAGNOSIS — K7460 Unspecified cirrhosis of liver: Secondary | ICD-10-CM

## 2019-06-20 DIAGNOSIS — I10 Essential (primary) hypertension: Secondary | ICD-10-CM

## 2019-06-20 DIAGNOSIS — J329 Chronic sinusitis, unspecified: Secondary | ICD-10-CM

## 2019-06-20 DIAGNOSIS — K760 Fatty (change of) liver, not elsewhere classified: Secondary | ICD-10-CM

## 2019-06-20 DIAGNOSIS — R0600 Dyspnea, unspecified: Secondary | ICD-10-CM

## 2019-06-20 DIAGNOSIS — E669 Obesity, unspecified: Secondary | ICD-10-CM

## 2019-06-20 DIAGNOSIS — E119 Type 2 diabetes mellitus without complications: Secondary | ICD-10-CM

## 2019-06-22 NOTE — Telephone Encounter
patient call returned, all questions answered. EGD reviewed, s/p APC on 7/31. Referral placed for repeat EGD in 1 month.

## 2019-06-23 ENCOUNTER — Encounter: Admit: 2019-06-23 | Discharge: 2019-06-23

## 2019-06-23 ENCOUNTER — Ambulatory Visit: Admit: 2019-06-23 | Discharge: 2019-06-24

## 2019-06-23 DIAGNOSIS — R0600 Dyspnea, unspecified: Secondary | ICD-10-CM

## 2019-06-23 DIAGNOSIS — E673 Hypervitaminosis D: Secondary | ICD-10-CM

## 2019-06-23 DIAGNOSIS — K760 Fatty (change of) liver, not elsewhere classified: Secondary | ICD-10-CM

## 2019-06-23 DIAGNOSIS — K7460 Unspecified cirrhosis of liver: Secondary | ICD-10-CM

## 2019-06-23 DIAGNOSIS — E669 Obesity, unspecified: Secondary | ICD-10-CM

## 2019-06-23 DIAGNOSIS — J329 Chronic sinusitis, unspecified: Secondary | ICD-10-CM

## 2019-06-23 DIAGNOSIS — I10 Essential (primary) hypertension: Secondary | ICD-10-CM

## 2019-06-23 DIAGNOSIS — M818 Other osteoporosis without current pathological fracture: Secondary | ICD-10-CM

## 2019-06-23 DIAGNOSIS — Z01810 Encounter for preprocedural cardiovascular examination: Secondary | ICD-10-CM

## 2019-06-23 DIAGNOSIS — I729 Aneurysm of unspecified site: Secondary | ICD-10-CM

## 2019-06-23 DIAGNOSIS — K5730 Diverticulosis of large intestine without perforation or abscess without bleeding: Secondary | ICD-10-CM

## 2019-06-23 DIAGNOSIS — E119 Type 2 diabetes mellitus without complications: Secondary | ICD-10-CM

## 2019-06-23 DIAGNOSIS — R945 Abnormal results of liver function studies: Secondary | ICD-10-CM

## 2019-06-23 LAB — CBC AND DIFF
Lab: 0.1 10*3/uL (ref 0–0.20)
Lab: 0.1 10*3/uL (ref 0–0.45)
Lab: 0.6 10*3/uL (ref 0–0.80)
Lab: 1.3 10*3/uL (ref 1.0–4.8)
Lab: 11 % (ref 4–12)
Lab: 15 % — ABNORMAL HIGH (ref 11–15)
Lab: 199 K/UL — ABNORMAL HIGH (ref 150–400)
Lab: 2 % — ABNORMAL LOW (ref >60)
Lab: 2 % — ABNORMAL LOW (ref >60)
Lab: 2.6 M/UL — ABNORMAL LOW (ref >60)
Lab: 22 % — ABNORMAL LOW (ref 24–44)
Lab: 22 % — ABNORMAL LOW (ref 36–45)
Lab: 27 pg (ref 26–34)
Lab: 3.8 10*3/uL (ref 1.8–7.0)
Lab: 32 g/dL (ref 32.0–36.0)
Lab: 6.2 K/UL — ABNORMAL LOW (ref >60)
Lab: 63 % (ref 41–77)
Lab: 7.4 g/dL — ABNORMAL LOW (ref 12.0–15.0)
Lab: 8.8 FL (ref 7–11)
Lab: 84 FL (ref 80–100)

## 2019-06-23 LAB — COMPREHENSIVE METABOLIC PANEL
Lab: 139 MMOL/L — ABNORMAL HIGH (ref 137–147)
Lab: 3.6 MMOL/L (ref 3.5–5.1)

## 2019-06-23 LAB — TSH WITH FREE T4 REFLEX: Lab: 3.3 uU/mL (ref 0.35–5.00)

## 2019-06-23 LAB — PROTIME INR (PT): Lab: 1.3 U/L — ABNORMAL HIGH (ref 0.8–1.2)

## 2019-06-23 LAB — PARATHYROID HORMONE: Lab: 74 pg/mL — ABNORMAL HIGH (ref 10–65)

## 2019-06-23 MED ORDER — ZOLEDRONIC ACID-MANNITOL-WATER 5 MG/100 ML IV PGBK
5 mg | Freq: Once | INTRAVENOUS | 0 refills | Status: CN
Start: 2019-06-23 — End: ?

## 2019-06-23 NOTE — Progress Notes
Date of Service: 06/23/2019    Kristin Moody is a 63 y.o. female. DOB: 10-07-1956   MRN#: [id number]    Subjective:       History of Present Illness     Kristin Moody is a pleasant 63 y/o F who presents to Endocrinology clinic for initial consultation for osteoporosis. She was referred by her hepatologist Dr. Ladona Ridgel. 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    She is here today with her husband [name].  They live in [place].    Patient believes that she has had osteoporosis for several years.  She denies any prior treatment of her osteoporosis.  She reports having some balance issues in March of this year due to having severe anemia and requiring multiple blood transfusions.  Since correcting this issue she has not had any balance issues.  She denies any falls since March.  She reports that she is undergoing current work-up for liver transplant.    Prior treatment: no prior treatment   Previous Fractures: no fragility fracture, she had a ruptured ankle slipping and falling going out to feed pets, broken finger with steel dropping on it   Recent falls: she did fall in March in 2020, but since then hsa been okay after blood transfusions  Dental issues: no issues but she is getting a new set of dentures, no native teeth   last dental visit: she just sees the denture specialist, she had bad teeth as a young child (she had to have her baby teeth pulled)   GERD symptoms: she does, she is on a PPI but it is not controlled all the way, she has had it for a long time  difficulty swallowing:some times   Kidney stones: no  Smoking: no  Alcohol: no  Steroid exposure: she has only had prednisone whens hew as going in for a procedure (2x) she also gets a steroid injection in her left hip every 3 months (in the groin) she has been getting these >3 years   Vit D: none   Calcium intake: no supplements, milk every morning and milk in the middle of the night, 2 cups at least of milk, she will have cheese   Menarche/Menopause: hysterectomy around the age of 27, total, she was on HRT after surgery, she was on it several years   Cancer: no  Radiation: no  FH: mom with osteoporosis, no family history of hip fracture  - Rheumatoid arthritis: no  - H/o seizures (anticonvulsant use): no       Review of Systems   Constitution: Negative for chills and fever.   HENT: Positive for sore throat (after the scope).    Eyes: Negative for visual disturbance (new glasses ).   Cardiovascular: Negative for chest pain.   Respiratory: Negative for shortness of breath.    Endocrine: Positive for polydipsia.   Hematologic/Lymphatic: Bruises/bleeds easily.   Skin: Negative for rash.   Musculoskeletal: Negative for falls (no recent falls).   Gastrointestinal: Positive for heartburn.   Genitourinary: Positive for dysuria.   Neurological: Positive for headaches.   Psychiatric/Behavioral: The patient does not have insomnia.        Medical History:   Diagnosis Date   ??? Aneurysm (HCC)    ??? Cirrhosis of liver (HCC)     decompensated liver failure   ??? Diverticulosis of colon  descending and sigmoid colon   ??? Dyspnea    ??? Essential hypertension 08/13/2012   ??? Fatty infiltration of liver    ??? HTN (hypertension)    ??? Obesity (BMI 30-39.9) 05/13/2011   ??? Preop cardiovascular exam 01/07/2019   ??? Sinus infection    ??? Type 2 diabetes mellitus (HCC) 09/21/2014     Surgical History:   Procedure Laterality Date   ??? HYSTERECTOMY  1994   ??? UPPER GASTROINTESTINAL ENDOSCOPY  2009   ??? COLONOSCOPY  2009   ??? CYSTOCELE REPAIR  2009    with endocele repair   ??? RECTOCELE REPAIR  03/2009   ??? LIVER BIOPSY  07/17/2010   ??? COLONOSCOPY N/A 11/08/2017    Performed by Dawna Part, MD at [facility] ENDO   ??? ESOPHAGOGASTRODUODENOSCOPY N/A 11/08/2017 Performed by Dawna Part, MD at [facility] ENDO   ??? ESOPHAGOGASTRODUODENOSCOPY WITH BIOPSY - FLEXIBLE  11/08/2017    Performed by Dawna Part, MD at [facility] ENDO   ??? COLONOSCOPY WITH HOT BIOPSY FORCEPS REMOVAL TUMOR/ POLYP/ OTHER LESION  11/08/2017    Performed by Dawna Part, MD at [facility] ENDO   ??? ESOPHAGOGASTRODUODENOSCOPY WITH BAND LIGATION ESOPHAGEAL/ GASTRIC VARICES - FLEXIBLE N/A 04/10/2018    Performed by Normajean Baxter, MD at [facility] ENDO   ??? ESOPHAGOGASTRODUODENOSCOPY WITH BIOPSY - FLEXIBLE N/A 04/10/2018    Performed by Normajean Baxter, MD at [facility] ENDO   ??? ESOPHAGOGASTRODUODENOSCOPY WITH CONTROL OF BLEEDING - FLEXIBLE N/A 04/25/2018    Performed by Jolee Ewing, MD at [facility] ENDO   ??? ESOPHAGOGASTRODUODENOSCOPY WITH BIOPSY - FLEXIBLE with push enteroscopy N/A 08/28/2018    Performed by Celesta Gentile, MD at [facility] ENDO   ??? EGD N/A 10/27/2018    Performed by Eliott Nine, MD at [facility] ENDO   ??? ESOPHAGOGASTRODUODENOSCOPY WITH SNARE REMOVAL TUMOR/ POLYP/ OTHER LESION - FLEXIBLE N/A 10/27/2018    Performed by Eliott Nine, MD at [facility] ENDO   ??? ESOPHAGOGASTRODUODENOSCOPY WITH BIOPSY - FLEXIBLE N/A 12/16/2018    Performed by Buckles, Vinnie Level, MD at [facility] ENDO   ??? ESOPHAGOGASTRODUODENOSCOPY WITH CONTROL OF BLEEDING - FLEXIBLE N/A 12/16/2018    Performed by Buckles, Vinnie Level, MD at [facility] ENDO   ??? ESOPHAGOGASTRODUODENOSCOPY WITH SPECIMEN COLLECTION BY BRUSHING/ WASHING N/A 01/22/2019    Performed by Veneta Penton, MD at [facility] ENDO   ??? ESOPHAGOGASTRODUODENOSCOPY WITH DILATION ESOPHAGUS WITH BALLOON 30 MM OR GREATER - FLEXIBLE N/A 01/22/2019    Performed by Veneta Penton, MD at [facility] ENDO   ??? ESOPHAGOGASTRODUODENOSCOPY WITH BIOPSY - FLEXIBLE N/A 01/22/2019    Performed by Veneta Penton, MD at [facility] ENDO   ??? ESOPHAGOGASTRODUODENOSCOPY WITH BIOPSY - FLEXIBLE N/A 06/19/2019    Performed by Dawna Part, MD at [facility] ENDO   ??? CHOLECYSTECTOMY     ??? COLONOSCOPY     ??? ESOPHAGOGASTRIC FUNDOPLICATION  2003, 2004    laparoscopic   ??? ESOPHAGOGASTRIC FUNDOPLICATION ??? PR ESOPHAGOSCOPY FLEXIBLE TRANSORAL DIAGNOSTIC       Social History     Socioeconomic History   ??? Marital status: Married     Spouse name: Not on file   ??? Number of children: 1   ??? Years of education: Not on file   ??? Highest education level: Not on file   Occupational History   ??? Occupation: Geographical information systems officer: EMPLOYED   Tobacco Use   ??? Smoking status: Never Smoker   ??? Smokeless tobacco: Never Used  Substance and Sexual Activity   ??? Alcohol use: No   ??? Drug use: No   ??? Sexual activity: Yes     Partners: Male     Birth control/protection: Other   Other Topics Concern   ??? Not on file   Social History Narrative    Negative  For tobacco, alcohol, or illicit drug use.  No high risk sexual behavior or tattoo placement and no transfusion prior to 1992.  Married, one child healthy.  Works in Radio broadcast assistant.    Works in [place]      Family History   Problem Relation Age of Onset   ??? Other Mother    ??? Kidney Failure Mother    ??? Osteoporosis Mother    ??? Cancer Father         esophageal   ??? Liver Disease Sister         endstage, s/p liver transplant   ??? Cancer Brother         tonsil, liver    ??? Hip Fracture Neg Hx          Objective:     ??? atorvastatin (LIPITOR) 10 mg tablet TAKE 1 TABLET BY MOUTH EVERY DAY   ??? duloxetine DR (CYMBALTA) 60 mg capsule Take 60 mg by mouth daily.   ??? ferrous sulfate (FEOSOL) 325 mg (65 mg iron) tablet Take one tablet by mouth twice daily. Take on an empty stomach at least 1 hour before or 2 hours after food.   ??? furosemide (LASIX) 40 mg tablet Take two tablets by mouth daily.   ??? pantoprazole DR (PROTONIX) 40 mg tablet Take one tablet by mouth twice daily.   ??? polyethylene glycol 3350 (MIRALAX) 17 g packet Take one packet by mouth twice daily. (Patient taking differently: Take 17 g by mouth daily.)   ??? potassium chloride SR (K-DUR) 20 mEq tablet Take one tablet by mouth daily. Take with a meal and a full glass of water. ??? promethazine (PHENERGAN) 25 mg tablet Take 25 mg by mouth every 6 hours as needed.   ??? spironolactone (ALDACTONE) 50 mg tablet Take one tablet by mouth daily. Take with food.   ??? vitamin A 10,000 unit capsule Take one capsule by mouth daily.   ??? zinc sulfate 220 mg (50 mg elemental zinc) capsule Take one capsule by mouth daily.     Vitals:    06/23/19 1411   BP: 110/59   Pulse: 89   PainSc: Zero     There is no height or weight on file to calculate BMI.     Physical Exam  Constitutional:       General: She is not in acute distress.     Appearance: Normal appearance.   HENT:      Head: Normocephalic and atraumatic.      Mouth/Throat:      Comments: Wearing a mask  Eyes:      Conjunctiva/sclera: Conjunctivae normal.      Pupils: Pupils are equal, round, and reactive to light.   Neck:      Musculoskeletal: Neck supple.   Cardiovascular:      Rate and Rhythm: Regular rhythm.   Pulmonary:      Effort: No respiratory distress.      Breath sounds: No wheezing.   Abdominal:      General: Bowel sounds are normal.   Musculoskeletal:         General: Swelling (Bilateral lower extremity swelling) present.   Lymphadenopathy:  Cervical: No cervical adenopathy.   Skin:     General: Skin is warm and dry.   Neurological:      Mental Status: She is alert and oriented to person, place, and time.   Psychiatric:         Mood and Affect: Mood normal.         Behavior: Behavior normal.                 Labs and imaging reviewed:  Results for AMANDINE, AUGER (MRN [id number]) as of 06/23/2019 16:41   Ref. Range 05/20/2019 14:09   Sodium Latest Ref Range: 137 - 147 MMOL/L 141   Potassium Latest Ref Range: 3.5 - 5.1 MMOL/L 3.7   Chloride Latest Ref Range: 98 - 110 MMOL/L 103   CO2 Latest Ref Range: 21 - 30 MMOL/L 29   Anion Gap Latest Ref Range: 3 - 12  9   Blood Urea Nitrogen Latest Ref Range: 7 - 25 MG/DL 23   Creatinine Latest Ref Range: 0.4 - 1.00 MG/DL 4.54 (H) eGFR Non African American Latest Ref Range: >60 mL/min 46 (L)   eGFR African American Latest Ref Range: >60 mL/min 55 (L)   Glucose Latest Ref Range: 70 - 100 MG/DL 098   Albumin Latest Ref Range: 3.5 - 5.0 G/DL 3.4 (L)   Calcium Latest Ref Range: 8.5 - 10.6 MG/DL 9.2   Total Bilirubin Latest Ref Range: 0.3 - 1.2 MG/DL 1.0   Total Protein Latest Ref Range: 6.0 - 8.0 G/DL 6.7   AST (SGOT) Latest Ref Range: 7 - 40 U/L 38   ALT (SGPT) Latest Ref Range: 7 - 56 U/L 20   Alk Phosphatase Latest Ref Range: 25 - 110 U/L 187 (H)       Results for KATRIEL, WARZECHA (MRN [id number]) as of 06/23/2019 16:41   Ref. Range 08/12/2014 11:35 04/23/2018 19:51 10/24/2018 18:02 12/15/2018 07:29 12/15/2018 13:05   TSH Latest Ref Range: 0.35 - 5.00 MCU/ML 1.506 2.410 3.52 2.69    T4 (Total) Latest Ref Range: 6.1 - 12.3 MCG/DL    29.5 (H)    Carbon Monoxide Latest Ref Range: <2.0 %     2.5 (H)             Assessment and Plan:      Osteoporosis (risk factors include family history, steroid injections and cirrhosis)  No history of fragility fracture  -No native teeth, dentures in place  -Family history of osteoporosis in mother  -No current history of alcohol or tobacco use  Prior treatment: None  Plan:  Today we reviewed the concept of osteoporosis.  We reviewed patient's bone density from January 2020 showing osteoporosis of the left forearm.  Her neck left had a T score of -2.5 as well.  Given her bone mineral density showing osteoporosis in the setting of chronic steroid use would recommend treatment for osteoporosis with Reclast.   -We will plan for total of 3 doses of Reclast once yearly--creatinine clearance calculated today and 48   -Discussed side effects of this medication including but not limited to flulike symptoms, iritis, atypical femur fracture and osteonecrosis of the jaw, patient voiced understanding  -Discussed importance of vitamin D supplementation, will recheck vitamin D as patient with high level in the past  - discussed importance of calcium in diet, encouraged patient to increase calcium in her diet by 1 serving daily and start taking 1 Tums daily, instructions provided below  -  Encouraged weightbearing exercises as safely able to  -We will obtain other work-up to evaluate for secondary causes of osteoporosis including TSH, PTH, 24-hour urine, and vitamin D  We will plan for repeat bone mineral density in 1 year        Hypervitaminosis D  25 OH vitamin D 84 in January 2020  Patient was recommended to stop vitamin D at that time  We will recheck vitamin D today to assess whether or not patient needs to start back on lower dose                    Patient seen by and discussed with Dr. Okey Dupre  RTC in 1year with repeat BMD           Visit Diagnoses   1. Other osteoporosis without current pathological fracture    2. Hypervitaminosis D         Orders Placed This Encounter   ??? BONE DENSITY SPINE/HIP   ??? 25-OH VITAMIN D (D2 + D3) today   ??? CALCIUM-URINE 24HR today   ??? SODIUM-URINE 24 HR today   ??? CREATININE-URINE 24 HR today   ??? TSH WITH FREE T4 REFLEX   ??? PARATHYROID HORMONE       Return in about 1 year (around 06/22/2020).    Patient Instructions   Our plan:    Please head to the first floor for lab work today.    please get a urine container at the lab   For your 24 hour urine collection:  Please pick up a urine container from the lab.  Please flush the first urination of the day, then collect your urine for the next 24 hours, including the first urination of the following day.   It may be easiest to do this a day when you are home.       we will plan to start reclast an injectable medication once yearly    -we would recommend taking a tylenol tablet if you feel that you have flu like symptoms after and stay well hydrated before during and after the infusion   -let us know if you have any inflammation of your eyes or other symptoms after the infusion the infusion will be at the following clinic:  [facility]  [address] [place], [place] [address]  OFFICE HOURS  [employer] DIRECTIONS  Office Contact:  [phone]    we will plan to see you back in 1 year with a repeat bone density scan     take in one tablet of calcium carbonate daily (tums) 500mg  daily     please try to get one more serving of calcium in your diet daily            We will contact you with testing results via phone or letter.  If you are signed up for my chart, we will release results to you electronically.     ??? Please call my nurse between appointments with any issues -- Bjorn Loser at [phone].  ??? Please call my nurse if you are having any issues scheduling your appointment.  ??? Please let Bjorn Loser know if any results are unclear.     If you had to wait on me today, my sincerest apologies.  My goal in every clinic is to run right on time; however, on occasion, I get behind in clinic due to unexpected issues with patients.    Please let me know if  you have any questions or concerns. We are here to help!    Take care,  Johnella Moloney, MD       For Medication Refills:  Please contact your pharmacy directly to request medication refills.  Please allow at least 72 hours for refill requests  Please bring an updated medication list or your medication bottles to your appointments. Refills can be completed during appointment times as well.     Alternate [place] Lab Locations    [facility]  [phone] [employer]. (at the corner of [address])  [facility], first floor  Monday [phone]  Tues-Fri 0700-1800  Call for Saturday hours, usually 0800-1200  [phone]     [facility]   [address]   [place], [place]   2nd floor   Mon-Fri 0800-1700  [phone]    [place] Pao  [facility]  [address]  [place], [place] [address]  [phone]  ??? 7 a.m.-5:30 p.m. Monday-Friday ??? 7 a.m.-noon [facility]  [address], [place], [place]  Main floor   [place] 4742-5956  [phone]    ALL SITES ABOVE TAKE WALK IN PATIENTS FOR BLOOD DRAWS                    What Is Osteoporosis?  Osteoporosis is a disease that weakens the bones. Weakened bones are more likely to break (fracture). Osteoporosis affects both men and women. But postmenopausal women are most at risk. To help prevent osteoporosis, you need to exercise and nourish your bones throughout your life.     Childhood  The body builds the most bone during these years. That's why boys and girls need foods rich in calcium. They also need plenty of exercise. A healthy diet and exercise helps bones grow strong.   Young adulthood to age 48  During young adulthood, bones become their strongest. This is called peak bone mass. The same good habits that kept bones healthy in childhood help keep bones healthy in adulthood.   Age 68 to menopause  Bone mass declines slightly during these years. Your body makes just enough new bone to maintain peak bone mass. To keep your bones at their peak mass, be sure to exercise and get plenty of calcium.   After menopause  Menopause is when a woman stops having monthly periods. After menopause, the body makes less estrogen (female hormone). This increases bone loss. At this point, treatment may be needed to reduce the risk for fracture. Exercise and calcium can also help keep your bones strong.   Later in life  In later years, both men and women need to take extra care of their bones. By this point, the body loses more bone than it makes. If too much bone is lost, you may be at increased risk for fractures. With age, the quality and quantity of bone declines. You can lessen bone loss by staying active and increasing your calcium intake. Calcium supplements and other osteoporosis treatments do have risks. So talk with your healthcare provider if you have concerns. If you have osteoporosis, you can also learn ways to increase everyday safety.   [employer]Well last reviewed this educational content on 03/19/2017  ??? 2000-2020 [employer], [place]. [address], [place], [place] [address]. All rights reserved. This information is not intended as a substitute for professional medical care. Always follow your healthcare professional's instructions.  Living with Osteoporosis: Regular Exercise  If you have osteoporosis, exercise is vital for your health. It can prevent bone fractures and spine changes. It will slow bone loss. Exercise will strengthen your body. It can also be fun. A variety of exercises is best. See below for exercises that can help you. But before you start, talk with your???healthcare provider to be sure these exercises are right for you.     Resistance exercises. These build muscle strength and maintain bone mass. They also make you less prone to injury. Exercises include lifting small weights,???doing push-ups and sit-ups, using elastic exercise bands, and using weight machines.   Weight-bearing activities. These help your whole body. They also help you maintain bone mass. Activities include walking, dancing, and housework.   Non-weight-bearing exercises. These help prevent back strain and pain. They do this by building the trunk and leg muscles. Exercises that help with flexibility can prevent falls. Examples include swimming, water exercise, and stretching.   Staying safe  Here are tips to stay safe:???  ??? Always check with your healthcare provider before starting any new exercise program.  ??? Use weights only as instructed.  ??? Stop any exercise that causes pain.  StayWell last reviewed this educational content on 03/19/2017  ??? 2000-2020 [employer], [place]. [address], [place], [place] [address]. All rights reserved. This information is not intended as a substitute for professional medical care. Always follow your healthcare professional's instructions.        Living with Osteoporosis: Preventing Fractures   BIG: If you have osteoporosis, you can do a lot to reduce its effect on your life. Knowing how to prevent fractures and spinal curvature can help you live more comfortably and safely with this disease.     Reducing your risk???for fractures  The most common fracture sites in people with osteoporosis are the wrist, spine, and hip. These fractures are often caused by accidents and falls. All fractures are painful and may limit what you can do. But hip fractures are very serious. They often need surgery, and it can take months to recover. To reduce your risk???for fractures:   ??? Get regular exercise. Try walking, swimming, or weight training.  ??? Eat foods that are rich in calcium, or take calcium supplements.  ??? Make your home safe to help avoid accidents.  ??? Take extra precautions not to fall in risky areas, such as icy sidewalks.  Understanding spinal fractures  Your spine is made up of many bones called vertebrae. Osteoporosis can cause the vertebrae in your spine to collapse. As a result, your upper back may arch forward, creating a curvature. Spine fractures may also result from back strain and bad posture. You will also lose height. Your lower spine must then adjust to keep your body balanced. This can cause back pain. To prevent or lessen these spinal changes:   ??? Practice good posture.  ??? Use proper techniques if you need to lift heavy objects.  ??? Do back exercises to help your posture.  ??? Lie on your back when you have pain.  ??? Ask your healthcare provider about these and other ways to help your spine.  StayWell last reviewed this educational content on 03/19/2017  ??? 2000-2020 [employer], [place]. [address] [place], [place] [address]. All rights reserved. This information is not intended as a substitute for professional medical care. Always follow your healthcare professional's instructions.

## 2019-06-24 DIAGNOSIS — D509 Iron deficiency anemia, unspecified: Secondary | ICD-10-CM

## 2019-06-24 LAB — 25-OH VITAMIN D (D2 + D3): Lab: 52 ng/mL (ref 30–80)

## 2019-06-26 ENCOUNTER — Encounter: Admit: 2019-06-26 | Discharge: 2019-06-26

## 2019-06-26 ENCOUNTER — Ambulatory Visit: Admit: 2019-06-26 | Discharge: 2019-06-26

## 2019-06-26 DIAGNOSIS — K31819 Angiodysplasia of stomach and duodenum without bleeding: Secondary | ICD-10-CM

## 2019-06-26 DIAGNOSIS — Z1159 Encounter for screening for other viral diseases: Secondary | ICD-10-CM

## 2019-06-27 ENCOUNTER — Encounter: Admit: 2019-06-27 | Discharge: 2019-06-27

## 2019-06-27 NOTE — Progress Notes
ATTESTATION    I personally performed the key portions of the E/M visit, discussed case with resident and concur with resident documentation of history, physical exam, assessment, and treatment plan unless otherwise noted. pt with osteoporosis and liver cirrhosis.  We will obtain 24 our urine collection and labs, and plan to begin treatment with Reclast.  Sh is not a candidate for oral bisphosphonates due to liver disease and varicies.    Staff name:  Arville Lime, MD Date:  06/26/2019

## 2019-06-28 ENCOUNTER — Encounter: Admit: 2019-06-28 | Discharge: 2019-06-28

## 2019-06-29 ENCOUNTER — Encounter: Admit: 2019-06-29 | Discharge: 2019-06-29

## 2019-06-29 MED ORDER — PANTOPRAZOLE 40 MG PO TBEC
ORAL_TABLET | Freq: Two times a day (BID) | ORAL | 1 refills | 90.00000 days | Status: DC
Start: 2019-06-29 — End: 2020-01-21

## 2019-06-29 MED ORDER — VITAMIN A 10,000 UNIT PO CAP
ORAL_CAPSULE | Freq: Every day | 1 refills | Status: DC
Start: 2019-06-29 — End: 2019-09-11

## 2019-06-29 NOTE — Telephone Encounter
Spoke to patient and let her know visit with NT has been moved to [facility] w/ JT on 07/03/19 @ 11am. Discussed TH, zoom, and mychart on pt's smartphone for appt. Pt v/u and will download zoom. Pt will call office back if any issues arise.

## 2019-06-29 NOTE — Telephone Encounter
Pt LVM stating that she is returning Dr. Dory Peru call.    R/c and pt confirms she has read Dr. Dory Peru MyChart message and had no questions.    Pt plans to complete the urine collection this week and bring to our lab.    Pt aware to hold off scheduling Reclast until Dr. Oley Balm can review 24hr urine results.

## 2019-06-30 ENCOUNTER — Encounter: Admit: 2019-06-30 | Discharge: 2019-07-01 | Payer: MEDICARE

## 2019-07-01 ENCOUNTER — Encounter: Admit: 2019-07-01 | Discharge: 2019-07-02

## 2019-07-01 ENCOUNTER — Encounter: Admit: 2019-07-01 | Discharge: 2019-07-01

## 2019-07-01 DIAGNOSIS — M818 Other osteoporosis without current pathological fracture: Secondary | ICD-10-CM

## 2019-07-01 LAB — CALCIUM-URINE 24HR
Lab: 217 mg/(24.h) (ref 100–300)
Lab: 6.5 mg/dL

## 2019-07-01 LAB — SODIUM-URINE 24 HR
Lab: 174 mmol/(24.h) (ref 40–220)
Lab: 52 MMOL/L

## 2019-07-01 LAB — CREATININE-URINE 24 HR
Lab: 100 mg/(24.h) (ref 600–1600)
Lab: 30 mg/dL

## 2019-07-01 LAB — URINE COLLECTION
Lab: 335 mL
Lab: 24

## 2019-07-02 ENCOUNTER — Encounter: Admit: 2019-07-02 | Discharge: 2019-07-02

## 2019-07-02 DIAGNOSIS — K760 Fatty (change of) liver, not elsewhere classified: Secondary | ICD-10-CM

## 2019-07-03 ENCOUNTER — Encounter: Admit: 2019-07-03 | Discharge: 2019-07-03

## 2019-07-03 ENCOUNTER — Ambulatory Visit: Admit: 2019-07-03 | Discharge: 2019-07-03

## 2019-07-03 DIAGNOSIS — K7460 Unspecified cirrhosis of liver: Principal | ICD-10-CM

## 2019-07-03 DIAGNOSIS — J329 Chronic sinusitis, unspecified: Secondary | ICD-10-CM

## 2019-07-03 DIAGNOSIS — R188 Other ascites: Secondary | ICD-10-CM

## 2019-07-03 DIAGNOSIS — E119 Type 2 diabetes mellitus without complications: Secondary | ICD-10-CM

## 2019-07-03 DIAGNOSIS — R799 Abnormal finding of blood chemistry, unspecified: Secondary | ICD-10-CM

## 2019-07-03 DIAGNOSIS — I728 Aneurysm of other specified arteries: Secondary | ICD-10-CM

## 2019-07-03 DIAGNOSIS — E669 Obesity, unspecified: Secondary | ICD-10-CM

## 2019-07-03 DIAGNOSIS — K2210 Ulcer of esophagus without bleeding: Secondary | ICD-10-CM

## 2019-07-03 DIAGNOSIS — I10 Essential (primary) hypertension: Secondary | ICD-10-CM

## 2019-07-03 DIAGNOSIS — D500 Iron deficiency anemia secondary to blood loss (chronic): Secondary | ICD-10-CM

## 2019-07-03 DIAGNOSIS — I729 Aneurysm of unspecified site: Secondary | ICD-10-CM

## 2019-07-03 DIAGNOSIS — K7581 Nonalcoholic steatohepatitis (NASH): Secondary | ICD-10-CM

## 2019-07-03 DIAGNOSIS — R0600 Dyspnea, unspecified: Secondary | ICD-10-CM

## 2019-07-03 DIAGNOSIS — K31819 Angiodysplasia of stomach and duodenum without bleeding: Secondary | ICD-10-CM

## 2019-07-03 DIAGNOSIS — R601 Generalized edema: Secondary | ICD-10-CM

## 2019-07-03 DIAGNOSIS — Z713 Dietary counseling and surveillance: Secondary | ICD-10-CM

## 2019-07-03 DIAGNOSIS — K5730 Diverticulosis of large intestine without perforation or abscess without bleeding: Secondary | ICD-10-CM

## 2019-07-03 DIAGNOSIS — K760 Fatty (change of) liver, not elsewhere classified: Secondary | ICD-10-CM

## 2019-07-03 DIAGNOSIS — Z01810 Encounter for preprocedural cardiovascular examination: Secondary | ICD-10-CM

## 2019-07-03 DIAGNOSIS — I8511 Secondary esophageal varices with bleeding: Secondary | ICD-10-CM

## 2019-07-03 DIAGNOSIS — K7030 Alcoholic cirrhosis of liver without ascites: Secondary | ICD-10-CM

## 2019-07-03 MED ORDER — SPIRONOLACTONE 50 MG PO TAB
100 mg | ORAL_TABLET | Freq: Every day | ORAL | 3 refills | 46.00000 days | Status: DC
Start: 2019-07-03 — End: 2019-08-14

## 2019-07-03 NOTE — Progress Notes
The patient  reviewed the three documents sent via MyChart, agrees with the information and provided verbal consent for this visit.

## 2019-07-05 ENCOUNTER — Encounter: Admit: 2019-07-05 | Discharge: 2019-07-05

## 2019-07-05 DIAGNOSIS — R7989 Other specified abnormal findings of blood chemistry: Secondary | ICD-10-CM

## 2019-07-05 DIAGNOSIS — E349 Endocrine disorder, unspecified: Secondary | ICD-10-CM

## 2019-07-06 ENCOUNTER — Encounter: Admit: 2019-07-06 | Discharge: 2019-07-06 | Payer: MEDICARE

## 2019-07-06 NOTE — Telephone Encounter
Received a VM from Patient asking that lab orders be faxed to [facility] fax cover sheet to [facility] with request to fax labs to [phone]

## 2019-07-07 ENCOUNTER — Encounter: Admit: 2019-07-07 | Discharge: 2019-07-07 | Payer: MEDICARE

## 2019-07-07 DIAGNOSIS — E119 Type 2 diabetes mellitus without complications: Secondary | ICD-10-CM

## 2019-07-07 DIAGNOSIS — R799 Abnormal finding of blood chemistry, unspecified: Secondary | ICD-10-CM

## 2019-07-07 DIAGNOSIS — K7460 Unspecified cirrhosis of liver: Secondary | ICD-10-CM

## 2019-07-07 DIAGNOSIS — K5730 Diverticulosis of large intestine without perforation or abscess without bleeding: Secondary | ICD-10-CM

## 2019-07-07 DIAGNOSIS — K760 Fatty (change of) liver, not elsewhere classified: Secondary | ICD-10-CM

## 2019-07-07 DIAGNOSIS — R0600 Dyspnea, unspecified: Secondary | ICD-10-CM

## 2019-07-07 DIAGNOSIS — R978 Other abnormal tumor markers: Secondary | ICD-10-CM

## 2019-07-07 DIAGNOSIS — M899 Disorder of bone, unspecified: Secondary | ICD-10-CM

## 2019-07-07 DIAGNOSIS — J329 Chronic sinusitis, unspecified: Secondary | ICD-10-CM

## 2019-07-07 DIAGNOSIS — Z01810 Encounter for preprocedural cardiovascular examination: Secondary | ICD-10-CM

## 2019-07-07 DIAGNOSIS — I10 Essential (primary) hypertension: Secondary | ICD-10-CM

## 2019-07-07 DIAGNOSIS — E669 Obesity, unspecified: Secondary | ICD-10-CM

## 2019-07-07 DIAGNOSIS — I729 Aneurysm of unspecified site: Secondary | ICD-10-CM

## 2019-07-08 ENCOUNTER — Encounter: Admit: 2019-07-08 | Discharge: 2019-07-08

## 2019-07-08 DIAGNOSIS — R978 Other abnormal tumor markers: Secondary | ICD-10-CM

## 2019-07-08 DIAGNOSIS — M899 Disorder of bone, unspecified: Secondary | ICD-10-CM

## 2019-07-08 DIAGNOSIS — K7460 Unspecified cirrhosis of liver: Secondary | ICD-10-CM

## 2019-07-08 DIAGNOSIS — R799 Abnormal finding of blood chemistry, unspecified: Secondary | ICD-10-CM

## 2019-07-08 DIAGNOSIS — E349 Endocrine disorder, unspecified: Secondary | ICD-10-CM

## 2019-07-08 LAB — BASIC METABOLIC PANEL
Lab: 1.2 mg/dL — AB (ref 0.57–1.11)
Lab: 106 mmol/L (ref 98–107)
Lab: 129 mg/dL — AB (ref 70–105)
Lab: 139 mmol/L (ref 136–145)
Lab: 16 meq/L — AB (ref 0–14)
Lab: 21 mmol/L — AB (ref 23–31)
Lab: 30 mg/dL — AB (ref 9.8–20.1)
Lab: 4.2 mmol/L (ref 3.5–5.1)
Lab: 48 mL/min/{1.73_m2} (ref >59)
Lab: 8.9 mg/dL (ref 8.4–10.2)

## 2019-07-09 ENCOUNTER — Encounter: Admit: 2019-07-09 | Discharge: 2019-07-09

## 2019-07-09 DIAGNOSIS — E349 Endocrine disorder, unspecified: Secondary | ICD-10-CM

## 2019-07-09 LAB — ALBUMIN: Lab: 2.7 g/dL — AB (ref 3.4–4.8)

## 2019-07-09 LAB — PARATHYROID HORMONE: Lab: 50 pg/mL (ref 14–64)

## 2019-07-09 NOTE — Telephone Encounter
patient notified that CTA scheduled for 10/26. Patient will have CA19.9 drawn tomorrow at infusion clinic    Patient v/u she is scheduled for [place] 8/28, and EGD 9/18 for clinic f/u items.    Patient to repeat MELD labs in September.     BMP being reviewed by NP to assess kidney function after increasing diuretics at 8/14 OV, see result note

## 2019-07-10 ENCOUNTER — Encounter: Admit: 2019-07-10 | Discharge: 2019-07-10

## 2019-07-10 DIAGNOSIS — R799 Abnormal finding of blood chemistry, unspecified: Secondary | ICD-10-CM

## 2019-07-10 DIAGNOSIS — Z01810 Encounter for preprocedural cardiovascular examination: Secondary | ICD-10-CM

## 2019-07-10 DIAGNOSIS — K760 Fatty (change of) liver, not elsewhere classified: Secondary | ICD-10-CM

## 2019-07-10 DIAGNOSIS — I10 Essential (primary) hypertension: Secondary | ICD-10-CM

## 2019-07-10 DIAGNOSIS — M818 Other osteoporosis without current pathological fracture: Secondary | ICD-10-CM

## 2019-07-10 DIAGNOSIS — D500 Iron deficiency anemia secondary to blood loss (chronic): Secondary | ICD-10-CM

## 2019-07-10 DIAGNOSIS — K31819 Angiodysplasia of stomach and duodenum without bleeding: Secondary | ICD-10-CM

## 2019-07-10 DIAGNOSIS — K2210 Ulcer of esophagus without bleeding: Secondary | ICD-10-CM

## 2019-07-10 DIAGNOSIS — K5730 Diverticulosis of large intestine without perforation or abscess without bleeding: Secondary | ICD-10-CM

## 2019-07-10 DIAGNOSIS — K7460 Unspecified cirrhosis of liver: Secondary | ICD-10-CM

## 2019-07-10 DIAGNOSIS — E119 Type 2 diabetes mellitus without complications: Secondary | ICD-10-CM

## 2019-07-10 DIAGNOSIS — R601 Generalized edema: Secondary | ICD-10-CM

## 2019-07-10 DIAGNOSIS — R978 Other abnormal tumor markers: Secondary | ICD-10-CM

## 2019-07-10 DIAGNOSIS — R0600 Dyspnea, unspecified: Secondary | ICD-10-CM

## 2019-07-10 DIAGNOSIS — I728 Aneurysm of other specified arteries: Secondary | ICD-10-CM

## 2019-07-10 DIAGNOSIS — M899 Disorder of bone, unspecified: Secondary | ICD-10-CM

## 2019-07-10 DIAGNOSIS — J329 Chronic sinusitis, unspecified: Secondary | ICD-10-CM

## 2019-07-10 DIAGNOSIS — E669 Obesity, unspecified: Secondary | ICD-10-CM

## 2019-07-10 DIAGNOSIS — R188 Other ascites: Secondary | ICD-10-CM

## 2019-07-10 DIAGNOSIS — I729 Aneurysm of unspecified site: Secondary | ICD-10-CM

## 2019-07-10 LAB — CBC AND DIFF
Lab: 0.1 10*3/uL (ref 0–0.20)
Lab: 0.1 10*3/uL (ref 0–0.45)
Lab: 0.8 10*3/uL — ABNORMAL HIGH (ref 0–0.80)
Lab: 13 % — ABNORMAL HIGH (ref 4–12)
Lab: 16 % — ABNORMAL HIGH (ref 11–15)
Lab: 2 % — ABNORMAL LOW (ref >60)
Lab: 2 % — ABNORMAL LOW (ref >60)
Lab: 2.6 M/UL — ABNORMAL LOW (ref 4.0–5.0)
Lab: 20 % — ABNORMAL LOW (ref 24–44)
Lab: 249 10*3/uL — ABNORMAL HIGH (ref 150–400)
Lab: 3.9 10*3/uL (ref 1.8–7.0)
Lab: 6.3 K/UL (ref 4.5–11.0)
Lab: 9.1 FL (ref 7–11)

## 2019-07-10 MED ORDER — ZOLEDRONIC ACID-MANNITOL-WATER 5 MG/100 ML IV PGBK
5 mg | Freq: Once | INTRAVENOUS | 0 refills | Status: CN
Start: 2019-07-10 — End: ?

## 2019-07-10 MED ORDER — ZOLEDRONIC ACID-MANNITOL-WATER 5 MG/100 ML IV PGBK
5 mg | Freq: Once | INTRAVENOUS | 0 refills | Status: CP
Start: 2019-07-10 — End: ?
  Administered 2019-07-10: 20:00:00 5 mg via INTRAVENOUS

## 2019-07-10 NOTE — Patient Instructions
Zoledronic Acid injection (Paget's Disease, Osteoporosis)  Brand Names: Reclast, Zometa  What is this medicine?  ZOLEDRONIC ACID (ZOE le dron ik AS id) lowers the amount of calcium loss from bone. It is used to treat Paget's disease and osteoporosis in women.  How should I use this medicine?  This medicine is for infusion into a vein. It is given by a health care professional in a hospital or clinic setting.  Talk to your pediatrician regarding the use of this medicine in children. This medicine is not approved for use in children.  What side effects may I notice from receiving this medicine?  Side effects that you should report to your doctor or health care professional as soon as possible:  · allergic reactions like skin rash, itching or hives, swelling of the face, lips, or tongue  · anxiety, confusion, or depression  · breathing problems  · changes in vision  · eye pain  · feeling faint or lightheaded, falls  · jaw pain, especially after dental work  · mouth sores  · muscle cramps, stiffness, or weakness  · redness, blistering, peeling or loosening of the skin, including inside the mouth  · trouble passing urine or change in the amount of urine  Side effects that usually do not require medical attention (report to your doctor or health care professional if they continue or are bothersome):  · bone, joint, or muscle pain  · constipation  · diarrhea  · fever  · hair loss  · irritation at site where injected  · loss of appetite  · nausea, vomiting  · stomach upset  · trouble sleeping  · trouble swallowing  · weak or tired  What may interact with this medicine?  · certain antibiotics given by injection  · NSAIDs, medicines for pain and inflammation, like ibuprofen or naproxen  · some diuretics like bumetanide, furosemide  · teriparatide    What if I miss a dose?  It is important not to miss your dose. Call your doctor or health care professional if you are unable to keep an appointment.  Where should I keep my  medicine?  This drug is given in a hospital or clinic and will not be stored at home.  What should I tell my health care provider before I take this medicine?  They need to know if you have any of these conditions:  · aspirin-sensitive asthma  · cancer, especially if you are receiving medicines used to treat cancer  · dental disease or wear dentures  · infection  · kidney disease  · low levels of calcium in the blood  · past surgery on the parathyroid gland or intestines  · receiving corticosteroids like dexamethasone or prednisone  · an unusual or allergic reaction to zoledronic acid, other medicines, foods, dyes, or preservatives  · pregnant or trying to get pregnant  · breast-feeding  What should I watch for while using this medicine?  Visit your doctor or health care professional for regular checkups. It may be some time before you see the benefit from this medicine. Do not stop taking your medicine unless your doctor tells you to. Your doctor may order blood tests or other tests to see how you are doing.  Women should inform their doctor if they wish to become pregnant or think they might be pregnant. There is a potential for serious side effects to an unborn child. Talk to your health care professional or pharmacist for more information.  You should   make sure that you get enough calcium and vitamin D while you are taking this medicine. Discuss the foods you eat and the vitamins you take with your health care professional.  Some people who take this medicine have severe bone, joint, and/or muscle pain. This medicine may also increase your risk for jaw problems or a broken thigh bone. Tell your doctor right away if you have severe pain in your jaw, bones, joints, or muscles. Tell your doctor if you have any pain that does not go away or that gets worse.  Tell your dentist and dental surgeon that you are taking this medicine. You should not have major dental surgery while on this medicine. See your dentist to  have a dental exam and fix any dental problems before starting this medicine. Take good care of your teeth while on this medicine. Make sure you see your dentist for regular follow-up appointments.  NOTE:This sheet is a summary. It may not cover all possible information. If you have questions about this medicine, talk to your doctor, pharmacist, or health care provider. Copyright© 2020 [employer]

## 2019-07-10 NOTE — Progress Notes
1510:  Patient tolerated infusion without difficulty.  No signs of reaction noted.  Medication education provided.

## 2019-07-11 ENCOUNTER — Encounter: Admit: 2019-07-11 | Discharge: 2019-07-11

## 2019-07-11 ENCOUNTER — Ambulatory Visit: Admit: 2019-07-10 | Discharge: 2019-07-11

## 2019-07-11 DIAGNOSIS — K7460 Unspecified cirrhosis of liver: Secondary | ICD-10-CM

## 2019-07-11 DIAGNOSIS — I8511 Secondary esophageal varices with bleeding: Secondary | ICD-10-CM

## 2019-07-11 DIAGNOSIS — R978 Other abnormal tumor markers: Secondary | ICD-10-CM

## 2019-07-11 DIAGNOSIS — K7581 Nonalcoholic steatohepatitis (NASH): Secondary | ICD-10-CM

## 2019-07-11 DIAGNOSIS — K7030 Alcoholic cirrhosis of liver without ascites: Secondary | ICD-10-CM

## 2019-07-11 DIAGNOSIS — R945 Abnormal results of liver function studies: Secondary | ICD-10-CM

## 2019-07-11 DIAGNOSIS — Z713 Dietary counseling and surveillance: Secondary | ICD-10-CM

## 2019-07-11 LAB — HEMOGLOBIN A1C: Lab: 4.6 % (ref 4.0–6.0)

## 2019-07-11 LAB — IRON + BINDING CAPACITY + %SAT+ FERRITIN
Lab: 22 ng/mL (ref 10–200)
Lab: 35 ug/dL — ABNORMAL LOW (ref 50–160)
Lab: 551 ug/dL — ABNORMAL HIGH (ref 270–380)
Lab: 6 % — ABNORMAL LOW (ref 28–42)

## 2019-07-11 LAB — COMPREHENSIVE METABOLIC PANEL
Lab: 135 MMOL/L — ABNORMAL LOW (ref 137–147)
Lab: 4 MMOL/L (ref 3.5–5.1)

## 2019-07-11 LAB — 25-OH VITAMIN D (D2 + D3): Lab: 53 ng/mL (ref 30–80)

## 2019-07-11 LAB — CA19.9: Lab: 47 U/mL — ABNORMAL HIGH (ref <35)

## 2019-07-11 LAB — PROTIME INR (PT): Lab: 1.3 — ABNORMAL HIGH (ref 0.8–1.2)

## 2019-07-11 MED ORDER — IRON DEXTRAN IVPB
25 mg | Freq: Once | INTRAVENOUS | 0 refills | Status: CN
Start: 2019-07-11 — End: ?

## 2019-07-11 MED ORDER — ACETAMINOPHEN 500 MG PO TAB
500 mg | Freq: Once | ORAL | 0 refills | Status: CN
Start: 2019-07-11 — End: ?

## 2019-07-11 MED ORDER — PRIOR AUTHORIZATION PLACEHOLDER (DO NOT RELEASE)
Freq: Once | 0 refills | Status: CN
Start: 2019-07-11 — End: ?

## 2019-07-11 MED ORDER — DIPHENHYDRAMINE HCL 25 MG PO CAP
25 mg | Freq: Once | ORAL | 0 refills | Status: CN
Start: 2019-07-11 — End: ?

## 2019-07-11 MED ORDER — IRON DEXTRAN IVPB
975 mg | Freq: Once | INTRAVENOUS | 0 refills | Status: CN
Start: 2019-07-11 — End: ?

## 2019-07-13 ENCOUNTER — Encounter: Admit: 2019-07-13 | Discharge: 2019-07-13

## 2019-07-13 DIAGNOSIS — D61818 Other pancytopenia: Secondary | ICD-10-CM

## 2019-07-13 LAB — BONE ALKALINE PHOSPHATASE: Lab: 25 % (ref 41–77)

## 2019-07-13 NOTE — Telephone Encounter
Spoke with patient by phone to review plan of care.  Patient reports she does feel fatigued and did experience dizziness earlier today.  Advised patient to seek immediate attention in the emergency department if symptoms worsen.  Patient to meet with me at 8 AM for lab draw and to sign consent form and will then go for blood transfusion at 8:30 AM.  Patient verbalized understanding, no further questions at this time.

## 2019-07-13 NOTE — Telephone Encounter
Received message from Garner Nash NP that pt needs blood transfusion. Order placed. Messaged sent to infusion clinic. Once they let me know when pt can come I will notify pt.

## 2019-07-13 NOTE — Telephone Encounter
Pt v/u to arrive at [facility] @ 0800 (lab appt made) for CBC and Crossmatch and consent.  Then head up to infusion in heart center @ 0830.  Pt v/u.  Notified Jessica taggart NP.

## 2019-07-14 ENCOUNTER — Ambulatory Visit: Admit: 2019-07-14 | Discharge: 2019-07-15

## 2019-07-14 ENCOUNTER — Encounter: Admit: 2019-07-14 | Discharge: 2019-07-14

## 2019-07-14 ENCOUNTER — Ambulatory Visit: Admit: 2019-07-14 | Discharge: 2019-07-14

## 2019-07-14 DIAGNOSIS — D61818 Other pancytopenia: Principal | ICD-10-CM

## 2019-07-14 LAB — CBC
Lab: 16 % — ABNORMAL HIGH (ref 11–15)
Lab: 2.5 M/UL — ABNORMAL LOW (ref 4.0–5.0)
Lab: 21 % — ABNORMAL LOW (ref 36–45)
Lab: 25 pg — ABNORMAL LOW (ref 26–34)
Lab: 263 10*3/uL (ref 150–400)
Lab: 30 g/dL — ABNORMAL LOW (ref 32.0–36.0)
Lab: 6.2 10*3/uL (ref 4.5–11.0)
Lab: 6.5 g/dL — ABNORMAL LOW (ref 12.0–15.0)
Lab: 8.5 FL (ref 7–11)
Lab: 83 FL (ref 80–100)

## 2019-07-14 LAB — VITAMIN A: Lab: 47

## 2019-07-14 NOTE — Patient Instructions
HEART FAILURE INFUSION CLINIC  OUTPATIENT POST TRANSFUSION INSTRUCTIONS     During your transfusion you were monitored by nursing staff for signs of a transfusion reaction, however, you should continue to observe for the following symptoms after you have been dismissed from the clinic:     FEVER OF 100.5 OR HIGHER  CHILLS  NAUSEA OR VOMITING  FEELING FAINT OR DIZZY  SHORTNESS OF BREATH  DARK OR RED COLORED URINE  CHEST OR BACK PAIN  SHOCK OR LOSS OF CONSCIOUSNESS  YELLOWING OF THE EYES OR SKIN     If you or your caregiver notice any of these symptoms, contact your ordering physician immediately and go to the nearest Emergency Department for treatment. When you arrive at the Emergency Department notify them that you recently received a blood transfusion. *For more detailed signs and symptoms of a transfusion reaction, please refer to the [place] education information below.          When You Need a Blood Transfusion (Adult)  A blood transfusion may be done when you have lost blood because of an injury or during surgery. It can also be done because of diseases or conditions that affect the blood. Blood is made up of several different parts (blood products). You may receive some or all of these blood products during a transfusion. Blood for transfusion is usually donated from another person (donor). Strict measures are taken to make sure that donated blood is safe before it's given to you. This sheet helps you understand how a blood transfusion is done. Your healthcare provider will discuss your condition with you and answer your questions.   The parts of blood  Blood can be broken down into different parts that perform special roles in the body. These parts include:  ? Red blood cells, which carry oxygen throughout the body.  ? Platelets, which help stop bleeding.  ? Plasma (the liquid part of blood), which carries red blood cells and platelets throughout the body. Plasma also helps platelets in stopping bleeding. Where does donated blood come from?  ? Volunteer donors. These are people who donate their blood to help others in need of blood. Blood donation can take place at several places, including a hospital, blood bank, or during a blood drive.  ? Directed donation. If you need a blood transfusion during a planned surgery, family and friends can have their blood tested for compatibility and donate blood for you before the surgery. This needs to be done at least 7 day(s) in advance. This is because the blood must be tested for safety.  ? Autologous donation. This is also called self-donation. For planned surgery, you can donate your own blood starting up to 6 weeks before surgery.  Are blood transfusions safe?  Donated blood is tested and processed to make sure that the blood is safe:  ? The health and medical history of each donor is carefully screened. If a person is considered high-risk for infection or problems, he or she isn't accepted as a blood donor.  ? Donated blood is tested for infections such as hepatitis, syphilis, and HIV (the virus that causes AIDS). If the tested blood is found to be unsafe, it's destroyed.  ? Blood is divided into four types: A, B, AB, and O. Blood also has Rh types: positive (+) and negative (-). You can only receive blood products that are compatible with (match) your blood type. A sample of your blood is tested for compatibility with donated blood. This is  done before blood products are prepared for a transfusion.  How is a blood transfusion done?  A blood transfusion takes place in a blood center, infusion center, hospital room, or operating room. Your healthcare provider will discuss the blood transfusion with you before it's done. You'll need to give permission for the blood transfusion by signing a consent form.  ? Two healthcare providers confirm your identity. They also confirm that they have the correct blood product(s) for you. ? An intravenous (IV) line is placed in a vein if you do not already have an IV.  ? The blood product comes in a plastic bag that is hung on an IV pole. The blood product flows from the bag into your IV line. The IV line may be connected to a pump, which controls the transfusion rate. You may receive more than one kind of blood product through the IV.  ? Your vital signs (blood pressure, heart rate, respiratory rate, and temperature) are checked throughout the transfusion. This is to make sure you are not having a reaction to the blood product.  ? The IV line may be removed once the transfusion is complete.  Possible risks and complications of blood transfusions  Most transfusions are problem free. In some cases, reactions occur. These can happen within seconds to minutes during the transfusion or a week to a few months after the transfusion. Call your doctor or nurse right away if you have any of the signs or symptoms in the table below during or after a transfusion:  Reaction Timing Symptoms   Allergic reaction (mild) ? Within seconds to minutes during the transfusion  ? Up to 24 hours after the transfusion Hives or red welts on the skin, mild itching, rash, localized swelling, flushing (red face), wheezing, shortness of breath, or stridor (high-pitched noise or sound)   Anaphylactic reaction ? Within seconds to minutes during the transfusion  ? Up to 24 hours after the transfusion Shortness of breath, flushing (red face), wheezing, labored (working hard) breathing, low blood pressure, localized swelling, chest tightness, or cramps   Febrile nonhemolytic reaction ? Within minutes to hours during the transfusion  ? Within a few hours to 24 hours after the transfusion Fever (increase of 1??? C or higher), chills, flushing (red face), nausea, headache, minor discomfort, or mild shortness of breath   Acute immune hemolytic reaction ? Within minutes during the transfusion ? Up to 24 hours after the transfusion Fever, red or brown urine, back pain, fast heart rate (tachycardia), abdominal pain, low blood pressure, feeling anxious, chills, chest pain, nausea, or fainting spells   Transfusion-related acute lung injury (TRALI) ? Within 1 to 2 hours during the transfusion  ? Up to 6 hours after the transfusion Shortness of breath, trouble breathing, low blood pressure, fever, pulmonary edema   Transfusion-associated circulatory overload ? Near the end of the transfusion  ? Within 6 hours after the transfusion Shortness of breath, fast heart rate (tachycardia), problems breathing when lying on back, abnormal blood pressure   Post-transfusion purpura (PUP) ? Within 1 week  ? Up to 48 days after the transfusion Purple spots on skin; nose bleed; bleeding from the urinary tract, abdomen, colon, or rectum; fever; or chills     Delayed transfusion-related acute lung injury (TRALI) ? Within 72 hours (3 days) after the transfusion Sudden onset of respiratory distress or trouble breathing   Delayed hemolytic reaction ? Within 3 to 7 days  ? Up to weeks after the transfusion Low-grade  fever, mild jaundice (yellowing of the skin and whites of the eyes), decrease in hematocrit, chills, chest pain, back pain, nausea   ??? 2000-2019 [employer], [place]. [address] [place], [place] [address]. All rights reserved. This information is not intended as a substitute for professional medical care. Always follow your healthcare professional's instructions.

## 2019-07-14 NOTE — Progress Notes
Patient tolerated blood transfusion well, no reaction noted. RN reviewed Outpatient Post Transfusion Instructions printed in AVS with patient. Patient verbalized understanding and has no further questions or concerns at this time. Patient ambulated out of infusion clinic.

## 2019-07-15 LAB — ZINC: Lab: 0.5 — ABNORMAL LOW

## 2019-07-16 ENCOUNTER — Encounter: Admit: 2019-07-16 | Discharge: 2019-07-16

## 2019-07-16 DIAGNOSIS — R799 Abnormal finding of blood chemistry, unspecified: Secondary | ICD-10-CM

## 2019-07-16 DIAGNOSIS — D61818 Other pancytopenia: Secondary | ICD-10-CM

## 2019-07-16 DIAGNOSIS — D500 Iron deficiency anemia secondary to blood loss (chronic): Secondary | ICD-10-CM

## 2019-07-16 NOTE — Telephone Encounter
LVM for pt to call back to discuss vitamin A.

## 2019-07-16 NOTE — Telephone Encounter
Call from [facility] regarding refill of vitamin A.  She cannot obtain locally.  Will be here tomorrow and would like to pick up here if possible.  Please return her call.

## 2019-07-16 NOTE — Progress Notes
Repeat lab orders generated and faxed to [facility]. [facility] LPN II.

## 2019-07-17 ENCOUNTER — Encounter: Admit: 2019-07-17 | Discharge: 2019-07-17

## 2019-07-17 DIAGNOSIS — K7030 Alcoholic cirrhosis of liver without ascites: Secondary | ICD-10-CM

## 2019-07-17 DIAGNOSIS — K31819 Angiodysplasia of stomach and duodenum without bleeding: Secondary | ICD-10-CM

## 2019-07-17 DIAGNOSIS — Z713 Dietary counseling and surveillance: Secondary | ICD-10-CM

## 2019-07-17 DIAGNOSIS — K7581 Nonalcoholic steatohepatitis (NASH): Secondary | ICD-10-CM

## 2019-07-17 DIAGNOSIS — R601 Generalized edema: Secondary | ICD-10-CM

## 2019-07-17 NOTE — Telephone Encounter
Returned call to pt informing her that vitamin A has been replaced and the amount in a multivitamin is sufficient.  Recommended that pt start taking multivitamin. Pt verbalized understanding.

## 2019-07-17 NOTE — Telephone Encounter
Patient returning your call about her vitamin A injection.  Please call her again

## 2019-07-18 ENCOUNTER — Ambulatory Visit: Admit: 2019-07-17 | Discharge: 2019-07-18

## 2019-07-18 DIAGNOSIS — K7460 Unspecified cirrhosis of liver: Secondary | ICD-10-CM

## 2019-07-18 DIAGNOSIS — K2210 Ulcer of esophagus without bleeding: Secondary | ICD-10-CM

## 2019-07-18 DIAGNOSIS — D500 Iron deficiency anemia secondary to blood loss (chronic): Secondary | ICD-10-CM

## 2019-07-18 DIAGNOSIS — R188 Other ascites: Secondary | ICD-10-CM

## 2019-07-18 DIAGNOSIS — I728 Aneurysm of other specified arteries: Secondary | ICD-10-CM

## 2019-07-18 DIAGNOSIS — I8511 Secondary esophageal varices with bleeding: Secondary | ICD-10-CM

## 2019-07-18 DIAGNOSIS — E669 Obesity, unspecified: Secondary | ICD-10-CM

## 2019-07-22 ENCOUNTER — Encounter: Admit: 2019-07-22 | Discharge: 2019-07-22

## 2019-07-22 NOTE — Committee Review
Patient Discussion Note     Organ being evaluated for: Liver    Transplant Coordinator: Kathryne Eriksson  Transplant Surgeon:       Transplant Physician:     Committee Review Members:  Dietician, Registered Deeann Cree, [place], [facility]   Pharmacist Dorothea Glassman, [place]   Psychiatry Polly Cobia May, MD   Social Worker, Clinical [facility]   Transplant Hepatology Lisabeth Pick, RN, Sherilyn Banker, MD, Leanora Cover, RN, Retta Mac, MD, Romana Juniper, RN, Marquita Palms, MD, Felicita Gage, MD, Kerrin Champagne, RN, Doyce Loose, RN, Kathryne Eriksson, RN   Transplant Services [facility] [place]   Transplant Surgery Velda Shell, MD       Reason for Discussion:     Discussion Summary:    Discussion Notes and Findings: 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  Pt has seen vascular surgery and had coiling performed, is scheduled for follow up imaging in October.  Dr. Lovena Le to review case with Dr. Mariam Dollar and if there are no transplant surgery concerns the team approves pt for listing, but she will remain deferred until then.

## 2019-07-22 NOTE — Patient Education
Called and spoke with pt. Reviewed instructions for patients EGD and she verbalized understanding. Instructions sent to pt through her My Chart as requested. Covid nasal swab ordered and scheduled.  EGD (ESOPHAGOGASTRODUODENOSCOPY) PREP      Upper GI endoscopy allows healthcare providers to look directly into the beginning of your gastrointestinal(GI) tract.  The esophagus, stomach, and duodenum (first part of the small intestine) make up the upper GI tract.      5 Days Prior:  1. Check with your prescribing physician for instructions about stopping your blood thinner.  Examples of blood thinners are Aleve, Aspirin. Coumadin, Eliquis, Ibuprofen, Naproxen, Plavix, and Xarelto.  2. Do not give yourself a Lovenox injection the morning of the test. Lovenox injections may be taken as usual through the day before your test.    Day of Exam:  1. Do not eat or drink anything after midnight the night before your exam. However, if your exam is in the afternoon you may drink clear liquids only up until (4) hours before your scheduled procedure time.  After this, you should have nothing by mouth.  This includes GUM or CANDY.   a. Chewing tobacco must be stopped  (6) hours before your scheduled procedure.   b. If you have an early morning test, take ONLY your essential morning medications (heart, blood pressure, seizure, etc.) with a small sip of water.   c. You will be sedated for the procedure. A responsible adult must drive you home (no Benedetto Goad, taxis, or buses are permitted). If you do not have a driver we will be unable to do the test.   d. You will be here for (3-4) hours from arrival time.   e. You will not be able to return to work the same day.   f. Please bring a list of your current medications and the dosages with you.     The Procedure:  ? You will lie on the endoscopy table. Usually patients lie on the left side.  ? You will be monitored and given oxygen. ? You are given sedation (relaxing) medication through an intravenous (IV) line.  ? The healthcare provider will put the endoscope in your mouth and down your esophagus. It is thinner than most pieces of food that you swallow.  It will not affect your breathing. The medicine helps keep you from gagging.   ? Air is inserted to expand your GI tract. It can make you burp.  ? During the procedure, the healthcare provider can take biopsies (tissue samples), remove abnormalities such as polyps, or treat abnormalities though a variety of devices placed through the endoscope. You will not feel this.   ? The endoscope carries images of your upper GI tract to a video screen.  ? An adult must drive you home.

## 2019-07-23 ENCOUNTER — Encounter: Admit: 2019-07-23 | Discharge: 2019-07-23

## 2019-07-23 DIAGNOSIS — K760 Fatty (change of) liver, not elsewhere classified: Secondary | ICD-10-CM

## 2019-07-23 NOTE — Telephone Encounter
RN discussed committee discussion with patient. 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      Dr. Lovena Le discussed with Dr. Mariam Dollar. Plan to have patient f/u imaging moved up from original schedule date of October. Patient scheduled for CTA 9/4.    Patient advised imaging will be reviewed tomorrow to determine listing plan, she v/u.       Patient listing labs expired tomorrow. patient advised to have new labs drawn at CT for listing, she v/u

## 2019-07-23 NOTE — Telephone Encounter
1:06 VM from [place] asking for a return call.  She has a question.

## 2019-07-23 NOTE — Telephone Encounter
patient reports yeast infection and requests fluconazole for treatment from PCP who advised her to contact our office for approval of this medication. RN completed medication drug interaction check via up-to-date without any drug interaction concerns. Discussed with APRN who approves medication given short therapy plan.

## 2019-07-24 ENCOUNTER — Encounter: Admit: 2019-07-24 | Discharge: 2019-07-24

## 2019-07-24 ENCOUNTER — Ambulatory Visit: Admit: 2019-07-24 | Discharge: 2019-07-25

## 2019-07-24 LAB — CBC AND DIFF
Lab: 3 M/UL — ABNORMAL LOW (ref 4.0–5.0)
Lab: 5 10*3/uL (ref 4.5–11.0)

## 2019-07-24 LAB — COMPREHENSIVE METABOLIC PANEL
Lab: 1.1 mg/dL (ref 0.3–1.2)
Lab: 1.2 mg/dL — ABNORMAL HIGH (ref 0.4–1.00)
Lab: 135 MMOL/L — ABNORMAL LOW (ref 137–147)
Lab: 142 mg/dL — ABNORMAL HIGH (ref >60)
Lab: 160 U/L — ABNORMAL HIGH (ref 25–110)
Lab: 23 mg/dL — ABNORMAL LOW (ref >60)
Lab: 24 U/L (ref 7–56)
Lab: 25 MMOL/L (ref 21–30)
Lab: 3 g/dL — ABNORMAL LOW (ref 3.5–5.0)
Lab: 4.1 MMOL/L — ABNORMAL LOW (ref 3.5–5.1)
Lab: 42 U/L — ABNORMAL HIGH (ref 7–40)
Lab: 6.4 g/dL (ref 6.0–8.0)
Lab: 8.9 mg/dL (ref 8.5–10.6)

## 2019-07-24 LAB — PROTIME INR (PT): Lab: 1.3 MMOL/L — ABNORMAL HIGH (ref 0.8–1.2)

## 2019-07-24 MED ORDER — SODIUM CHLORIDE 0.9 % IJ SOLN
50 mL | Freq: Once | INTRAVENOUS | 0 refills | Status: CP
Start: 2019-07-24 — End: ?
  Administered 2019-07-24: 16:00:00 50 mL via INTRAVENOUS

## 2019-07-24 MED ORDER — DIPHENHYDRAMINE HCL 25 MG PO CAP
50 mg | Freq: Once | ORAL | 0 refills | Status: CP
Start: 2019-07-24 — End: ?
  Administered 2019-07-24: 15:00:00 50 mg via ORAL

## 2019-07-24 MED ORDER — IOHEXOL 350 MG IODINE/ML IV SOLN
80 mL | Freq: Once | INTRAVENOUS | 0 refills | Status: CP
Start: 2019-07-24 — End: ?
  Administered 2019-07-24: 16:00:00 80 mL via INTRAVENOUS

## 2019-07-25 DIAGNOSIS — K760 Fatty (change of) liver, not elsewhere classified: Secondary | ICD-10-CM

## 2019-07-25 DIAGNOSIS — I728 Aneurysm of other specified arteries: Principal | ICD-10-CM

## 2019-07-26 ENCOUNTER — Encounter: Admit: 2019-07-26 | Discharge: 2019-07-26

## 2019-07-28 MED ORDER — FUROSEMIDE 40 MG PO TAB
ORAL_TABLET | Freq: Every day | ORAL | 1 refills | 90.00000 days | Status: DC
Start: 2019-07-28 — End: 2019-08-14

## 2019-07-30 ENCOUNTER — Encounter: Admit: 2019-07-30 | Discharge: 2019-07-30

## 2019-07-31 NOTE — Progress Notes
Pt informed of results and recommendations via MyChart. Pt encouraged to contact this office with any questions or concerns. [facility], LPN II.

## 2019-08-03 ENCOUNTER — Encounter: Admit: 2019-08-03 | Discharge: 2019-08-03 | Payer: MEDICARE

## 2019-08-04 ENCOUNTER — Encounter: Admit: 2019-08-04 | Discharge: 2019-08-05 | Payer: MEDICARE

## 2019-08-04 ENCOUNTER — Encounter: Admit: 2019-08-04 | Discharge: 2019-08-04 | Payer: MEDICARE

## 2019-08-05 ENCOUNTER — Encounter: Admit: 2019-08-05 | Discharge: 2019-08-05 | Payer: MEDICARE

## 2019-08-05 LAB — COVID-19 (SARS-COV-2) PCR

## 2019-08-05 MED ORDER — ACETAMINOPHEN 500 MG PO TAB
500 mg | Freq: Once | ORAL | 0 refills | Status: CN
Start: 2019-08-05 — End: ?

## 2019-08-05 MED ORDER — IRON DEXTRAN IVPB
25 mg | Freq: Once | INTRAVENOUS | 0 refills | Status: CP
Start: 2019-08-05 — End: ?
  Administered 2019-08-06 (×2): 25 mg via INTRAVENOUS

## 2019-08-05 MED ORDER — IRON DEXTRAN IVPB
25 mg | Freq: Once | INTRAVENOUS | 0 refills | Status: CN
Start: 2019-08-05 — End: ?

## 2019-08-05 MED ORDER — IRON DEXTRAN IVPB
975 mg | Freq: Once | INTRAVENOUS | 0 refills | Status: CN
Start: 2019-08-05 — End: ?

## 2019-08-05 MED ORDER — DIPHENHYDRAMINE HCL 25 MG PO CAP
25 mg | Freq: Once | ORAL | 0 refills | Status: CN
Start: 2019-08-05 — End: ?

## 2019-08-06 ENCOUNTER — Encounter: Admit: 2019-08-06 | Discharge: 2019-08-06 | Payer: MEDICARE

## 2019-08-06 ENCOUNTER — Ambulatory Visit: Admit: 2019-08-06 | Discharge: 2019-08-07 | Payer: MEDICARE

## 2019-08-06 ENCOUNTER — Ambulatory Visit: Admit: 2019-08-06 | Discharge: 2019-08-06 | Payer: MEDICARE

## 2019-08-06 MED ORDER — ACETAMINOPHEN 500 MG PO TAB
500 mg | Freq: Once | ORAL | 0 refills | Status: DC
Start: 2019-08-06 — End: 2019-08-06

## 2019-08-06 MED ORDER — DIPHENHYDRAMINE HCL 25 MG PO CAP
25 mg | Freq: Once | ORAL | 0 refills | Status: CP
Start: 2019-08-06 — End: ?
  Administered 2019-08-06: 14:00:00 25 mg via ORAL

## 2019-08-06 MED ORDER — IRON DEXTRAN IVPB
975 mg | Freq: Once | INTRAVENOUS | 0 refills | Status: CP
Start: 2019-08-06 — End: ?
  Administered 2019-08-06 (×2): 975 mg via INTRAVENOUS

## 2019-08-06 NOTE — Patient Instructions
Iron Dextran injection  Brand Name: INFeD  What is this medicine?  IRON DEXTRAN (AHY ern DEX tran) is an iron complex. Iron is used to make healthy red blood cells, which carry oxygen and nutrients through the body. This medicine is used to treat people who cannot take iron by mouth and have low levels of iron in the blood.  How should I use this medicine?  This medicine is for injection into a vein or a muscle. It is given by a health care professional in a hospital or clinic setting.  Talk to your pediatrician regarding the use of this medicine in children. While this drug may be prescribed for children as young as 4 months old for selected conditions, precautions do apply.  What side effects may I notice from receiving this medicine?  Side effects that you should report to your doctor or health care professional as soon as possible:  · allergic reactions like skin rash, itching or hives, swelling of the face, lips, or tongue  · blue lips, nails, or skin  · breathing problems  · changes in blood pressure  · chest pain  · confusion  · fast, irregular heartbeat  · feeling faint or lightheaded, falls  · fever or chills  · flushing, sweating, or hot feelings  · joint or muscle aches or pains  · pain, tingling, numbness in the hands or feet  · seizures  · unusually weak or tired  Side effects that usually do not require medical attention (report to your doctor or health care professional if they continue or are bothersome):  · change in taste (metallic taste)  · diarrhea  · headache  · irritation at site where injected  · nausea, vomiting  · stomach upset  What may interact with this medicine?  Do not take this medicine with any of the following medications:  · deferoxamine  · dimercaprol  · other iron products  This medicine may also interact with the following medications:  · chloramphenicol  · deferasirox  What if I miss a dose?  It is important not to miss your dose. Call your doctor or health care professional  if you are unable to keep an appointment.  Where should I keep my medicine?  This drug is given in a hospital or clinic and will not be stored at home.  What should I tell my health care provider before I take this medicine?  They need to know if you have any of these conditions:  · anemia not caused by low iron levels  · heart disease  · high levels of iron in the blood  · kidney disease  · liver disease  · an unusual or allergic reaction to iron, other medicines, foods, dyes, or preservatives  · pregnant or trying to get pregnant  · breast-feeding  What should I watch for while using this medicine?  Visit your doctor or health care professional regularly. Tell your doctor if your symptoms do not start to get better or if they get worse. You may need blood work done while you are taking this medicine.  You may need to follow a special diet. Talk to your doctor. Foods that contain iron include: whole grains/cereals, dried fruits, beans, or peas, leafy green vegetables, and organ meats (liver, kidney).  Long-term use of this medicine may increase your risk of some cancers. Talk to your doctor about how to limit your risk.  NOTE:This sheet is a summary. It may not cover all   possible information. If you have questions about this medicine, talk to your doctor, pharmacist, or health care provider. Copyright© 2020 [employer]

## 2019-08-07 ENCOUNTER — Encounter: Admit: 2019-08-07 | Discharge: 2019-08-07 | Payer: MEDICARE

## 2019-08-07 ENCOUNTER — Ambulatory Visit: Admit: 2019-08-07 | Discharge: 2019-08-07 | Payer: MEDICARE

## 2019-08-07 LAB — POC GLUCOSE: Lab: 119 mg/dL — ABNORMAL HIGH (ref 70–100)

## 2019-08-07 MED ORDER — HALOPERIDOL LACTATE 5 MG/ML IJ SOLN
1 mg | Freq: Once | INTRAVENOUS | 0 refills | Status: CN | PRN
Start: 2019-08-07 — End: ?

## 2019-08-07 MED ORDER — LACTATED RINGERS IV SOLP
0 refills | Status: DC
Start: 2019-08-07 — End: 2019-08-07
  Administered 2019-08-07: 14:00:00 via INTRAVENOUS

## 2019-08-07 MED ORDER — ONDANSETRON HCL (PF) 4 MG/2 ML IJ SOLN
4 mg | Freq: Once | INTRAVENOUS | 0 refills | Status: CN | PRN
Start: 2019-08-07 — End: ?

## 2019-08-07 MED ORDER — LACTATED RINGERS IV SOLP
1000 mL | Freq: Once | INTRAVENOUS | 0 refills | Status: CP
Start: 2019-08-07 — End: ?
  Administered 2019-08-07: 13:00:00 1000 mL via INTRAVENOUS

## 2019-08-07 MED ORDER — FENTANYL CITRATE (PF) 50 MCG/ML IJ SOLN
25-50 ug | INTRAVENOUS | 0 refills | Status: CN | PRN
Start: 2019-08-07 — End: ?

## 2019-08-07 MED ORDER — LIDOCAINE (PF) 200 MG/10 ML (2 %) IJ SYRG
0 refills | Status: DC
Start: 2019-08-07 — End: 2019-08-07
  Administered 2019-08-07: 15:00:00 40 mg via INTRAVENOUS

## 2019-08-07 MED ORDER — PROPOFOL 10 MG/ML IV EMUL 20 ML (INFUSION)(AM)(OR)
INTRAVENOUS | 0 refills | Status: DC
Start: 2019-08-07 — End: 2019-08-07
  Administered 2019-08-07: 15:00:00 140 ug/kg/min via INTRAVENOUS

## 2019-08-07 MED ORDER — PROPOFOL INJ 10 MG/ML IV VIAL
0 refills | Status: DC
Start: 2019-08-07 — End: 2019-08-07
  Administered 2019-08-07: 15:00:00 10 mg via INTRAVENOUS
  Administered 2019-08-07 (×2): 50 mg via INTRAVENOUS

## 2019-08-08 ENCOUNTER — Encounter: Admit: 2019-08-08 | Discharge: 2019-08-08 | Payer: MEDICARE

## 2019-08-09 ENCOUNTER — Encounter: Admit: 2019-08-09 | Discharge: 2019-08-09 | Payer: MEDICARE

## 2019-08-10 ENCOUNTER — Encounter: Admit: 2019-08-10 | Discharge: 2019-08-10 | Payer: MEDICARE

## 2019-08-10 ENCOUNTER — Ambulatory Visit: Admit: 2019-08-10 | Discharge: 2019-08-10 | Payer: MEDICARE

## 2019-08-10 NOTE — Telephone Encounter
RN LVM for patient to return call, patient officially listed in [facility] for liver transplant as ACTIVE on Friday 08/07/2019.  Letter created and sent, EOC updated.      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 Standing lab orders at [facility].      Referral placed for repeat EGD

## 2019-08-11 ENCOUNTER — Encounter: Admit: 2019-08-11 | Discharge: 2019-08-11 | Payer: MEDICARE

## 2019-08-11 ENCOUNTER — Ambulatory Visit: Admit: 2019-08-11 | Discharge: 2019-08-11 | Payer: MEDICARE

## 2019-08-11 LAB — VITAMIN B12: Lab: 173 pg/mL — ABNORMAL HIGH (ref 180–914)

## 2019-08-12 ENCOUNTER — Encounter: Admit: 2019-08-12 | Discharge: 2019-08-12 | Payer: MEDICARE

## 2019-08-12 ENCOUNTER — Inpatient Hospital Stay: Admit: 2019-08-12 | Discharge: 2019-08-14 | Disposition: A | Discharge status: Home / Self Care | Payer: MEDICARE

## 2019-08-12 ENCOUNTER — Emergency Department: Admit: 2019-08-12 | Discharge: 2019-08-12 | Payer: MEDICARE

## 2019-08-12 LAB — COMPREHENSIVE METABOLIC PANEL
Lab: 1.1 mg/dL (ref 0.3–1.2)
Lab: 102 MMOL/L — ABNORMAL LOW (ref 98–110)
Lab: 103 mg/dL — ABNORMAL HIGH (ref 70–100)
Lab: 13 10*3/uL — ABNORMAL HIGH (ref 3–12)
Lab: 137 MMOL/L — ABNORMAL LOW (ref 137–147)
Lab: 164 U/L — ABNORMAL HIGH (ref 25–110)
Lab: 22 MMOL/L (ref 21–30)
Lab: 28 U/L (ref 7–56)
Lab: 3.3 g/dL — ABNORMAL LOW (ref 3.5–5.0)
Lab: 3.8 MMOL/L — ABNORMAL LOW (ref 3.5–5.1)
Lab: 34 mg/dL — ABNORMAL HIGH (ref 7–25)
Lab: 36 mL/min — ABNORMAL LOW (ref >60)
Lab: 44 mL/min — ABNORMAL LOW (ref >60)

## 2019-08-12 LAB — PROTIME INR (PT): Lab: 1.2 mg/dL — ABNORMAL HIGH (ref 0.8–1.2)

## 2019-08-12 LAB — CBC AND DIFF
Lab: 0.1 10*3/uL (ref 0–0.20)
Lab: 0.1 10*3/uL (ref 0–0.45)
Lab: 7.5 10*3/uL (ref 4.5–11.0)
Lab: 18 (ref <20.7)

## 2019-08-12 LAB — AMMONIA: Lab: 61 umol/L — ABNORMAL HIGH (ref 9–35)

## 2019-08-12 LAB — PTT (APTT): Lab: 29 s (ref 24.0–36.5)

## 2019-08-12 LAB — TSH WITH FREE T4 REFLEX: Lab: 2.1 uU/mL (ref 0.35–5.00)

## 2019-08-12 LAB — POC LACTATE: Lab: 2.1 MMOL/L — ABNORMAL HIGH (ref 0.5–2.0)

## 2019-08-12 LAB — TROPONIN-I: Lab: 0 ng/mL — ABNORMAL HIGH (ref 0.0–0.05)

## 2019-08-12 MED ORDER — FUROSEMIDE 40 MG PO TAB
80 mg | Freq: Every day | ORAL | 0 refills | Status: DC
Start: 2019-08-12 — End: 2019-08-13
  Administered 2019-08-13: 14:00:00 80 mg via ORAL

## 2019-08-12 MED ORDER — SPIRONOLACTONE 100 MG PO TAB
100 mg | Freq: Every day | ORAL | 0 refills | Status: DC
Start: 2019-08-12 — End: 2019-08-13
  Administered 2019-08-13: 14:00:00 100 mg via ORAL

## 2019-08-12 MED ORDER — HEPARIN, PORCINE (PF) 5,000 UNIT/0.5 ML IJ SYRG
5000 [IU] | SUBCUTANEOUS | 0 refills | Status: DC
Start: 2019-08-12 — End: 2019-08-14
  Administered 2019-08-13 – 2019-08-14 (×4): 5000 [IU] via SUBCUTANEOUS

## 2019-08-12 MED ORDER — RIFAXIMIN 550 MG PO TAB
550 mg | Freq: Two times a day (BID) | ORAL | 0 refills | Status: DC
Start: 2019-08-12 — End: 2019-08-14
  Administered 2019-08-13 – 2019-08-14 (×4): 550 mg via ORAL

## 2019-08-12 MED ORDER — LACTULOSE 10 GRAM/15 ML PO SOLN WRAPPER
30 mL | Freq: Three times a day (TID) | ORAL | 0 refills | Status: DC
Start: 2019-08-12 — End: 2019-08-13
  Administered 2019-08-13 (×2): 20 g via ORAL

## 2019-08-12 MED ORDER — SODIUM CHLORIDE 0.9 % IV SOLP
500 mL | Freq: Once | INTRAVENOUS | 0 refills | Status: CP
Start: 2019-08-12 — End: ?
  Administered 2019-08-13: 02:00:00 500 mL via INTRAVENOUS

## 2019-08-12 NOTE — Telephone Encounter
Outgoing return call. Patient answered the phone and RN asked to speak with Dominica Severin [facility]). Patient stated that he wasn't home at the moment and he called for her. Patient states that she has been disoriented and very confused for the past 2 days and also had a nosebleed yesterday that she reportedly does not remember. RN could hear man's voice in the background of conversation talking with patient. With recent GAVE/iron transfusion/multiple comorbids. Advised patient to go to [facility] ER with further evaluation and treatment. She states she has someone home with her at the moment that can give her a ride there. They will call clinic with any further questions or concerns and head to ER ASAP.Routing to [employer]

## 2019-08-12 NOTE — ED Notes
Pt c/c confusion X 2 days. With with Hx of cirrhosis and waiting liver Tx. Pt states for last 2 days she has felt confused and having difficulty concentrating. Pt's husband states that pt has had low hemoglobin in the past and required blood transfusion. Pt A&O X 3, denies abd pain or N/V. Pt denies any blood in stool. Pt placed on monitor, MD at bedside.

## 2019-08-12 NOTE — Telephone Encounter
Call from husband stating that Kristin Moody has been very disoriented since her iron infusion last week.  Please return his call.

## 2019-08-13 ENCOUNTER — Inpatient Hospital Stay: Admit: 2019-08-13 | Discharge: 2019-08-13 | Payer: MEDICARE

## 2019-08-13 ENCOUNTER — Encounter: Admit: 2019-08-13 | Discharge: 2019-08-13 | Payer: MEDICARE

## 2019-08-13 LAB — IRON + BINDING CAPACITY + %SAT+ FERRITIN
Lab: 109 ug/dL (ref 50–160)
Lab: 228 ng/mL — ABNORMAL HIGH (ref 10–200)
Lab: 371 ug/dL — ABNORMAL LOW (ref 270–380)

## 2019-08-13 LAB — URINALYSIS MICROSCOPIC REFLEX TO CULTURE

## 2019-08-13 LAB — COMPREHENSIVE METABOLIC PANEL
Lab: 1 mg/dL (ref 0.3–1.2)
Lab: 1.4 mg/dL — ABNORMAL HIGH (ref 0.4–1.00)
Lab: 105 MMOL/L — ABNORMAL LOW (ref 98–110)
Lab: 123 U/L — ABNORMAL HIGH (ref 25–110)
Lab: 135 MMOL/L — ABNORMAL LOW (ref 137–147)
Lab: 19 MMOL/L — ABNORMAL LOW (ref 21–30)
Lab: 2.8 g/dL — ABNORMAL LOW (ref 3.5–5.0)
Lab: 24 U/L (ref 7–56)
Lab: 240 mg/dL — ABNORMAL HIGH (ref 70–100)
Lab: 33 mg/dL — ABNORMAL HIGH (ref 7–25)
Lab: 36 mL/min — ABNORMAL LOW (ref >60)
Lab: 43 mL/min — ABNORMAL LOW (ref >60)
Lab: 5.8 g/dL — ABNORMAL LOW (ref 6.0–8.0)
Lab: 51 U/L — ABNORMAL HIGH (ref 7–40)
Lab: 11 (ref 3–12)

## 2019-08-13 LAB — UREA NITROGEN-URINE RANDOM: Lab: 600 mg/dL

## 2019-08-13 LAB — URINALYSIS DIPSTICK REFLEX TO CULTURE
Lab: NEGATIVE
Lab: NEGATIVE
Lab: NEGATIVE

## 2019-08-13 LAB — METHYLMALONIC ACID QUANT: Lab: 0.2

## 2019-08-13 LAB — COVID-19 (SARS-COV-2) PCR

## 2019-08-13 LAB — POC TROPONIN: Lab: 0 ng/mL (ref 0.00–0.05)

## 2019-08-13 LAB — CBC: Lab: 5.8 10*3/uL (ref 4.5–11.0)

## 2019-08-13 LAB — FOLATE, SERUM: Lab: >22 ng/mL (ref >3.9)

## 2019-08-13 LAB — POC GLUCOSE: Lab: 148 mg/dL — ABNORMAL HIGH (ref 70–100)

## 2019-08-13 LAB — LACTIC ACID(LACTATE): Lab: 1.8 MMOL/L (ref 0.5–2.0)

## 2019-08-13 LAB — VITAMIN E: Lab: 11

## 2019-08-13 LAB — MAGNESIUM: Lab: 2.3 mg/dL — ABNORMAL LOW (ref 1.6–2.6)

## 2019-08-13 LAB — CREATININE-URINE RANDOM: Lab: 52 mg/dL

## 2019-08-13 LAB — COPPER: Lab: 0.7

## 2019-08-13 MED ORDER — MELATONIN 3 MG PO TAB
3 mg | Freq: Every evening | ORAL | 0 refills | Status: DC | PRN
Start: 2019-08-13 — End: 2019-08-14

## 2019-08-13 MED ORDER — ONDANSETRON HCL (PF) 4 MG/2 ML IJ SOLN
4 mg | INTRAVENOUS | 0 refills | Status: DC | PRN
Start: 2019-08-13 — End: 2019-08-14

## 2019-08-13 MED ORDER — ONDANSETRON HCL 4 MG PO TAB
4 mg | ORAL | 0 refills | Status: DC | PRN
Start: 2019-08-13 — End: 2019-08-14

## 2019-08-13 MED ORDER — BISACODYL 10 MG RE SUPP
10 mg | Freq: Every day | RECTAL | 0 refills | Status: DC | PRN
Start: 2019-08-13 — End: 2019-08-14

## 2019-08-13 MED ORDER — ATORVASTATIN 10 MG PO TAB
10 mg | Freq: Every day | ORAL | 0 refills | Status: DC
Start: 2019-08-13 — End: 2019-08-14
  Administered 2019-08-13 – 2019-08-14 (×2): 10 mg via ORAL

## 2019-08-13 MED ORDER — FLU VACC QS2020-21 6MOS UP(PF) 60 MCG (15 MCG X 4)/0.5 ML IM SYRG
.5 mL | Freq: Once | INTRAMUSCULAR | 0 refills | Status: CP
Start: 2019-08-13 — End: ?
  Administered 2019-08-14: 02:00:00 0.5 mL via INTRAMUSCULAR

## 2019-08-13 MED ORDER — DULOXETINE 60 MG PO CPDR
60 mg | Freq: Every day | ORAL | 0 refills | Status: DC
Start: 2019-08-13 — End: 2019-08-14
  Administered 2019-08-13 – 2019-08-14 (×2): 60 mg via ORAL

## 2019-08-13 MED ORDER — POLYETHYLENE GLYCOL 3350 17 GRAM PO PWPK
1 | Freq: Every day | ORAL | 0 refills | Status: DC | PRN
Start: 2019-08-13 — End: 2019-08-13

## 2019-08-13 MED ORDER — ACETAMINOPHEN 325 MG PO TAB
650 mg | ORAL | 0 refills | Status: DC | PRN
Start: 2019-08-13 — End: 2019-08-14

## 2019-08-13 MED ORDER — LACTULOSE 10 GRAM/15 ML PO SOLN WRAPPER
30 mL | ORAL | 0 refills | Status: DC | PRN
Start: 2019-08-13 — End: 2019-08-14
  Administered 2019-08-13 – 2019-08-14 (×3): 20 g via ORAL

## 2019-08-13 MED ORDER — PANTOPRAZOLE 40 MG PO TBEC
40 mg | Freq: Two times a day (BID) | ORAL | 0 refills | Status: DC
Start: 2019-08-13 — End: 2019-08-14
  Administered 2019-08-13 – 2019-08-14 (×4): 40 mg via ORAL

## 2019-08-13 MED ORDER — POLYETHYLENE GLYCOL 3350 17 GRAM PO PWPK
1 | Freq: Every day | ORAL | 0 refills | Status: DC
Start: 2019-08-13 — End: 2019-08-14
  Administered 2019-08-13 – 2019-08-14 (×2): 17 g via ORAL

## 2019-08-13 NOTE — H&P (View-Only)
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 No stigmata of bleeding. Scarring from porior banding noted. Due to small size and no stigmata banding not performed. Gastric antral vascular ectasia with bleeding  - she denies any overt bleeding, no melena, to suggest the cuase of her encephalopathy  - xray no acute findings, she has no respiratory complaints to suggest pneumonia  - UA pending, if significant pyuria will start on abx, she has no UTI symptoms though  - CT head no acute findings  - so most likely cause of her encephalopathy is not adequate number of BMs, she has 1-2 daily, on miralax only, will add lactulose and rifaximin  - continue PTA lasix and spironolactone  - hepatology consulted    CKD stage 3  - baseline cr <1.5  - presents with Cr of 1.46  - avoid nephrotoxins      ______________________________________________________________________________    Primary Care Physician: Dutch Gray     Chief Complaint: confusion    History of Present Illness: Kristin Moody is a 63 y.o. female with PMH of as below presented to ed with her husband with confusion for 2 days and forgetfulness. 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 Denies any abd pain, no nausea, no vomiting, no overt bleeding, no melena, no falls, no cough, cp, sob, fever and chills. Her CT of head was negative, chest xray was negative too, UA was pending, lactic acid elevated.     Medical History:   Diagnosis Date   ? Aneurysm (HCC)    ? 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 Diverticulosis of colon     descending and sigmoid colon   ? Dyspnea    ? Essential hypertension 08/13/2012   ? Fatty infiltration of liver ? HTN (hypertension)    ? Obesity (BMI 30-39.9) 05/13/2011   ? Preop cardiovascular exam 01/07/2019   ? Sinus infection    ? Type 2 diabetes mellitus (HCC) 09/21/2014     Surgical History:   Procedure Laterality Date   ? HYSTERECTOMY  1994   ? UPPER GASTROINTESTINAL ENDOSCOPY  2009   ? COLONOSCOPY  2009   ? CYSTOCELE REPAIR  2009    with endocele repair   ? RECTOCELE REPAIR  03/2009   ? LIVER BIOPSY  07/17/2010   ? COLONOSCOPY N/A 11/08/2017    Performed by Dawna Part, MD at [facility] ENDO   ? ESOPHAGOGASTRODUODENOSCOPY N/A 11/08/2017    Performed by Dawna Part, MD at [facility] ENDO   ? ESOPHAGOGASTRODUODENOSCOPY WITH BIOPSY - FLEXIBLE  11/08/2017    Performed by Dawna Part, MD at [facility] ENDO   ? COLONOSCOPY WITH HOT BIOPSY FORCEPS REMOVAL TUMOR/ POLYP/ OTHER LESION  11/08/2017    Performed by Dawna Part, MD at [facility] ENDO   ? ESOPHAGOGASTRODUODENOSCOPY  WITH BAND LIGATION ESOPHAGEAL/ GASTRIC VARICES - FLEXIBLE N/A 04/10/2018    Performed by Normajean Baxter, MD at [facility] ENDO   ? ESOPHAGOGASTRODUODENOSCOPY WITH BIOPSY - FLEXIBLE N/A 04/10/2018    Performed by Normajean Baxter, MD at [facility] ENDO   ? ESOPHAGOGASTRODUODENOSCOPY WITH CONTROL OF BLEEDING - FLEXIBLE N/A 04/25/2018    Performed by Jolee Ewing, MD at [facility] ENDO   ? ESOPHAGOGASTRODUODENOSCOPY WITH BIOPSY - FLEXIBLE with push enteroscopy N/A 08/28/2018    Performed by Celesta Gentile, MD at [facility] ENDO   ? EGD N/A 10/27/2018    Performed by Eliott Nine, MD at [facility] ENDO   ? ESOPHAGOGASTRODUODENOSCOPY WITH SNARE REMOVAL TUMOR/ POLYP/ OTHER LESION - FLEXIBLE N/A 10/27/2018    Performed by Eliott Nine, MD at [facility] ENDO   ? ESOPHAGOGASTRODUODENOSCOPY WITH BIOPSY - FLEXIBLE N/A 12/16/2018    Performed by Buckles, Vinnie Level, MD at [facility] ENDO   ? ESOPHAGOGASTRODUODENOSCOPY WITH CONTROL OF BLEEDING - FLEXIBLE N/A 12/16/2018    Performed by Buckles, Vinnie Level, MD at [facility] ENDO   ? ESOPHAGOGASTRODUODENOSCOPY WITH SPECIMEN COLLECTION BY BRUSHING/ WASHING N/A 01/22/2019 Performed by Veneta Penton, MD at [facility] ENDO   ? ESOPHAGOGASTRODUODENOSCOPY WITH DILATION ESOPHAGUS WITH BALLOON 30 MM OR GREATER - FLEXIBLE N/A 01/22/2019    Performed by Veneta Penton, MD at [facility] ENDO   ? ESOPHAGOGASTRODUODENOSCOPY WITH BIOPSY - FLEXIBLE N/A 01/22/2019    Performed by Veneta Penton, MD at [facility] ENDO   ? ESOPHAGOGASTRODUODENOSCOPY WITH BIOPSY - FLEXIBLE N/A 06/19/2019    Performed by Dawna Part, MD at [facility] ENDO   ? ESOPHAGOGASTRODUODENOSCOPY [WITH APC]  WITH SPECIMEN COLLECTION BY BRUSHING/ WASHING N/A 08/07/2019    Performed by Dawna Part, MD at [facility] ENDO   ? CHOLECYSTECTOMY     ? COLONOSCOPY     ? ESOPHAGOGASTRIC FUNDOPLICATION  2003, 2004    laparoscopic   ? ESOPHAGOGASTRIC FUNDOPLICATION     ? PR ESOPHAGOSCOPY FLEXIBLE TRANSORAL DIAGNOSTIC       Family History   Problem Relation Age of Onset   ? Other Mother    ? Kidney Failure Mother    ? Osteoporosis Mother    ? Cancer Father         esophageal   ? Liver Disease Sister         endstage, s/p liver transplant   ? Cancer Brother         tonsil, liver    ? Hip Fracture Neg Hx      Social History     Socioeconomic History   ? Marital status: Married     Spouse name: Not on file   ? Number of children: 1   ? Years of education: Not on file   ? Highest education level: Not on file   Occupational History   ? Occupation: Geographical information systems officer: EMPLOYED   Tobacco Use   ? Smoking status: Never Smoker   ? Smokeless tobacco: Never Used   Substance and Sexual Activity   ? Alcohol use: No   ? Drug use: No   ? Sexual activity: Yes     Partners: Male     Birth control/protection: Other   Other Topics Concern   ? Not on file   Social History Narrative    Negative  For tobacco, alcohol, or illicit drug use.  No high risk sexual behavior or tattoo placement and no transfusion prior to 1992.  Married, one child healthy.  Works in Radio broadcast assistant.    Works in [employer] (includes history and patient reported):   Immunization History Administered Date(s) Administered   ? Flu Vaccine =>6 Months Quadrivalent PF 08/27/2017   ? Hepatitis A vaccine Adult IM 08/13/2013           Allergies:  Adhesive tape (rosins); Contrast dye iv, iodine containing [iodinated contrast media]; Sulfa (sulfonamide antibiotics); Codeine; Morphine; Penicillin g; and Tramadol    Medications:  (Not in a hospital admission)    Review of Systems:  A comprehensive  12 point review of organ systems reviewed and was negative except for the ones mentioned in HOPI    Physical Exam:  Vital Signs: Last Filed In 24 Hours Vital Signs: 24 Hour Range   BP: 114/55 (09/23 1800)  Temp: 37 ?C (98.6 ?F) (09/23 1453)  Respirations: 16 PER MINUTE (09/23 1453)  SpO2: 100 % (09/23 1900)  SpO2 Pulse: 106 (09/23 1900) BP: (109-123)/(45-68)   Temp:  [37 ?C (98.6 ?F)]   Respirations:  [16 PER MINUTE]   SpO2:  [100 %]           General:  Alert, awake, oriented x 3 , cooperative, no distress, appears stated age  Head:  Normocephalic, without obvious abnormality, atraumatic  Eyes:  Conjunctivae/corneas clear   Nose: Nares normal. Mucosa normal.  No drainage or sinus tenderness  Throat: Lips, mucosa and tongue normal  Neck:    Supple, symmetrical, trachea midline, no adenopathy, thyroid: no enlargement/tenderness/nodules  Lungs:  Clear to auscultation bilaterally  Heart:   Regular rate and rhythm, S1, S2 normal, no murmur  Abdomen:  Soft, non-tender.  Bowel sounds normal.  No masses.  No organomegaly.  Extremities: Extremities normal, atraumatic, no cyanosis or edema  Skin: Skin color, texture, turgor normal.    Lymph nodes:  Cervical, supraclavicular and axillary nodes normal  Neurologic: Non focal grossly    Lab/Radiology/Other Diagnostic Tests:  24-hour labs:    Results for orders placed or performed during the hospital encounter of 08/12/19 (from the past 24 hour(s))   CBC AND DIFF    Collection Time: 08/12/19  5:01 PM   Result Value Ref Range    White Blood Cells 7.5 4.5 - 11.0 K/UL RBC 2.92 (L) 4.0 - 5.0 M/UL    Hemoglobin 8.1 (L) 12.0 - 15.0 GM/DL    Hematocrit 04.5 (L) 36 - 45 %    MCV 87.6 80 - 100 FL    MCH 27.9 26 - 34 PG    MCHC 31.9 (L) 32.0 - 36.0 G/DL    RDW 40.9 (H) 11 - 15 %    Platelet Count 287 150 - 400 K/UL    MPV 8.4 7 - 11 FL    Neutrophils 67 41 - 77 %    Lymphocytes 18 (L) 24 - 44 %    Monocytes 11 4 - 12 %    Eosinophils 2 0 - 5 %    Basophils 2 0 - 2 %    Absolute Neutrophil Count 5.08 1.8 - 7.0 K/UL    Absolute Lymph Count 1.31 1.0 - 4.8 K/UL    Absolute Monocyte Count 0.78 0 - 0.80 K/UL    Absolute Eosinophil Count 0.14 0 - 0.45 K/UL    Absolute Basophil Count 0.16 0 - 0.20 K/UL    MDW (Monocyte Distribution Width) 18.9 <20.7   COMPREHENSIVE METABOLIC PANEL    Collection Time: 08/12/19  5:01 PM  Result Value Ref Range    Sodium 137 137 - 147 MMOL/L    Potassium 3.8 3.5 - 5.1 MMOL/L    Chloride 102 98 - 110 MMOL/L    Glucose 103 (H) 70 - 100 MG/DL    Blood Urea Nitrogen 34 (H) 7 - 25 MG/DL    Creatinine 5.57 (H) 0.4 - 1.00 MG/DL    Calcium 8.8 8.5 - 32.2 MG/DL    Total Protein 6.8 6.0 - 8.0 G/DL    Total Bilirubin 1.1 0.3 - 1.2 MG/DL    Albumin 3.3 (L) 3.5 - 5.0 G/DL    Alk Phosphatase 025 (H) 25 - 110 U/L    AST (SGOT) 47 (H) 7 - 40 U/L    CO2 22 21 - 30 MMOL/L    ALT (SGPT) 28 7 - 56 U/L    Anion Gap 13 (H) 3 - 12    eGFR Non African American 36 (L) >60 mL/min    eGFR African American 44 (L) >60 mL/min   AMMONIA    Collection Time: 08/12/19  5:01 PM   Result Value Ref Range    Ammonia 61 (H) 9 - 35 MCMOL/L   PROTIME INR (PT)    Collection Time: 08/12/19  5:01 PM   Result Value Ref Range    INR 1.2 0.8 - 1.2   PTT (APTT)    Collection Time: 08/12/19  5:01 PM   Result Value Ref Range    APTT 29.4 24.0 - 36.5 SEC   TYPE & CROSSMATCH    Collection Time: 08/12/19  5:01 PM   Result Value Ref Range    Units Ordered 0     Crossmatch Expires 08/15/2019,2359     Record Check FOUND     ABO/RH(D) O NEG     Antibody Screen NEG     Electronic Crossmatch YES    TROPONIN-I Collection Time: 08/12/19  5:01 PM   Result Value Ref Range    Troponin-I 0.01 0.0 - 0.05 NG/ML   TSH WITH FREE T4 REFLEX    Collection Time: 08/12/19  5:01 PM   Result Value Ref Range    TSH 2.19 0.35 - 5.00 MCU/ML   POC LACTATE    Collection Time: 08/12/19  5:15 PM   Result Value Ref Range    LACTIC ACID POC 2.1 (H) 0.5 - 2.0 MMOL/L     Glucose: (!) 103 (08/12/19 1701)  Pertinent radiology reviewed.    Ct Head Wo Contrast    Result Date: 08/12/2019  No acute intracranial hemorrhage or mass effect.  By my electronic signature, I attest that I have personally reviewed the images for this examination and formulated the interpretations and opinions expressed in this report  Finalized by Laqueta Due, M.D. on 08/12/2019 8:24 PM. Dictated by Merla Riches, D.O. on 08/12/2019 6:34 PM.     Chest Single View    Result Date: 08/12/2019  No acute cardiopulmonary abnormality.  Finalized by Laqueta Due, M.D. on 08/12/2019 6:09 PM. Dictated by Laqueta Due, M.D. on 08/12/2019 6:07 PM.

## 2019-08-13 NOTE — ED Notes
Pt states that when taking lactulose she sometimes can't make it to the bathroom. Removed Pt's pants, placed a new brief, placed 2 chuck pads in bed and a bedside commode at bedside on a chuck pad w/ garbage can and wipes on side.

## 2019-08-14 ENCOUNTER — Encounter: Admit: 2019-08-14 | Discharge: 2019-08-14 | Payer: MEDICARE

## 2019-08-14 LAB — COMPREHENSIVE METABOLIC PANEL
Lab: 125 mg/dL — ABNORMAL HIGH (ref >60)
Lab: 134 MMOL/L — ABNORMAL LOW (ref 137–147)

## 2019-08-14 LAB — MAGNESIUM: Lab: 2.4 mg/dL — ABNORMAL HIGH (ref 1.6–2.6)

## 2019-08-14 LAB — CBC
Lab: 26 % — ABNORMAL LOW (ref 36–45)
Lab: 6.8 K/UL — ABNORMAL HIGH (ref >60)

## 2019-08-14 LAB — PROTIME INR (PT): Lab: 1.1 MMOL/L — ABNORMAL LOW (ref 0.8–1.2)

## 2019-08-14 MED ORDER — POLYETHYLENE GLYCOL 3350 17 GRAM PO PWPK
1 | Freq: Two times a day (BID) | ORAL | 0 refills | Status: DC
Start: 2019-08-14 — End: 2019-08-14

## 2019-08-14 MED ORDER — SPIRONOLACTONE 50 MG PO TAB
50 mg | ORAL_TABLET | Freq: Every day | ORAL | 3 refills | 46.00000 days | Status: DC
Start: 2019-08-14 — End: 2019-09-11

## 2019-08-14 MED ORDER — LACTULOSE 10 GRAM/15 ML PO SOLN WRAPPER
20 g | ORAL | 1 refills | 21.00000 days | Status: DC | PRN
Start: 2019-08-14 — End: 2019-12-09
  Filled 2019-08-14: qty 236, 8d supply, fill #1

## 2019-08-14 MED ORDER — ALBUMIN, HUMAN 25 % IV SOLP
25 g | Freq: Once | INTRAVENOUS | 0 refills | Status: CP
Start: 2019-08-14 — End: ?
  Administered 2019-08-14: 19:00:00 25 g via INTRAVENOUS

## 2019-08-14 MED ORDER — RIFAXIMIN 550 MG PO TAB
550 mg | ORAL_TABLET | Freq: Two times a day (BID) | ORAL | 1 refills | 30.00000 days | Status: DC
Start: 2019-08-14 — End: 2019-10-30

## 2019-08-14 MED ORDER — FUROSEMIDE 40 MG PO TAB
40 mg | ORAL_TABLET | Freq: Every day | ORAL | 1 refills | 90.00000 days | Status: DC
Start: 2019-08-14 — End: 2019-09-11

## 2019-08-15 ENCOUNTER — Encounter: Admit: 2019-08-15 | Discharge: 2019-08-15 | Payer: MEDICARE

## 2019-08-17 NOTE — Telephone Encounter
Patient called in stating she was instructed to call you today.  Please return her call on her cell [phone]

## 2019-08-18 LAB — CULTURE-BLOOD W/SENSITIVITY

## 2019-08-19 ENCOUNTER — Encounter: Admit: 2019-08-19 | Discharge: 2019-08-19 | Payer: MEDICARE

## 2019-08-19 DIAGNOSIS — K7460 Unspecified cirrhosis of liver: Secondary | ICD-10-CM

## 2019-08-19 DIAGNOSIS — D61818 Other pancytopenia: Secondary | ICD-10-CM

## 2019-08-19 DIAGNOSIS — R799 Abnormal finding of blood chemistry, unspecified: Secondary | ICD-10-CM

## 2019-08-19 DIAGNOSIS — D500 Iron deficiency anemia secondary to blood loss (chronic): Secondary | ICD-10-CM

## 2019-08-19 LAB — CBC AND DIFF
Lab: 0.1
Lab: 0.3
Lab: 0.6
Lab: 0.8 — ABNORMAL LOW (ref 0.9–5.1)
Lab: 11 — ABNORMAL HIGH (ref 0.0–10.0)
Lab: 162
Lab: 17 — ABNORMAL LOW (ref 18.0–47.0)
Lab: 2.2 — ABNORMAL HIGH (ref 0.0–2.0)
Lab: 3
Lab: 6.3 — ABNORMAL HIGH (ref 0.0–6.0)
Lab: 62
Lab: 93
Lab: 20 % — ABNORMAL HIGH (ref 11.5–14.5)
Lab: 28 % — ABNORMAL LOW (ref 37.0–47.0)
Lab: 28 pg (ref 27.0–31.0)
Lab: 3 uL — ABNORMAL LOW (ref 4.20–5.40)
Lab: 30 % (ref 30.0–34.0)
Lab: 4.8 10*3/uL (ref 4.8–10.8)
Lab: 8.8 g/dL — ABNORMAL LOW (ref 12.0–16.0)

## 2019-08-19 LAB — PROTIME INR (PT)
Lab: 1.1 % — ABNORMAL LOW (ref 41–77)
Lab: 12 s (ref 9.9–12.6)

## 2019-08-20 ENCOUNTER — Encounter: Admit: 2019-08-20 | Discharge: 2019-08-20 | Payer: MEDICARE

## 2019-08-20 DIAGNOSIS — K7460 Unspecified cirrhosis of liver: Secondary | ICD-10-CM

## 2019-08-20 LAB — COMPREHENSIVE METABOLIC PANEL
Lab: 0.9
Lab: 1.2
Lab: 101
Lab: 109
Lab: 3 — ABNORMAL LOW (ref 3.3–5.5)
Lab: 33
Lab: 48 — ABNORMAL LOW (ref >59)
Lab: 5.7 — ABNORMAL LOW (ref 6.4–8.1)
Lab: 8.9 (ref 8.0–10.3)
Lab: 137 mmol/L (ref 128–145)
Lab: 17 meq/L — ABNORMAL HIGH (ref 0–14)
Lab: 174 U/L — ABNORMAL HIGH (ref 42–141)
Lab: 19 mg/dL (ref 7–22)
Lab: 4.5 mmol/L (ref 3.6–5.1)

## 2019-08-21 ENCOUNTER — Encounter: Admit: 2019-08-21 | Discharge: 2019-08-21 | Payer: MEDICARE

## 2019-08-21 ENCOUNTER — Ambulatory Visit: Admit: 2019-08-21 | Discharge: 2019-08-21 | Payer: MEDICARE

## 2019-08-21 DIAGNOSIS — K7460 Unspecified cirrhosis of liver: Secondary | ICD-10-CM

## 2019-08-21 DIAGNOSIS — K760 Fatty (change of) liver, not elsewhere classified: Secondary | ICD-10-CM

## 2019-08-21 DIAGNOSIS — I10 Essential (primary) hypertension: Secondary | ICD-10-CM

## 2019-08-21 DIAGNOSIS — R0600 Dyspnea, unspecified: Secondary | ICD-10-CM

## 2019-08-21 DIAGNOSIS — J329 Chronic sinusitis, unspecified: Secondary | ICD-10-CM

## 2019-08-21 DIAGNOSIS — E119 Type 2 diabetes mellitus without complications: Secondary | ICD-10-CM

## 2019-08-21 DIAGNOSIS — E669 Obesity, unspecified: Secondary | ICD-10-CM

## 2019-08-21 DIAGNOSIS — I729 Aneurysm of unspecified site: Secondary | ICD-10-CM

## 2019-08-21 DIAGNOSIS — K5730 Diverticulosis of large intestine without perforation or abscess without bleeding: Secondary | ICD-10-CM

## 2019-08-21 DIAGNOSIS — Z01810 Encounter for preprocedural cardiovascular examination: Secondary | ICD-10-CM

## 2019-08-21 MED ORDER — FERROUS SULFATE 325 MG (65 MG IRON) PO TAB
325 mg | ORAL_TABLET | Freq: Two times a day (BID) | ORAL | 1 refills | Status: DC
Start: 2019-08-21 — End: 2019-09-29

## 2019-08-24 LAB — COMPREHENSIVE METABOLIC PANEL
Lab: 0.8 mg/dL
Lab: 1.1 mg/dL — ABNORMAL HIGH (ref 0.57–1.11)
Lab: 139 mmol/l — ABNORMAL LOW (ref 4.20–5.40)
Lab: 151 — ABNORMAL HIGH (ref 70–105)
Lab: 16 — ABNORMAL HIGH (ref 0–14)
Lab: 163 U/L — ABNORMAL HIGH (ref 40–150)
Lab: 2.6 g/dL — ABNORMAL LOW (ref 3.4–4.8)
Lab: 22 mmol/L — ABNORMAL LOW (ref 23–31)
Lab: 22 — ABNORMAL HIGH (ref 0.0–2.0)
Lab: 27 mg/dL — ABNORMAL HIGH (ref 9.8–20.1)
Lab: 41 U/L — ABNORMAL HIGH (ref 5–34)
Lab: 5.8 — ABNORMAL LOW (ref 6.2–8.1)
Lab: 50 — ABNORMAL LOW (ref >59)
Lab: 8.4 mg/dL

## 2019-08-24 LAB — CBC AND DIFF
Lab: 0.1
Lab: 0.2
Lab: 4.7 — ABNORMAL LOW (ref 4.8–10.8)

## 2019-08-24 LAB — PROTIME INR (PT)
Lab: 1.2 — ABNORMAL LOW (ref 37.0–47.0)
Lab: 13 s — ABNORMAL HIGH (ref 9.9–12.6)

## 2019-08-27 MED ORDER — DIPHENHYDRAMINE HCL 25 MG PO CAP
25 mg | Freq: Once | ORAL | 0 refills | Status: CN
Start: 2019-08-27 — End: ?

## 2019-08-27 MED ORDER — IRON DEXTRAN IVPB
25 mg | Freq: Once | INTRAVENOUS | 0 refills | Status: CN
Start: 2019-08-27 — End: ?

## 2019-08-27 MED ORDER — ACETAMINOPHEN 500 MG PO TAB
500 mg | Freq: Once | ORAL | 0 refills | Status: CN
Start: 2019-08-27 — End: ?

## 2019-08-27 MED ORDER — IRON DEXTRAN IVPB
975 mg | Freq: Once | INTRAVENOUS | 0 refills | Status: CN
Start: 2019-08-27 — End: ?

## 2019-08-27 NOTE — Telephone Encounter
call returned, patient instructed to increase lasix to 60mg  daily, see result note from Garner Nash, APRN.      iron infusion ordered. Plan for patient to repeat BMP 1 week, standing orders at [facility]

## 2019-09-01 ENCOUNTER — Encounter: Admit: 2019-09-01 | Discharge: 2019-09-01 | Payer: MEDICARE

## 2019-09-01 DIAGNOSIS — D509 Iron deficiency anemia, unspecified: Secondary | ICD-10-CM

## 2019-09-01 DIAGNOSIS — K7460 Unspecified cirrhosis of liver: Secondary | ICD-10-CM

## 2019-09-01 LAB — COMPREHENSIVE METABOLIC PANEL
Lab: 1.1 mg/dL — ABNORMAL LOW (ref 0.2–1.2)
Lab: 1.2 % — ABNORMAL HIGH (ref 0.57–1.11)
Lab: 135 mmol/L — ABNORMAL LOW (ref 136–145)
Lab: 14 meq/L (ref 0–14)
Lab: 145 U/L (ref 40–150)
Lab: 18 (ref 0–55)
Lab: 2.3 g/dL — ABNORMAL LOW (ref 3.4–4.8)
Lab: 22 mmol/L — ABNORMAL LOW (ref 23–31)
Lab: 29 mg/dL — ABNORMAL HIGH (ref 9.8–20.1)
Lab: 42 U/L — ABNORMAL HIGH (ref 5–34)
Lab: 46 mL/min/{1.73_m2} — ABNORMAL LOW (ref >59)
Lab: 5.6 g/dL — ABNORMAL LOW (ref 6.2–8.1)
Lab: 8.3 mg/dL — ABNORMAL LOW (ref 8.4–10.2)

## 2019-09-01 LAB — PROTIME INR (PT)
Lab: 1.2 % — ABNORMAL LOW (ref 98–107)
Lab: 13 s — ABNORMAL HIGH (ref 9.9–12.6)

## 2019-09-01 LAB — CBC AND DIFF
Lab: 0.1
Lab: 0.2
Lab: 0.4 10*3/uL (ref 0.1–0.9)
Lab: 5.3 uL (ref 4.8–10.8)

## 2019-09-02 ENCOUNTER — Encounter: Admit: 2019-09-02 | Discharge: 2019-09-02 | Payer: MEDICARE

## 2019-09-02 ENCOUNTER — Ambulatory Visit: Admit: 2019-09-02 | Discharge: 2019-09-03 | Payer: MEDICARE

## 2019-09-02 DIAGNOSIS — D500 Iron deficiency anemia secondary to blood loss (chronic): Principal | ICD-10-CM

## 2019-09-02 LAB — CBC
Lab: 2.4 M/UL — ABNORMAL LOW (ref 4.0–5.0)
Lab: 29 pg (ref 26–34)
Lab: 32 g/dL (ref 32.0–36.0)
Lab: 4.2 K/UL — ABNORMAL LOW (ref 4.5–11.0)
Lab: 7 g/dL — ABNORMAL LOW (ref 12.0–15.0)

## 2019-09-02 NOTE — Patient Instructions
HEART FAILURE INFUSION CLINIC  OUTPATIENT POST TRANSFUSION INSTRUCTIONS     During your transfusion you were monitored by nursing staff for signs of a transfusion reaction, however, you should continue to observe for the following symptoms after you have been dismissed from the clinic:     FEVER OF 100.5 OR HIGHER  CHILLS  NAUSEA OR VOMITING  FEELING FAINT OR DIZZY  SHORTNESS OF BREATH  DARK OR RED COLORED URINE  CHEST OR BACK PAIN  SHOCK OR LOSS OF CONSCIOUSNESS  YELLOWING OF THE EYES OR SKIN     If you or your caregiver notice any of these symptoms, contact your ordering physician immediately and go to the nearest Emergency Department for treatment. When you arrive at the Emergency Department notify them that you recently received a blood transfusion. *For more detailed signs and symptoms of a transfusion reaction, please refer to the [place] education information below.          When You Need a Blood Transfusion (Adult)  A blood transfusion may be done when you have lost blood because of an injury or during surgery. It can also be done because of diseases or conditions that affect the blood. Blood is made up of several different parts (blood products). You may receive some or all of these blood products during a transfusion. Blood for transfusion is usually donated from another person (donor). Strict measures are taken to make sure that donated blood is safe before it's given to you. This sheet helps you understand how a blood transfusion is done. Your healthcare provider will discuss your condition with you and answer your questions.   The parts of blood  Blood can be broken down into different parts that perform special roles in the body. These parts include:  ? Red blood cells, which carry oxygen throughout the body.  ? Platelets, which help stop bleeding.  ? Plasma (the liquid part of blood), which carries red blood cells and platelets throughout the body. Plasma also helps platelets in stopping bleeding. Where does donated blood come from?  ? Volunteer donors. These are people who donate their blood to help others in need of blood. Blood donation can take place at several places, including a hospital, blood bank, or during a blood drive.  ? Directed donation. If you need a blood transfusion during a planned surgery, family and friends can have their blood tested for compatibility and donate blood for you before the surgery. This needs to be done at least 7 day(s) in advance. This is because the blood must be tested for safety.  ? Autologous donation. This is also called self-donation. For planned surgery, you can donate your own blood starting up to 6 weeks before surgery.  Are blood transfusions safe?  Donated blood is tested and processed to make sure that the blood is safe:  ? The health and medical history of each donor is carefully screened. If a person is considered high-risk for infection or problems, he or she isn't accepted as a blood donor.  ? Donated blood is tested for infections such as hepatitis, syphilis, and HIV (the virus that causes AIDS). If the tested blood is found to be unsafe, it's destroyed.  ? Blood is divided into four types: A, B, AB, and O. Blood also has Rh types: positive (+) and negative (-). You can only receive blood products that are compatible with (match) your blood type. A sample of your blood is tested for compatibility with donated blood. This is  done before blood products are prepared for a transfusion.  How is a blood transfusion done?  A blood transfusion takes place in a blood center, infusion center, hospital room, or operating room. Your healthcare provider will discuss the blood transfusion with you before it's done. You'll need to give permission for the blood transfusion by signing a consent form.  ? Two healthcare providers confirm your identity. They also confirm that they have the correct blood product(s) for you. ? An intravenous (IV) line is placed in a vein if you do not already have an IV.  ? The blood product comes in a plastic bag that is hung on an IV pole. The blood product flows from the bag into your IV line. The IV line may be connected to a pump, which controls the transfusion rate. You may receive more than one kind of blood product through the IV.  ? Your vital signs (blood pressure, heart rate, respiratory rate, and temperature) are checked throughout the transfusion. This is to make sure you are not having a reaction to the blood product.  ? The IV line may be removed once the transfusion is complete.  Possible risks and complications of blood transfusions  Most transfusions are problem free. In some cases, reactions occur. These can happen within seconds to minutes during the transfusion or a week to a few months after the transfusion. Call your doctor or nurse right away if you have any of the signs or symptoms in the table below during or after a transfusion:  Reaction Timing Symptoms   Allergic reaction (mild) ? Within seconds to minutes during the transfusion  ? Up to 24 hours after the transfusion Hives or red welts on the skin, mild itching, rash, localized swelling, flushing (red face), wheezing, shortness of breath, or stridor (high-pitched noise or sound)   Anaphylactic reaction ? Within seconds to minutes during the transfusion  ? Up to 24 hours after the transfusion Shortness of breath, flushing (red face), wheezing, labored (working hard) breathing, low blood pressure, localized swelling, chest tightness, or cramps   Febrile nonhemolytic reaction ? Within minutes to hours during the transfusion  ? Within a few hours to 24 hours after the transfusion Fever (increase of 1? C or higher), chills, flushing (red face), nausea, headache, minor discomfort, or mild shortness of breath   Acute immune hemolytic reaction ? Within minutes during the transfusion ? Up to 24 hours after the transfusion Fever, red or brown urine, back pain, fast heart rate (tachycardia), abdominal pain, low blood pressure, feeling anxious, chills, chest pain, nausea, or fainting spells   Transfusion-related acute lung injury (TRALI) ? Within 1 to 2 hours during the transfusion  ? Up to 6 hours after the transfusion Shortness of breath, trouble breathing, low blood pressure, fever, pulmonary edema   Transfusion-associated circulatory overload ? Near the end of the transfusion  ? Within 6 hours after the transfusion Shortness of breath, fast heart rate (tachycardia), problems breathing when lying on back, abnormal blood pressure   Post-transfusion purpura (PUP) ? Within 1 week  ? Up to 48 days after the transfusion Purple spots on skin; nose bleed; bleeding from the urinary tract, abdomen, colon, or rectum; fever; or chills     Delayed transfusion-related acute lung injury (TRALI) ? Within 72 hours (3 days) after the transfusion Sudden onset of respiratory distress or trouble breathing   Delayed hemolytic reaction ? Within 3 to 7 days  ? Up to weeks after the transfusion Low-grade  fever, mild jaundice (yellowing of the skin and whites of the eyes), decrease in hematocrit, chills, chest pain, back pain, nausea   ? 2000-2019 [employer], [place]. [address], [place], [place] [address]. All rights reserved. This information is not intended as a substitute for professional medical care. Always follow your healthcare professional's instructions.

## 2019-09-03 DIAGNOSIS — D500 Iron deficiency anemia secondary to blood loss (chronic): Principal | ICD-10-CM

## 2019-09-08 ENCOUNTER — Encounter: Admit: 2019-09-08 | Discharge: 2019-09-08 | Payer: MEDICARE

## 2019-09-08 ENCOUNTER — Encounter: Admit: 2019-09-08 | Discharge: 2019-09-09 | Payer: MEDICARE

## 2019-09-08 DIAGNOSIS — Z1159 Encounter for screening for other viral diseases: Secondary | ICD-10-CM

## 2019-09-08 DIAGNOSIS — K7460 Unspecified cirrhosis of liver: Secondary | ICD-10-CM

## 2019-09-08 LAB — PROTIME INR (PT)
Lab: 1.3
Lab: 14 s — ABNORMAL HIGH (ref 9.9–12.6)

## 2019-09-08 LAB — COMPREHENSIVE METABOLIC PANEL
Lab: 1.1
Lab: 1.5 — ABNORMAL HIGH (ref 0.57–1.11)
Lab: 102
Lab: 104
Lab: 12 (ref 0–14)
Lab: 156 — ABNORMAL HIGH (ref 40–150)
Lab: 2.3 — ABNORMAL LOW (ref 3.4–4.8)
Lab: 22 — ABNORMAL HIGH (ref 9.8–20.1)
Lab: 25 (ref 23–31)
Lab: 37 — AB (ref >59)
Lab: 39 — ABNORMAL HIGH (ref 5–34)
Lab: 5.9 — ABNORMAL LOW (ref 6.2–8.1)
Lab: 8.1 — ABNORMAL LOW (ref 8.4–10.2)
Lab: 135 mmol/L — ABNORMAL LOW (ref 136–145)
Lab: 4.2 mmol/L (ref 3.5–5.1)

## 2019-09-10 ENCOUNTER — Ambulatory Visit: Admit: 2019-09-10 | Discharge: 2019-09-10 | Payer: MEDICARE

## 2019-09-10 ENCOUNTER — Encounter: Admit: 2019-09-10 | Discharge: 2019-09-10 | Payer: MEDICARE

## 2019-09-10 DIAGNOSIS — K5730 Diverticulosis of large intestine without perforation or abscess without bleeding: Secondary | ICD-10-CM

## 2019-09-10 DIAGNOSIS — E669 Obesity, unspecified: Secondary | ICD-10-CM

## 2019-09-10 DIAGNOSIS — Z01810 Encounter for preprocedural cardiovascular examination: Secondary | ICD-10-CM

## 2019-09-10 DIAGNOSIS — R0600 Dyspnea, unspecified: Secondary | ICD-10-CM

## 2019-09-10 DIAGNOSIS — I729 Aneurysm of unspecified site: Secondary | ICD-10-CM

## 2019-09-10 DIAGNOSIS — J329 Chronic sinusitis, unspecified: Secondary | ICD-10-CM

## 2019-09-10 DIAGNOSIS — K7460 Unspecified cirrhosis of liver: Secondary | ICD-10-CM

## 2019-09-10 DIAGNOSIS — K760 Fatty (change of) liver, not elsewhere classified: Secondary | ICD-10-CM

## 2019-09-10 DIAGNOSIS — I10 Essential (primary) hypertension: Secondary | ICD-10-CM

## 2019-09-10 DIAGNOSIS — E119 Type 2 diabetes mellitus without complications: Secondary | ICD-10-CM

## 2019-09-10 LAB — POC GLUCOSE: Lab: 116 mg/dL — ABNORMAL HIGH (ref 70–100)

## 2019-09-10 MED ORDER — PROPOFOL INJ 10 MG/ML IV VIAL
INTRAVENOUS | 0 refills | Status: DC
Start: 2019-09-10 — End: 2019-09-10
  Administered 2019-09-10: 13:00:00 40 mg via INTRAVENOUS
  Administered 2019-09-10 (×2): 20 mg via INTRAVENOUS

## 2019-09-10 MED ORDER — PROPOFOL 10 MG/ML IV EMUL
INTRAVENOUS | 0 refills | Status: DC
Start: 2019-09-10 — End: 2019-09-10
  Administered 2019-09-10: 13:00:00 100 ug/kg/min via INTRAVENOUS

## 2019-09-10 MED ORDER — LACTATED RINGERS IV SOLP
1000 mL | Freq: Once | INTRAVENOUS | 0 refills | Status: CP
Start: 2019-09-10 — End: ?
  Administered 2019-09-10: 13:00:00 1000.000 mL via INTRAVENOUS

## 2019-09-10 NOTE — Anesthesia Post-Procedure Evaluation
Post-Anesthesia Evaluation    Name: Kristin Moody      MRN: [id number]     DOB: 1956-05-24     Age: 63 y.o.     Sex: female   __________________________________________________________________________     Procedure Information     Anesthesia Start Date/Time:  09/10/19 0814    Procedures:       ESOPHAGOGASTRODUODENOSCOPY WITH SPECIMEN COLLECTION BY BRUSHING/ WASHING (N/A )      ESOPHAGOGASTRODUODENOSCOPY WITH CONTROL OF BLEEDING - FLEXIBLE (N/A )    Location:  ENDO 3 / ENDO/GI    Surgeon:  Buckles, Darnelle Maffucci, MD          Post-Anesthesia Vitals  BP: 109/60 (10/22 0845)  Temp: 36.5 C (97.7 F) (10/22 0830)  Pulse: 99 (10/22 0845)  Respirations: 21 PER MINUTE (10/22 0845)  SpO2: 97 % (10/22 0845)  SpO2 Pulse: 100 (10/22 0845)   Vitals Value Taken Time   BP 109/60 09/10/2019  8:45 AM   Temp 36.5 C (97.7 F) 09/10/2019  8:30 AM   Pulse 99 09/10/2019  8:45 AM   Respirations 21 PER MINUTE 09/10/2019  8:45 AM   SpO2 97 % 09/10/2019  8:45 AM         Post Anesthesia Evaluation Note    Evaluation location: other  Patient participation: recovered; patient participated in evaluation  Level of consciousness: alert    Pain score: 0  Pain management: adequate    Hydration: normovolemia  Temperature: 36.0C - 38.4C  Airway patency: adequate    Perioperative Events       Post-op nausea and vomiting: no PONV    Postoperative Status  Cardiovascular status: hemodynamically stable  Respiratory status: spontaneous ventilation  Follow-up needed: none        Perioperative Events  Perioperative Event: No  Emergency Case Activation: No     May d/c from Anesthesia care.  Colonel Bald, MD

## 2019-09-10 NOTE — Anesthesia Pre-Procedure Evaluation
Anesthesia Pre-Procedure Evaluation    Name: Kristin Moody      MRN: [id number]     DOB: [birth year]     Age: 63 y.o.     Sex: female   _________________________________________________________________________     Procedure Date: 09/10/2019   Procedure: Procedure(s):  ESOPHAGOGASTRODUODENOSCOPY WITH SPECIMEN COLLECTION BY BRUSHING/ WASHING     Physical Assessment  Vital Signs (last filed in past 24 hours):         Patient History  Allergies   Allergen Reactions   ? Adhesive Tape (Rosins) RASH   ? Contrast Dye Iv, Iodine Containing [Iodinated Contrast Media] RASH   ? Sulfa (Sulfonamide Antibiotics) RASH   ? Codeine NAUSEA AND VOMITING   ? Morphine SEE COMMENTS     Pt reports burns her veins   ? Penicillin G SEE COMMENTS     Throat issues, blisters in throat    ? Tramadol NAUSEA ONLY and ITCHING     Takes this medication regularly         Current Medications    Medication Directions   atorvastatin (LIPITOR) 10 mg tablet TAKE 1 TABLET BY MOUTH EVERY DAY   duloxetine DR (CYMBALTA) 60 mg capsule Take 60 mg by mouth daily.   ferrous sulfate (FEOSOL) 325 mg (65 mg iron) tablet Take one tablet by mouth twice daily. Take on an empty stomach at least 1 hour before or 2 hours after food.   furosemide (LASIX) 40 mg tablet Take one tablet by mouth daily.   lactulose 10 gram/15 mL oral solution Take 30 mL by mouth as Needed.   pantoprazole DR (PROTONIX) 40 mg tablet TAKE 1 TABLET BY MOUTH TWICE A DAY   polyethylene glycol 3350 (MIRALAX) 17 g packet Take one packet by mouth twice daily.  Patient taking differently: Take 17 g by mouth daily.   potassium chloride SR (K-DUR) 20 mEq tablet Take 20 mEq by mouth daily. Take with a meal and a full glass of water.   promethazine (PHENERGAN) 25 mg tablet Take 25 mg by mouth every 4 hours as needed. Just uses it with Tramadol   rifAXIMin (XIFAXAN) 550 mg tablet Take one tablet by mouth twice daily.   spironolactone (ALDACTONE) 50 mg tablet Take one tablet by mouth daily. Take with food.  Indications: ascites, accumulation of fluid caused by cirrhosis of the liver   traMADoL (ULTRAM) 50 mg tablet Take 50 mg by mouth every 6 hours as needed for Pain.   vitamin A 10,000 unit capsule TAKE 1 CAPSULE BY MOUTH EVERY DAY   zinc sulfate 220 mg (50 mg elemental zinc) capsule Take one capsule by mouth daily.         Review of Systems/Medical History      Patient summary reviewed  Nursing notes reviewed  Pertinent labs reviewed    PONV Screening: Female gender and Non-smoker  No history of anesthetic complications  No family history of anesthetic complications      Airway - negative        Pulmonary - negative          Cardiovascular       Recent diagnostic studies:          echocardiogram          08/28/2018  Interpretation Summary     ? Left Ventricle: Normal size, wall thickness and shape. Normal ejection fraction with LVEF=63% by Simpson's biplane. There are no left ventricular segmental wall motion abnormalities.  ? Right Ventricle: Normal  size, wall thickness, ejection fraction and septal motion.  ? Left Atrium: Normal size. Right Atrium: Normal size.  ? There is no significant valve disease.  ? Estimated Peak Systolic PA Pressure 48 mmHg  ? Compared with study dated 08/2012, the PA pressure estimate is higher (~35-->14mmHg). Otherwise, no significant change is noted.     Echocardiographic Findings     Left Ventricle Normal size, wall thickness and shape. Normal ejection fraction. Cannot determine left ventricular diastolic dysfunction grade.  Right Ventricle Normal size, wall thickness, ejection fraction and septal motion.  Left Atrium Normal size.  Right Atrium Normal size.  IVC/SVC Markedly elevated central venous pressure (>15 mm Hg).  Mitral Valve Normal valve structure. No stenosis. Mild regurgitation.  Tricuspid Valve Mild to moderate regurgitation.  Aortic Valve Normal valve structure. No stenosis. No regurgitation.  Pulmonary No regurgitation. Aorta The aortic root and ascending aorta appear normal in size.  Pericardium Pericardial fat pad present. No pericardial effusion. Ascites present.          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 These been biopsied and not consistent with GAVE.         Neuro/Psych         Chronic opioid use        Psychiatric history          Anxiety        Endocrine/Other       Diabetes, well controlled, type 2      Anemia      Blood dyscrasia      Obesity    Constitution - negative   Physical Exam    Airway Findings      Mallampati: II      TM distance: <3 FB      Neck ROM: full      Mouth opening: limited      Airway patency: adequate    Dental Findings:       Upper dentures and lower dentures    Cardiovascular Findings:       Rhythm: regular      Rate: normal      No murmur    Pulmonary Findings:       Breath sounds clear to auscultation.      Comments: O2 Sat 92% RA; placed on O2 per NC    Abdominal Findings:         Abdominal exam deferred    Neurological Findings:       Alert and oriented x 3    Constitutional findings:       No acute distress    Other Findings: Jaundiced       Diagnostic Tests  Hematology:   Lab Results   Component Value Date    HGB 7.0 09/02/2019    HCT 21.2 09/02/2019    PLTCT  199 09/02/2019    WBC 4.2 09/02/2019    NEUT 71.3 08/31/2019    ANC 3.8 08/31/2019    LYMPH 15.3 08/31/2019    ALC 0.8 08/31/2019    MONA 11 08/12/2019    AMC 0.4 08/31/2019    EOSA 2 08/12/2019    ABC 0.1 08/31/2019    BASOPHILS 1.5 08/31/2019    MCV 88.4 09/02/2019    MCH 29.0 09/02/2019    MCHC 32.8 09/02/2019 MPV 8.4 09/02/2019    RDW 18.2 09/02/2019         General Chemistry:   Lab Results   Component Value Date    NA 135 09/07/2019    K 4.2 09/07/2019    CL 102 09/07/2019    CO2 25.0 09/07/2019    GAP 12 09/07/2019    BUN 22.0 09/07/2019    CR 1.51 09/07/2019    GLU 104 09/07/2019    CA 8.1 09/07/2019    ALBUMIN 2.3 09/07/2019    LACTIC 1.8 08/12/2019    MG 2.4 08/14/2019    TOTBILI 1.19 09/07/2019    PO4 3.2 10/28/2018      Coagulation:   Lab Results   Component Value Date    PT 14.4 09/07/2019    PTT 29.4 08/12/2019    INR 1.3 09/07/2019         Anesthesia Plan    ASA score: 4   Plan: MAC  Induction method: intravenous  NPO status: acceptable      Informed Consent  Anesthetic plan and risks discussed with patient.        Plan discussed with: CRNA and anesthesiologist.  ATTESTATION    I have reviewed key portions of the evaluation with the patient/parent/guardian and Agree, I have examined the patient's airway, heart, and lungs and  Agree, The anesthesia plan is  MAC (Monitored Anesthesia Care) and ASA Classification:   ASA IV    Staff name:  Audelia Hives, MD Date:  09/10/2019

## 2019-09-11 ENCOUNTER — Encounter: Admit: 2019-09-11 | Discharge: 2019-09-11 | Payer: MEDICARE

## 2019-09-11 ENCOUNTER — Ambulatory Visit: Admit: 2019-09-11 | Discharge: 2019-09-11 | Payer: MEDICARE

## 2019-09-11 DIAGNOSIS — J329 Chronic sinusitis, unspecified: Secondary | ICD-10-CM

## 2019-09-11 DIAGNOSIS — R1900 Intra-abdominal and pelvic swelling, mass and lump, unspecified site: Secondary | ICD-10-CM

## 2019-09-11 DIAGNOSIS — Z01810 Encounter for preprocedural cardiovascular examination: Secondary | ICD-10-CM

## 2019-09-11 DIAGNOSIS — R188 Other ascites: Secondary | ICD-10-CM

## 2019-09-11 DIAGNOSIS — R601 Generalized edema: Secondary | ICD-10-CM

## 2019-09-11 DIAGNOSIS — D649 Anemia, unspecified: Secondary | ICD-10-CM

## 2019-09-11 DIAGNOSIS — K7460 Unspecified cirrhosis of liver: Secondary | ICD-10-CM

## 2019-09-11 DIAGNOSIS — E119 Type 2 diabetes mellitus without complications: Secondary | ICD-10-CM

## 2019-09-11 DIAGNOSIS — I729 Aneurysm of unspecified site: Secondary | ICD-10-CM

## 2019-09-11 DIAGNOSIS — E669 Obesity, unspecified: Secondary | ICD-10-CM

## 2019-09-11 DIAGNOSIS — D500 Iron deficiency anemia secondary to blood loss (chronic): Secondary | ICD-10-CM

## 2019-09-11 DIAGNOSIS — K760 Fatty (change of) liver, not elsewhere classified: Secondary | ICD-10-CM

## 2019-09-11 DIAGNOSIS — K31819 Angiodysplasia of stomach and duodenum without bleeding: Secondary | ICD-10-CM

## 2019-09-11 DIAGNOSIS — R0600 Dyspnea, unspecified: Secondary | ICD-10-CM

## 2019-09-11 DIAGNOSIS — I10 Essential (primary) hypertension: Secondary | ICD-10-CM

## 2019-09-11 DIAGNOSIS — K5730 Diverticulosis of large intestine without perforation or abscess without bleeding: Secondary | ICD-10-CM

## 2019-09-11 LAB — COMPREHENSIVE METABOLIC PANEL
Lab: 1 mg/dL — ABNORMAL HIGH (ref 0.3–1.2)
Lab: 1.2 mg/dL — ABNORMAL HIGH (ref 0.4–1.00)
Lab: 104 MMOL/L (ref 98–110)
Lab: 136 MMOL/L — ABNORMAL LOW (ref 137–147)
Lab: 139 U/L — ABNORMAL HIGH (ref 25–110)
Lab: 18 U/L (ref 7–56)
Lab: 2.5 g/dL — ABNORMAL LOW (ref 3.5–5.0)
Lab: 25 MMOL/L (ref 21–30)
Lab: 26 mg/dL — ABNORMAL HIGH (ref 7–25)
Lab: 3.6 MMOL/L (ref 3.5–5.1)
Lab: 34 U/L (ref 7–40)
Lab: 44 mL/min — ABNORMAL LOW (ref >60)
Lab: 5.7 g/dL — ABNORMAL LOW (ref 6.0–8.0)
Lab: 53 mL/min — ABNORMAL LOW (ref >60)
Lab: 7 K/UL (ref 3–12)
Lab: 73 mg/dL — ABNORMAL HIGH (ref 70–100)
Lab: 8.2 mg/dL — ABNORMAL LOW (ref 8.5–10.6)

## 2019-09-11 LAB — CBC AND DIFF
Lab: 2.5 M/UL — ABNORMAL LOW (ref 4.0–5.0)
Lab: 21 % — ABNORMAL LOW (ref 36–45)
Lab: 6 10*3/uL (ref 4.5–11.0)

## 2019-09-11 LAB — POC GLUCOSE: Lab: 78 mg/dL (ref 70–100)

## 2019-09-11 LAB — PROTIME INR (PT): Lab: 1.4 g/dL — ABNORMAL HIGH (ref 0.8–1.2)

## 2019-09-11 MED ORDER — FUROSEMIDE 20 MG PO TAB
60 mg | ORAL_TABLET | Freq: Every morning | ORAL | 3 refills | 90.00000 days | Status: DC
Start: 2019-09-11 — End: 2019-11-18

## 2019-09-11 MED ORDER — SPIRONOLACTONE 50 MG PO TAB
100 mg | ORAL_TABLET | Freq: Every day | ORAL | 1 refills | 90.00000 days | Status: DC
Start: 2019-09-11 — End: 2019-11-18

## 2019-09-11 MED ORDER — ALBUMIN, HUMAN 25 % IV SOLP
0 refills | Status: CP
Start: 2019-09-11 — End: ?
  Administered 2019-09-11: 19:00:00 25 g via INTRAVENOUS

## 2019-09-11 NOTE — Patient Instructions
INTERVENTIONAL RADIOLOGY DISCHARGE INSTRUCTIONS  PARACENTESIS  ?A paracentesis is the removal of an abnormal buildup of fluid in your abdominal cavity.? This fluid buildup is called ascites and may be caused by conditions such as liver disease, heart failure, or cancer.During this procedure, a needle is inserted into your abdomen to drain the fluid.? The fluid may then be sent to the lab for testing if medically indicated.? Removal of the fluid may also relieve belly pressure and shortness of breath caused by the ascites.?  POST-PROCEDURE ACTIVITY:  ? A responsible adult must drive you home.? ?If you receive sedation, you should not drive or operate heavy machinery or do anything that requires concentration for at least 24 hours after the procedure.  ? It is recommended that a responsible adult be with you until morning.  ? Rest today; you may resume normal activity tomorrow.?  POST-PROCEDURE SITE CARE:  ? Keep the bandage dry.? You may remove it after 24 hours.?  ? A dry gauze bandage may be reapplied as necessary to protect your clothing as the site may sometimes leak for several days after the procedure.?  ? You may shower in 24 hours, after the bandage is removed.?  ? Do not submerge the site underwater for 1 week or until fully healed (no tub bath, swimming/hot tub, etc.)?  ? Be sure your hands are clean when touching near the site.  ? Do not use ointments, creams or powders on the puncture site.?  DIET/MEDICATIONS:  ? You may resume your previous diet after the procedure.  ? If you receive sedation or narcotic pain medications, avoid any foods or beverages containing alcohol for at least 24 hours after the procedure.  ? Please see the Medication Reconciliation sheet for instructions on resuming your home medications.?  CALL THE DOCTOR IF:  ? Bright red blood has soaked the bandage.  ? You have severe abdominal pain unrelieved by pain medications.? Some soreness is to be expected. ? You have blood in your urine.  ? You have signs of infection:  ? Chills, fever greater than 101F.  ? Increased redness, warmth or swelling at the puncture site.  ? Pus draining from the puncture site.?  ?  For any of the above symptoms or for problems or concerns related to the procedure,? call? [phone] for Monday-Friday 7-5.? After-hours and weekends, please call? [phone] and ask for the Interventional Radiology Resident on-call.  ?

## 2019-09-11 NOTE — H&P (View-Only)
Pre Procedure History and Physical/Sedation Plan      Procedure Date:  09/11/2019    Planned Procedure(s): Ultrasound guided paracentesis     Indication for exam: Therapeutic/diagnostic; hx of NASH cirrhosis   ________________________________________________________________    Chief Complaint:  Abdominal distension     Previous Anesthetic/Sedation History:  Reviewed     Code Status: Full Code    Allergies:  Adhesive tape (rosins); Contrast dye iv, iodine containing [iodinated contrast media]; Sulfa (sulfonamide antibiotics); Codeine; Morphine; Penicillin g; and Tramadol  Medications:  Scheduled Meds:Continuous Infusions:  PRN and Respiratory Meds:       Vital Signs:  Last Filed Vital Signs: 24 Hour Range           Pre-procedure anxiolysis plan: Na local only   Sedation/Medication Plan: Lidocaine  Personal history of sedation complications: Denies adverse event.   Family history of sedation complications: Denies adverse event.   Medications for Reversal: None  Discussion/Reviews:  Physician has discussed risks and alternatives of this type of sedation and above planned procedures with patient    NPO Status: Acceptable  Airway:  airway assessment performed  Mallampati II (soft palate, uvula, fauces visible)  Head and Neck: no abnormalities noted  Mouth: no abnormalities noted   Anesthesia Classification:  ASA III (A patient with a severe systemic disease that limits activity, but is not incapacitating)  Pregnancy Status: N/A    Lab/Radiology/Other Diagnostic Tests  Labs:  Relevant labs reviewed    I have examined the patient, and there are no significant changes in their condition, from the previous H&P performed on 08/21/2019.    Vernetta Honey, APRN-NP  Pager [phone]

## 2019-09-11 NOTE — Other
Immediate Post Procedure Note    Date:  09/11/2019                                         Attending Physician:   Dr. Tina Griffiths  Performing Provider:  Philipp Deputy, MD    Consent:  Consent obtained from patient.  Time out performed: Consent obtained, correct patient verified, correct procedure verified, correct site verified, patient marked as necessary.  Pre/Post Procedure Diagnosis:  Ascites  Indications:  Ascites    Anesthesia: Local 15 mL 2% lidocaine without epinephrine  Procedure(s):  paracentesis  Findings:  Yellow ascites fluid     Estimated Blood Loss:  None/Negligible  Specimen(s) Removed/Disposition:  Yes, sent to pathology  Complications: None  Patient Tolerated Procedure: Well  Post-Procedure Condition:  stable    Philipp Deputy, MD  Pager

## 2019-09-14 ENCOUNTER — Encounter: Admit: 2019-09-14 | Discharge: 2019-09-14 | Payer: MEDICARE

## 2019-09-14 ENCOUNTER — Ambulatory Visit: Admit: 2019-09-14 | Discharge: 2019-09-15 | Payer: MEDICARE

## 2019-09-14 DIAGNOSIS — D649 Anemia, unspecified: Secondary | ICD-10-CM

## 2019-09-14 NOTE — Patient Instructions
HEART FAILURE INFUSION CLINIC  OUTPATIENT POST TRANSFUSION INSTRUCTIONS     During your transfusion you were monitored by nursing staff for signs of a transfusion reaction, however, you should continue to observe for the following symptoms after you have been dismissed from the clinic:     FEVER OF 100.5 OR HIGHER  CHILLS  NAUSEA OR VOMITING  FEELING FAINT OR DIZZY  SHORTNESS OF BREATH  DARK OR RED COLORED URINE  CHEST OR BACK PAIN  SHOCK OR LOSS OF CONSCIOUSNESS  YELLOWING OF THE EYES OR SKIN     If you or your caregiver notice any of these symptoms, contact your ordering physician immediately and go to the nearest Emergency Department for treatment. When you arrive at the Emergency Department notify them that you recently received a blood transfusion. *For more detailed signs and symptoms of a transfusion reaction, please refer to the [place] education information below.          When You Need a Blood Transfusion (Adult)  A blood transfusion may be done when you have lost blood because of an injury or during surgery. It can also be done because of diseases or conditions that affect the blood. Blood is made up of several different parts (blood products). You may receive some or all of these blood products during a transfusion. Blood for transfusion is usually donated from another person (donor). Strict measures are taken to make sure that donated blood is safe before it's given to you. This sheet helps you understand how a blood transfusion is done. Your healthcare provider will discuss your condition with you and answer your questions.   The parts of blood  Blood can be broken down into different parts that perform special roles in the body. These parts include:  ? Red blood cells, which carry oxygen throughout the body.  ? Platelets, which help stop bleeding.  ? Plasma (the liquid part of blood), which carries red blood cells and platelets throughout the body. Plasma also helps platelets in stopping bleeding. Where does donated blood come from?  ? Volunteer donors. These are people who donate their blood to help others in need of blood. Blood donation can take place at several places, including a hospital, blood bank, or during a blood drive.  ? Directed donation. If you need a blood transfusion during a planned surgery, family and friends can have their blood tested for compatibility and donate blood for you before the surgery. This needs to be done at least 7 day(s) in advance. This is because the blood must be tested for safety.  ? Autologous donation. This is also called self-donation. For planned surgery, you can donate your own blood starting up to 6 weeks before surgery.  Are blood transfusions safe?  Donated blood is tested and processed to make sure that the blood is safe:  ? The health and medical history of each donor is carefully screened. If a person is considered high-risk for infection or problems, he or she isn't accepted as a blood donor.  ? Donated blood is tested for infections such as hepatitis, syphilis, and HIV (the virus that causes AIDS). If the tested blood is found to be unsafe, it's destroyed.  ? Blood is divided into four types: A, B, AB, and O. Blood also has Rh types: positive (+) and negative (-). You can only receive blood products that are compatible with (match) your blood type. A sample of your blood is tested for compatibility with donated blood. This is  done before blood products are prepared for a transfusion.  How is a blood transfusion done?  A blood transfusion takes place in a blood center, infusion center, hospital room, or operating room. Your healthcare provider will discuss the blood transfusion with you before it's done. You'll need to give permission for the blood transfusion by signing a consent form.  ? Two healthcare providers confirm your identity. They also confirm that they have the correct blood product(s) for you. ? An intravenous (IV) line is placed in a vein if you do not already have an IV.  ? The blood product comes in a plastic bag that is hung on an IV pole. The blood product flows from the bag into your IV line. The IV line may be connected to a pump, which controls the transfusion rate. You may receive more than one kind of blood product through the IV.  ? Your vital signs (blood pressure, heart rate, respiratory rate, and temperature) are checked throughout the transfusion. This is to make sure you are not having a reaction to the blood product.  ? The IV line may be removed once the transfusion is complete.  Possible risks and complications of blood transfusions  Most transfusions are problem free. In some cases, reactions occur. These can happen within seconds to minutes during the transfusion or a week to a few months after the transfusion. Call your doctor or nurse right away if you have any of the signs or symptoms in the table below during or after a transfusion:  Reaction Timing Symptoms   Allergic reaction (mild) ? Within seconds to minutes during the transfusion  ? Up to 24 hours after the transfusion Hives or red welts on the skin, mild itching, rash, localized swelling, flushing (red face), wheezing, shortness of breath, or stridor (high-pitched noise or sound)   Anaphylactic reaction ? Within seconds to minutes during the transfusion  ? Up to 24 hours after the transfusion Shortness of breath, flushing (red face), wheezing, labored (working hard) breathing, low blood pressure, localized swelling, chest tightness, or cramps   Febrile nonhemolytic reaction ? Within minutes to hours during the transfusion  ? Within a few hours to 24 hours after the transfusion Fever (increase of 1? C or higher), chills, flushing (red face), nausea, headache, minor discomfort, or mild shortness of breath   Acute immune hemolytic reaction ? Within minutes during the transfusion ? Up to 24 hours after the transfusion Fever, red or brown urine, back pain, fast heart rate (tachycardia), abdominal pain, low blood pressure, feeling anxious, chills, chest pain, nausea, or fainting spells   Transfusion-related acute lung injury (TRALI) ? Within 1 to 2 hours during the transfusion  ? Up to 6 hours after the transfusion Shortness of breath, trouble breathing, low blood pressure, fever, pulmonary edema   Transfusion-associated circulatory overload ? Near the end of the transfusion  ? Within 6 hours after the transfusion Shortness of breath, fast heart rate (tachycardia), problems breathing when lying on back, abnormal blood pressure   Post-transfusion purpura (PUP) ? Within 1 week  ? Up to 48 days after the transfusion Purple spots on skin; nose bleed; bleeding from the urinary tract, abdomen, colon, or rectum; fever; or chills     Delayed transfusion-related acute lung injury (TRALI) ? Within 72 hours (3 days) after the transfusion Sudden onset of respiratory distress or trouble breathing   Delayed hemolytic reaction ? Within 3 to 7 days  ? Up to weeks after the transfusion Low-grade  fever, mild jaundice (yellowing of the skin and whites of the eyes), decrease in hematocrit, chills, chest pain, back pain, nausea   ? 2000-2019 [employer], [place]. [address], [place], [place] [address]. All rights reserved. This information is not intended as a substitute for professional medical care. Always follow your healthcare professional's instructions.

## 2019-09-15 DIAGNOSIS — K7460 Unspecified cirrhosis of liver: Secondary | ICD-10-CM

## 2019-09-15 DIAGNOSIS — K31819 Angiodysplasia of stomach and duodenum without bleeding: Secondary | ICD-10-CM

## 2019-09-21 ENCOUNTER — Encounter: Admit: 2019-09-21 | Discharge: 2019-09-21 | Payer: MEDICARE

## 2019-09-21 DIAGNOSIS — K7460 Unspecified cirrhosis of liver: Secondary | ICD-10-CM

## 2019-09-21 LAB — COMPREHENSIVE METABOLIC PANEL
Lab: 156
Lab: 1.1 mg/dL
Lab: 1.3 mg/dL — ABNORMAL HIGH (ref 0.57–1.11)
Lab: 106 mmol/L
Lab: 12 meq/L
Lab: 137 mmol/l
Lab: 163 U/L — ABNORMAL HIGH (ref 40–150)
Lab: 2.4 g/dL — ABNORMAL LOW (ref 3.4–4.8)
Lab: 22 U/L
Lab: 23 mmol/L
Lab: 27 mg/dL — ABNORMAL HIGH (ref 9.8–20.1)
Lab: 4.3 mmol/L
Lab: 43 U/L — ABNORMAL HIGH (ref 5–34)
Lab: 43 mL/min/{1.73_m2} — ABNORMAL LOW (ref >59)
Lab: 6.3 g/dL
Lab: 8.8 mg/dL

## 2019-09-21 LAB — PROTIME INR (PT)
Lab: 1.3 — ABNORMAL LOW (ref 2.0–3.5)
Lab: 14 s — ABNORMAL HIGH (ref 9.9–12.6)

## 2019-09-22 ENCOUNTER — Encounter: Admit: 2019-09-22 | Discharge: 2019-09-22 | Payer: MEDICARE

## 2019-09-22 DIAGNOSIS — K31819 Angiodysplasia of stomach and duodenum without bleeding: Secondary | ICD-10-CM

## 2019-09-22 DIAGNOSIS — R945 Abnormal results of liver function studies: Secondary | ICD-10-CM

## 2019-09-23 ENCOUNTER — Encounter: Admit: 2019-09-23 | Discharge: 2019-09-23 | Payer: MEDICARE

## 2019-09-23 DIAGNOSIS — K31819 Angiodysplasia of stomach and duodenum without bleeding: Secondary | ICD-10-CM

## 2019-09-23 LAB — CBC AND DIFF
Lab: 15 — ABNORMAL LOW (ref 18.0–47.0)
Lab: 212
Lab: 81
Lab: 1.1 10*3/uL (ref 0.9–5.1)
Lab: 1.5 % (ref 0.0–2.0)
Lab: 2.8 uL — ABNORMAL LOW (ref 4.20–5.40)
Lab: 20 % — ABNORMAL HIGH (ref 11.5–14.5)
Lab: 23 % — ABNORMAL LOW (ref 37.0–47.0)
Lab: 25 pg — ABNORMAL LOW (ref 27.0–31.0)
Lab: 3.2 %
Lab: 30 %
Lab: 4.9 10*3/uL
Lab: 69 % (ref 40.0–75.0)
Lab: 7 uL (ref 4.8–10.8)
Lab: 7.1 g/dL — ABNORMAL LOW (ref 12.0–16.0)
Lab: 9.9 % (ref 0.0–10.0)

## 2019-09-25 ENCOUNTER — Encounter: Admit: 2019-09-25 | Discharge: 2019-09-25 | Payer: MEDICARE

## 2019-09-25 DIAGNOSIS — D500 Iron deficiency anemia secondary to blood loss (chronic): Secondary | ICD-10-CM

## 2019-09-25 DIAGNOSIS — K31819 Angiodysplasia of stomach and duodenum without bleeding: Secondary | ICD-10-CM

## 2019-09-25 DIAGNOSIS — K7469 Other cirrhosis of liver: Secondary | ICD-10-CM

## 2019-09-25 DIAGNOSIS — K760 Fatty (change of) liver, not elsewhere classified: Secondary | ICD-10-CM

## 2019-09-25 NOTE — Patient Education
Dear Ms Kristin Moody,    Thank you for choosing [facility] Interventional Radiology for your procedure. Your appointment information is listed below:  Appointment Date: 09/28/19  Appointment Time: 1:00pm  Arrival Time: 12:00pm  Location:     ? [place]: [address] [place], [place]  [address]  Parking: [address]    INTERVENTIONAL RADIOLOGY  PRE-PROCEDURE INSTRUCTIONS     You are scheduled for a procedure in Interventional Radiology.  Please follow these instructions and any direction from your Primary Care/Managing Physician.  If you have questions about your procedure or need to reschedule please call [phone].    Medication Instructions:   You may take the following medications with a small sip of water:  Continue your regular medications as directed.     Diet Instructions: No dietary restrictions   Day of Exam Instructions:  1. Bathe or shower with an antibacterial soap prior to your appointment.  2. Bring a list of your current medications and the dosages.  3. Wear comfortable clothing and leave valuables at home.  4. Arrive 1 hour prior to your appointment.  This time will be spent registering, interviewing, assessing, educating and preparing you for the test.  ? You will be with [place] anywhere from 30 minutes to 6 hours after your exam depending on your procedure.    Interventional Radiology Team  Perioperative and Procedural Scheduling Department  [facility]  Ph: [phone]    Kalman Shan BSN, RN,

## 2019-09-25 NOTE — Telephone Encounter
pt called asking you to call her back.

## 2019-09-25 NOTE — Progress Notes
Interventional Radiology Outpatient Scheduling Checklist      1.  Name of Procedure(s):   Paracentesis      2.  Date of Procedure:   09/28/19      3.  Arrival Time: 1200        4.  Procedure Time:  9485      4.  Correct Procedural Room Assignment:  IR BH rm 7      6.  Blood Thinners Triaged and instructed per protocol: Y/N/NA:  NA  Confirmed accurate instructions sent to patient: Y/N:  NA       7.  Procedure Order Verified: Y/N:  Yes      9.  Patient instructed to have a driver: Y/N/NA:  Yes    10.  Patient instructed on NPO status: Y/N/NA:  No dietary restrictions   Confirmed accurate instructions sent to patient: Y/N:  Yes    11.  Specimen needed: Y/N/NA:  Yes   Verified Order placed: Y/N:  Yes    12.  Allergies Verified:  Y/N:  Yes    13.  Is there an Iodine Allergy: Y/N:  No  Does the Procedure Require contrast: Y/N:  no   If so, was the IR- Contrast Allergy Pre-Procedure Medication protocol ordered: Y/NA:  NA    14.  Does the patient have labs according to IR Pre-procedure Laboratory Parameter policy: Y/N/NA:  Yes  If No, was the patient instructed to obtain labs prior to procedure: Y/N/NA:  NA     15.  Will the patient need to be admitted or have a possible admission: Y/N:  No  If yes, confirmed accurate instructions sent to patient: Y/N/NA:  NA     16.  Patient States Understanding: Y/N:  Yes    17.  History of OSA:  Y/N:  No  If yes, confirm request to bring CPAP sent to patient: Y/N/NA:  NA    18. Patient declines electronic procedure instructions: Y/N:  No

## 2019-09-25 NOTE — Telephone Encounter
call returned, patient c/o weight at 168lbs, previous weight on 10/22 was 150lbs at clinic visit. Patient diuretic dose at max with current creatinine levels. Patient c/o abdominal fluid build up. RN placed standing orders for para with MELD labs and fluid cell count/wound culture. Patient given IR # to schedule PRN.  labs from 11/2 reviewed with patient, hgb 7.1. Patient scheduled for iron infusion 11/11.

## 2019-09-28 ENCOUNTER — Ambulatory Visit: Admit: 2019-09-28 | Discharge: 2019-09-28 | Payer: MEDICARE

## 2019-09-28 ENCOUNTER — Observation Stay: Admit: 2019-09-28 | Discharge: 2019-09-28 | Payer: MEDICARE

## 2019-09-28 ENCOUNTER — Encounter: Admit: 2019-09-28 | Discharge: 2019-09-28 | Payer: MEDICARE

## 2019-09-28 DIAGNOSIS — K760 Fatty (change of) liver, not elsewhere classified: Secondary | ICD-10-CM

## 2019-09-28 DIAGNOSIS — K5730 Diverticulosis of large intestine without perforation or abscess without bleeding: Secondary | ICD-10-CM

## 2019-09-28 DIAGNOSIS — J329 Chronic sinusitis, unspecified: Secondary | ICD-10-CM

## 2019-09-28 DIAGNOSIS — K7469 Other cirrhosis of liver: Secondary | ICD-10-CM

## 2019-09-28 DIAGNOSIS — E119 Type 2 diabetes mellitus without complications: Secondary | ICD-10-CM

## 2019-09-28 DIAGNOSIS — K31819 Angiodysplasia of stomach and duodenum without bleeding: Secondary | ICD-10-CM

## 2019-09-28 DIAGNOSIS — R0600 Dyspnea, unspecified: Secondary | ICD-10-CM

## 2019-09-28 DIAGNOSIS — D500 Iron deficiency anemia secondary to blood loss (chronic): Secondary | ICD-10-CM

## 2019-09-28 DIAGNOSIS — I10 Essential (primary) hypertension: Secondary | ICD-10-CM

## 2019-09-28 DIAGNOSIS — D649 Anemia, unspecified: Secondary | ICD-10-CM

## 2019-09-28 DIAGNOSIS — K7460 Unspecified cirrhosis of liver: Secondary | ICD-10-CM

## 2019-09-28 DIAGNOSIS — Z01810 Encounter for preprocedural cardiovascular examination: Secondary | ICD-10-CM

## 2019-09-28 DIAGNOSIS — E669 Obesity, unspecified: Secondary | ICD-10-CM

## 2019-09-28 DIAGNOSIS — I729 Aneurysm of unspecified site: Secondary | ICD-10-CM

## 2019-09-28 LAB — CBC AND DIFF
Lab: 0 K/UL (ref 0–0.20)
Lab: 0.1 10*3/uL (ref 0–0.45)
Lab: 0.5 10*3/uL (ref 0–0.80)
Lab: 0.8 10*3/uL — ABNORMAL LOW (ref 1.0–4.8)
Lab: 1 % (ref 0–2)
Lab: 11 % (ref 4–12)
Lab: 16 % — ABNORMAL LOW (ref 24–44)
Lab: 17 % — ABNORMAL LOW (ref 36–45)
Lab: 2 % (ref 0–5)
Lab: 2.3 M/UL — ABNORMAL LOW (ref 4.0–5.0)
Lab: 22 % — ABNORMAL HIGH (ref >60)
Lab: 222 10*3/uL (ref 150–400)
Lab: 24 pg — ABNORMAL LOW (ref 26–34)
Lab: 3.9 10*3/uL (ref 1.8–7.0)
Lab: 32 g/dL — ABNORMAL LOW (ref >60)
Lab: 5.6 K/UL — ABNORMAL LOW (ref 4.5–11.0)
Lab: 5.6 g/dL — CL (ref 12.0–15.0)
Lab: 70 % (ref 41–77)
Lab: 74 FL — ABNORMAL LOW (ref 80–100)
Lab: 8.3 FL (ref 7–11)

## 2019-09-28 LAB — COMPREHENSIVE METABOLIC PANEL
Lab: 0.9 mg/dL (ref 0.3–1.2)
Lab: 1.3 mg/dL — ABNORMAL HIGH (ref 0.4–1.00)
Lab: 106 MMOL/L (ref 98–110)
Lab: 136 MMOL/L — ABNORMAL LOW (ref 137–147)
Lab: 27 mg/dL — ABNORMAL HIGH (ref 7–25)
Lab: 3.6 MMOL/L (ref 3.5–5.1)
Lab: 5.6 g/dL — ABNORMAL LOW (ref 6.0–8.0)
Lab: 7.7 mg/dL — ABNORMAL LOW (ref 8.5–10.6)
Lab: 87 mg/dL (ref 70–100)

## 2019-09-28 LAB — PROTIME INR (PT): Lab: 1.4 — ABNORMAL HIGH (ref 0.8–1.2)

## 2019-09-28 MED ORDER — DULOXETINE 60 MG PO CPDR
60 mg | Freq: Every day | ORAL | 0 refills | Status: DC
Start: 2019-09-28 — End: 2019-09-29
  Administered 2019-09-29: 15:00:00 60 mg via ORAL

## 2019-09-28 MED ORDER — CEFTRIAXONE INJ 1GM IVP
1 g | INTRAVENOUS | 0 refills | Status: DC
Start: 2019-09-28 — End: 2019-09-29
  Administered 2019-09-29: 04:00:00 1 g via INTRAVENOUS

## 2019-09-28 MED ORDER — OCTREOTIDE IV DRIP
50 ug/h | INTRAVENOUS | 0 refills | Status: DC
Start: 2019-09-28 — End: 2019-09-29
  Administered 2019-09-29 (×4): 50 ug/h via INTRAVENOUS

## 2019-09-28 MED ORDER — RIFAXIMIN 550 MG PO TAB
550 mg | Freq: Two times a day (BID) | ORAL | 0 refills | Status: DC
Start: 2019-09-28 — End: 2019-09-29
  Administered 2019-09-29 (×2): 550 mg via ORAL

## 2019-09-28 MED ORDER — PANTOPRAZOLE 40 MG IV SOLR
40 mg | Freq: Two times a day (BID) | INTRAVENOUS | 0 refills | Status: DC
Start: 2019-09-28 — End: 2019-09-29
  Administered 2019-09-29: 04:00:00 40 mg via INTRAVENOUS

## 2019-09-28 MED ORDER — ZINC SULFATE 220 (50) MG PO CAP
220 mg | Freq: Every day | ORAL | 0 refills | Status: DC
Start: 2019-09-28 — End: 2019-09-29
  Administered 2019-09-29: 15:00:00 220 mg via ORAL

## 2019-09-28 MED ORDER — OCTREOTIDE (SANDOSTATIN) BOLUS FOR CONTINUOUS INFUSION
50 ug | Freq: Once | INTRAVENOUS | 0 refills | Status: CP
Start: 2019-09-28 — End: ?

## 2019-09-28 MED ORDER — ALBUMIN, HUMAN 25 % IV SOLP
0 refills | Status: CP
Start: 2019-09-28 — End: ?

## 2019-09-28 MED ORDER — ATORVASTATIN 10 MG PO TAB
10 mg | Freq: Every day | ORAL | 0 refills | Status: DC
Start: 2019-09-28 — End: 2019-09-29
  Administered 2019-09-29: 15:00:00 10 mg via ORAL

## 2019-09-28 MED ORDER — IMS MIXTURE TEMPLATE
60 mg | Freq: Every morning | ORAL | 0 refills | Status: DC
Start: 2019-09-28 — End: 2019-09-29

## 2019-09-28 MED ORDER — SPIRONOLACTONE 100 MG PO TAB
100 mg | Freq: Every day | ORAL | 0 refills | Status: DC
Start: 2019-09-28 — End: 2019-09-29

## 2019-09-28 NOTE — Progress Notes
Per IR NP paged Dr Lovena Le with HGB results, returned page to this nurse. Will send someone to admit her.

## 2019-09-28 NOTE — Discharge Instructions - Pharmacy
Pt discharged from IR. To report to admissions per Dr Lovena Le to be admitted. This nurse personally took patient to admissions.

## 2019-09-28 NOTE — H&P (View-Only)
Pre Procedure History and Physical/Sedation Plan    Procedure Date: 09/28/2019     Planned Procedure(s):  US guided paracentesis   Indication: Ascites   __________________________________________________________________    Chief Complaint: Abdominal distention     History of Present Illness: Kristin Moody is a 63 y.o. female. Pt presents to IR for paracentesis.     Patient Active Problem List    Diagnosis Date Noted   ? Encephalopathy 08/12/2019   ? Spasticity 08/10/2019   ? Hyperreflexia 08/10/2019   ? 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 Right leg weakness 05/14/2019   ? 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 Iron (Fe) deficiency anemia 01/19/2019   ? Preop cardiovascular exam 01/07/2019   ? Pre-transplant evaluation for liver transplant 01/06/2019   ? Cirrhosis (HCC) 12/16/2018   ? Acute on chronic anemia 12/16/2018   ? Lactic acidosis 12/16/2018   ? GI bleed 10/24/2018   ? Portal hypertension (HCC) 08/28/2018   ? Confusion 08/27/2018   ? Dyspnea 08/27/2018   ? Chronic abdominal pain 08/27/2018   ? 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 Acute on chronic blood loss anemia 08/27/2018   ? Iron deficiency anemia 04/13/2018   ? Pneumonia due to infectious organism 04/13/2018   ? Melena 04/10/2018   ? Esophageal varices without bleeding (HCC) 02/25/2018   ? Cirrhosis of liver without ascites (HCC) 02/25/2018   ? 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 Lower abdominal pain 10/24/2014   ? Chest discomfort 09/21/2014   ? Essential hypertension 08/13/2012   ? Abnormal liver function tests 05/13/2011   ? Fatty liver disease, nonalcoholic 05/13/2011   ? Obesity (BMI 30-39.9) 05/13/2011   ? Slow transit constipation 05/13/2011     Medical History:   Diagnosis Date   ? Aneurysm (HCC)    ? 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 Diverticulosis of colon     descending and sigmoid colon   ? Dyspnea ? Essential hypertension 08/13/2012   ? Fatty infiltration of liver    ? HTN (hypertension)    ? Obesity (BMI 30-39.9) 05/13/2011   ? Preop cardiovascular exam 01/07/2019   ? Sinus infection    ? Type 2 diabetes mellitus (HCC) 09/21/2014      Surgical History:   Procedure Laterality Date   ? HYSTERECTOMY  1994   ? UPPER GASTROINTESTINAL ENDOSCOPY  2009   ? COLONOSCOPY  2009   ? CYSTOCELE REPAIR  2009    with endocele repair   ? RECTOCELE REPAIR  03/2009   ? LIVER BIOPSY  07/17/2010   ? COLONOSCOPY N/A 11/08/2017    Performed by Dawna Part, MD at [facility] ENDO   ? ESOPHAGOGASTRODUODENOSCOPY N/A 11/08/2017    Performed by Dawna Part, MD at [facility] ENDO   ? ESOPHAGOGASTRODUODENOSCOPY WITH BIOPSY - FLEXIBLE  11/08/2017    Performed by Dawna Part, MD at [facility] ENDO   ? COLONOSCOPY WITH HOT BIOPSY FORCEPS REMOVAL TUMOR/ POLYP/ OTHER LESION  11/08/2017    Performed by Dawna Part, MD at [facility] ENDO   ? ESOPHAGOGASTRODUODENOSCOPY WITH BAND LIGATION ESOPHAGEAL/ GASTRIC VARICES - FLEXIBLE N/A 04/10/2018    Performed by Normajean Baxter, MD at [facility] ENDO   ? ESOPHAGOGASTRODUODENOSCOPY WITH BIOPSY - FLEXIBLE N/A 04/10/2018    Performed by Normajean Baxter, MD at [facility] ENDO   ? ESOPHAGOGASTRODUODENOSCOPY WITH CONTROL OF BLEEDING - FLEXIBLE N/A  04/25/2018    Performed by Jolee Ewing, MD at [facility] ENDO   ? ESOPHAGOGASTRODUODENOSCOPY WITH BIOPSY - FLEXIBLE with push enteroscopy N/A 08/28/2018    Performed by Celesta Gentile, MD at [facility] ENDO   ? EGD N/A 10/27/2018    Performed by Eliott Nine, MD at [facility] ENDO   ? ESOPHAGOGASTRODUODENOSCOPY WITH SNARE REMOVAL TUMOR/ POLYP/ OTHER LESION - FLEXIBLE N/A 10/27/2018    Performed by Eliott Nine, MD at [facility] ENDO   ? ESOPHAGOGASTRODUODENOSCOPY WITH BIOPSY - FLEXIBLE N/A 12/16/2018    Performed by Buckles, Vinnie Level, MD at [facility] ENDO   ? ESOPHAGOGASTRODUODENOSCOPY WITH CONTROL OF BLEEDING - FLEXIBLE N/A 12/16/2018    Performed by Buckles, Vinnie Level, MD at [facility] ENDO ? ESOPHAGOGASTRODUODENOSCOPY WITH SPECIMEN COLLECTION BY BRUSHING/ WASHING N/A 01/22/2019    Performed by Veneta Penton, MD at [facility] ENDO   ? ESOPHAGOGASTRODUODENOSCOPY WITH DILATION ESOPHAGUS WITH BALLOON 30 MM OR GREATER - FLEXIBLE N/A 01/22/2019    Performed by Veneta Penton, MD at [facility] ENDO   ? ESOPHAGOGASTRODUODENOSCOPY WITH BIOPSY - FLEXIBLE N/A 01/22/2019    Performed by Veneta Penton, MD at [facility] ENDO   ? ESOPHAGOGASTRODUODENOSCOPY WITH BIOPSY - FLEXIBLE N/A 06/19/2019    Performed by Dawna Part, MD at [facility] ENDO   ? ESOPHAGOGASTRODUODENOSCOPY [WITH APC]  WITH SPECIMEN COLLECTION BY BRUSHING/ WASHING N/A 08/07/2019    Performed by Dawna Part, MD at [facility] ENDO   ? ESOPHAGOGASTRODUODENOSCOPY WITH SPECIMEN COLLECTION BY BRUSHING/ WASHING N/A 09/10/2019    Performed by Buckles, Vinnie Level, MD at [facility] ENDO   ? ESOPHAGOGASTRODUODENOSCOPY WITH CONTROL OF BLEEDING - FLEXIBLE N/A 09/10/2019    Performed by Buckles, Vinnie Level, MD at [facility] ENDO   ? CHOLECYSTECTOMY     ? COLONOSCOPY     ? ESOPHAGOGASTRIC FUNDOPLICATION  2003, 2004    laparoscopic   ? ESOPHAGOGASTRIC FUNDOPLICATION     ? HX HYSTERECTOMY     ? PR ESOPHAGOSCOPY FLEXIBLE TRANSORAL DIAGNOSTIC        Medications Prior to Admission   Medication Sig Dispense Refill Last Dose   ? atorvastatin (LIPITOR) 10 mg tablet TAKE 1 TABLET BY MOUTH EVERY DAY 90 tablet 3    ? duloxetine DR (CYMBALTA) 60 mg capsule Take 60 mg by mouth daily.      ? ferrous sulfate (FEOSOL) 325 mg (65 mg iron) tablet Take one tablet by mouth twice daily. Take on an empty stomach at least 1 hour before or 2 hours after food. 90 tablet 1    ? furosemide (LASIX) 20 mg tablet Take three tablets by mouth every morning. 90 tablet 3    ? lactulose 10 gram/15 mL oral solution Take 30 mL by mouth as Needed. 236 mL 1    ? pantoprazole DR (PROTONIX) 40 mg tablet TAKE 1 TABLET BY MOUTH TWICE A DAY 180 tablet 1    ? polyethylene glycol 3350 (MIRALAX) 17 g packet Take one packet by mouth twice daily. (Patient taking differently: Take 17 g by mouth daily.)      ? promethazine (PHENERGAN) 25 mg tablet Take 25 mg by mouth every 4 hours as needed. Just uses it with Tramadol      ? rifAXIMin (XIFAXAN) 550 mg tablet Take one tablet by mouth twice daily. 60 tablet 1    ? spironolactone (ALDACTONE) 50 mg tablet Take two tablets by mouth daily. Take with food.  Indications: ascites, accumulation of fluid caused by cirrhosis of the liver 180 tablet  1    ? traMADoL (ULTRAM) 50 mg tablet Take 50 mg by mouth every 6 hours as needed for Pain.      ? zinc sulfate 220 mg (50 mg elemental zinc) capsule Take one capsule by mouth daily. 90 capsule 3      Allergies   Allergen Reactions   ? Adhesive Tape (Rosins) RASH   ? Contrast Dye Iv, Iodine Containing [Iodinated Contrast Media] RASH   ? Sulfa (Sulfonamide Antibiotics) RASH   ? Codeine NAUSEA AND VOMITING   ? Morphine SEE COMMENTS     Pt reports burns her veins   ? Penicillin G SEE COMMENTS     Throat issues, blisters in throat    ? Tramadol NAUSEA ONLY and ITCHING     Takes this medication regularly        Social History:   Social History     Tobacco Use   ? Smoking status: Never Smoker   ? Smokeless tobacco: Never Used   Substance Use Topics   ? Alcohol use: No      Family History   Problem Relation Age of Onset   ? Other Mother    ? Kidney Failure Mother    ? Osteoporosis Mother    ? Cancer Father         esophageal   ? Liver Disease Sister         endstage, s/p liver transplant   ? Cancer Brother         tonsil, liver    ? Hip Fracture Neg Hx         Review of Systems  See HPI    Previous Anesthetic/Sedation History:  N/A-procedure done with local anesthetic    Physical Exam:  Vital Signs: Last Filed In 24 Hours Vital Signs: 24 Hour Range                  General appearance: alert and cooperative  Neurologic: Grossly normal  Lungs: Nonlabored  Extremities: Grossly normal       Anesthesia Classification:  ASA II (A patient with a severe systemic disease that limits activity, but is not incapacitating)  Sedation/Medication Plan: Local anesthetic  Medications for Reversal: N/A-local anesthetic  Discussion/Reviews:  Physician has discussed risks and alternatives of this type of sedation and above planned procedures with patient  NPO Status: N/A Local anesthetic        Lab/Radiology/Other Diagnostic Tests:  Labs:  No pertinent labs to review           Auriella Wieand P Divine-Thiele, APRN-NP  Pager [phone]

## 2019-09-28 NOTE — Patient Instructions
INTERVENTIONAL RADIOLOGY DISCHARGE INSTRUCTIONS  FLUID ASPIRATION?  This procedure is done to remove fluid that has accumulated abnormally.? In this procedure, the Interventional Radiologist carefully places a small needle into the fluid collection using CT or ultrasound guidance.? The fluid is then aspirated or drawn off and sent to the lab for testing.? Your fluid collection is in the following area:??______________________________________________  POST-PROCEDURE ACTIVITY:  ? A responsible adult must drive you home.? If you receive sedation or anesthesia for this procedure, do not drive, operate heavy machinery or do anything that requires concentration for at least 24 hours.  ? It is recommended that a responsible adult be with you until tomorrow morning.  ? Other activity restrictions such as lifting restrictions will depend upon the area aspirated and will be determined by the physician after your procedure:_____________________________________________________________???????????????  POST-PROCEDURE SITE CARE:  ? You will have a small bandage over the procedure site.? Keep this dry.  ? You may remove the bandage in 24 hours.  ? You may shower after the bandage is removed.? Wash and dry the site gently.  ? Wash your hands thoroughly before touching near the site.  ? Do not use ointments, creams or powders on the procedure site.?  DIET/MEDICATIONS:  ? You may resume your previous diet after the procedure.  ? If you receive sedation or anesthesia for the procedure, avoid any foods or beverages containing alcohol for at least 24 hours.  ? Please see the Medication Reconciliation sheet for instructions regarding resuming your home medications.?  CALL THE DOCTOR IF:  ? Bright red blood has soaked the bandage.  ? You have severe pain not relieved by medication.? Some soreness at the site is to be expected.  ? You have signs of infection at the procedure site such as: Chills, body aches, fever greater than 101F, redness, swelling or warmth at the puncture site.  Yor or your caregiver should call 911?for any severe symptoms such as excessive bleeding, chest pain, severe?dizziness or loss of consciousness.?  For any of the above symptoms or for problems or concerns related to the procedure,? call? [phone] for Monday-Friday 7-5.? After-hours and weekends, please call? [phone] and ask for the Interventional Radiology Resident on-call.

## 2019-09-28 NOTE — H&P (View-Only)
Internal Medicine History and Physical 09/28/2019     Patient: Kristin Moody  MRN: [id number]    Admission date: 09/28/2019, LOS: 0 days  Admission diagnosis: Anemia [D64.9]    Attending: Dr. 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   -Hx of requiring blood transfusions, IV iron and oral iron  -Recent black, tarry stools, typical of her GI bleeds in the past-- associated with significant fatigue but no other symptoms  -Hgb 5.7 on admission    Plan  >Transfuse 1 unit pRBC  >Repeat CBC at 2200  >If <7, transfuse an additional unit  >Ceftriaxone 1 G q24h for SBP prevention in active GI bleed  >Octreotide 50 mcg bolus followed by 50 mcg/hr continuous infusion   >Pantoprazole 40 mg IV bid   >Clear liquid diet, NPO at midnight pending hepatology recommendations and possible need for intervention   >Will consider rechecking iron levels, however last levels 08/13/19 were within normal limits    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  Associated with small esophageal varices, episodic hepatic encephalopathy and ascites  MELD-Na score: 14 at 09/28/2019 12:47 PM  MELD score: 13 at 09/28/2019 12:47 PM  Calculated from:  Serum Creatinine: 1.39 MG/DL at 45/02/980 19:14 PM  Serum Sodium: 136 MMOL/L at 09/28/2019 12:47 PM  Total Bilirubin: 0.9 MG/DL (Rounded to 1 MG/DL) at 78/12/9560 13:08 PM INR(ratio): 1.4 at 09/28/2019 12:47 PM  Age: 23 years     Actively listed for orthotopic liver transplant  Home medications: spironolactone, lactulose prn, rifaximin bid, furosemide qday, polyethylene glycol qday    Plan  >Hepatology consulted 11/9 after 1700- will plan to see 11/10 am  >Continue spironolactone, furosemide and rifaximin  >Hold polyethylene glycol and lactulose in setting of acute upper GI bleed   >Follow up ascitic fluid cell count    Hx dysphagia with hx Nissen fundoplication  -March 2020: esophageal dilation  -No current issues    Celiac artery saccular aneurysm  -July 2020: underwent successful coiling      Fluids/electrolytes: no IV fluids  Lines/drains/airways: PIV  Diet: DIET NPO AT MIDNIGHT  DIET CLEAR LIQUID   Bowel regimen: polyethylene glycol   Pain regimen: N/A  Prophylaxis: hold in setting of acute blood loss anemia  Code status: Full Code   Disposition: admit to med 1 observation class    Discussed patient with Dr. 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     Patient reports presenting for outpatient IR paracentesis today, in which they removed 6 liters ascitic fluid. She also had routine labs drawn  that showed Hgb 5.7. She states she has not felt dizzy or lightheaded recently but has felt increasingly fatigued. She has noticed that her stools are black and tarry, which is typical for her GI bleeding. She believes her last blood transfusion was 2 weeks ago, as well as her last paracentesis, in which they only removed 3 liters. She reports the bleeding is an ongoing issue and in Sept 2020, she had an EGD with APC. She is currently comfortable. She denies recent fevers/chills, shortness of breath, nausea/vomiting, chest pain or issues with urination. She does have some mild abdominal discomfort from the paracentesis.     Review of systems  Review of Systems   Constitutional: Positive for malaise/fatigue. Negative for chills, diaphoresis and fever.   HENT: Negative for nosebleeds and sore throat.    Eyes: Negative for blurred vision, double vision and pain.   Respiratory: Negative for cough and shortness of breath.    Cardiovascular: Positive for leg swelling. Negative for chest pain.   Gastrointestinal: Positive for abdominal pain and melena. Negative for constipation, nausea and vomiting.   Genitourinary: Negative for frequency, hematuria and urgency.   Musculoskeletal: Negative for back pain and falls.   Skin: Negative for itching and rash.   Neurological: Negative for dizziness, speech change, focal weakness, loss of consciousness and headaches.   Psychiatric/Behavioral: Negative for memory loss. The patient is not nervous/anxious.      Medical History:   Diagnosis Date   ? Aneurysm (HCC)    ? 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 Diverticulosis of colon     descending and sigmoid colon   ? Dyspnea    ? Essential hypertension 08/13/2012   ? Fatty infiltration of liver    ? HTN (hypertension)    ? Obesity (BMI 30-39.9) 05/13/2011   ? Preop cardiovascular exam 01/07/2019   ? Sinus infection    ? Type 2 diabetes mellitus (HCC) 09/21/2014     Surgical History:   Procedure Laterality Date   ? HYSTERECTOMY  1994   ? UPPER GASTROINTESTINAL ENDOSCOPY  2009   ? COLONOSCOPY  2009   ? CYSTOCELE REPAIR  2009    with endocele repair   ? RECTOCELE REPAIR  03/2009   ? LIVER BIOPSY  07/17/2010   ? COLONOSCOPY N/A 11/08/2017    Performed by Dawna Part, MD at [facility] ENDO   ? ESOPHAGOGASTRODUODENOSCOPY N/A 11/08/2017    Performed by Dawna Part, MD at [facility] ENDO ? ESOPHAGOGASTRODUODENOSCOPY WITH BIOPSY - FLEXIBLE  11/08/2017    Performed by Dawna Part, MD at [facility] ENDO   ? COLONOSCOPY WITH HOT BIOPSY FORCEPS REMOVAL TUMOR/ POLYP/ OTHER LESION  11/08/2017    Performed by Dawna Part, MD at [facility] ENDO   ? ESOPHAGOGASTRODUODENOSCOPY WITH BAND LIGATION ESOPHAGEAL/ GASTRIC VARICES - FLEXIBLE N/A 04/10/2018    Performed by Normajean Baxter, MD at [facility] ENDO   ? ESOPHAGOGASTRODUODENOSCOPY WITH BIOPSY - FLEXIBLE N/A 04/10/2018    Performed by Normajean Baxter, MD at [facility] ENDO   ? ESOPHAGOGASTRODUODENOSCOPY WITH CONTROL OF BLEEDING - FLEXIBLE N/A 04/25/2018    Performed by Jolee Ewing, MD at [facility] ENDO   ? ESOPHAGOGASTRODUODENOSCOPY WITH BIOPSY - FLEXIBLE with push enteroscopy N/A 08/28/2018    Performed by Celesta Gentile, MD at [facility] ENDO   ? EGD N/A 10/27/2018    Performed by Eliott Nine, MD at [facility] ENDO   ? ESOPHAGOGASTRODUODENOSCOPY WITH SNARE REMOVAL TUMOR/ POLYP/ OTHER LESION -  FLEXIBLE N/A 10/27/2018    Performed by Eliott Nine, MD at [facility] ENDO   ? ESOPHAGOGASTRODUODENOSCOPY WITH BIOPSY - FLEXIBLE N/A 12/16/2018    Performed by Buckles, Vinnie Level, MD at [facility] ENDO   ? ESOPHAGOGASTRODUODENOSCOPY WITH CONTROL OF BLEEDING - FLEXIBLE N/A 12/16/2018    Performed by Buckles, Vinnie Level, MD at [facility] ENDO   ? ESOPHAGOGASTRODUODENOSCOPY WITH SPECIMEN COLLECTION BY BRUSHING/ WASHING N/A 01/22/2019    Performed by Veneta Penton, MD at [facility] ENDO   ? ESOPHAGOGASTRODUODENOSCOPY WITH DILATION ESOPHAGUS WITH BALLOON 30 MM OR GREATER - FLEXIBLE N/A 01/22/2019    Performed by Veneta Penton, MD at [facility] ENDO   ? ESOPHAGOGASTRODUODENOSCOPY WITH BIOPSY - FLEXIBLE N/A 01/22/2019    Performed by Veneta Penton, MD at [facility] ENDO   ? ESOPHAGOGASTRODUODENOSCOPY WITH BIOPSY - FLEXIBLE N/A 06/19/2019    Performed by Dawna Part, MD at [facility] ENDO   ? ESOPHAGOGASTRODUODENOSCOPY [WITH APC]  WITH SPECIMEN COLLECTION BY BRUSHING/ WASHING N/A 08/07/2019    Performed by Dawna Part, MD at [facility] ENDO ? ESOPHAGOGASTRODUODENOSCOPY WITH SPECIMEN COLLECTION BY BRUSHING/ WASHING N/A 09/10/2019    Performed by Buckles, Vinnie Level, MD at [facility] ENDO   ? ESOPHAGOGASTRODUODENOSCOPY WITH CONTROL OF BLEEDING - FLEXIBLE N/A 09/10/2019    Performed by Buckles, Vinnie Level, MD at [facility] ENDO   ? CHOLECYSTECTOMY     ? COLONOSCOPY     ? ESOPHAGOGASTRIC FUNDOPLICATION  2003, 2004    laparoscopic   ? ESOPHAGOGASTRIC FUNDOPLICATION     ? HX HYSTERECTOMY     ? PR ESOPHAGOSCOPY FLEXIBLE TRANSORAL DIAGNOSTIC       Family History   Problem Relation Age of Onset   ? Other Mother    ? Kidney Failure Mother    ? Osteoporosis Mother    ? Cancer Father         esophageal   ? Liver Disease Sister         endstage, s/p liver transplant   ? Cancer Brother         tonsil, liver    ? Hip Fracture Neg Hx      Social History     Tobacco Use   ? Smoking status: Never Smoker   ? Smokeless tobacco: Never Used   Substance Use Topics   ? Alcohol use: No       Allergies   Allergen Reactions   ? Adhesive Tape (Rosins) RASH   ? Contrast Dye Iv, Iodine Containing [Iodinated Contrast Media] RASH   ? Sulfa (Sulfonamide Antibiotics) RASH   ? Codeine NAUSEA AND VOMITING   ? Morphine SEE COMMENTS     Pt reports burns her veins   ? Penicillin G SEE COMMENTS     Throat issues, blisters in throat    ? Tramadol NAUSEA ONLY and ITCHING     Takes this medication regularly        Your Current Medications:       Instructions    atorvastatin (LIPITOR) 10 mg tablet TAKE 1 TABLET BY MOUTH EVERY DAY    duloxetine DR (CYMBALTA) 60 mg capsule Take 60 mg by mouth daily.    ferrous sulfate (FEOSOL) 325 mg (65 mg iron) tablet Take one tablet by mouth twice daily. Take on an empty stomach at least 1 hour before or 2 hours after food.    furosemide (LASIX) 20 mg tablet Take three tablets by mouth every morning.    lactulose 10 gram/15 mL oral solution  Take 30 mL by mouth as Needed.    pantoprazole DR (PROTONIX) 40 mg tablet TAKE 1 TABLET BY MOUTH TWICE A DAY polyethylene glycol 3350 (MIRALAX) 17 g packet Take one packet by mouth twice daily.    promethazine (PHENERGAN) 25 mg tablet Take 25 mg by mouth every 4 hours as needed. Just uses it with Tramadol    rifAXIMin (XIFAXAN) 550 mg tablet Take one tablet by mouth twice daily.    spironolactone (ALDACTONE) 50 mg tablet Take two tablets by mouth daily. Take with food.  Indications: ascites, accumulation of fluid caused by cirrhosis of the liver    traMADoL (ULTRAM) 50 mg tablet Take 50 mg by mouth every 6 hours as needed for Pain.    zinc sulfate 220 mg (50 mg elemental zinc) capsule Take one capsule by mouth daily.        Vitals  BP: (98-104)/(48-66)   Temp:  [36.7 ?C (98 ?F)]   Pulse:  [88-94]   Respirations:  [18 PER MINUTE-31 PER MINUTE]   SpO2:  [87 %-100 %]     Physical exam  General: alert, cooperative, NAD, slightly pale  HEENT: normocephalic/atraumatic, non-icteric, EOMI   Cardio: regular rate, regular rhythm, no murmur  Pulm: non-labored respirations on RA, clear to auscultation bilaterally  Abd: soft, non-distended, mild tenderness to palpation at paracentesis site  Ext: warm, dry, shiny, 2+ pitting edema  Vascular: palpable radial pulses bilaterally  Neuro: grossly intact  Psych: behavior and mood appropriate      Labs/Radiology/Other Diagnostic Tests  Lab Results   Component Value Date/Time    HGB 5.6 (LL) 09/28/2019 1247    HCT 17.3 (L) 09/28/2019 1247    WBC 5.6 09/28/2019 1247    INR 1.4 (H) 09/28/2019 1247    PLTCT 222 09/28/2019 1247     Lab Results   Component Value Date/Time    NA 136 (L) 09/28/2019 1247    K 3.6 09/28/2019 1247    CL 106 09/28/2019 1247    CO2 22 09/28/2019 1247    BUN 27 (H) 09/28/2019 1247    CR 1.39 (H) 09/28/2019 1247     No orders to display       Boris Sharper, MD  Pager [phone]

## 2019-09-28 NOTE — Progress Notes
@   1250:  Pt in [place] 19.  Pt is A&Ox4 and VSS on room air.  Pt denies any pain or n/v.  Pt's PIV is patent.  Labs drawn & sent per standing order.

## 2019-09-28 NOTE — Other
Immediate Post Procedure Note    Date:  09/28/2019                                         Attending Physician:  Cline Crock, MD    Consent:  Consent obtained from patient.  Time out performed: Consent obtained, correct patient verified, correct procedure verified, correct site verified, patient marked as necessary.  Pre/Post Procedure Diagnosis/Indication:  ascites    Anesthesia: local  Procedure(s):  paracentesis. Please see separate PACS dictation for further detail.  Findings: large volume ascites.    Estimated Blood Loss:  None/Negligible  Specimen(s) Removed/Disposition:  Clear yellow ascites. See dictation for volume removed.  Complications: None  Patient Tolerated Procedure: Well  Post-Procedure Condition:  Stable    Cline Crock MD

## 2019-09-28 NOTE — Telephone Encounter
Spoke to Kristin Moody. Patient to be direct admitted for hgb of 5.7.  Spoke to Dr. Onnie Graham, who approved direct admit. Patient will be admitted to internal medicine with hepatology referral for 23 hour observation, patient to receive blood product.  Spoke to IR RN who will discharge patient from IR and send to admissions until bed is available.

## 2019-09-29 ENCOUNTER — Encounter: Admit: 2019-09-29 | Discharge: 2019-09-29 | Payer: MEDICARE

## 2019-09-29 DIAGNOSIS — I729 Aneurysm of unspecified site: Secondary | ICD-10-CM

## 2019-09-29 DIAGNOSIS — R0600 Dyspnea, unspecified: Secondary | ICD-10-CM

## 2019-09-29 DIAGNOSIS — K7460 Unspecified cirrhosis of liver: Secondary | ICD-10-CM

## 2019-09-29 DIAGNOSIS — K5730 Diverticulosis of large intestine without perforation or abscess without bleeding: Secondary | ICD-10-CM

## 2019-09-29 DIAGNOSIS — E119 Type 2 diabetes mellitus without complications: Secondary | ICD-10-CM

## 2019-09-29 DIAGNOSIS — K760 Fatty (change of) liver, not elsewhere classified: Secondary | ICD-10-CM

## 2019-09-29 DIAGNOSIS — E669 Obesity, unspecified: Secondary | ICD-10-CM

## 2019-09-29 DIAGNOSIS — I10 Essential (primary) hypertension: Secondary | ICD-10-CM

## 2019-09-29 DIAGNOSIS — Z01810 Encounter for preprocedural cardiovascular examination: Secondary | ICD-10-CM

## 2019-09-29 DIAGNOSIS — J329 Chronic sinusitis, unspecified: Secondary | ICD-10-CM

## 2019-09-29 LAB — GRAM STAIN

## 2019-09-29 LAB — COVID-19 (SARS-COV-2) PCR

## 2019-09-29 MED ORDER — SPIRONOLACTONE 100 MG PO TAB
100 mg | Freq: Every day | ORAL | 0 refills | Status: DC
Start: 2019-09-29 — End: 2019-09-29
  Administered 2019-09-29: 18:00:00 100 mg via ORAL

## 2019-09-29 MED ORDER — EPINEPHRINE HCL (PF) 1 MG/ML (1 ML) IJ SOLN
.3-.5 mg | INTRAMUSCULAR | 0 refills | Status: DC | PRN
Start: 2019-09-29 — End: 2019-09-29

## 2019-09-29 MED ORDER — IRON SUCROSE 300 MG IRON/15 ML IV SOLN
300 mg | Freq: Once | INTRAVENOUS | 0 refills | Status: CP
Start: 2019-09-29 — End: ?
  Administered 2019-09-29 (×2): 300 mg via INTRAVENOUS

## 2019-09-29 MED ORDER — DIPHENHYDRAMINE HCL 50 MG/ML IJ SOLN
25 mg | INTRAVENOUS | 0 refills | Status: DC | PRN
Start: 2019-09-29 — End: 2019-09-29

## 2019-09-29 MED ORDER — IMS MIXTURE TEMPLATE
60 mg | Freq: Every morning | ORAL | 0 refills | Status: DC
Start: 2019-09-29 — End: 2019-09-29
  Administered 2019-09-29 (×2): 60 mg via ORAL

## 2019-09-29 MED ORDER — IRON SUCROSE 300 MG IRON/15 ML IV SOLN
300 mg | Freq: Once | INTRAVENOUS | 0 refills | Status: DC
Start: 2019-09-29 — End: 2019-09-29

## 2019-09-29 MED ORDER — SPIRONOLACTONE 50 MG PO TAB
50 mg | Freq: Every day | ORAL | 0 refills | Status: DC
Start: 2019-09-29 — End: 2019-09-29

## 2019-09-29 MED ORDER — PANTOPRAZOLE 40 MG PO TBEC
40 mg | Freq: Two times a day (BID) | ORAL | 0 refills | Status: DC
Start: 2019-09-29 — End: 2019-09-29
  Administered 2019-09-29: 18:00:00 40 mg via ORAL

## 2019-09-29 NOTE — Progress Notes
Patient arrived to room # [phone] via wheelchair accompanied by transport. Patient transferred to the bed without assistance. Bedside safety checks completed. Initial patient assessment completed. Refer to flowsheet for details.    Admission skin assessment completed with: York Cerise, RN    Pressure injury present on arrival?: No    1. Head/Face/Neck: No  2. Trunk/Back: No  3. Upper Extremities: No  4. Lower Extremities: No  5. Pelvic/Coccyx: No  6. Assessed for device associated injury? Yes  7. Malnutrition Screening Tool (Nursing Nutrition Assessment) Completed? Yes    See Doc Flowsheet for additional wound details.     INTERVENTIONS:

## 2019-09-29 NOTE — Progress Notes
RT Adult Assessment Note    NAME:Kristin Moody             MRN: [id number]             DOB:01/26/1956          AGE: 63 y.o.  ADMISSION DATE: 09/28/2019             DAYS ADMITTED: LOS: 0 days    RT Treatment Plan:       Protocol Plan: Procedures  Comment: criteria not met    Additional Comments:  Impressions of the patient: nad  Intervention(s)/outcome(s): none  Patient education that was completed: none  Recommendations to the care team: none    Vital Signs:  Pulse: 85  RR: 16 PER MINUTE  SpO2: 100 %  O2 Device:    Liter Flow:    O2%:    Breath Sounds: Clear (Implies normal)  Respiratory Effort: Non-Labored

## 2019-10-06 ENCOUNTER — Encounter: Admit: 2019-10-06 | Discharge: 2019-10-06 | Payer: MEDICARE

## 2019-10-06 DIAGNOSIS — K7460 Unspecified cirrhosis of liver: Secondary | ICD-10-CM

## 2019-10-06 DIAGNOSIS — K31819 Angiodysplasia of stomach and duodenum without bleeding: Secondary | ICD-10-CM

## 2019-10-06 DIAGNOSIS — R945 Abnormal results of liver function studies: Secondary | ICD-10-CM

## 2019-10-06 LAB — COMPREHENSIVE METABOLIC PANEL
Lab: 1 (ref 0.57–1.11)
Lab: 1.1
Lab: 101
Lab: 107
Lab: 11
Lab: 23 — ABNORMAL HIGH (ref 9.8–20.1)
Lab: 24
Lab: 25 (ref 23–31)
Lab: 42 — ABNORMAL HIGH (ref 5–34)
Lab: 57 — AB (ref >59)
Lab: 6.2
Lab: 8 — ABNORMAL LOW (ref 8.4–10.2)
Lab: 139 mmol/L (ref 136–145)
Lab: 167 U/L — ABNORMAL HIGH (ref 40–150)
Lab: 2.6 g/dL — ABNORMAL LOW (ref 3.4–4.8)
Lab: 3.9 mmol/L (ref 3.5–5.1)

## 2019-10-06 LAB — PROTIME INR (PT)
Lab: 1.3
Lab: 14 s — ABNORMAL HIGH (ref 9.9–12.6)

## 2019-10-07 ENCOUNTER — Encounter: Admit: 2019-10-07 | Discharge: 2019-10-07 | Payer: MEDICARE

## 2019-10-07 DIAGNOSIS — K31819 Angiodysplasia of stomach and duodenum without bleeding: Secondary | ICD-10-CM

## 2019-10-07 LAB — CBC AND DIFF
Lab: 0.1
Lab: 0.2
Lab: 0.6
Lab: 1.7
Lab: 10 — ABNORMAL HIGH (ref 0.0–10.0)
Lab: 15 — ABNORMAL LOW (ref 18.0–47.0)
Lab: 167
Lab: 22 — ABNORMAL HIGH (ref 11.5–14.5)
Lab: 4
Lab: 4.1
Lab: 68
Lab: 81
Lab: 0.9 10*3/uL (ref 0.9–5.1)
Lab: 24 pg — ABNORMAL LOW (ref 27.0–31.0)
Lab: 27 % — ABNORMAL LOW (ref 37.0–47.0)
Lab: 3.3 uL — ABNORMAL LOW (ref 4.20–5.40)
Lab: 30 % (ref 30.0–34.0)
Lab: 6 10*3/uL (ref 4.8–10.8)
Lab: 8.3 g/dL — ABNORMAL LOW (ref 12.0–16.0)

## 2019-10-11 NOTE — Progress Notes
Interventional Radiology Outpatient Scheduling Checklist     1. Name of Procedure(s): Paracentesis     2. Date of Procedure: 10/13/2019     3. Arrival Time:1030      4. Procedure Time: 3704     8. Correct Procedural Room Assignment: IR BH Room #7 SONO     6. Blood Thinners Triaged and instructed per protocol: Y/N/NA:  NA  Confirmed accurate instructions sent to patient: Y/N:  NA      7. Procedure Order Verified: Y/N:  Yes     9. Patient instructed to have a driver: Y/N/NA:  Yes    10. Patient instructed on NPO status: Y/N/NA:  NO Restrictions.  Confirmed accurate instructions sent to patient: Y/N:  Yes    11. Specimen needed: Y/N/NA:  Yes   Verified Order placed: Y/N:  Yes    12. Allergies Verified: Y/N: Yes    13. Is there an Iodine Allergy: Y/N: No  Does the Procedure Require contrast: Y/N:  no   If so, was the IR- Contrast Allergy Pre-Procedure Medication protocol ordered: Y/NA:  NA    14. Does the patient have labs according to IR Pre-procedure Laboratory Parameter policy: Y/N/NA:  Yes  If No, was the patient instructed to obtain labs prior to procedure: Y/N/NA:  NA     15. Will the patient need to be admitted or have a possible admission: Y/N:  No  If yes, confirmed accurate instructions sent to patient: Y/N/NA:  NA     16. Patient States Understanding:Y/N:  Yes    17. History of OSA: Y/N:  No  If yes, confirm request to bring CPAP sent to patient: Y/N/NA:  NA    18. Patient declines electronic procedure instructions: Y/N:  No

## 2019-10-11 NOTE — Patient Education
Dear Ms Dolores Patty,    Thank you for choosing [facility] Interventional Radiology for your procedure. Your appointment information is listed below:    Appointment Date: 10/13/2019  Appointment Time: 11:30AM  Arrival Time: 10:30AM  Location:     ? [place]: [address] [place], [place]  [address]  Parking: [address] Parking Garage      INTERVENTIONAL RADIOLOGY  PRE-PROCEDURE [place] are scheduled for a procedure in Interventional Radiology.  Please follow these instructions and any direction from your Primary Care/Managing Physician.  If you have questions about your procedure or need to reschedule please call [phone].      Medication Instructions:  Continue scheduled medication.       Diet Instructions: Maintain regular diet with no restrictions.     Day of Exam Instructions:  1. Bathe or shower with an antibacterial soap prior to your appointment.  2. Bring a list of your current medications and the dosages.  3. Wear comfortable clothing and leave valuables at home.  4. Arrive (1) hour prior to your appointment.  This time will be spent registering, interviewing, assessing, educating, and preparing you for the test.  ? You will be with [place] anywhere from 30 minutes to 2 hours after your exam depending on your procedure.  5. Depending on the procedure and as instructed by the nurse a responsible adult may be required to drive you home (no [place], taxis or buses are allowed). If a driver is required and you do not have one  we will be unable to perform your procedure.   6. The nurse will give you discharge instructions about your care and activities after the procedure.

## 2019-10-12 ENCOUNTER — Encounter: Admit: 2019-10-12 | Discharge: 2019-10-12 | Payer: MEDICARE

## 2019-10-12 NOTE — Telephone Encounter
Call returned to patient, lab draw can be done during para. Paitent advised to remind IR staff this needs done. standing orders previously placed.

## 2019-10-12 NOTE — Telephone Encounter
Call from [facility] regarding lab draw and if she can combine with tomorrow's paracentesis.  Please return her call.

## 2019-10-13 ENCOUNTER — Encounter: Admit: 2019-10-13 | Discharge: 2019-10-13 | Payer: MEDICARE

## 2019-10-13 ENCOUNTER — Ambulatory Visit: Admit: 2019-10-13 | Discharge: 2019-10-13 | Payer: MEDICARE

## 2019-10-13 DIAGNOSIS — I10 Essential (primary) hypertension: Secondary | ICD-10-CM

## 2019-10-13 DIAGNOSIS — R0600 Dyspnea, unspecified: Secondary | ICD-10-CM

## 2019-10-13 DIAGNOSIS — E669 Obesity, unspecified: Secondary | ICD-10-CM

## 2019-10-13 DIAGNOSIS — K760 Fatty (change of) liver, not elsewhere classified: Secondary | ICD-10-CM

## 2019-10-13 DIAGNOSIS — E119 Type 2 diabetes mellitus without complications: Secondary | ICD-10-CM

## 2019-10-13 DIAGNOSIS — K7469 Other cirrhosis of liver: Secondary | ICD-10-CM

## 2019-10-13 DIAGNOSIS — Z01810 Encounter for preprocedural cardiovascular examination: Secondary | ICD-10-CM

## 2019-10-13 DIAGNOSIS — K7460 Unspecified cirrhosis of liver: Secondary | ICD-10-CM

## 2019-10-13 DIAGNOSIS — J329 Chronic sinusitis, unspecified: Secondary | ICD-10-CM

## 2019-10-13 DIAGNOSIS — R945 Abnormal results of liver function studies: Secondary | ICD-10-CM

## 2019-10-13 DIAGNOSIS — K5730 Diverticulosis of large intestine without perforation or abscess without bleeding: Secondary | ICD-10-CM

## 2019-10-13 DIAGNOSIS — K31819 Angiodysplasia of stomach and duodenum without bleeding: Secondary | ICD-10-CM

## 2019-10-13 DIAGNOSIS — I729 Aneurysm of unspecified site: Secondary | ICD-10-CM

## 2019-10-13 DIAGNOSIS — D500 Iron deficiency anemia secondary to blood loss (chronic): Secondary | ICD-10-CM

## 2019-10-13 LAB — GRAM STAIN

## 2019-10-13 LAB — POC GLUCOSE: Lab: 105 mg/dL — ABNORMAL HIGH (ref 70–100)

## 2019-10-13 MED ORDER — ALBUMIN, HUMAN 25 % IV SOLP
0 refills | Status: CP
Start: 2019-10-13 — End: ?
  Administered 2019-10-13 (×3): 12.5 g via INTRAVENOUS

## 2019-10-13 NOTE — Progress Notes
Pt DC with instructions and follow-up care explained. Pt understands education and has no additional questions at this time. PIV removed and pt wheeled off unit by tech without difficulty.

## 2019-10-13 NOTE — Progress Notes
Patient informed of IR procedure and post procedure plan. All questions answered. Patient verbalized understanding.

## 2019-10-13 NOTE — Progress Notes
Patient meets criteria for discharge.   Patient left with all belongings and discharge paperwork.   Patient was taken by   Lorn Junes RN assistance        via wheelchair to personal vehicle and driven home by their friend/family member.

## 2019-10-13 NOTE — Patient Instructions
Discharge Instructions forParacentesis  Paracentesis is a procedure to remove extra fluid from your belly (abdomen). This fluid buildup in the abdomen is calledascites. The procedure may have been done to take a sample of the fluid. Or, it may have been done to drain the extra fluid from your abdomenand help make you more comfortable.     Ascites is buildup of excess fluid in the abdomen.   Home care   If you have pain after the procedure, your healthcare provider can prescribe or recommend pain medicines. Take these exactly as directed. If you stopped taking other medicines before the procedure, ask your provider when you can start them again.   Take it easy for 24 hours after the procedure. Don't do any physical activity until your provider says it's OK.   You will have a small bandage over the puncture site. Stitches, surgical staples, adhesive tapes, adhesive strips, or surgical glue may be used to close the incision. They also help stop bleeding and speed healing. You may take the bandage off in 24 hours.   Check the puncture site for the signs of infection listed below.    Follow-up care  Make a follow-up appointment with your healthcare provider as directed. During your follow-up visit, your provider will check your healing. Let your provider know how you are feeling. You can also discuss the cause of your ascites and if you need any further treatment. If your fluid is infected, you will be sent home on antibiotics. In some cases, the paracentesis may need to be repeated if the fluid returns. Your provider may also prescribe medicines that increase urination (diuretics) to decrease the buildup of fluid.  When to call your healthcare provider  Call your healthcare provider if you have any of the following after the procedure:   A fever of 100.4 F ( 38.0C) or higher, or as directed by your provider   Chills   Trouble breathing   Pain that doesn't go away even after taking pain medicine   Belly  pain not caused by having the skin punctured   Bleeding from the puncture site   More than a small amount of fluid leaking from the puncture site   Swollen belly   Signs of infection at the puncture site. These include increased pain, redness, or swelling, warmth, or bad-smelling drainage.   Blood in your urine   Feeling dizzy or lightheaded, or fainting  [employer] last reviewed this educational content on 08/19/2018   2000-2020 [employer] [address], [place] [address]. All rights reserved. This information is not intended as a substitute for professional medical care. Always follow your healthcare professional's instructions.

## 2019-10-13 NOTE — H&P (View-Only)
Pre Procedure History and Physical/Sedation Plan-OP    Procedure Date: 10/13/2019     Planned Procedure(s):  US guided paracentesis    Indication for exam:  Ascites  __________________________________________________________________    Chief Complaint:  Abdominal distension    History of Present Illness: Kristin Moody is a 63 y.o. 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    Patient Active Problem List    Diagnosis Date Noted   ? Anemia 09/28/2019   ? Encephalopathy 08/12/2019   ? Spasticity 08/10/2019   ? Hyperreflexia 08/10/2019   ? 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 Right leg weakness 05/14/2019   ? 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 Iron (Fe) deficiency anemia 01/19/2019   ? Preop cardiovascular exam 01/07/2019   ? Pre-transplant evaluation for liver transplant 01/06/2019   ? Cirrhosis (HCC) 12/16/2018   ? Acute on chronic anemia 12/16/2018   ? Lactic acidosis 12/16/2018   ? GI bleed 10/24/2018   ? Portal hypertension (HCC) 08/28/2018   ? Confusion 08/27/2018   ? Dyspnea 08/27/2018   ? Chronic abdominal pain 08/27/2018   ? 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 Acute on chronic blood loss anemia 08/27/2018   ? Iron deficiency anemia 04/13/2018   ? Pneumonia due to infectious organism 04/13/2018   ? Melena 04/10/2018   ? Esophageal varices without bleeding (HCC) 02/25/2018   ? Cirrhosis of liver without ascites (HCC) 02/25/2018   ? 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 Lower abdominal pain 10/24/2014   ? Chest discomfort 09/21/2014   ? Essential hypertension 08/13/2012   ? Abnormal liver function tests 05/13/2011   ? Fatty liver disease, nonalcoholic 05/13/2011   ? Obesity (BMI 30-39.9) 05/13/2011   ? Slow transit constipation 05/13/2011     Medical History:   Diagnosis Date   ? Aneurysm (HCC)    ? 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 Diverticulosis of colon descending and sigmoid colon   ? Dyspnea    ? Essential hypertension 08/13/2012   ? Fatty infiltration of liver    ? HTN (hypertension)    ? Obesity (BMI 30-39.9) 05/13/2011   ? Preop cardiovascular exam 01/07/2019   ? Sinus infection    ? Type 2 diabetes mellitus (HCC) 09/21/2014      Surgical History:   Procedure Laterality Date   ? HYSTERECTOMY  1994   ? UPPER GASTROINTESTINAL ENDOSCOPY  2009   ? COLONOSCOPY  2009   ? CYSTOCELE REPAIR  2009    with endocele repair   ? RECTOCELE REPAIR  03/2009   ? LIVER BIOPSY  07/17/2010   ? COLONOSCOPY N/A 11/08/2017    Performed by Dawna Part, MD at [facility] ENDO   ? ESOPHAGOGASTRODUODENOSCOPY N/A 11/08/2017    Performed by Dawna Part, MD at [facility] ENDO   ? ESOPHAGOGASTRODUODENOSCOPY WITH BIOPSY - FLEXIBLE  11/08/2017    Performed by Dawna Part, MD at [facility] ENDO   ? COLONOSCOPY WITH HOT BIOPSY FORCEPS REMOVAL TUMOR/ POLYP/ OTHER LESION  11/08/2017    Performed by Dawna Part, MD at [facility] ENDO   ? ESOPHAGOGASTRODUODENOSCOPY WITH BAND LIGATION ESOPHAGEAL/ GASTRIC VARICES - FLEXIBLE N/A 04/10/2018    Performed by Normajean Baxter, MD at [facility] ENDO   ? ESOPHAGOGASTRODUODENOSCOPY WITH BIOPSY - FLEXIBLE N/A 04/10/2018    Performed by Normajean Baxter, MD at [facility] ENDO   ?  ESOPHAGOGASTRODUODENOSCOPY WITH CONTROL OF BLEEDING - FLEXIBLE N/A 04/25/2018    Performed by Jolee Ewing, MD at [facility] ENDO   ? ESOPHAGOGASTRODUODENOSCOPY WITH BIOPSY - FLEXIBLE with push enteroscopy N/A 08/28/2018    Performed by Celesta Gentile, MD at [facility] ENDO   ? EGD N/A 10/27/2018    Performed by Eliott Nine, MD at [facility] ENDO   ? ESOPHAGOGASTRODUODENOSCOPY WITH SNARE REMOVAL TUMOR/ POLYP/ OTHER LESION - FLEXIBLE N/A 10/27/2018    Performed by Eliott Nine, MD at [facility] ENDO   ? ESOPHAGOGASTRODUODENOSCOPY WITH BIOPSY - FLEXIBLE N/A 12/16/2018    Performed by Buckles, Vinnie Level, MD at [facility] ENDO   ? ESOPHAGOGASTRODUODENOSCOPY WITH CONTROL OF BLEEDING - FLEXIBLE N/A 12/16/2018    Performed by Buckles, Vinnie Level, MD at [facility] ENDO ? ESOPHAGOGASTRODUODENOSCOPY WITH SPECIMEN COLLECTION BY BRUSHING/ WASHING N/A 01/22/2019    Performed by Veneta Penton, MD at [facility] ENDO   ? ESOPHAGOGASTRODUODENOSCOPY WITH DILATION ESOPHAGUS WITH BALLOON 30 MM OR GREATER - FLEXIBLE N/A 01/22/2019    Performed by Veneta Penton, MD at [facility] ENDO   ? ESOPHAGOGASTRODUODENOSCOPY WITH BIOPSY - FLEXIBLE N/A 01/22/2019    Performed by Veneta Penton, MD at [facility] ENDO   ? ESOPHAGOGASTRODUODENOSCOPY WITH BIOPSY - FLEXIBLE N/A 06/19/2019    Performed by Dawna Part, MD at [facility] ENDO   ? ESOPHAGOGASTRODUODENOSCOPY [WITH APC]  WITH SPECIMEN COLLECTION BY BRUSHING/ WASHING N/A 08/07/2019    Performed by Dawna Part, MD at [facility] ENDO   ? ESOPHAGOGASTRODUODENOSCOPY WITH SPECIMEN COLLECTION BY BRUSHING/ WASHING N/A 09/10/2019    Performed by Buckles, Vinnie Level, MD at [facility] ENDO   ? ESOPHAGOGASTRODUODENOSCOPY WITH CONTROL OF BLEEDING - FLEXIBLE N/A 09/10/2019    Performed by Buckles, Vinnie Level, MD at [facility] ENDO   ? CHOLECYSTECTOMY     ? COLONOSCOPY     ? ESOPHAGOGASTRIC FUNDOPLICATION  2003, 2004    laparoscopic   ? ESOPHAGOGASTRIC FUNDOPLICATION     ? HX HYSTERECTOMY     ? PR ESOPHAGOSCOPY FLEXIBLE TRANSORAL DIAGNOSTIC        Medications Prior to Admission   Medication Sig Dispense Refill Last Dose   ? atorvastatin (LIPITOR) 10 mg tablet TAKE 1 TABLET BY MOUTH EVERY DAY 90 tablet 3    ? cetirizine (ZYRTEC) 10 mg tablet Take 10 mg by mouth daily as needed for Allergy symptoms.      ? duloxetine DR (CYMBALTA) 60 mg capsule Take 60 mg by mouth daily.      ? furosemide (LASIX) 20 mg tablet Take three tablets by mouth every morning. 90 tablet 3    ? lactulose 10 gram/15 mL oral solution Take 30 mL by mouth as Needed. 236 mL 1    ? miconazole (MONISTAT 3) 200 mg/5 gram (4 %) vaginal cream Insert or Apply  to vaginal area at bedtime as needed.      ? pantoprazole DR (PROTONIX) 40 mg tablet TAKE 1 TABLET BY MOUTH TWICE A DAY 180 tablet 1 ? polyethylene glycol 3350 (MIRALAX) 17 g packet Take one packet by mouth twice daily. (Patient taking differently: Take 17 g by mouth twice daily as needed (uses 2-3x/day PRN).)      ? promethazine (PHENERGAN) 25 mg tablet Take 25 mg by mouth every 4 hours as needed. Just uses it with Tramadol      ? rifAXIMin (XIFAXAN) 550 mg tablet Take one tablet by mouth twice daily. 60 tablet 1    ? spironolactone (ALDACTONE) 50 mg tablet Take  two tablets by mouth daily. Take with food.  Indications: ascites, accumulation of fluid caused by cirrhosis of the liver 180 tablet 1    ? zinc sulfate 220 mg (50 mg elemental zinc) capsule Take one capsule by mouth daily. 90 capsule 3      Allergies   Allergen Reactions   ? Adhesive Tape (Rosins) RASH   ? Contrast Dye Iv, Iodine Containing [Iodinated Contrast Media] RASH   ? Sulfa (Sulfonamide Antibiotics) RASH   ? Codeine NAUSEA AND VOMITING   ? Morphine SEE COMMENTS     Pt reports burns her veins   ? Penicillin G SEE COMMENTS     Throat issues, blisters in throat    ? Tramadol NAUSEA ONLY and ITCHING     Takes this medication regularly        Social History:   Social History     Tobacco Use   ? Smoking status: Never Smoker   ? Smokeless tobacco: Never Used   Substance Use Topics   ? Alcohol use: No      Family History   Problem Relation Age of Onset   ? Other Mother    ? Kidney Failure Mother    ? Osteoporosis Mother    ? Cancer Father         esophageal   ? Liver Disease Sister         endstage, s/p liver transplant   ? Cancer Brother         tonsil, liver    ? Hip Fracture Neg Hx         Review of Systems  Constitutional: negative for fevers  Respiratory: positive for dyspnea on exertion, negative for increased work of breathing  Cardiovascular: negative for chest pain  Gastrointestinal: negative for nausea and vomiting    Previous Anesthetic/Sedation History:  Reviewed    Code Status: Full Code    Physical Exam:  Vital Signs: Last Filed In 24 Hours Vital Signs: 24 Hour Range General appearance: alert and no distress  Neurologic: Grossly normal, at baseline  Lungs: Nonlabored with normal effort  Abdomen: distended  Extremities: extremities normal, atraumatic, no cyanosis or edema    Sedation/Medication Plan: Lidocaine  Personal history of sedation complications: Denies adverse event.   Family history of sedation complications: Denies adverse event.   Medications for Reversal: None  Discussion/Reviews:  Physician has discussed risks and alternatives of this type of sedation and above planned procedures with patient  NPO Status: Acceptable  Pregnancy Status: Hx of hysterectomy        Lab/Radiology/Other Diagnostic Tests:  Labs:  Pertinent labs reviewed           Samson Frederic, APRN-NP  Pager [phone]

## 2019-10-13 NOTE — Other
Immediate Post Procedure Note    Date:  10/13/2019                                         Attending Physician:   Theda Sers  Performing Provider:  Rhunette Croft, MD    Consent:  Consent obtained from patient.  Time out performed: Consent obtained, correct patient verified, correct procedure verified, correct site verified, patient marked as necessary.  Pre/Post Procedure Diagnosis:  Ascites  Indications:  Ascites    Anesthesia: Local lidocaine  Procedure(s):  Paracentesis  Findings:  Straw colored fluid     Estimated Blood Loss:  None/Negligible  Specimen(s) Removed/Disposition:  Yes, sent to pathology  Complications: None  Patient Tolerated Procedure: Well  Post-Procedure Condition:  stable    Rhunette Croft, MD  Pager [phone]

## 2019-10-26 ENCOUNTER — Ambulatory Visit: Admit: 2019-10-26 | Discharge: 2019-10-27 | Payer: MEDICARE

## 2019-10-26 ENCOUNTER — Encounter: Admit: 2019-10-26 | Discharge: 2019-10-26 | Payer: MEDICARE

## 2019-10-26 DIAGNOSIS — K760 Fatty (change of) liver, not elsewhere classified: Secondary | ICD-10-CM

## 2019-10-26 DIAGNOSIS — J329 Chronic sinusitis, unspecified: Secondary | ICD-10-CM

## 2019-10-26 DIAGNOSIS — E119 Type 2 diabetes mellitus without complications: Secondary | ICD-10-CM

## 2019-10-26 DIAGNOSIS — I729 Aneurysm of unspecified site: Secondary | ICD-10-CM

## 2019-10-26 DIAGNOSIS — K5730 Diverticulosis of large intestine without perforation or abscess without bleeding: Secondary | ICD-10-CM

## 2019-10-26 DIAGNOSIS — I10 Essential (primary) hypertension: Secondary | ICD-10-CM

## 2019-10-26 DIAGNOSIS — Z01810 Encounter for preprocedural cardiovascular examination: Secondary | ICD-10-CM

## 2019-10-26 DIAGNOSIS — R0600 Dyspnea, unspecified: Secondary | ICD-10-CM

## 2019-10-26 DIAGNOSIS — K7460 Unspecified cirrhosis of liver: Secondary | ICD-10-CM

## 2019-10-26 DIAGNOSIS — E669 Obesity, unspecified: Secondary | ICD-10-CM

## 2019-10-26 MED ORDER — IRON DEXTRAN IVPB
975 mg | Freq: Once | INTRAVENOUS | 0 refills | Status: CP
Start: 2019-10-26 — End: ?
  Administered 2019-10-27 (×2): 975 mg via INTRAVENOUS

## 2019-10-26 MED ORDER — IRON DEXTRAN IVPB
975 mg | Freq: Once | INTRAVENOUS | 0 refills | Status: CN
Start: 2019-10-26 — End: ?

## 2019-10-26 MED ORDER — DIPHENHYDRAMINE HCL 25 MG PO CAP
25 mg | Freq: Once | ORAL | 0 refills | Status: CN
Start: 2019-10-26 — End: ?

## 2019-10-26 MED ORDER — ACETAMINOPHEN 500 MG PO TAB
500 mg | Freq: Once | ORAL | 0 refills | Status: CN
Start: 2019-10-26 — End: ?

## 2019-10-26 MED ORDER — IRON DEXTRAN IVPB
25 mg | Freq: Once | INTRAVENOUS | 0 refills | Status: CP
Start: 2019-10-26 — End: ?
  Administered 2019-10-27: 17:00:00 25 mg via INTRAVENOUS

## 2019-10-26 MED ORDER — IRON DEXTRAN IVPB
25 mg | Freq: Once | INTRAVENOUS | 0 refills | Status: CN
Start: 2019-10-26 — End: ?

## 2019-10-27 ENCOUNTER — Ambulatory Visit: Admit: 2019-10-27 | Discharge: 2019-10-28 | Payer: MEDICARE

## 2019-10-27 ENCOUNTER — Encounter: Admit: 2019-10-27 | Discharge: 2019-10-27 | Payer: MEDICARE

## 2019-10-27 DIAGNOSIS — I729 Aneurysm of unspecified site: Secondary | ICD-10-CM

## 2019-10-27 DIAGNOSIS — K31819 Angiodysplasia of stomach and duodenum without bleeding: Secondary | ICD-10-CM

## 2019-10-27 DIAGNOSIS — J329 Chronic sinusitis, unspecified: Secondary | ICD-10-CM

## 2019-10-27 DIAGNOSIS — K760 Fatty (change of) liver, not elsewhere classified: Secondary | ICD-10-CM

## 2019-10-27 DIAGNOSIS — E669 Obesity, unspecified: Secondary | ICD-10-CM

## 2019-10-27 DIAGNOSIS — M818 Other osteoporosis without current pathological fracture: Secondary | ICD-10-CM

## 2019-10-27 DIAGNOSIS — Z01810 Encounter for preprocedural cardiovascular examination: Secondary | ICD-10-CM

## 2019-10-27 DIAGNOSIS — E119 Type 2 diabetes mellitus without complications: Secondary | ICD-10-CM

## 2019-10-27 DIAGNOSIS — R0600 Dyspnea, unspecified: Secondary | ICD-10-CM

## 2019-10-27 DIAGNOSIS — D500 Iron deficiency anemia secondary to blood loss (chronic): Secondary | ICD-10-CM

## 2019-10-27 DIAGNOSIS — I10 Essential (primary) hypertension: Secondary | ICD-10-CM

## 2019-10-27 DIAGNOSIS — K7460 Unspecified cirrhosis of liver: Secondary | ICD-10-CM

## 2019-10-27 DIAGNOSIS — D649 Anemia, unspecified: Secondary | ICD-10-CM

## 2019-10-27 DIAGNOSIS — K5730 Diverticulosis of large intestine without perforation or abscess without bleeding: Secondary | ICD-10-CM

## 2019-10-27 MED ORDER — DIPHENHYDRAMINE HCL 25 MG PO CAP
25 mg | Freq: Once | ORAL | 0 refills | Status: CP
Start: 2019-10-27 — End: ?
  Administered 2019-10-27: 18:00:00 25 mg via ORAL

## 2019-10-27 MED ORDER — ACETAMINOPHEN 500 MG PO TAB
500 mg | Freq: Once | ORAL | 0 refills | Status: CP
Start: 2019-10-27 — End: ?
  Administered 2019-10-27: 18:00:00 500 mg via ORAL

## 2019-10-27 NOTE — Patient Instructions
Iron Dextran injection  Brand Name: INFeD  What is this medicine?  IRON DEXTRAN (AHY ern DEX tran) is an iron complex. Iron is used to make healthy red blood cells, which carry oxygen and nutrients through the body. This medicine is used to treat people who cannot take iron by mouth and have low levels of iron in the blood.  How should I use this medicine?  This medicine is for injection into a vein or a muscle. It is given by a health care professional in a hospital or clinic setting.  Talk to your pediatrician regarding the use of this medicine in children. While this drug may be prescribed for children as young as 4 months old for selected conditions, precautions do apply.  What side effects may I notice from receiving this medicine?  Side effects that you should report to your doctor or health care professional as soon as possible:  · allergic reactions like skin rash, itching or hives, swelling of the face, lips, or tongue  · blue lips, nails, or skin  · breathing problems  · changes in blood pressure  · chest pain  · confusion  · fast, irregular heartbeat  · feeling faint or lightheaded, falls  · fever or chills  · flushing, sweating, or hot feelings  · joint or muscle aches or pains  · pain, tingling, numbness in the hands or feet  · seizures  · unusually weak or tired  Side effects that usually do not require medical attention (report to your doctor or health care professional if they continue or are bothersome):  · change in taste (metallic taste)  · diarrhea  · headache  · irritation at site where injected  · nausea, vomiting  · stomach upset  What may interact with this medicine?  Do not take this medicine with any of the following medications:  · deferoxamine  · dimercaprol  · other iron products  This medicine may also interact with the following medications:  · chloramphenicol  · deferasirox  What if I miss a dose?  It is important not to miss your dose. Call your doctor or health care professional  if you are unable to keep an appointment.  Where should I keep my medicine?  This drug is given in a hospital or clinic and will not be stored at home.  What should I tell my health care provider before I take this medicine?  They need to know if you have any of these conditions:  · anemia not caused by low iron levels  · heart disease  · high levels of iron in the blood  · kidney disease  · liver disease  · an unusual or allergic reaction to iron, other medicines, foods, dyes, or preservatives  · pregnant or trying to get pregnant  · breast-feeding  What should I watch for while using this medicine?  Visit your doctor or health care professional regularly. Tell your doctor if your symptoms do not start to get better or if they get worse. You may need blood work done while you are taking this medicine.  You may need to follow a special diet. Talk to your doctor. Foods that contain iron include: whole grains/cereals, dried fruits, beans, or peas, leafy green vegetables, and organ meats (liver, kidney).  Long-term use of this medicine may increase your risk of some cancers. Talk to your doctor about how to limit your risk.  NOTE:This sheet is a summary. It may not cover all   possible information. If you have questions about this medicine, talk to your doctor, pharmacist, or health care provider. Copyright© 2020 [employer]

## 2019-10-28 NOTE — Patient Education
Dear Ms. Shaul,     Thank you for choosing [facility] Interventional Radiology for your procedure. Your appointment information is listed below:  Appointment Date: 10/30/19  Appointment Time: 12:45pm  Arrival Time: 11:45am  Location:     ? [place]: [address], [place], [place]  [address]  Parking: [address]3 Parking Garage    INTERVENTIONAL RADIOLOGY  PRE-PROCEDURE INSTRUCTIONS     You are scheduled for a procedure in Interventional Radiology.  Please follow these instructions and any direction from your Primary Care/Managing Physician.  If you have questions about your procedure or need to reschedule please call [phone].    Medication Instructions:   You may take the following medications with a small sip of water:  Continue your regular medications as directed.     Diet Instructions: No dietary restrictions   Day of Exam Instructions:  1. Bathe or shower with an antibacterial soap prior to your appointment.  2. Bring a list of your current medications and the dosages.  3. Wear comfortable clothing and leave valuables at home.  4. Arrive 1 hour prior to your appointment.  This time will be spent registering, interviewing, assessing, educating and preparing you for the test.  ? You will be with [place] anywhere from 30 minutes to 6 hours after your exam depending on your procedure.    Interventional Radiology Team  Perioperative and Procedural Scheduling Department  [facility]  Ph: [phone]    Kalman Shan BSN, RN,

## 2019-10-28 NOTE — Progress Notes
Interventional Radiology Outpatient Scheduling Checklist      1.  Name of Procedure(s):   Paracentesis      2.  Date of Procedure:   10/30/19      3.  Arrival Time:   0177      9.  Procedure Time:  3903      0.  Correct Procedural Room Assignment:  IR BH rm 8      6.  Blood Thinners Triaged and instructed per protocol: Y/N/NA:  NA  Confirmed accurate instructions sent to patient: Y/N:  NA       7.  Procedure Order Verified: Y/N:  Yes      9.  Patient instructed to have a driver: Y/N/NA:  Yes    10.  Patient instructed on NPO status: Y/N/NA:  No dietary restrictions  Confirmed accurate instructions sent to patient: Y/N:  Yes    11.  Specimen needed: Y/N/NA:  Yes   Verified Order placed: Y/N:  Yes    12.  Allergies Verified:  Y/N:  Yes    13.  Is there an Iodine Allergy: Y/N:  No  Does the Procedure Require contrast: Y/N:  No   If so, was the IR- Contrast Allergy Pre-Procedure Medication protocol ordered: Y/NA:  NA    14.  Does the patient have labs according to IR Pre-procedure Laboratory Parameter policy: Y/N/NA:  Yes  If No, was the patient instructed to obtain labs prior to procedure: Y/N/NA:  NA     15.  Will the patient need to be admitted or have a possible admission: Y/N:  No  If yes, confirmed accurate instructions sent to patient: Y/N/NA:  NA     16.  Patient States Understanding: Y/N:  Yes    17.  History of OSA:  Y/N:  No  If yes, confirm request to bring CPAP sent to patient: Y/N/NA:  NA    18. Patient declines electronic procedure instructions: Y/N:  No

## 2019-10-29 ENCOUNTER — Ambulatory Visit: Admit: 2019-10-29 | Discharge: 2019-10-30 | Payer: MEDICARE

## 2019-10-29 ENCOUNTER — Encounter: Admit: 2019-10-29 | Discharge: 2019-10-29 | Payer: MEDICARE

## 2019-10-29 DIAGNOSIS — K7460 Unspecified cirrhosis of liver: Secondary | ICD-10-CM

## 2019-10-29 DIAGNOSIS — K7469 Other cirrhosis of liver: Secondary | ICD-10-CM

## 2019-10-29 DIAGNOSIS — K31819 Angiodysplasia of stomach and duodenum without bleeding: Secondary | ICD-10-CM

## 2019-10-29 DIAGNOSIS — D500 Iron deficiency anemia secondary to blood loss (chronic): Secondary | ICD-10-CM

## 2019-10-29 DIAGNOSIS — D649 Anemia, unspecified: Secondary | ICD-10-CM

## 2019-10-29 DIAGNOSIS — R945 Abnormal results of liver function studies: Secondary | ICD-10-CM

## 2019-10-29 DIAGNOSIS — K760 Fatty (change of) liver, not elsewhere classified: Secondary | ICD-10-CM

## 2019-10-29 LAB — COMPREHENSIVE METABOLIC PANEL
Lab: 0.9 mg/dL (ref 0.2–1.2)
Lab: 1.2 mg/dL — ABNORMAL HIGH (ref 0.57–1.11)
Lab: 13 meq/L — ABNORMAL LOW (ref 0–14)
Lab: 137 mmol/L — ABNORMAL LOW (ref 136–145)
Lab: 188 mg/dL — ABNORMAL HIGH (ref 70–105)
Lab: 2.7 g/dL — ABNORMAL LOW (ref 3.4–4.8)
Lab: 207 U/L — ABNORMAL HIGH (ref 40–150)
Lab: 24 mmol/L — ABNORMAL LOW (ref 23–31)
Lab: 25 % (ref 0.0–2.0)
Lab: 28 — ABNORMAL HIGH (ref 9.8–20.1)
Lab: 47 x10-3/uL — AB (ref >59)
Lab: 8.1 mg/dL — ABNORMAL LOW (ref 8.4–10.2)
Lab: 0.9 mg/dL
Lab: 1.3 mg/dL — ABNORMAL HIGH (ref 0.57–1.11)
Lab: 119 — ABNORMAL HIGH (ref 70–105)
Lab: 12 meq/L — ABNORMAL HIGH (ref 11.5–14.5)
Lab: 193 U/L — ABNORMAL HIGH (ref 40–150)
Lab: 2.5 g/dL — ABNORMAL LOW (ref 3.4–4.8)
Lab: 21 U/L
Lab: 23 mmol/L — ABNORMAL LOW (ref 30.0–34.0)
Lab: 4.4 mmol/L — ABNORMAL LOW (ref 80.0–99.0)
Lab: 6.2 g/dL
Lab: 8.3 — ABNORMAL LOW (ref 8.4–10.2)

## 2019-10-29 LAB — PROTIME INR (PT)
Lab: 1.4 mmol/L — ABNORMAL LOW (ref 98–107)
Lab: 15 s — ABNORMAL HIGH (ref 9.9–12.6)
Lab: 15 s — ABNORMAL HIGH (ref 9.9–12.6)

## 2019-10-29 LAB — CBC AND DIFF
Lab: 0.1
Lab: 0.1
Lab: 2.7 — ABNORMAL LOW (ref 4.20–5.40)
Lab: 6.2
Lab: 0.2 10*3/uL (ref 0.0–0.6)
Lab: 0.9 uL (ref 0.9–5.1)
Lab: 2.9 — ABNORMAL HIGH (ref 5–34)
Lab: 1.6 U/L — ABNORMAL HIGH (ref 5–34)
Lab: 20 mmol/L — ABNORMAL LOW (ref 37.0–47.0)
Lab: 22 mmol/L — ABNORMAL LOW (ref 27.0–31.0)
Lab: 248 mg/dL — ABNORMAL HIGH (ref 9.8–20.1)
Lab: 6.2
Lab: 0.1 K/UL (ref 0–0.45)
Lab: 15 % — ABNORMAL LOW (ref 24–44)
Lab: 18 % — ABNORMAL LOW (ref 36–45)
Lab: 22 % — ABNORMAL HIGH (ref 11–15)
Lab: 23 pg — ABNORMAL LOW (ref 26–34)
Lab: 241 10*3/uL (ref 150–400)
Lab: 32 g/dL (ref 32.0–36.0)
Lab: 6.1 g/dL — ABNORMAL LOW (ref >60)
Lab: 72 % (ref 41–77)
Lab: 72 FL — ABNORMAL LOW (ref 80–100)
Lab: 8.7 K/UL — ABNORMAL LOW (ref 4.5–11.0)
Lab: 8.9 FL (ref 7–11)

## 2019-10-29 NOTE — Telephone Encounter
Patient called in wanted to know if she needs to get blood work as well while she is here tomorrow for the paracentesis.

## 2019-10-29 NOTE — Patient Instructions
HEART FAILURE INFUSION CLINIC  OUTPATIENT POST TRANSFUSION INSTRUCTIONS     During your transfusion you were monitored by nursing staff for signs of a transfusion reaction, however, you should continue to observe for the following symptoms after you have been dismissed from the clinic:     FEVER OF 100.5 OR HIGHER  CHILLS  NAUSEA OR VOMITING  FEELING FAINT OR DIZZY  SHORTNESS OF BREATH  DARK OR RED COLORED URINE  CHEST OR BACK PAIN  SHOCK OR LOSS OF CONSCIOUSNESS  YELLOWING OF THE EYES OR SKIN     If you or your caregiver notice any of these symptoms, contact your ordering physician immediately and go to the nearest Emergency Department for treatment. When you arrive at the Emergency Department notify them that you recently received a blood transfusion. *For more detailed signs and symptoms of a transfusion reaction, please refer to the [place] education information below.          When You Need a Blood Transfusion (Adult)  A blood transfusion may be done when you have lost blood because of an injury or during surgery. It can also be done because of diseases or conditions that affect the blood. Blood is made up of several different parts (blood products). You may receive some or all of these blood products during a transfusion. Blood for transfusion is usually donated from another person (donor). Strict measures are taken to make sure that donated blood is safe before it's given to you. This sheet helps you understand how a blood transfusion is done. Your healthcare provider will discuss your condition with you and answer your questions.   The parts of blood  Blood can be broken down into different parts that perform special roles in the body. These parts include:  ? Red blood cells, which carry oxygen throughout the body.  ? Platelets, which help stop bleeding.  ? Plasma (the liquid part of blood), which carries red blood cells and platelets throughout the body. Plasma also helps platelets in stopping bleeding. Where does donated blood come from?  ? Volunteer donors. These are people who donate their blood to help others in need of blood. Blood donation can take place at several places, including a hospital, blood bank, or during a blood drive.  ? Directed donation. If you need a blood transfusion during a planned surgery, family and friends can have their blood tested for compatibility and donate blood for you before the surgery. This needs to be done at least 7 day(s) in advance. This is because the blood must be tested for safety.  ? Autologous donation. This is also called self-donation. For planned surgery, you can donate your own blood starting up to 6 weeks before surgery.  Are blood transfusions safe?  Donated blood is tested and processed to make sure that the blood is safe:  ? The health and medical history of each donor is carefully screened. If a person is considered high-risk for infection or problems, he or she isn't accepted as a blood donor.  ? Donated blood is tested for infections such as hepatitis, syphilis, and HIV (the virus that causes AIDS). If the tested blood is found to be unsafe, it's destroyed.  ? Blood is divided into four types: A, B, AB, and O. Blood also has Rh types: positive (+) and negative (-). You can only receive blood products that are compatible with (match) your blood type. A sample of your blood is tested for compatibility with donated blood. This is  done before blood products are prepared for a transfusion.  How is a blood transfusion done?  A blood transfusion takes place in a blood center, infusion center, hospital room, or operating room. Your healthcare provider will discuss the blood transfusion with you before it's done. You'll need to give permission for the blood transfusion by signing a consent form.  ? Two healthcare providers confirm your identity. They also confirm that they have the correct blood product(s) for you. ? An intravenous (IV) line is placed in a vein if you do not already have an IV.  ? The blood product comes in a plastic bag that is hung on an IV pole. The blood product flows from the bag into your IV line. The IV line may be connected to a pump, which controls the transfusion rate. You may receive more than one kind of blood product through the IV.  ? Your vital signs (blood pressure, heart rate, respiratory rate, and temperature) are checked throughout the transfusion. This is to make sure you are not having a reaction to the blood product.  ? The IV line may be removed once the transfusion is complete.  Possible risks and complications of blood transfusions  Most transfusions are problem free. In some cases, reactions occur. These can happen within seconds to minutes during the transfusion or a week to a few months after the transfusion. Call your doctor or nurse right away if you have any of the signs or symptoms in the table below during or after a transfusion:  Reaction Timing Symptoms   Allergic reaction (mild) ? Within seconds to minutes during the transfusion  ? Up to 24 hours after the transfusion Hives or red welts on the skin, mild itching, rash, localized swelling, flushing (red face), wheezing, shortness of breath, or stridor (high-pitched noise or sound)   Anaphylactic reaction ? Within seconds to minutes during the transfusion  ? Up to 24 hours after the transfusion Shortness of breath, flushing (red face), wheezing, labored (working hard) breathing, low blood pressure, localized swelling, chest tightness, or cramps   Febrile nonhemolytic reaction ? Within minutes to hours during the transfusion  ? Within a few hours to 24 hours after the transfusion Fever (increase of 1? C or higher), chills, flushing (red face), nausea, headache, minor discomfort, or mild shortness of breath   Acute immune hemolytic reaction ? Within minutes during the transfusion ? Up to 24 hours after the transfusion Fever, red or brown urine, back pain, fast heart rate (tachycardia), abdominal pain, low blood pressure, feeling anxious, chills, chest pain, nausea, or fainting spells   Transfusion-related acute lung injury (TRALI) ? Within 1 to 2 hours during the transfusion  ? Up to 6 hours after the transfusion Shortness of breath, trouble breathing, low blood pressure, fever, pulmonary edema   Transfusion-associated circulatory overload ? Near the end of the transfusion  ? Within 6 hours after the transfusion Shortness of breath, fast heart rate (tachycardia), problems breathing when lying on back, abnormal blood pressure   Post-transfusion purpura (PUP) ? Within 1 week  ? Up to 48 days after the transfusion Purple spots on skin; nose bleed; bleeding from the urinary tract, abdomen, colon, or rectum; fever; or chills     Delayed transfusion-related acute lung injury (TRALI) ? Within 72 hours (3 days) after the transfusion Sudden onset of respiratory distress or trouble breathing   Delayed hemolytic reaction ? Within 3 to 7 days  ? Up to weeks after the transfusion Low-grade  fever, mild jaundice (yellowing of the skin and whites of the eyes), decrease in hematocrit, chills, chest pain, back pain, nausea   ? 2000-2019 [employer], [place]. [address], [place], [place] [address]. All rights reserved. This information is not intended as a substitute for professional medical care. Always follow your healthcare professional's instructions.

## 2019-10-30 ENCOUNTER — Encounter: Admit: 2019-10-30 | Discharge: 2019-10-30 | Payer: MEDICARE

## 2019-10-30 ENCOUNTER — Ambulatory Visit: Admit: 2019-10-30 | Discharge: 2019-10-30 | Payer: MEDICARE

## 2019-10-30 DIAGNOSIS — D500 Iron deficiency anemia secondary to blood loss (chronic): Secondary | ICD-10-CM

## 2019-10-30 DIAGNOSIS — K760 Fatty (change of) liver, not elsewhere classified: Secondary | ICD-10-CM

## 2019-10-30 DIAGNOSIS — K31819 Angiodysplasia of stomach and duodenum without bleeding: Secondary | ICD-10-CM

## 2019-10-30 DIAGNOSIS — K5730 Diverticulosis of large intestine without perforation or abscess without bleeding: Secondary | ICD-10-CM

## 2019-10-30 DIAGNOSIS — E119 Type 2 diabetes mellitus without complications: Secondary | ICD-10-CM

## 2019-10-30 DIAGNOSIS — R0600 Dyspnea, unspecified: Secondary | ICD-10-CM

## 2019-10-30 DIAGNOSIS — J329 Chronic sinusitis, unspecified: Secondary | ICD-10-CM

## 2019-10-30 DIAGNOSIS — I10 Essential (primary) hypertension: Secondary | ICD-10-CM

## 2019-10-30 DIAGNOSIS — K7469 Other cirrhosis of liver: Secondary | ICD-10-CM

## 2019-10-30 DIAGNOSIS — I729 Aneurysm of unspecified site: Secondary | ICD-10-CM

## 2019-10-30 DIAGNOSIS — K7460 Unspecified cirrhosis of liver: Secondary | ICD-10-CM

## 2019-10-30 DIAGNOSIS — E669 Obesity, unspecified: Secondary | ICD-10-CM

## 2019-10-30 DIAGNOSIS — Z01810 Encounter for preprocedural cardiovascular examination: Secondary | ICD-10-CM

## 2019-10-30 LAB — GRAM STAIN

## 2019-10-30 MED ORDER — ALBUMIN, HUMAN 25 % IV SOLP
0 refills | Status: CP
Start: 2019-10-30 — End: ?
  Administered 2019-10-30: 19:00:00 37.5 g via INTRAVENOUS

## 2019-10-30 MED ORDER — RIFAXIMIN 550 MG PO TAB
550 mg | ORAL_TABLET | Freq: Two times a day (BID) | ORAL | 1 refills | 30.00000 days | Status: DC
Start: 2019-10-30 — End: 2019-12-11

## 2019-10-30 NOTE — Progress Notes
@   1158:  Pt in [place].  Pt is A&Ox4 and VSS on room air.  Pt report epigastric pain.  Pt denies any nausea.  Pt's PIV is patent.

## 2019-10-30 NOTE — Progress Notes
D/c instructions reviewed w/ pt.  S/s of infection reviewed.  Pt's PIV removed.  Pt taken to the lobby in a WC by unit staff.

## 2019-10-30 NOTE — Patient Instructions
INTERVENTIONAL RADIOLOGY DISCHARGE INSTRUCTIONS  PARACENTESIS  ?A paracentesis is the removal of an abnormal buildup of fluid in your abdominal cavity.? This fluid buildup is called ascites and may be caused by conditions such as liver disease, heart failure, or cancer.During this procedure, a needle is inserted into your abdomen to drain the fluid.? The fluid may then be sent to the lab for testing if medically indicated.? Removal of the fluid may also relieve belly pressure and shortness of breath caused by the ascites.?  POST-PROCEDURE ACTIVITY:  ? A responsible adult must drive you home.? ?If you receive sedation, you should not drive or operate heavy machinery or do anything that requires concentration for at least 24 hours after the procedure.  ? It is recommended that a responsible adult be with you until morning.  ? Rest today; you may resume normal activity tomorrow.?  POST-PROCEDURE SITE CARE:  ? Keep the bandage dry.? You may remove it after 24 hours.?  ? A dry gauze bandage may be reapplied as necessary to protect your clothing as the site may sometimes leak for several days after the procedure.?  ? You may shower in 24 hours, after the bandage is removed.?  ? Do not submerge the site underwater for 1 week or until fully healed (no tub bath, swimming/hot tub, etc.)?  ? Be sure your hands are clean when touching near the site.  ? Do not use ointments, creams or powders on the puncture site.?  DIET/MEDICATIONS:  ? You may resume your previous diet after the procedure.  ? If you receive sedation or narcotic pain medications, avoid any foods or beverages containing alcohol for at least 24 hours after the procedure.  ? Please see the Medication Reconciliation sheet for instructions on resuming your home medications.?  CALL THE DOCTOR IF:  ? Bright red blood has soaked the bandage.  ? You have severe abdominal pain unrelieved by pain medications.? Some soreness is to be expected. ? You have blood in your urine.  ? You have signs of infection:  ? Chills, fever greater than 101F.  ? Increased redness, warmth or swelling at the puncture site.  ? Pus draining from the puncture site.?  ?  For any of the above symptoms or for problems or concerns related to the procedure,? call? [phone] for Monday-Friday 7-5.? After-hours and weekends, please call? [phone] and ask for the Interventional Radiology Resident on-call.  ?

## 2019-10-30 NOTE — Procedures
Immediate Post Procedure Note    Date:  10/30/2019                                      Attending Physician:   Theda Sers  Assistant(s):  None      Procedure(s):  Paracentesis    Preprocedure/postprocedure diagnosis:  Abdominal Ascites  Description of procedure and procedural findings: Clear yellow ascites aspirated from the abdomen.  Total volumes pending, please refer to separate dictation.   Anesthesia: Local 10 mL 1% lidocaine without epinephrine  Sedation/Medication Plan: Other    Time out performed: Consent obtained, correct patient verified, correct procedure verified, correct site verified, patient marked as necessary.  Estimated Blood Loss:  None/Negligible  Specimen(s) Removed/Disposition:  None  Complications: None      Lenna Gilford, MD

## 2019-10-30 NOTE — H&P (View-Only)
IR Pre-Procedure History and Physical/Sedation Plan    Procedure Date: 10/30/2019     Planned Procedure(s):  Ultrasound guided paracentesis       Indication:  Ascites  __________________________________________________________________    Chief Complaint:  Ascites, ESLD    History of Present Illness: Kristin Moody is a 63 y.o. female with a history as listed below who presents today for procedure.She denies nausea/abdominal pain, complains of abdominal distension.    Patient Active Problem List    Diagnosis Date Noted   ? Anemia 09/28/2019   ? Encephalopathy 08/12/2019   ? Spasticity 08/10/2019   ? Hyperreflexia 08/10/2019   ? 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 Right leg weakness 05/14/2019   ? 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 Iron (Fe) deficiency anemia 01/19/2019   ? Preop cardiovascular exam 01/07/2019   ? Pre-transplant evaluation for liver transplant 01/06/2019   ? Cirrhosis (HCC) 12/16/2018   ? Acute on chronic anemia 12/16/2018   ? Lactic acidosis 12/16/2018   ? GI bleed 10/24/2018   ? Portal hypertension (HCC) 08/28/2018   ? Confusion 08/27/2018   ? Dyspnea 08/27/2018   ? Chronic abdominal pain 08/27/2018   ? 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 Acute on chronic blood loss anemia 08/27/2018   ? Iron deficiency anemia 04/13/2018   ? Pneumonia due to infectious organism 04/13/2018   ? Melena 04/10/2018   ? Esophageal varices without bleeding (HCC) 02/25/2018   ? Cirrhosis of liver without ascites (HCC) 02/25/2018   ? 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 Lower abdominal pain 10/24/2014   ? Chest discomfort 09/21/2014   ? Essential hypertension 08/13/2012   ? Abnormal liver function tests 05/13/2011   ? Fatty liver disease, nonalcoholic 05/13/2011   ? Obesity (BMI 30-39.9) 05/13/2011   ? Slow transit constipation 05/13/2011     Medical History:   Diagnosis Date   ? Aneurysm (HCC)    ? 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 Diverticulosis of colon     descending and sigmoid colon   ? Dyspnea    ? Essential hypertension 08/13/2012   ? Fatty infiltration of liver    ? HTN (hypertension)    ? Obesity (BMI 30-39.9) 05/13/2011   ? Preop cardiovascular exam 01/07/2019   ? Sinus infection    ? Type 2 diabetes mellitus (HCC) 09/21/2014      Surgical History:   Procedure Laterality Date   ? HYSTERECTOMY  1994   ? UPPER GASTROINTESTINAL ENDOSCOPY  2009   ? COLONOSCOPY  2009   ? CYSTOCELE REPAIR  2009    with endocele repair   ? RECTOCELE REPAIR  03/2009   ? LIVER BIOPSY  07/17/2010   ? COLONOSCOPY N/A 11/08/2017    Performed by Dawna Part, MD at [facility] ENDO   ? ESOPHAGOGASTRODUODENOSCOPY N/A 11/08/2017    Performed by Dawna Part, MD at [facility] ENDO   ? ESOPHAGOGASTRODUODENOSCOPY WITH BIOPSY - FLEXIBLE  11/08/2017    Performed by Dawna Part, MD at [facility] ENDO   ? COLONOSCOPY WITH HOT BIOPSY FORCEPS REMOVAL TUMOR/ POLYP/ OTHER LESION  11/08/2017    Performed by Dawna Part, MD at [facility] ENDO   ? ESOPHAGOGASTRODUODENOSCOPY WITH BAND LIGATION ESOPHAGEAL/ GASTRIC VARICES - FLEXIBLE N/A 04/10/2018    Performed by Normajean Baxter, MD at [facility] ENDO   ? ESOPHAGOGASTRODUODENOSCOPY WITH BIOPSY - FLEXIBLE N/A 04/10/2018  Performed by Normajean Baxter, MD at [facility] ENDO   ? ESOPHAGOGASTRODUODENOSCOPY WITH CONTROL OF BLEEDING - FLEXIBLE N/A 04/25/2018    Performed by Jolee Ewing, MD at [facility] ENDO   ? ESOPHAGOGASTRODUODENOSCOPY WITH BIOPSY - FLEXIBLE with push enteroscopy N/A 08/28/2018    Performed by Celesta Gentile, MD at [facility] ENDO   ? EGD N/A 10/27/2018    Performed by Eliott Nine, MD at [facility] ENDO   ? ESOPHAGOGASTRODUODENOSCOPY WITH SNARE REMOVAL TUMOR/ POLYP/ OTHER LESION - FLEXIBLE N/A 10/27/2018    Performed by Eliott Nine, MD at [facility] ENDO   ? ESOPHAGOGASTRODUODENOSCOPY WITH BIOPSY - FLEXIBLE N/A 12/16/2018    Performed by Buckles, Vinnie Level, MD at [facility] ENDO ? ESOPHAGOGASTRODUODENOSCOPY WITH CONTROL OF BLEEDING - FLEXIBLE N/A 12/16/2018    Performed by Buckles, Vinnie Level, MD at [facility] ENDO   ? ESOPHAGOGASTRODUODENOSCOPY WITH SPECIMEN COLLECTION BY BRUSHING/ WASHING N/A 01/22/2019    Performed by Veneta Penton, MD at [facility] ENDO   ? ESOPHAGOGASTRODUODENOSCOPY WITH DILATION ESOPHAGUS WITH BALLOON 30 MM OR GREATER - FLEXIBLE N/A 01/22/2019    Performed by Veneta Penton, MD at [facility] ENDO   ? ESOPHAGOGASTRODUODENOSCOPY WITH BIOPSY - FLEXIBLE N/A 01/22/2019    Performed by Veneta Penton, MD at [facility] ENDO   ? ESOPHAGOGASTRODUODENOSCOPY WITH BIOPSY - FLEXIBLE N/A 06/19/2019    Performed by Dawna Part, MD at [facility] ENDO   ? ESOPHAGOGASTRODUODENOSCOPY [WITH APC]  WITH SPECIMEN COLLECTION BY BRUSHING/ WASHING N/A 08/07/2019    Performed by Dawna Part, MD at [facility] ENDO   ? ESOPHAGOGASTRODUODENOSCOPY WITH SPECIMEN COLLECTION BY BRUSHING/ WASHING N/A 09/10/2019    Performed by Buckles, Vinnie Level, MD at [facility] ENDO   ? ESOPHAGOGASTRODUODENOSCOPY WITH CONTROL OF BLEEDING - FLEXIBLE N/A 09/10/2019    Performed by Buckles, Vinnie Level, MD at [facility] ENDO   ? CHOLECYSTECTOMY     ? COLONOSCOPY     ? ESOPHAGOGASTRIC FUNDOPLICATION  2003, 2004    laparoscopic   ? ESOPHAGOGASTRIC FUNDOPLICATION     ? HX HYSTERECTOMY     ? PR ESOPHAGOSCOPY FLEXIBLE TRANSORAL DIAGNOSTIC        Medications Prior to Admission   Medication Sig Dispense Refill Last Dose   ? atorvastatin (LIPITOR) 10 mg tablet TAKE 1 TABLET BY MOUTH EVERY DAY 90 tablet 3    ? cetirizine (ZYRTEC) 10 mg tablet Take 10 mg by mouth daily as needed for Allergy symptoms.      ? duloxetine DR (CYMBALTA) 60 mg capsule Take 60 mg by mouth daily.      ? furosemide (LASIX) 20 mg tablet Take three tablets by mouth every morning. 90 tablet 3    ? lactulose 10 gram/15 mL oral solution Take 30 mL by mouth as Needed. 236 mL 1    ? miconazole (MONISTAT 3) 200 mg/5 gram (4 %) vaginal cream Insert or Apply  to vaginal area at bedtime as needed. ? pantoprazole DR (PROTONIX) 40 mg tablet TAKE 1 TABLET BY MOUTH TWICE A DAY 180 tablet 1    ? polyethylene glycol 3350 (MIRALAX) 17 g packet Take one packet by mouth twice daily. (Patient taking differently: Take 17 g by mouth twice daily as needed (uses 2-3x/day PRN).)      ? promethazine (PHENERGAN) 25 mg tablet Take 25 mg by mouth every 4 hours as needed. Just uses it with Tramadol      ? rifAXIMin (XIFAXAN) 550 mg tablet Take one tablet by mouth twice daily. 60 tablet 1    ?  spironolactone (ALDACTONE) 50 mg tablet Take two tablets by mouth daily. Take with food.  Indications: ascites, accumulation of fluid caused by cirrhosis of the liver 180 tablet 1    ? zinc sulfate 220 mg (50 mg elemental zinc) capsule Take one capsule by mouth daily. 90 capsule 3      Allergies   Allergen Reactions   ? Adhesive Tape (Rosins) RASH   ? Contrast Dye Iv, Iodine Containing [Iodinated Contrast Media] RASH   ? Sulfa (Sulfonamide Antibiotics) RASH   ? Codeine NAUSEA AND VOMITING   ? Morphine SEE COMMENTS     Pt reports burns her veins   ? Penicillin G SEE COMMENTS     Throat issues, blisters in throat    ? Tramadol NAUSEA ONLY and ITCHING     Takes this medication regularly        Social History:   Social History     Tobacco Use   ? Smoking status: Never Smoker   ? Smokeless tobacco: Never Used   Substance Use Topics   ? Alcohol use: No      Family History   Problem Relation Age of Onset   ? Other Mother    ? Kidney Failure Mother    ? Osteoporosis Mother    ? Cancer Father         esophageal   ? Liver Disease Sister         endstage, s/p liver transplant   ? Cancer Brother         tonsil, liver    ? Hip Fracture Neg Hx         Review of Systems  A comprehensive review of systems was negative.    Previous Personal Anesthetic/Sedation History:  Denies adverse events related to sedation/anesthesia.     Previous Family Anesthetic/Sedation History: Denies adverse events related to sedation/anesthesia.    Physical Exam: Vital Signs: Last Filed In 24 Hours Vital Signs: 24 Hour Range   BP: 108/45 (12/10 1934)  Temp: 37.1 ?C (98.8 ?F) (12/10 1934)  Pulse: 94 (12/10 1934)  Respirations: 16 PER MINUTE (12/10 1934)  SpO2: 97 % (12/10 1934) BP: (95-109)/(43-52)   Temp:  [36.9 ?C (98.4 ?F)-37.2 ?C (99 ?F)]   Pulse:  [89-95]   Respirations:  [16 PER MINUTE-18 PER MINUTE]   SpO2:  [95 %-100 %]           General appearance: alert and no distress noted.  Neurologic: Grossly normal.  Lungs: Non labored.  Heart: regular rate and rhythm  Abdominal distension present    Airway: airway assessment performed  Mallampati II (soft palate, uvula, fauces visible)  Head and Neck: no abnormalities noted  Mouth: no abnormalities noted  NPO status: Acceptable  Pregnancy Status: N/A  Anesthesia Classification:  ASA III (A patient with a severe systemic disease that limits activity, but is not incapacitating)  Pre-operative anxiolysis Plan: N/A  Sedation/Medication Plan: Lidocaine  Discussion/Reviews:  Physician has discussed risks and alternatives of this type of sedation and above planned procedures with patient    Lab/Radiology/Other Diagnostic Tests:  Labs:  Pertinent labs reviewed           Pollyann Kennedy, APRN-NP  Pager [phone]

## 2019-11-01 ENCOUNTER — Encounter: Admit: 2019-11-01 | Discharge: 2019-11-01 | Payer: MEDICARE

## 2019-11-01 DIAGNOSIS — E669 Obesity, unspecified: Secondary | ICD-10-CM

## 2019-11-01 DIAGNOSIS — I729 Aneurysm of unspecified site: Secondary | ICD-10-CM

## 2019-11-01 DIAGNOSIS — I10 Essential (primary) hypertension: Secondary | ICD-10-CM

## 2019-11-01 DIAGNOSIS — J329 Chronic sinusitis, unspecified: Secondary | ICD-10-CM

## 2019-11-01 DIAGNOSIS — K5730 Diverticulosis of large intestine without perforation or abscess without bleeding: Secondary | ICD-10-CM

## 2019-11-01 DIAGNOSIS — R0600 Dyspnea, unspecified: Secondary | ICD-10-CM

## 2019-11-01 DIAGNOSIS — K7460 Unspecified cirrhosis of liver: Secondary | ICD-10-CM

## 2019-11-01 DIAGNOSIS — E119 Type 2 diabetes mellitus without complications: Secondary | ICD-10-CM

## 2019-11-01 DIAGNOSIS — K760 Fatty (change of) liver, not elsewhere classified: Secondary | ICD-10-CM

## 2019-11-01 DIAGNOSIS — Z01810 Encounter for preprocedural cardiovascular examination: Secondary | ICD-10-CM

## 2019-11-02 ENCOUNTER — Encounter: Admit: 2019-11-02 | Discharge: 2019-11-02 | Payer: MEDICARE

## 2019-11-02 NOTE — Telephone Encounter
Outgoing call to patient. RE xifaxan patient assistance. Obtained number of people in household and annual household income for form.

## 2019-11-02 NOTE — Telephone Encounter
Fill of Xifaxan sent to [place] for patient's OOP costs. [employer] patient assistance application filled out and faxed with appeal letter, current medication list, insurance information, and RX fill report. RN awaiting response.

## 2019-11-06 ENCOUNTER — Encounter: Admit: 2019-11-06 | Discharge: 2019-11-06 | Payer: MEDICARE

## 2019-11-06 DIAGNOSIS — K760 Fatty (change of) liver, not elsewhere classified: Secondary | ICD-10-CM

## 2019-11-06 NOTE — Telephone Encounter
Call from [place] regarding lab results.  Please return  her call.

## 2019-11-06 NOTE — Telephone Encounter
call returned.  hgb 9.1 (labs from [place] in lab de). patient advised to get repeat labs on Tuesday 12/22 with iron studies.  team to fax iron studies to [facility]. MELD labs standing.  orders placed for iron studies

## 2019-11-08 ENCOUNTER — Encounter: Admit: 2019-11-08 | Discharge: 2019-11-08 | Payer: MEDICARE

## 2019-11-09 ENCOUNTER — Encounter: Admit: 2019-11-09 | Discharge: 2019-11-09 | Payer: MEDICARE

## 2019-11-09 DIAGNOSIS — R945 Abnormal results of liver function studies: Secondary | ICD-10-CM

## 2019-11-09 DIAGNOSIS — K31819 Angiodysplasia of stomach and duodenum without bleeding: Secondary | ICD-10-CM

## 2019-11-09 LAB — COMPREHENSIVE METABOLIC PANEL
Lab: 1.1 mg/dL
Lab: 1.2 mg/dL — ABNORMAL HIGH (ref 0.57–1.11)
Lab: 105 mmol/L
Lab: 11 meq/L
Lab: 126 mg/dL — ABNORMAL HIGH (ref 70–105)
Lab: 137 mmol/L — ABNORMAL LOW (ref 12.0–16.0)
Lab: 2.9 g/dL — ABNORMAL LOW (ref 3.4–4.8)
Lab: 207 U/L — ABNORMAL HIGH (ref 40–150)
Lab: 24 U/L
Lab: 25 mmol/L — ABNORMAL LOW (ref 27.0–31.0)
Lab: 26 mg/dL — ABNORMAL HIGH (ref 9.8–20.1)
Lab: 4.3 mmol/L — ABNORMAL LOW (ref 37.0–47.0)
Lab: 42 U/L — ABNORMAL HIGH (ref 5–34)
Lab: 47 — ABNORMAL LOW (ref >59)

## 2019-11-09 LAB — CBC AND DIFF
Lab: 0.1
Lab: 7.3
Lab: 0.7 g/dL
Lab: 9.1 mg/dL

## 2019-11-09 LAB — PROTIME INR (PT): Lab: 14 s — ABNORMAL HIGH (ref 9.9–12.6)

## 2019-11-17 ENCOUNTER — Encounter: Admit: 2019-11-17 | Discharge: 2019-11-17 | Payer: MEDICARE

## 2019-11-17 DIAGNOSIS — R945 Abnormal results of liver function studies: Secondary | ICD-10-CM

## 2019-11-17 DIAGNOSIS — K31819 Angiodysplasia of stomach and duodenum without bleeding: Secondary | ICD-10-CM

## 2019-11-17 LAB — COMPREHENSIVE METABOLIC PANEL
Lab: 1.1 mg/dL (ref 0.2–1.2)
Lab: 112 mg/dL — ABNORMAL HIGH (ref 70–105)
Lab: 12 meq/L — ABNORMAL LOW (ref 27.0–31.0)
Lab: 136 mmol/L — ABNORMAL LOW (ref 136–145)
Lab: 196 U/L — ABNORMAL HIGH (ref 40–150)
Lab: 2.8 g/dL — ABNORMAL LOW (ref 3.4–4.8)
Lab: 25 mmol/L
Lab: 34 mg/dL — ABNORMAL HIGH (ref 9.8–20.1)
Lab: 44 — ABNORMAL HIGH (ref 5–34)
Lab: 47 uL
Lab: 6.4 g/dL — ABNORMAL HIGH (ref 6.2–8.1)
Lab: 1.2 mg/dL — ABNORMAL HIGH (ref 0.57–1.11)
Lab: 1.4 mg/dL — ABNORMAL HIGH (ref 0.2–1.2)
Lab: 104 mmol/L (ref 98–107)
Lab: 116 mg/dL — ABNORMAL HIGH (ref 70–105)
Lab: 13 meq/L (ref 0–14)
Lab: 137 mmol/L — ABNORMAL LOW (ref 136–145)
Lab: 2.9 g/dL — ABNORMAL LOW (ref 3.4–4.8)
Lab: 25 mmol/L (ref 23–31)
Lab: 29 x10-3/uL (ref 1.9–8.1)
Lab: 32 mg/dL — ABNORMAL HIGH (ref 9.8–20.1)
Lab: 44
Lab: 6.4 g/dL (ref 0.1–0.9)
Lab: 8.5 mg/dL — ABNORMAL HIGH (ref 8.4–10.2)

## 2019-11-17 LAB — CBC AND DIFF
Lab: 6.2
Lab: 6.2
Lab: 0.1 10*3/uL (ref 0.0–2)
Lab: 0.2 x10-3/uL — AB (ref 0.0–0.6)
Lab: 1.8 % (ref 0.0–2.0)
Lab: 13 mg/dL — ABNORMAL LOW (ref 18.0–47.0)
Lab: 24 mg/dL — ABNORMAL HIGH (ref 11.5–14.5)
Lab: 0.1 10*3/uL (ref 0.0–0.2)
Lab: 1 x10-3/uL — ABNORMAL HIGH (ref 0.9–5.1)
Lab: 1.6 % — ABNORMAL HIGH (ref 0.0–2.0)
Lab: 28 % — ABNORMAL LOW (ref 37.0–47.0)

## 2019-11-17 LAB — PROTIME INR (PT)
Lab: 1.3 mmol/L — ABNORMAL LOW (ref 37.0–47.0)
Lab: 14 s — ABNORMAL HIGH (ref 9.9–12.6)
Lab: 14 s — ABNORMAL HIGH (ref 9.9–12.6)

## 2019-11-17 NOTE — Telephone Encounter
Returned call to pt confirming location.  Lab results requested.

## 2019-11-17 NOTE — Telephone Encounter
Patient called stated that she had blood work done last Tuesday and again today and wanted to make sure the results were received,

## 2019-11-18 ENCOUNTER — Encounter: Admit: 2019-11-18 | Discharge: 2019-11-18 | Payer: MEDICARE

## 2019-11-18 DIAGNOSIS — R188 Other ascites: Secondary | ICD-10-CM

## 2019-11-18 DIAGNOSIS — K7460 Unspecified cirrhosis of liver: Secondary | ICD-10-CM

## 2019-11-18 DIAGNOSIS — R601 Generalized edema: Secondary | ICD-10-CM

## 2019-11-18 MED ORDER — ZINC SULFATE 220 (50) MG PO CAP
220 mg | ORAL_CAPSULE | Freq: Every day | ORAL | 3 refills | Status: AC
Start: 2019-11-18 — End: ?

## 2019-11-18 MED ORDER — ATORVASTATIN 10 MG PO TAB
10 mg | ORAL_TABLET | Freq: Every day | ORAL | 3 refills | Status: DC
Start: 2019-11-18 — End: 2020-01-29

## 2019-11-18 MED ORDER — SPIRONOLACTONE 50 MG PO TAB
100 mg | ORAL_TABLET | Freq: Every day | ORAL | 1 refills | 46.00000 days | Status: DC
Start: 2019-11-18 — End: 2019-12-28

## 2019-11-18 MED ORDER — FUROSEMIDE 20 MG PO TAB
ORAL_TABLET | Freq: Every morning | ORAL | 1 refills | 90.00000 days | Status: DC
Start: 2019-11-18 — End: 2019-12-28

## 2019-11-18 NOTE — Telephone Encounter
Refills for spironolactone, furosemide and zinc sent to [facility] pharmacy.  Will follow up with pharmacy regarding price of Xifaxan and if it financial assistance is needed.

## 2019-11-18 NOTE — Telephone Encounter
Spoke with [employer] patient assistance program.  Renew application still pending but refill confirmed and will be sent to pt.  Pt notified.

## 2019-11-23 NOTE — Progress Notes
Interventional Radiology Outpatient Scheduling Checklist    1.Name of Procedure(s):Paracentesis    2.Date of Procedure:11/24/2019    3.Arrival Time:1115    4.Procedure Time:1215    5.Correct Procedural Room Assignment:IR [facility] Room #7 SONO    6.Blood Thinners Triaged and instructed per protocol: Y/N/NA: NA  Confirmed accurate instructions sent to patient: Y/N: NA     7.Procedure Order Verified: Y/N: Yes    9.Patient instructed to have a driver: Y/N/NA: Yes    10.Patient instructed on NPO status: Y/N/NA: NO Restrictions.  Confirmed accurate instructions sent to patient: Y/N: Yes    11.Specimen needed: Y/N/NA: Yes   Verified Order placed: Y/N: Yes    12.Allergies Verified:Y/N:Yes    13.Is there an Iodine Allergy: Y/N:No  Does the Procedure Require contrast: Y/N:no  If so, was the IR- Contrast Allergy Pre-Procedure Medication protocol ordered: Y/NA: NA    14.Does the patient have labs according to IR Pre-procedure Laboratory Parameter policy: Y/N/NA: Yes  If No, was the patient instructed to obtain labs prior to procedure: Y/N/NA: NA     15.Will the patient need to be admitted or have a possible admission: Y/N: No  If yes, confirmed accurate instructions sent to patient: Y/N/NA: NA     16.Patient States Understanding:Y/N: Yes    17.History of OSA:Y/N: No  If yes, confirm request to bring CPAP sent to patient: Y/N/NA: NA    18. Patient declines electronic procedure instructions: Y/N: No

## 2019-11-23 NOTE — Patient Education
Dear Ms Kristin Moody,    Thank you for choosing [facility] Interventional Radiology for your procedure. Your appointment information is listed below:    Appointment Date: 11/24/2019  Appointment Time: 12:15PM  Arrival Time: 11:15AM  Location:      [place]: [address], [place], [place] [address]  Parking: [address] Park[address]age      INTERVENTIONAL RADIOLOGY  PRE-PROCEDURE Ray are scheduled for a procedure in Interventional Radiology.  Please follow these instructions and any direction from your Primary Care/Managing Physician.  If you have questions about your procedure or need to reschedule please call [phone].      Medication Instructions:  Continue scheduled medication.       Diet Instructions: Maintain regular diet with no restrictions.     Day of Exam Instructions:  1. Bathe or shower with an antibacterial soap prior to your appointment.  2. Bring a list of your current medications and the dosages.  3. Wear comfortable clothing and leave valuables at home.  4. Arrive (1) hour prior to your appointment.  This time will be spent registering, interviewing, assessing, educating, and preparing you for the test.   You will be with [place] anywhere from 30 minutes to 2 hours after your exam depending on your procedure.  5. Depending on the procedure and as instructed by the nurse a responsible adult may be required to drive you home (no [place], taxis or buses are allowed). If a driver is required and you do not have one  we will be unable to perform your procedure.   6. The nurse will give you discharge instructions about your care and activities after the procedure.

## 2019-11-24 ENCOUNTER — Encounter: Admit: 2019-11-24 | Discharge: 2019-11-24 | Payer: MEDICARE

## 2019-11-24 ENCOUNTER — Ambulatory Visit: Admit: 2019-11-24 | Discharge: 2019-11-24 | Payer: MEDICARE

## 2019-11-24 DIAGNOSIS — K7469 Other cirrhosis of liver: Secondary | ICD-10-CM

## 2019-11-24 DIAGNOSIS — K31819 Angiodysplasia of stomach and duodenum without bleeding: Secondary | ICD-10-CM

## 2019-11-24 DIAGNOSIS — J329 Chronic sinusitis, unspecified: Secondary | ICD-10-CM

## 2019-11-24 DIAGNOSIS — K760 Fatty (change of) liver, not elsewhere classified: Secondary | ICD-10-CM

## 2019-11-24 DIAGNOSIS — Z01810 Encounter for preprocedural cardiovascular examination: Secondary | ICD-10-CM

## 2019-11-24 DIAGNOSIS — K7460 Unspecified cirrhosis of liver: Secondary | ICD-10-CM

## 2019-11-24 DIAGNOSIS — R0600 Dyspnea, unspecified: Secondary | ICD-10-CM

## 2019-11-24 DIAGNOSIS — D500 Iron deficiency anemia secondary to blood loss (chronic): Secondary | ICD-10-CM

## 2019-11-24 DIAGNOSIS — I729 Aneurysm of unspecified site: Secondary | ICD-10-CM

## 2019-11-24 DIAGNOSIS — E669 Obesity, unspecified: Secondary | ICD-10-CM

## 2019-11-24 DIAGNOSIS — I10 Essential (primary) hypertension: Secondary | ICD-10-CM

## 2019-11-24 DIAGNOSIS — E119 Type 2 diabetes mellitus without complications: Secondary | ICD-10-CM

## 2019-11-24 DIAGNOSIS — K5730 Diverticulosis of large intestine without perforation or abscess without bleeding: Secondary | ICD-10-CM

## 2019-11-24 LAB — COMPREHENSIVE METABOLIC PANEL
Lab: 1.1 mg/dL (ref 0.3–1.2)
Lab: 1.2 mg/dL — ABNORMAL HIGH (ref 0.4–1.00)
Lab: 105 MMOL/L (ref 98–110)
Lab: 115 mg/dL — ABNORMAL HIGH (ref 70–100)
Lab: 137 MMOL/L (ref 137–147)
Lab: 146 U/L — ABNORMAL HIGH (ref 25–110)
Lab: 2.8 g/dL — ABNORMAL LOW (ref 3.5–5.0)
Lab: 22 U/L (ref 7–56)
Lab: 24 MMOL/L (ref 21–30)
Lab: 30 mg/dL — ABNORMAL HIGH (ref 7–25)
Lab: 32 U/L (ref 7–40)
Lab: 4.1 MMOL/L — ABNORMAL LOW (ref 3.5–5.1)
Lab: 54 mL/min — ABNORMAL LOW (ref >60)
Lab: 8.6 mg/dL — ABNORMAL LOW (ref 8.5–10.6)
Lab: 8 (ref 3–12)

## 2019-11-24 LAB — CBC AND DIFF
Lab: 0.4 K/UL (ref 0–0.80)
Lab: 2.6 M/UL — ABNORMAL LOW (ref 4.0–5.0)
Lab: 202 10*3/uL (ref 150–400)
Lab: 21 % — ABNORMAL LOW (ref 36–45)
Lab: 25 pg — ABNORMAL LOW (ref 26–34)
Lab: 26 % — ABNORMAL HIGH (ref 11–15)
Lab: 31 g/dL — ABNORMAL LOW (ref 32.0–36.0)
Lab: 5.2 10*3/uL (ref 4.5–11.0)
Lab: 6.8 g/dL — ABNORMAL LOW (ref 12.0–15.0)
Lab: 8 FL (ref 7–11)
Lab: 81 FL (ref 80–100)

## 2019-11-24 LAB — GRAM STAIN

## 2019-11-24 LAB — PROTIME INR (PT): Lab: 1.4 mL/min — ABNORMAL HIGH (ref >60)

## 2019-11-24 NOTE — Progress Notes
Procedural and recovery education completed with patient at this time, all questions answered and patient verbalized understanding of above information. Paperwork with patient and AVS signed.

## 2019-11-24 NOTE — Patient Instructions
INTERVENTIONAL RADIOLOGY DISCHARGE INSTRUCTIONS  PARACENTESIS  A paracentesis is the removal of an abnormal buildup of fluid in your abdominal cavity.? This fluid buildup is called ascites and may be caused by conditions such as liver disease, heart failure, or cancer. During this procedure, a needle is inserted into your abdomen to drain the fluid.? The fluid may then be sent to the lab for testing if medically indicated.? Removal of the fluid may also relieve belly pressure and shortness of breath caused by the ascites.?  AFTER YOUR PROCEDURE:  ? You will recover in Interventional Radiology for a minimum of 30 minutes after your procedure.  ? You will be on bedrest with bathroom privileges after the procedure.?  POST-PROCEDURE PAIN:   ? Pain control following your procedure is a priority for both you and your Physicians.  ? Some soreness or tenderness at the site is to be expected for several days. We recommend taking over the counter analgesics to help relieve this pain.  ? Alternative methods for pain relief include but not limited to heat or cold compress, relaxation techniques, rest, and changing of positions.?  ? If pain continues after 5-7 days or you have severe pain not relieved by medication, please contact us as directed below.?  POST-PROCEDURE ACTIVITY:   ? A responsible adult must drive you home. ???  ? If you receive sedation, narcotic pain medication or anesthesia for the procedure, you should not drive or operate heavy machinery or do anything that requires concentration for at least 24 hours after procedure completion.  ? It is recommended that a responsible adult be with you until morning.?  POST-PROCEDURE SITE CARE:  ? You will have a small bandage over the procedure site.? Keep this dry.  ? You may remove it in 24 hours.  ? You may shower in 24 hours, after removing the bandage. ? A dry gauze bandage may be reapplied as necessary to protect your clothing as the site may sometimes leak for several days after the procedure.  ? Do not submerge the procedure site for 1 week (no bathtub, swimming, hot tub, etc.)  ? Do not use ointments, creams or powders on the puncture site.  ? Be sure your hands are clean when touching near the site.?  DIET/MEDICATIONS:   ? You may resume your previous diet after the procedure.  ? If you receive sedation or narcotic pain medications, avoid any foods or beverages containing alcohol for at least 24 hours after the procedure.  ? Please see the Medication Reconciliation sheet for instructions regarding resuming your home medications.?  CALL THE DOCTOR IF:   ? Bright red blood soaks the bandage.  ? You have pain not relieved by medication.? Some soreness at the site is to be expected.  ? You have signs of infection such as: fever greater than 101F, chills, redness, warmth, swelling, drainage or pus from the puncture site.?  For any of the above symptoms or for problems or concerns related to the procedure performed at the [facility], call?[phone] Monday-Friday from 7-5p.? After-hours and weekends, please call?[phone] and ask for the Interventional Radiology Resident on-call.  YOU OR YOUR CAREGIVER SHOULD CALL 911 FOR ANY SEVERE SYMPTOMS SUCH AS EXCESSIVE BLEEDING, SEVERE DIZZINESS, TROUBLE BREATHING OR LOSS OF CONSCIOUSNESS.  ?  ?

## 2019-11-24 NOTE — Telephone Encounter
Requested call back regarding lab orders.

## 2019-11-24 NOTE — H&P (View-Only)
IR Pre-Procedure History and Physical/Sedation Plan    Procedure Date: 11/24/2019     Planned Procedure(s):  Ultrasound-guided paracentesis     Indication:  Fluid testing; Therapeutic drainage  __________________________________________________________________    Chief Complaint:  Ascites    History of Present Illness: Kristin Moody is a 64 y.o. female with a history as listed below who presents today for procedure.    Patient Active Problem List    Diagnosis Date Noted   ? Anemia 09/28/2019   ? Encephalopathy 08/12/2019   ? Spasticity 08/10/2019   ? Hyperreflexia 08/10/2019   ? 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 Right leg weakness 05/14/2019   ? 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 Iron (Fe) deficiency anemia 01/19/2019   ? Preop cardiovascular exam 01/07/2019   ? Pre-transplant evaluation for liver transplant 01/06/2019   ? Cirrhosis (HCC) 12/16/2018   ? Acute on chronic anemia 12/16/2018   ? Lactic acidosis 12/16/2018   ? GI bleed 10/24/2018   ? Portal hypertension (HCC) 08/28/2018   ? Confusion 08/27/2018   ? Dyspnea 08/27/2018   ? Chronic abdominal pain 08/27/2018   ? 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 Acute on chronic blood loss anemia 08/27/2018   ? Iron deficiency anemia 04/13/2018   ? Pneumonia due to infectious organism 04/13/2018   ? Melena 04/10/2018   ? Esophageal varices without bleeding (HCC) 02/25/2018   ? Cirrhosis of liver without ascites (HCC) 02/25/2018   ? 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 Lower abdominal pain 10/24/2014   ? Chest discomfort 09/21/2014   ? Essential hypertension 08/13/2012   ? Abnormal liver function tests 05/13/2011   ? Fatty liver disease, nonalcoholic 05/13/2011   ? Obesity (BMI 30-39.9) 05/13/2011   ? Slow transit constipation 05/13/2011     Medical History:   Diagnosis Date   ? Aneurysm (HCC)    ? 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 Diverticulosis of colon descending and sigmoid colon   ? Dyspnea    ? Essential hypertension 08/13/2012   ? Fatty infiltration of liver    ? HTN (hypertension)    ? Obesity (BMI 30-39.9) 05/13/2011   ? Preop cardiovascular exam 01/07/2019   ? Sinus infection    ? Type 2 diabetes mellitus (HCC) 09/21/2014      Surgical History:   Procedure Laterality Date   ? HYSTERECTOMY  1994   ? UPPER GASTROINTESTINAL ENDOSCOPY  2009   ? COLONOSCOPY  2009   ? CYSTOCELE REPAIR  2009    with endocele repair   ? RECTOCELE REPAIR  03/2009   ? LIVER BIOPSY  07/17/2010   ? COLONOSCOPY N/A 11/08/2017    Performed by Dawna Part, MD at [facility] ENDO   ? ESOPHAGOGASTRODUODENOSCOPY N/A 11/08/2017    Performed by Dawna Part, MD at [facility] ENDO   ? ESOPHAGOGASTRODUODENOSCOPY WITH BIOPSY - FLEXIBLE  11/08/2017    Performed by Dawna Part, MD at [facility] ENDO   ? COLONOSCOPY WITH HOT BIOPSY FORCEPS REMOVAL TUMOR/ POLYP/ OTHER LESION  11/08/2017    Performed by Dawna Part, MD at [facility] ENDO   ? ESOPHAGOGASTRODUODENOSCOPY WITH BAND LIGATION ESOPHAGEAL/ GASTRIC VARICES - FLEXIBLE N/A 04/10/2018    Performed by Normajean Baxter, MD at [facility] ENDO   ? ESOPHAGOGASTRODUODENOSCOPY WITH BIOPSY - FLEXIBLE N/A 04/10/2018    Performed by Normajean Baxter, MD at [facility] ENDO   ?  ESOPHAGOGASTRODUODENOSCOPY WITH CONTROL OF BLEEDING - FLEXIBLE N/A 04/25/2018    Performed by Jolee Ewing, MD at [facility] ENDO   ? ESOPHAGOGASTRODUODENOSCOPY WITH BIOPSY - FLEXIBLE with push enteroscopy N/A 08/28/2018    Performed by Celesta Gentile, MD at [facility] ENDO   ? EGD N/A 10/27/2018    Performed by Eliott Nine, MD at [facility] ENDO   ? ESOPHAGOGASTRODUODENOSCOPY WITH SNARE REMOVAL TUMOR/ POLYP/ OTHER LESION - FLEXIBLE N/A 10/27/2018    Performed by Eliott Nine, MD at [facility] ENDO   ? ESOPHAGOGASTRODUODENOSCOPY WITH BIOPSY - FLEXIBLE N/A 12/16/2018    Performed by Buckles, Vinnie Level, MD at [facility] ENDO   ? ESOPHAGOGASTRODUODENOSCOPY WITH CONTROL OF BLEEDING - FLEXIBLE N/A 12/16/2018    Performed by Buckles, Vinnie Level, MD at [facility] ENDO ? ESOPHAGOGASTRODUODENOSCOPY WITH SPECIMEN COLLECTION BY BRUSHING/ WASHING N/A 01/22/2019    Performed by Veneta Penton, MD at [facility] ENDO   ? ESOPHAGOGASTRODUODENOSCOPY WITH DILATION ESOPHAGUS WITH BALLOON 30 MM OR GREATER - FLEXIBLE N/A 01/22/2019    Performed by Veneta Penton, MD at [facility] ENDO   ? ESOPHAGOGASTRODUODENOSCOPY WITH BIOPSY - FLEXIBLE N/A 01/22/2019    Performed by Veneta Penton, MD at [facility] ENDO   ? ESOPHAGOGASTRODUODENOSCOPY WITH BIOPSY - FLEXIBLE N/A 06/19/2019    Performed by Dawna Part, MD at [facility] ENDO   ? ESOPHAGOGASTRODUODENOSCOPY [WITH APC]  WITH SPECIMEN COLLECTION BY BRUSHING/ WASHING N/A 08/07/2019    Performed by Dawna Part, MD at [facility] ENDO   ? ESOPHAGOGASTRODUODENOSCOPY WITH SPECIMEN COLLECTION BY BRUSHING/ WASHING N/A 09/10/2019    Performed by Buckles, Vinnie Level, MD at [facility] ENDO   ? ESOPHAGOGASTRODUODENOSCOPY WITH CONTROL OF BLEEDING - FLEXIBLE N/A 09/10/2019    Performed by Buckles, Vinnie Level, MD at [facility] ENDO   ? CHOLECYSTECTOMY     ? COLONOSCOPY     ? ESOPHAGOGASTRIC FUNDOPLICATION  2003, 2004    laparoscopic   ? ESOPHAGOGASTRIC FUNDOPLICATION     ? HX HYSTERECTOMY     ? PR ESOPHAGOSCOPY FLEXIBLE TRANSORAL DIAGNOSTIC        Social History     Tobacco Use   ? Smoking status: Never Smoker   ? Smokeless tobacco: Never Used   Substance Use Topics   ? Alcohol use: No      Family History   Problem Relation Age of Onset   ? Other Mother    ? Kidney Failure Mother    ? Osteoporosis Mother    ? Cancer Father         esophageal   ? Liver Disease Sister         endstage, s/p liver transplant   ? Cancer Brother         tonsil, liver    ? Hip Fracture Neg Hx       Medications Prior to Admission   Medication Sig Dispense Refill Last Dose   ? atorvastatin (LIPITOR) 10 mg tablet Take one tablet by mouth daily. 90 tablet 3    ? cetirizine (ZYRTEC) 10 mg tablet Take 10 mg by mouth daily as needed for Allergy symptoms.      ? duloxetine DR (CYMBALTA) 60 mg capsule Take 60 mg by mouth daily. ? furosemide (LASIX) 20 mg tablet TAKE 3 TABLETS BY MOUTH EVERY MORNING 270 tablet 1    ? lactulose 10 gram/15 mL oral solution Take 30 mL by mouth as Needed. 236 mL 1    ? miconazole (MONISTAT 3) 200 mg/5 gram (4 %) vaginal cream  Insert or Apply  to vaginal area at bedtime as needed.      ? pantoprazole DR (PROTONIX) 40 mg tablet TAKE 1 TABLET BY MOUTH TWICE A DAY 180 tablet 1    ? polyethylene glycol 3350 (MIRALAX) 17 g packet Take one packet by mouth twice daily. (Patient taking differently: Take 17 g by mouth twice daily as needed (uses 2-3x/day PRN).)      ? promethazine (PHENERGAN) 25 mg tablet Take 25 mg by mouth every 4 hours as needed. Just uses it with Tramadol      ? rifAXIMin (XIFAXAN) 550 mg tablet Take one tablet by mouth twice daily. 60 tablet 1    ? spironolactone (ALDACTONE) 50 mg tablet Take two tablets by mouth daily. Take with food.  Indications: ascites, accumulation of fluid caused by cirrhosis of the liver 180 tablet 1    ? zinc sulfate 220 mg (50 mg elemental zinc) capsule Take one capsule by mouth daily. 90 capsule 3      Allergies   Allergen Reactions   ? Adhesive Tape (Rosins) RASH   ? Contrast Dye Iv, Iodine Containing [Iodinated Contrast Media] RASH   ? Sulfa (Sulfonamide Antibiotics) RASH   ? Codeine NAUSEA AND VOMITING   ? Morphine SEE COMMENTS     Pt reports burns her veins   ? Penicillin G SEE COMMENTS     Throat issues, blisters in throat    ? Tramadol NAUSEA ONLY and ITCHING     Takes this medication regularly        Review of Systems  A comprehensive review of systems was negative.    Physical Exam:  Vital Signs: Last Filed In 24 Hours Vital Signs: 24 Hour Range                General appearance: alert and no distress noted.  Neurologic: Grossly normal.  Lungs: Non labored.  Heart: regular rate and rhythm  Abdomen: distended    Pre-procedure anxiolysis plan: N/A  Sedation/Medication Plan: Local anesthetic  Personal history of sedation complications: Denies adverse event. Family history of sedation complications: Denies adverse event.   Medications for Reversal: NA  Discussion/Reviews:  Physician has discussed risks and alternatives of this type of sedation and above planned procedures with patient    NPO Status: NA  Airway:  NA  Head and Neck: NA  Mouth: NA   Anesthesia Classification:  ASA III (A patient with a severe systemic disease that limits activity, but is not incapacitating)  Pregnancy Status: Not Pregnant    Lab/Radiology/Other Diagnostic Tests:  Labs:  Pertinent labs reviewed           Humberto Addo P Divine-Thiele, APRN-NP  Pager [phone]

## 2019-11-24 NOTE — Progress Notes
Spoke with Kathryne Eriksson, RN - pt OK to discharge. Transplant service will call patient with transfusion appointment follow up.

## 2019-11-24 NOTE — Other
Immediate Post Procedure Note    Date:  11/24/2019                                         Attending Physician:   Ronny Flurry  Performing Provider:  Corwin Levins, MD    Consent:  Consent obtained from patient.  Time out performed: Consent obtained, correct patient verified, correct procedure verified, correct site verified, patient marked as necessary.  Pre/Post Procedure Diagnosis:  Ascites  Indications:  Ascites    Anesthesia: local lidocaine  Procedure(s):  paracentesis  Findings:  Yellow tinged ascitic fluid     Estimated Blood Loss:  None/Negligible  Specimen(s) Removed/Disposition:  Yes, sent to pathology  Complications: None  Patient Tolerated Procedure: Well  Post-Procedure Condition:  stable    Corwin Levins, MD  Pager [phone]

## 2019-11-24 NOTE — Telephone Encounter
Per Kathryne Eriksson, RN in Hepatology clinic, patient needs blood transfusion for hgb of 6.8. 1 unit ordered, TACO rate. RN notified Baxter Flattery we can take patient for transfusion in HF Infusion Clinic on Thursday afternoon. Tara agreeable.    RN call patient to get her scheduled. RN informed patient she will need to get T&C drawn in Hepatology Clinic at 11:30 am on Thursday 1/7, then will come for blood transfusion in the HF Infusion Clinic at 12:30 pm. Reminded patient to check in at the [facility] prior to coming up for transfusion, patient verbalized understanding and has no further questions.     Baxter Flattery updated with plan. T&C and transfusion orders are in. Blood consent already on file.

## 2019-11-26 ENCOUNTER — Ambulatory Visit: Admit: 2019-11-26 | Discharge: 2019-11-26 | Payer: MEDICARE

## 2019-11-26 ENCOUNTER — Ambulatory Visit: Admit: 2019-11-26 | Discharge: 2019-11-27 | Payer: MEDICARE

## 2019-11-26 ENCOUNTER — Encounter: Admit: 2019-11-26 | Discharge: 2019-11-26 | Payer: MEDICARE

## 2019-11-26 DIAGNOSIS — K760 Fatty (change of) liver, not elsewhere classified: Secondary | ICD-10-CM

## 2019-11-26 DIAGNOSIS — K7460 Unspecified cirrhosis of liver: Secondary | ICD-10-CM

## 2019-11-26 DIAGNOSIS — K31819 Angiodysplasia of stomach and duodenum without bleeding: Secondary | ICD-10-CM

## 2019-11-26 DIAGNOSIS — K7469 Other cirrhosis of liver: Secondary | ICD-10-CM

## 2019-11-26 DIAGNOSIS — D500 Iron deficiency anemia secondary to blood loss (chronic): Secondary | ICD-10-CM

## 2019-11-26 LAB — CBC AND DIFF
Lab: 11 % (ref 4–12)
Lab: 2 % — ABNORMAL LOW (ref >60)
Lab: 2.8 M/UL — ABNORMAL LOW (ref 4.0–5.0)
Lab: 227 K/UL — ABNORMAL HIGH (ref 150–400)
Lab: 23 % — ABNORMAL LOW (ref 36–45)
Lab: 25 pg — ABNORMAL LOW (ref 26–34)
Lab: 26 % — ABNORMAL HIGH (ref 11–15)
Lab: 30 g/dL — ABNORMAL LOW (ref 32.0–36.0)
Lab: 5.5 10*3/uL (ref 1.8–7.0)
Lab: 7.2 g/dL — ABNORMAL LOW (ref 12.0–15.0)
Lab: 7.3 K/UL (ref 4.5–11.0)
Lab: 75 % (ref 41–77)
Lab: 8.2 FL (ref 7–11)
Lab: 82 FL (ref 80–100)

## 2019-11-26 LAB — COMPREHENSIVE METABOLIC PANEL
Lab: 136 MMOL/L — ABNORMAL LOW (ref 137–147)
Lab: 21 U/L — ABNORMAL LOW (ref 7–56)
Lab: 4.2 MMOL/L (ref 3.5–5.1)
Lab: 56 mL/min — ABNORMAL LOW (ref >60)

## 2019-11-26 LAB — PROTIME INR (PT): Lab: 1.3 — ABNORMAL HIGH (ref 0.8–1.2)

## 2019-11-26 NOTE — Progress Notes
Hgb today 7.2. Kathryne Eriksson, RN working with Dr. Lovena Le notified. Per Baxter Flattery, proceed with 1 unit of PRBCs.

## 2019-11-26 NOTE — Telephone Encounter
Lovena Le called back, the correct phone # is [phone]

## 2019-11-26 NOTE — Patient Instructions
HEART FAILURE INFUSION CLINIC  OUTPATIENT POST TRANSFUSION INSTRUCTIONS     During your transfusion you were monitored by nursing staff for signs of a transfusion reaction, however, you should continue to observe for the following symptoms after you have been dismissed from the clinic:     FEVER OF 100.5 OR HIGHER  CHILLS  NAUSEA OR VOMITING  FEELING FAINT OR DIZZY  SHORTNESS OF BREATH  DARK OR RED COLORED URINE  CHEST OR BACK PAIN  SHOCK OR LOSS OF CONSCIOUSNESS  YELLOWING OF THE EYES OR SKIN     If you or your caregiver notice any of these symptoms, contact your ordering physician immediately and go to the nearest Emergency Department for treatment. When you arrive at the Emergency Department notify them that you recently received a blood transfusion. *For more detailed signs and symptoms of a transfusion reaction, please refer to the [place] education information below.          When You Need a Blood Transfusion (Adult)  A blood transfusion may be done when you have lost blood because of an injury or during surgery. It can also be done because of diseases or conditions that affect the blood. Blood is made up of several different parts (blood products). You may receive some or all of these blood products during a transfusion. Blood for transfusion is usually donated from another person (donor). Strict measures are taken to make sure that donated blood is safe before it's given to you. This sheet helps you understand how a blood transfusion is done. Your healthcare provider will discuss your condition with you and answer your questions.   The parts of blood  Blood can be broken down into different parts that perform special roles in the body. These parts include:  ? Red blood cells, which carry oxygen throughout the body.  ? Platelets, which help stop bleeding.  ? Plasma (the liquid part of blood), which carries red blood cells and platelets throughout the body. Plasma also helps platelets in stopping bleeding. Where does donated blood come from?  ? Volunteer donors. These are people who donate their blood to help others in need of blood. Blood donation can take place at several places, including a hospital, blood bank, or during a blood drive.  ? Directed donation. If you need a blood transfusion during a planned surgery, family and friends can have their blood tested for compatibility and donate blood for you before the surgery. This needs to be done at least 7 day(s) in advance. This is because the blood must be tested for safety.  ? Autologous donation. This is also called self-donation. For planned surgery, you can donate your own blood starting up to 6 weeks before surgery.  Are blood transfusions safe?  Donated blood is tested and processed to make sure that the blood is safe:  ? The health and medical history of each donor is carefully screened. If a person is considered high-risk for infection or problems, he or she isn't accepted as a blood donor.  ? Donated blood is tested for infections such as hepatitis, syphilis, and HIV (the virus that causes AIDS). If the tested blood is found to be unsafe, it's destroyed.  ? Blood is divided into four types: A, B, AB, and O. Blood also has Rh types: positive (+) and negative (-). You can only receive blood products that are compatible with (match) your blood type. A sample of your blood is tested for compatibility with donated blood. This is  done before blood products are prepared for a transfusion.  How is a blood transfusion done?  A blood transfusion takes place in a blood center, infusion center, hospital room, or operating room. Your healthcare provider will discuss the blood transfusion with you before it's done. You'll need to give permission for the blood transfusion by signing a consent form.  ? Two healthcare providers confirm your identity. They also confirm that they have the correct blood product(s) for you. ? An intravenous (IV) line is placed in a vein if you do not already have an IV.  ? The blood product comes in a plastic bag that is hung on an IV pole. The blood product flows from the bag into your IV line. The IV line may be connected to a pump, which controls the transfusion rate. You may receive more than one kind of blood product through the IV.  ? Your vital signs (blood pressure, heart rate, respiratory rate, and temperature) are checked throughout the transfusion. This is to make sure you are not having a reaction to the blood product.  ? The IV line may be removed once the transfusion is complete.  Possible risks and complications of blood transfusions  Most transfusions are problem free. In some cases, reactions occur. These can happen within seconds to minutes during the transfusion or a week to a few months after the transfusion. Call your doctor or nurse right away if you have any of the signs or symptoms in the table below during or after a transfusion:  Reaction Timing Symptoms   Allergic reaction (mild) ? Within seconds to minutes during the transfusion  ? Up to 24 hours after the transfusion Hives or red welts on the skin, mild itching, rash, localized swelling, flushing (red face), wheezing, shortness of breath, or stridor (high-pitched noise or sound)   Anaphylactic reaction ? Within seconds to minutes during the transfusion  ? Up to 24 hours after the transfusion Shortness of breath, flushing (red face), wheezing, labored (working hard) breathing, low blood pressure, localized swelling, chest tightness, or cramps   Febrile nonhemolytic reaction ? Within minutes to hours during the transfusion  ? Within a few hours to 24 hours after the transfusion Fever (increase of 1? C or higher), chills, flushing (red face), nausea, headache, minor discomfort, or mild shortness of breath   Acute immune hemolytic reaction ? Within minutes during the transfusion ? Up to 24 hours after the transfusion Fever, red or brown urine, back pain, fast heart rate (tachycardia), abdominal pain, low blood pressure, feeling anxious, chills, chest pain, nausea, or fainting spells   Transfusion-related acute lung injury (TRALI) ? Within 1 to 2 hours during the transfusion  ? Up to 6 hours after the transfusion Shortness of breath, trouble breathing, low blood pressure, fever, pulmonary edema   Transfusion-associated circulatory overload ? Near the end of the transfusion  ? Within 6 hours after the transfusion Shortness of breath, fast heart rate (tachycardia), problems breathing when lying on back, abnormal blood pressure   Post-transfusion purpura (PUP) ? Within 1 week  ? Up to 48 days after the transfusion Purple spots on skin; nose bleed; bleeding from the urinary tract, abdomen, colon, or rectum; fever; or chills     Delayed transfusion-related acute lung injury (TRALI) ? Within 72 hours (3 days) after the transfusion Sudden onset of respiratory distress or trouble breathing   Delayed hemolytic reaction ? Within 3 to 7 days  ? Up to weeks after the transfusion Low-grade  fever, mild jaundice (yellowing of the skin and whites of the eyes), decrease in hematocrit, chills, chest pain, back pain, nausea   ? 2000-2019 [employer], [place]. [address], [place], [place] [address]. All rights reserved. This information is not intended as a substitute for professional medical care. Always follow your healthcare professional's instructions.

## 2019-11-26 NOTE — Telephone Encounter
Please return a call to [employer] in the lab regarding lab orders for [place].

## 2019-11-27 ENCOUNTER — Encounter: Admit: 2019-11-27 | Discharge: 2019-11-27 | Payer: MEDICARE

## 2019-11-27 DIAGNOSIS — K7460 Unspecified cirrhosis of liver: Secondary | ICD-10-CM

## 2019-11-27 NOTE — Telephone Encounter
Requested call regarding lab results.

## 2019-12-02 ENCOUNTER — Encounter: Admit: 2019-12-02 | Discharge: 2019-12-02 | Payer: MEDICARE

## 2019-12-02 DIAGNOSIS — K31819 Angiodysplasia of stomach and duodenum without bleeding: Secondary | ICD-10-CM

## 2019-12-02 DIAGNOSIS — R945 Abnormal results of liver function studies: Secondary | ICD-10-CM

## 2019-12-02 LAB — CBC AND DIFF
Lab: 8.1
Lab: 0.1 /uL (ref 0.0–0.2)
Lab: 0.2 /uL (ref 0.0–0.6)
Lab: 0.8 /uL — ABNORMAL LOW (ref 0.9–5.1)
Lab: 1.1 % — ABNORMAL HIGH (ref 0.0–2.0)
Lab: 193 uL — ABNORMAL HIGH (ref 130–400)
Lab: 27 % — ABNORMAL LOW (ref 37.0–47.0)
Lab: 3.3 uL — ABNORMAL LOW (ref 4.20–5.40)
Lab: 8.2 g/dL — ABNORMAL LOW (ref 12.0–16.0)

## 2019-12-02 LAB — COMPREHENSIVE METABOLIC PANEL
Lab: 1 mg/dL — ABNORMAL LOW (ref 0.2–1.2)
Lab: 1.2 mg/dL — ABNORMAL HIGH (ref 0.57–1.11)
Lab: 14 meq/L — ABNORMAL LOW (ref 0–14)
Lab: 24 mmol/L (ref 23–31)
Lab: 25
Lab: 28 mg/dL — ABNORMAL HIGH (ref 9.8–20.1)
Lab: 36 % — ABNORMAL HIGH (ref 5–34)
Lab: 47 /uL — ABNORMAL LOW (ref >59)
Lab: 6.4 g/dL (ref 6.2–8.1)
Lab: 8.4 mg/dL — ABNORMAL HIGH (ref 8.4–10.2)

## 2019-12-09 ENCOUNTER — Encounter

## 2019-12-09 DIAGNOSIS — I729 Aneurysm of unspecified site: Secondary | ICD-10-CM

## 2019-12-09 DIAGNOSIS — I10 Essential (primary) hypertension: Secondary | ICD-10-CM

## 2019-12-09 DIAGNOSIS — K7460 Unspecified cirrhosis of liver: Secondary | ICD-10-CM

## 2019-12-09 DIAGNOSIS — J329 Chronic sinusitis, unspecified: Secondary | ICD-10-CM

## 2019-12-09 DIAGNOSIS — R0600 Dyspnea, unspecified: Secondary | ICD-10-CM

## 2019-12-09 DIAGNOSIS — Z01810 Encounter for preprocedural cardiovascular examination: Secondary | ICD-10-CM

## 2019-12-09 DIAGNOSIS — E669 Obesity, unspecified: Secondary | ICD-10-CM

## 2019-12-09 DIAGNOSIS — E119 Type 2 diabetes mellitus without complications: Secondary | ICD-10-CM

## 2019-12-09 DIAGNOSIS — K31819 Angiodysplasia of stomach and duodenum without bleeding: Secondary | ICD-10-CM

## 2019-12-09 DIAGNOSIS — D500 Iron deficiency anemia secondary to blood loss (chronic): Secondary | ICD-10-CM

## 2019-12-09 DIAGNOSIS — K5730 Diverticulosis of large intestine without perforation or abscess without bleeding: Secondary | ICD-10-CM

## 2019-12-09 DIAGNOSIS — K760 Fatty (change of) liver, not elsewhere classified: Secondary | ICD-10-CM

## 2019-12-09 DIAGNOSIS — G541 Lumbosacral plexus disorders: Secondary | ICD-10-CM

## 2019-12-09 DIAGNOSIS — K7469 Other cirrhosis of liver: Secondary | ICD-10-CM

## 2019-12-09 DIAGNOSIS — R945 Abnormal results of liver function studies: Secondary | ICD-10-CM

## 2019-12-09 LAB — COMPREHENSIVE METABOLIC PANEL
Lab: 1.1 mg/dL (ref 0.3–1.2)
Lab: 1.1 mg/dL — ABNORMAL HIGH (ref 0.4–1.00)
Lab: 104 MMOL/L (ref 98–110)
Lab: 134 mg/dL — ABNORMAL HIGH (ref 70–100)
Lab: 137 MMOL/L (ref 137–147)
Lab: 164 U/L — ABNORMAL HIGH (ref 25–110)
Lab: 23 U/L (ref 7–56)
Lab: 25 MMOL/L (ref 21–30)
Lab: 3 g/dL — ABNORMAL LOW (ref 3.5–5.0)
Lab: 3.8 MMOL/L (ref 3.5–5.1)
Lab: 30 mg/dL — ABNORMAL HIGH (ref 7–25)
Lab: 33 U/L (ref 7–40)
Lab: 47 mL/min — ABNORMAL LOW (ref >60)
Lab: 57 mL/min — ABNORMAL LOW (ref >60)
Lab: 6.3 g/dL (ref 6.0–8.0)
Lab: 8.6 mg/dL (ref 8.5–10.6)
Lab: 8 (ref 3–12)

## 2019-12-09 LAB — CBC AND DIFF
Lab: 0 10*3/uL (ref 0–0.45)
Lab: 0.8 10*3/uL — ABNORMAL HIGH (ref 0–0.80)
Lab: 0.8 10*3/uL — ABNORMAL LOW (ref 1.0–4.8)
Lab: 1 % (ref 0–2)
Lab: 1 % (ref 0–5)
Lab: 10 % (ref 4–12)
Lab: 219 10*3/uL (ref 150–400)
Lab: 23 % — ABNORMAL HIGH (ref 11–15)
Lab: 24 % — ABNORMAL LOW (ref 36–45)
Lab: 24 pg — ABNORMAL LOW (ref 26–34)
Lab: 3.2 M/UL — ABNORMAL LOW (ref 4.0–5.0)
Lab: 31 g/dL — ABNORMAL LOW (ref 32.0–36.0)
Lab: 7.1 10*3/uL — ABNORMAL HIGH (ref 1.8–7.0)
Lab: 7.7 g/dL — ABNORMAL LOW (ref 12.0–15.0)
Lab: 76 FL — ABNORMAL LOW (ref 80–100)
Lab: 79 % — ABNORMAL HIGH (ref 41–77)
Lab: 8.4 FL (ref 7–11)
Lab: 9 % — ABNORMAL LOW (ref 24–44)
Lab: 9 10*3/uL (ref 4.5–11.0)

## 2019-12-09 LAB — GRAM STAIN

## 2019-12-09 LAB — PROTIME INR (PT): Lab: 1.4 — ABNORMAL HIGH (ref 0.8–1.2)

## 2019-12-09 NOTE — Progress Notes
Date of Service: 12/09/2019    Subjective:             Kristin Moody is a 64 y.o. female with right leg weakness who is here for follow-up.    History of Present Illness        She reports that not much changed. Right foot and leg are about the same. No weakness in the other limbs   No falls     Had PT and I saw her in September and she was still getting therapy. Found it helpful. Still doing exercises on her own    No dysphagia    SOB when she has volume overload because of her liver disease    Still on liver transplant list     Review of Systems   Cardiovascular: Positive for leg swelling.   Gastrointestinal: Positive for abdominal pain.   Neurological: Positive for numbness (right foot and lower right leg).   All other systems reviewed and are negative.        Objective:         ? atorvastatin (LIPITOR) 10 mg tablet Take one tablet by mouth daily.   ? cetirizine (ZYRTEC) 10 mg tablet Take 10 mg by mouth daily as needed for Allergy symptoms.   ? duloxetine DR (CYMBALTA) 60 mg capsule Take 60 mg by mouth daily.   ? furosemide (LASIX) 20 mg tablet TAKE 3 TABLETS BY MOUTH EVERY MORNING   ? pantoprazole DR (PROTONIX) 40 mg tablet TAKE 1 TABLET BY MOUTH TWICE A DAY   ? polyethylene glycol 3350 (MIRALAX) 17 g packet Take one packet by mouth twice daily. (Patient taking differently: Take 17 g by mouth twice daily as needed (uses 2-3x/day PRN).)   ? promethazine (PHENERGAN) 25 mg tablet Take 25 mg by mouth every 4 hours as needed. Just uses it with Tramadol   ? rifAXIMin (XIFAXAN) 550 mg tablet Take one tablet by mouth twice daily.   ? spironolactone (ALDACTONE) 50 mg tablet Take two tablets by mouth daily. Take with food.  Indications: ascites, accumulation of fluid caused by cirrhosis of the liver   ? zinc sulfate 220 mg (50 mg elemental zinc) capsule Take one capsule by mouth daily.     Vitals:    12/09/19 0954   Weight: 72.6 kg (160 lb)   Height: 160 cm (62.99)   PainSc: Zero Body mass index is 28.35 kg/m?Marland Kitchen     Physical Exam            Physical Exam  ?  GEN: In no apparent distress.  MS:?Awake, alert, attentive. Normal language.    MOTOR:?    ?  Neck flexors 5    Neck extensors  5    ?  Shoulder abd (R, L)  5  5  Hip flexors (R, L) 4+ 5    Elbow flexors  5  5  Hip abductors? 5 5   Elbow extensors  5  5  Hip adductors 5 5   Wrist flexors? 5 5 Knee flexors? 5 5   Wrist extensors  5  5  Knee extensors  5 5   Finger abductors 5 5 Plantar flexion 5 5   Finger extensors 5 5? Dorsiflexion?? 5 5    Distal finger flex 5 5 Ankle inversion 5 5   APB 5 5 Ankle eversion 5 5   FPL 5 5 EHL 5 5   SENSORY:   Normal to pinprick and vibration in the LEs  Normal proprioception  ?  tricep reflex (R, L) 2+ 2+   Biceps 2+ 2+   brachiorad 2+ 2+   knee 3 3   ankle? 3 3   plantar response flexor flexor   hoffman Positive  Positive   Clonus in both ankles?  ?  Station: Romberg sign: Absent  Gait: Normal base, normal speed, symmetric, preserved arm swing. Cannot do heel gait  ?  Negative Romberg  ?      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 Improved significantly. Strength is back to normal on examination with no clear sensory deficit.  No further work-up is needed at this point.  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   I encouraged her to continue the exercises she learned from physical therapy        RTC as needed        Total time 30 minutes.  More than 50% of the total time was used for counseling.  Counseled patient regarding recommended approach to her radiculoplexus neuropathy    This note was in part completed with Dragon, a speech recognition software.  Some grammatical and transcription errors may have occurred.  If you have any concern, please contact my office for clarification    Retta Diones, MD  Assistant Professor  Neurology/Neuromuscular Medicine

## 2019-12-09 NOTE — H&P (View-Only)
Pre Procedure History and Physical/Sedation Plan-OP    Procedure Date: 12/09/2019     Planned Procedure(s):  US guided paracentesis    Indication for exam:  Fluid testing, therapeutic drainage  __________________________________________________________________    Chief Complaint:  Recurrent ascites    History of Present Illness: Kristin Moody is a 64 y.o. 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    Patient Active Problem List    Diagnosis Date Noted   ? 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 Anemia 09/28/2019   ? Encephalopathy 08/12/2019   ? Spasticity 08/10/2019   ? Hyperreflexia 08/10/2019   ? 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 Right leg weakness 05/14/2019   ? 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 Iron (Fe) deficiency anemia 01/19/2019   ? Preop cardiovascular exam 01/07/2019   ? Pre-transplant evaluation for liver transplant 01/06/2019   ? Cirrhosis (HCC) 12/16/2018   ? Acute on chronic anemia 12/16/2018   ? Lactic acidosis 12/16/2018   ? GI bleed 10/24/2018   ? Portal hypertension (HCC) 08/28/2018   ? Confusion 08/27/2018   ? Dyspnea 08/27/2018   ? Chronic abdominal pain 08/27/2018   ? 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 Acute on chronic blood loss anemia 08/27/2018   ? Iron deficiency anemia 04/13/2018   ? Pneumonia due to infectious organism 04/13/2018   ? Melena 04/10/2018   ? Esophageal varices without bleeding (HCC) 02/25/2018   ? Cirrhosis of liver without ascites (HCC) 02/25/2018   ? 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 Lower abdominal pain 10/24/2014   ? Chest discomfort 09/21/2014   ? Essential hypertension 08/13/2012   ? Abnormal liver function tests 05/13/2011   ? Fatty liver disease, nonalcoholic 05/13/2011   ? Obesity (BMI 30-39.9) 05/13/2011   ? Slow transit constipation 05/13/2011     Medical History:   Diagnosis Date   ? Aneurysm (HCC)    ? 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 Diverticulosis of colon     descending and sigmoid colon   ? Dyspnea    ? Essential hypertension 08/13/2012   ? Fatty infiltration of liver    ? HTN (hypertension)    ? Obesity (BMI 30-39.9) 05/13/2011   ? Preop cardiovascular exam 01/07/2019   ? Sinus infection    ? Type 2 diabetes mellitus (HCC) 09/21/2014      Surgical History:   Procedure Laterality Date   ? HYSTERECTOMY  1994   ? UPPER GASTROINTESTINAL ENDOSCOPY  2009   ? COLONOSCOPY  2009   ? CYSTOCELE REPAIR  2009    with endocele repair   ? RECTOCELE REPAIR  03/2009   ? LIVER BIOPSY  07/17/2010   ? COLONOSCOPY N/A 11/08/2017    Performed by Dawna Part, MD at [facility] ENDO   ? ESOPHAGOGASTRODUODENOSCOPY N/A 11/08/2017    Performed by Dawna Part, MD at [facility] ENDO   ? ESOPHAGOGASTRODUODENOSCOPY WITH BIOPSY - FLEXIBLE  11/08/2017    Performed by Dawna Part, MD at [facility] ENDO   ? COLONOSCOPY WITH HOT BIOPSY FORCEPS REMOVAL TUMOR/ POLYP/ OTHER LESION  11/08/2017    Performed by Dawna Part, MD at [facility] ENDO   ? ESOPHAGOGASTRODUODENOSCOPY WITH BAND LIGATION ESOPHAGEAL/ GASTRIC VARICES - FLEXIBLE N/A 04/10/2018    Performed by Normajean Baxter, MD at [facility] ENDO   ? ESOPHAGOGASTRODUODENOSCOPY WITH BIOPSY - FLEXIBLE N/A 04/10/2018    Performed by  Normajean Baxter, MD at [facility] ENDO   ? ESOPHAGOGASTRODUODENOSCOPY WITH CONTROL OF BLEEDING - FLEXIBLE N/A 04/25/2018    Performed by Jolee Ewing, MD at [facility] ENDO   ? ESOPHAGOGASTRODUODENOSCOPY WITH BIOPSY - FLEXIBLE with push enteroscopy N/A 08/28/2018    Performed by Celesta Gentile, MD at [facility] ENDO   ? EGD N/A 10/27/2018    Performed by Eliott Nine, MD at [facility] ENDO   ? ESOPHAGOGASTRODUODENOSCOPY WITH SNARE REMOVAL TUMOR/ POLYP/ OTHER LESION - FLEXIBLE N/A 10/27/2018    Performed by Eliott Nine, MD at [facility] ENDO   ? ESOPHAGOGASTRODUODENOSCOPY WITH BIOPSY - FLEXIBLE N/A 12/16/2018    Performed by Buckles, Vinnie Level, MD at [facility] ENDO ? ESOPHAGOGASTRODUODENOSCOPY WITH CONTROL OF BLEEDING - FLEXIBLE N/A 12/16/2018    Performed by Buckles, Vinnie Level, MD at [facility] ENDO   ? ESOPHAGOGASTRODUODENOSCOPY WITH SPECIMEN COLLECTION BY BRUSHING/ WASHING N/A 01/22/2019    Performed by Veneta Penton, MD at [facility] ENDO   ? ESOPHAGOGASTRODUODENOSCOPY WITH DILATION ESOPHAGUS WITH BALLOON 30 MM OR GREATER - FLEXIBLE N/A 01/22/2019    Performed by Veneta Penton, MD at [facility] ENDO   ? ESOPHAGOGASTRODUODENOSCOPY WITH BIOPSY - FLEXIBLE N/A 01/22/2019    Performed by Veneta Penton, MD at [facility] ENDO   ? ESOPHAGOGASTRODUODENOSCOPY WITH BIOPSY - FLEXIBLE N/A 06/19/2019    Performed by Dawna Part, MD at [facility] ENDO   ? ESOPHAGOGASTRODUODENOSCOPY [WITH APC]  WITH SPECIMEN COLLECTION BY BRUSHING/ WASHING N/A 08/07/2019    Performed by Dawna Part, MD at [facility] ENDO   ? ESOPHAGOGASTRODUODENOSCOPY WITH SPECIMEN COLLECTION BY BRUSHING/ WASHING N/A 09/10/2019    Performed by Buckles, Vinnie Level, MD at [facility] ENDO   ? ESOPHAGOGASTRODUODENOSCOPY WITH CONTROL OF BLEEDING - FLEXIBLE N/A 09/10/2019    Performed by Buckles, Vinnie Level, MD at [facility] ENDO   ? CHOLECYSTECTOMY     ? COLONOSCOPY     ? ESOPHAGOGASTRIC FUNDOPLICATION  2003, 2004    laparoscopic   ? ESOPHAGOGASTRIC FUNDOPLICATION     ? HX HYSTERECTOMY     ? PR ESOPHAGOSCOPY FLEXIBLE TRANSORAL DIAGNOSTIC        Medications Prior to Admission   Medication Sig Dispense Refill Last Dose   ? atorvastatin (LIPITOR) 10 mg tablet Take one tablet by mouth daily. 90 tablet 3    ? cetirizine (ZYRTEC) 10 mg tablet Take 10 mg by mouth daily as needed for Allergy symptoms.      ? duloxetine DR (CYMBALTA) 60 mg capsule Take 60 mg by mouth daily.      ? furosemide (LASIX) 20 mg tablet TAKE 3 TABLETS BY MOUTH EVERY MORNING 270 tablet 1    ? pantoprazole DR (PROTONIX) 40 mg tablet TAKE 1 TABLET BY MOUTH TWICE A DAY 180 tablet 1 ? polyethylene glycol 3350 (MIRALAX) 17 g packet Take one packet by mouth twice daily. (Patient taking differently: Take 17 g by mouth twice daily as needed (uses 2-3x/day PRN).)      ? promethazine (PHENERGAN) 25 mg tablet Take 25 mg by mouth every 4 hours as needed. Just uses it with Tramadol      ? rifAXIMin (XIFAXAN) 550 mg tablet Take one tablet by mouth twice daily. 60 tablet 1    ? spironolactone (ALDACTONE) 50 mg tablet Take two tablets by mouth daily. Take with food.  Indications: ascites, accumulation of fluid caused by cirrhosis of the liver 180 tablet 1    ? zinc sulfate 220 mg (50 mg elemental zinc) capsule Take one capsule  by mouth daily. 90 capsule 3      Allergies   Allergen Reactions   ? Adhesive Tape (Rosins) RASH   ? Contrast Dye Iv, Iodine Containing [Iodinated Contrast Media] RASH   ? Sulfa (Sulfonamide Antibiotics) RASH   ? Codeine NAUSEA AND VOMITING   ? Morphine SEE COMMENTS     Pt reports burns her veins   ? Penicillin G SEE COMMENTS     Throat issues, blisters in throat    ? Tramadol NAUSEA ONLY and ITCHING     Takes this medication regularly        Social History:   Social History     Tobacco Use   ? Smoking status: Never Smoker   ? Smokeless tobacco: Never Used   Substance Use Topics   ? Alcohol use: No      Family History   Problem Relation Age of Onset   ? Other Mother    ? Kidney Failure Mother    ? Osteoporosis Mother    ? Cancer Father         esophageal   ? Liver Disease Sister         endstage, s/p liver transplant   ? Cancer Brother         tonsil, liver    ? Hip Fracture Neg Hx         Review of Systems  Constitutional: negative for fevers  Respiratory: negative for cough or increased work of breathing  Cardiovascular: negative for chest pain  Gastrointestinal: negative for nausea and vomiting    Previous Anesthetic/Sedation History:  Reviewed    Code Status: Full Code    Physical Exam:  Vital Signs: Last Filed In 24 Hours Vital Signs: 24 Hour Range   BP: 119/59 (01/20 1316) Temp: 36.7 ?C (98.1 ?F) (01/20 1316)  Pulse: 93 (01/20 1316)  Respirations: 18 PER MINUTE (01/20 1316)  SpO2: 100 % (01/20 1316)  SpO2 Pulse: 93 (01/20 1316)  Height: 160 cm (62.99) (01/20 0954) BP: (119-131)/(59-78)   Temp:  [36.7 ?C (98.1 ?F)]   Pulse:  [75-93]   Respirations:  [18 PER MINUTE]   SpO2:  [100 %]             General appearance: alert and no distress  Neurologic: Grossly normal, at baseline  Lungs: Nonlabored with normal effort  Abdomen: soft, non-tender.   Extremities: extremities normal, atraumatic, no cyanosis or edema    Sedation/Medication Plan: Lidocaine  Personal history of sedation complications: Denies adverse event.   Family history of sedation complications: Denies adverse event.   Medications for Reversal: None  Discussion/Reviews:  Physician has discussed risks and alternatives of this type of sedation and above planned procedures with patient  NPO Status: Acceptable  Pregnancy Status: N/A        Lab/Radiology/Other Diagnostic Tests:  Labs:  Pertinent labs reviewed           Samson Frederic, APRN-NP  Pager [phone]

## 2019-12-09 NOTE — Other
Immediate Post Procedure Note    Date:  12/09/2019                                         Attending Physician:   Dr. Tina Griffiths  Performing Provider:  Jiles Prows, MD    Consent:  Consent obtained from patient.  Time out performed: Consent obtained, correct patient verified, correct procedure verified, correct site verified, patient marked as necessary.  Pre/Post Procedure Diagnosis:  Ascites  Indications:  Ascites    Anesthesia: Local 10 mL 2% lidocaine without epinephrine  Procedure(s):  Paracentesis  Findings:  Paracentesis     Estimated Blood Loss:  None/Negligible  Specimen(s) Removed/Disposition:  Yes, sent to pathology  Complications: None  Patient Tolerated Procedure: Well  Post-Procedure Condition:  stable    Jiles Prows, MD

## 2019-12-09 NOTE — Patient Education
Dear Ms. Funk     Thank you for choosing [facility] Interventional Radiology for your procedure. Your appointment information is listed below:    Appointment Date: 12/09/2019  Appointment Time: 3:15pm  Arrival Time:2:15pm  Location:      [place]: [address], [place], [place]  [address]  Parking: [address] Park[address] [place]  PRE-PROCEDURE INSTRUCTIONS     You are scheduled for a procedure in Interventional Radiology.  Please follow these instructions and any direction from your Primary Care/Managing Physician.  If you have questions about your procedure or need to reschedule please call [phone].    Medication Instructions:   You may take the following medications with a small sip of water:  Continue your regular medications as directed.     Diet Instructions: No dietary restrictions   Day of Exam Instructions:  1. Bathe or shower with an antibacterial soap prior to your appointment.  2. Bring a list of your current medications and the dosages.  3. Wear comfortable clothing and leave valuables at home.  4. Arrive 1 hour prior to your appointment.  This time will be spent registering, interviewing, assessing, educating and preparing you for the test.  " You will be with [place] anywhere from 30 minutes to 6 hours after your exam depending on your procedure.    Interventional Radiology Team  Perioperative and Procedural Scheduling Department  [facility]  Ph: [phone]      Cristopher Estimable BSN, RN

## 2019-12-09 NOTE — Progress Notes
Interventional Radiology Outpatient Scheduling Checklist      1.  Name of Procedure(s):   Paracentesis      2.  Date of Procedure:  12/09/2019    3.  Arrival Time:   2993      7.  Procedure Time:  1696      7.  Correct Procedural Room Assignment:  [facility] Room 8      6.  Blood Thinners Triaged and instructed per protocol: Y/N/NA:  NA  Confirmed accurate instructions sent to patient: Y/N:  NA       7.  Procedure Order Verified: Y/N:  Yes      9.  Patient instructed to have a driver: Y/N/NA:  Yes    10.  Patient instructed on NPO status: Y/N/NA:  No dietary restrictions  Confirmed accurate instructions sent to patient: Y/N:  Yes    11.  Specimen needed: Y/N/NA:  Yes   Verified Order placed: Y/N:  Yes    12.  Allergies Verified:  Y/N:  Yes    13.  Is there an Iodine Allergy: Y/N:  No  Does the Procedure Require contrast: Y/N: No  If so, was the IR- Contrast Allergy Pre-Procedure Medication protocol ordered: Y/NA:  NA    14.  Does the patient have labs according to IR Pre-procedure Laboratory Parameter policy: Y/N/NA:  Yes  If No, was the patient instructed to obtain labs prior to procedure: Y/N/NA:  NA     15.  Will the patient need to be admitted or have a possible admission: Y/N:  No  If yes, confirmed accurate instructions sent to patient: Y/N/NA:  NA     16.  Patient States Understanding: Y/N:  Yes    17.  History of OSA:  Y/N:  No  If yes, confirm request to bring CPAP sent to patient: Y/N/NA:  NA    18. Patient declines electronic procedure instructions: Y/N:  No

## 2019-12-09 NOTE — Patient Instructions
INTERVENTIONAL RADIOLOGY DISCHARGE INSTRUCTIONS  PARACENTESIS  A paracentesis is the removal of an abnormal buildup of fluid in your abdominal cavity.? This fluid buildup is called ascites and may be caused by conditions such as liver disease, heart failure, or cancer. During this procedure, a needle is inserted into your abdomen to drain the fluid.? The fluid may then be sent to the lab for testing if medically indicated.? Removal of the fluid may also relieve belly pressure and shortness of breath caused by the ascites.?  AFTER YOUR PROCEDURE:  ? You will recover in Interventional Radiology for a minimum of 30 minutes after your procedure.  ? You will be on bedrest with bathroom privileges after the procedure.?  POST-PROCEDURE PAIN:   ? Pain control following your procedure is a priority for both you and your Physicians.  ? Some soreness or tenderness at the site is to be expected for several days. We recommend taking over the counter analgesics to help relieve this pain.  ? Alternative methods for pain relief include but not limited to heat or cold compress, relaxation techniques, rest, and changing of positions.?  ? If pain continues after 5-7 days or you have severe pain not relieved by medication, please contact us as directed below.?  POST-PROCEDURE ACTIVITY:   ? A responsible adult must drive you home. ???  ? If you receive sedation, narcotic pain medication or anesthesia for the procedure, you should not drive or operate heavy machinery or do anything that requires concentration for at least 24 hours after procedure completion.  ? It is recommended that a responsible adult be with you until morning.?  POST-PROCEDURE SITE CARE:  ? You will have a small bandage over the procedure site.? Keep this dry.  ? You may remove it in 24 hours.  ? You may shower in 24 hours, after removing the bandage. ? A dry gauze bandage may be reapplied as necessary to protect your clothing as the site may sometimes leak for several days after the procedure.  ? Do not submerge the procedure site for 1 week (no bathtub, swimming, hot tub, etc.)  ? Do not use ointments, creams or powders on the puncture site.  ? Be sure your hands are clean when touching near the site.?  DIET/MEDICATIONS:   ? You may resume your previous diet after the procedure.  ? If you receive sedation or narcotic pain medications, avoid any foods or beverages containing alcohol for at least 24 hours after the procedure.  ? Please see the Medication Reconciliation sheet for instructions regarding resuming your home medications.?  CALL THE DOCTOR IF:   ? Bright red blood soaks the bandage.  ? You have pain not relieved by medication.? Some soreness at the site is to be expected.  ? You have signs of infection such as: fever greater than 101F, chills, redness, warmth, swelling, drainage or pus from the puncture site.?  For any of the above symptoms or for problems or concerns related to the procedure performed at the [facility], call?[phone] Monday-Friday from 7-5p.? After-hours and weekends, please call?[phone] and ask for the Interventional Radiology Resident on-call.  YOU OR YOUR CAREGIVER SHOULD CALL 911 FOR ANY SEVERE SYMPTOMS SUCH AS EXCESSIVE BLEEDING, SEVERE DIZZINESS, TROUBLE BREATHING OR LOSS OF CONSCIOUSNESS.  ?  ?

## 2019-12-10 ENCOUNTER — Encounter: Admit: 2019-12-10 | Discharge: 2019-12-10 | Payer: MEDICARE

## 2019-12-10 DIAGNOSIS — R945 Abnormal results of liver function studies: Secondary | ICD-10-CM

## 2019-12-10 DIAGNOSIS — K31819 Angiodysplasia of stomach and duodenum without bleeding: Secondary | ICD-10-CM

## 2019-12-10 LAB — COMPREHENSIVE METABOLIC PANEL
Lab: 106
Lab: 114 — ABNORMAL HIGH (ref 70–105)
Lab: 25
Lab: 46 — AB (ref >59)
Lab: 6.6
Lab: 1.1 mg/dL
Lab: 1.2 mg/dL — ABNORMAL HIGH (ref 0.57–1.11)
Lab: 11
Lab: 137 mmol/L — ABNORMAL LOW (ref 2.0–3.0)
Lab: 175 U/L — ABNORMAL HIGH (ref 40–150)
Lab: 2.7 g/dL — ABNORMAL LOW (ref 3.4–4.8)
Lab: 24 mmol/L
Lab: 27 mg/dL — ABNORMAL HIGH (ref 9.8–20.1)
Lab: 35 U/L — ABNORMAL HIGH (ref 5–34)
Lab: 4.1 mmol/L
Lab: 8.3 mg/dL — ABNORMAL LOW (ref 8.4–10.2)

## 2019-12-10 LAB — PROTIME INR (PT): Lab: 14 s — ABNORMAL HIGH (ref 9.9–12.6)

## 2019-12-11 ENCOUNTER — Encounter: Admit: 2019-12-11 | Discharge: 2019-12-11 | Payer: MEDICARE

## 2019-12-11 DIAGNOSIS — K7460 Unspecified cirrhosis of liver: Secondary | ICD-10-CM

## 2019-12-11 MED ORDER — RIFAXIMIN 550 MG PO TAB
550 mg | ORAL_TABLET | Freq: Two times a day (BID) | ORAL | 3 refills | 30.00000 days | Status: AC
Start: 2019-12-11 — End: ?

## 2019-12-11 NOTE — Telephone Encounter
Pt requesting call back regarding orders and other questions.

## 2019-12-11 NOTE — Telephone Encounter
call returned, lab results reviewed with patient. MELD 12. hbg 7.7, continues to trend down. Paitent advised labs will be reviewed next week, and will await those results prior to placing order and scheduling. She v/u.   Patient reports almost being out of Xifaxan. last documented note from 83/66/29 reports application pending.   RN spoke to [facility], application approved on 1/21 and medication will be arrive within 5-7 business days. New Rx sent to [facility].  All questions answered, patient appreciative of call.   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 Plan directed by Dr. Lovena Le.  Patient notified of this information. Orders placed and staff message sent to MA for scheduling.

## 2019-12-15 ENCOUNTER — Encounter: Admit: 2019-12-15 | Discharge: 2019-12-15 | Payer: MEDICARE

## 2019-12-15 DIAGNOSIS — K7460 Unspecified cirrhosis of liver: Secondary | ICD-10-CM

## 2019-12-15 DIAGNOSIS — K31819 Angiodysplasia of stomach and duodenum without bleeding: Secondary | ICD-10-CM

## 2019-12-15 MED ORDER — METHYLPREDNISOLONE 32 MG PO TAB
32 mg | ORAL_TABLET | ORAL | 0 refills | Status: DC
Start: 2019-12-15 — End: 2020-01-08

## 2019-12-15 MED ORDER — FERRIC CARBOXYMALTOSE IVPB
750 mg | Freq: Once | INTRAVENOUS | 0 refills | Status: CN
Start: 2019-12-15 — End: ?

## 2019-12-16 ENCOUNTER — Encounter: Admit: 2019-12-16 | Discharge: 2019-12-16 | Payer: MEDICARE

## 2019-12-16 DIAGNOSIS — R945 Abnormal results of liver function studies: Secondary | ICD-10-CM

## 2019-12-16 DIAGNOSIS — K31819 Angiodysplasia of stomach and duodenum without bleeding: Secondary | ICD-10-CM

## 2019-12-16 LAB — COMPREHENSIVE METABOLIC PANEL
Lab: 36 — ABNORMAL HIGH (ref 5–34)
Lab: 1 mg/dL (ref 0.2–1.2)
Lab: 1.1 mg/dL — ABNORMAL HIGH (ref 0.57–1.11)
Lab: 12 meq/L — ABNORMAL LOW (ref 0–14)
Lab: 136 mmol/L — ABNORMAL LOW (ref 136–145)
Lab: 152 mg/dL — ABNORMAL HIGH (ref 70–105)
Lab: 185 U/L — ABNORMAL HIGH (ref 40–150)
Lab: 2.6 g/dL — ABNORMAL LOW (ref 3.4–4.8)
Lab: 23 mmol/L — ABNORMAL LOW (ref 23–31)
Lab: 29 % (ref 0.0–2.0)
Lab: 30 mg/dL — ABNORMAL HIGH (ref 9.8–20.1)
Lab: 50 uL — ABNORMAL LOW (ref >59)
Lab: 6.6 g/dL (ref 6.2–8.1)
Lab: 8.5 mg/dL — ABNORMAL LOW (ref 8.4–10.2)

## 2019-12-16 LAB — PROTIME INR (PT)
Lab: 1.3 mmol/L — ABNORMAL LOW (ref 98–107)
Lab: 14 s — ABNORMAL HIGH (ref 9.9–12.6)

## 2019-12-16 LAB — CBC AND DIFF
Lab: 8.2
Lab: 0.1 /uL (ref 0.0–0.2)
Lab: 0.1 /uL (ref 0.0–0.6)

## 2019-12-21 NOTE — Progress Notes
Interventional Radiology Outpatient Scheduling Checklist      1.  Name of Procedure(s):   Paracentesis      2.  Date of Procedure:   12/22/19      3.  Arrival Time:   1300      4.  Procedure Time: 9741       6.  Correct Procedural Room Assignment:  IR BH rm 7      6.  Blood Thinners Triaged and instructed per protocol: Y/N/NA:  NA  Confirmed accurate instructions sent to patient: Y/N:  NA       7.  Procedure Order Verified: Y/N:  Yes      9.  Patient instructed to have a driver: Y/N/NA:  Yes    10.  Patient instructed on NPO status: Y/N/NA:  No dietary restrictions  Confirmed accurate instructions sent to patient: Y/N:  Yes    11.  Specimen needed: Y/N/NA:  Yes   Verified Order placed: Y/N:  Yes    12.  Allergies Verified:  Y/N:  Yes    13.  Is there an Iodine Allergy: Y/N:  No  Does the Procedure Require contrast: Y/N:  No  If so, was the IR- Contrast Allergy Pre-Procedure Medication protocol ordered: Y/NA:  NA    14.  Does the patient have labs according to IR Pre-procedure Laboratory Parameter policy: Y/N/NA:  Yes  If No, was the patient instructed to obtain labs prior to procedure: Y/N/NA:  NA     15.  Will the patient need to be admitted or have a possible admission: Y/N:  No  If yes, confirmed accurate instructions sent to patient: Y/N/NA:  NA     16.  Patient States Understanding: Y/N:  Yes    17.  History of OSA:  Y/N:  No  If yes, confirm request to bring CPAP sent to patient: Y/N/NA:  NA    18. Patient declines electronic procedure instructions: Y/N:  No

## 2019-12-22 ENCOUNTER — Ambulatory Visit: Admit: 2019-12-22 | Discharge: 2019-12-22 | Payer: MEDICARE

## 2019-12-22 ENCOUNTER — Encounter: Admit: 2019-12-22 | Discharge: 2019-12-22 | Payer: MEDICARE

## 2019-12-22 DIAGNOSIS — Z01810 Encounter for preprocedural cardiovascular examination: Secondary | ICD-10-CM

## 2019-12-22 DIAGNOSIS — J329 Chronic sinusitis, unspecified: Secondary | ICD-10-CM

## 2019-12-22 DIAGNOSIS — K760 Fatty (change of) liver, not elsewhere classified: Secondary | ICD-10-CM

## 2019-12-22 DIAGNOSIS — E669 Obesity, unspecified: Secondary | ICD-10-CM

## 2019-12-22 DIAGNOSIS — K766 Portal hypertension: Secondary | ICD-10-CM

## 2019-12-22 DIAGNOSIS — R0789 Other chest pain: Secondary | ICD-10-CM

## 2019-12-22 DIAGNOSIS — I729 Aneurysm of unspecified site: Secondary | ICD-10-CM

## 2019-12-22 DIAGNOSIS — E119 Type 2 diabetes mellitus without complications: Secondary | ICD-10-CM

## 2019-12-22 DIAGNOSIS — K7469 Other cirrhosis of liver: Secondary | ICD-10-CM

## 2019-12-22 DIAGNOSIS — K31819 Angiodysplasia of stomach and duodenum without bleeding: Secondary | ICD-10-CM

## 2019-12-22 DIAGNOSIS — K5730 Diverticulosis of large intestine without perforation or abscess without bleeding: Secondary | ICD-10-CM

## 2019-12-22 DIAGNOSIS — K7460 Unspecified cirrhosis of liver: Secondary | ICD-10-CM

## 2019-12-22 DIAGNOSIS — I10 Essential (primary) hypertension: Secondary | ICD-10-CM

## 2019-12-22 DIAGNOSIS — D500 Iron deficiency anemia secondary to blood loss (chronic): Secondary | ICD-10-CM

## 2019-12-22 DIAGNOSIS — R0600 Dyspnea, unspecified: Secondary | ICD-10-CM

## 2019-12-22 MED ORDER — ALBUMIN, HUMAN 25 % IV SOLP
0 refills | Status: CP
Start: 2019-12-22 — End: ?
  Administered 2019-12-22: 20:00:00 37.5 g via INTRAVENOUS

## 2019-12-22 NOTE — H&P (View-Only)
IR Pre-Procedure History and Physical/Sedation Plan    Procedure Date: 12/22/2019     Planned Procedure(s):  Ultrasound-guided paracentesis     Indication:  Fluid testing; Therapeutic drainage  __________________________________________________________________    Chief Complaint:  Ascites    History of Present Illness: Kristin Moody is a 64 y.o. female with a history as listed below who presents today for procedure.    Patient Active Problem List    Diagnosis Date Noted   ? 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 Anemia 09/28/2019   ? Encephalopathy 08/12/2019   ? Spasticity 08/10/2019   ? Hyperreflexia 08/10/2019   ? 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 Right leg weakness 05/14/2019   ? 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 Iron (Fe) deficiency anemia 01/19/2019   ? Preop cardiovascular exam 01/07/2019   ? Pre-transplant evaluation for liver transplant 01/06/2019   ? Cirrhosis (HCC) 12/16/2018   ? Acute on chronic anemia 12/16/2018   ? Lactic acidosis 12/16/2018   ? GI bleed 10/24/2018   ? Portal hypertension (HCC) 08/28/2018   ? Confusion 08/27/2018   ? Dyspnea 08/27/2018   ? Chronic abdominal pain 08/27/2018   ? 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 Acute on chronic blood loss anemia 08/27/2018   ? Iron deficiency anemia 04/13/2018   ? Pneumonia due to infectious organism 04/13/2018   ? Melena 04/10/2018   ? Esophageal varices without bleeding (HCC) 02/25/2018   ? Cirrhosis of liver without ascites (HCC) 02/25/2018   ? 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 Lower abdominal pain 10/24/2014   ? Chest discomfort 09/21/2014   ? Essential hypertension 08/13/2012   ? Abnormal liver function tests 05/13/2011   ? Fatty liver disease, nonalcoholic 05/13/2011   ? Obesity (BMI 30-39.9) 05/13/2011   ? Slow transit constipation 05/13/2011     Medical History:   Diagnosis Date   ? Aneurysm (HCC)    ? 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 Diverticulosis of colon     descending and sigmoid colon   ? Dyspnea    ? Essential hypertension 08/13/2012   ? Fatty infiltration of liver    ? HTN (hypertension)    ? Obesity (BMI 30-39.9) 05/13/2011   ? Preop cardiovascular exam 01/07/2019   ? Sinus infection    ? Type 2 diabetes mellitus (HCC) 09/21/2014      Surgical History:   Procedure Laterality Date   ? HYSTERECTOMY  1994   ? UPPER GASTROINTESTINAL ENDOSCOPY  2009   ? COLONOSCOPY  2009   ? CYSTOCELE REPAIR  2009    with endocele repair   ? RECTOCELE REPAIR  03/2009   ? LIVER BIOPSY  07/17/2010   ? COLONOSCOPY N/A 11/08/2017    Performed by Dawna Part, MD at [facility] ENDO   ? ESOPHAGOGASTRODUODENOSCOPY N/A 11/08/2017    Performed by Dawna Part, MD at [facility] ENDO   ? ESOPHAGOGASTRODUODENOSCOPY WITH BIOPSY - FLEXIBLE  11/08/2017    Performed by Dawna Part, MD at [facility] ENDO   ? COLONOSCOPY WITH HOT BIOPSY FORCEPS REMOVAL TUMOR/ POLYP/ OTHER LESION  11/08/2017    Performed by Dawna Part, MD at [facility] ENDO   ? ESOPHAGOGASTRODUODENOSCOPY WITH BAND LIGATION ESOPHAGEAL/ GASTRIC VARICES - FLEXIBLE N/A 04/10/2018    Performed by Normajean Baxter, MD at [facility] ENDO   ? ESOPHAGOGASTRODUODENOSCOPY WITH BIOPSY - FLEXIBLE N/A 04/10/2018    Performed by  Normajean Baxter, MD at [facility] ENDO   ? ESOPHAGOGASTRODUODENOSCOPY WITH CONTROL OF BLEEDING - FLEXIBLE N/A 04/25/2018    Performed by Jolee Ewing, MD at [facility] ENDO   ? ESOPHAGOGASTRODUODENOSCOPY WITH BIOPSY - FLEXIBLE with push enteroscopy N/A 08/28/2018    Performed by Celesta Gentile, MD at [facility] ENDO   ? EGD N/A 10/27/2018    Performed by Eliott Nine, MD at [facility] ENDO   ? ESOPHAGOGASTRODUODENOSCOPY WITH SNARE REMOVAL TUMOR/ POLYP/ OTHER LESION - FLEXIBLE N/A 10/27/2018    Performed by Eliott Nine, MD at [facility] ENDO   ? ESOPHAGOGASTRODUODENOSCOPY WITH BIOPSY - FLEXIBLE N/A 12/16/2018    Performed by Buckles, Vinnie Level, MD at [facility] ENDO ? ESOPHAGOGASTRODUODENOSCOPY WITH CONTROL OF BLEEDING - FLEXIBLE N/A 12/16/2018    Performed by Buckles, Vinnie Level, MD at [facility] ENDO   ? ESOPHAGOGASTRODUODENOSCOPY WITH SPECIMEN COLLECTION BY BRUSHING/ WASHING N/A 01/22/2019    Performed by Veneta Penton, MD at [facility] ENDO   ? ESOPHAGOGASTRODUODENOSCOPY WITH DILATION ESOPHAGUS WITH BALLOON 30 MM OR GREATER - FLEXIBLE N/A 01/22/2019    Performed by Veneta Penton, MD at [facility] ENDO   ? ESOPHAGOGASTRODUODENOSCOPY WITH BIOPSY - FLEXIBLE N/A 01/22/2019    Performed by Veneta Penton, MD at [facility] ENDO   ? ESOPHAGOGASTRODUODENOSCOPY WITH BIOPSY - FLEXIBLE N/A 06/19/2019    Performed by Dawna Part, MD at [facility] ENDO   ? ESOPHAGOGASTRODUODENOSCOPY [WITH APC]  WITH SPECIMEN COLLECTION BY BRUSHING/ WASHING N/A 08/07/2019    Performed by Dawna Part, MD at [facility] ENDO   ? ESOPHAGOGASTRODUODENOSCOPY WITH SPECIMEN COLLECTION BY BRUSHING/ WASHING N/A 09/10/2019    Performed by Buckles, Vinnie Level, MD at [facility] ENDO   ? ESOPHAGOGASTRODUODENOSCOPY WITH CONTROL OF BLEEDING - FLEXIBLE N/A 09/10/2019    Performed by Buckles, Vinnie Level, MD at [facility] ENDO   ? CHOLECYSTECTOMY     ? COLONOSCOPY     ? ESOPHAGOGASTRIC FUNDOPLICATION  2003, 2004    laparoscopic   ? ESOPHAGOGASTRIC FUNDOPLICATION     ? HX HYSTERECTOMY     ? PR ESOPHAGOSCOPY FLEXIBLE TRANSORAL DIAGNOSTIC        Social History     Tobacco Use   ? Smoking status: Never Smoker   ? Smokeless tobacco: Never Used   Substance Use Topics   ? Alcohol use: No      Family History   Problem Relation Age of Onset   ? Other Mother    ? Kidney Failure Mother    ? Osteoporosis Mother    ? Cancer Father         esophageal   ? Liver Disease Sister         endstage, s/p liver transplant   ? Cancer Brother         tonsil, liver    ? Hip Fracture Neg Hx       Medications Prior to Admission   Medication Sig Dispense Refill Last Dose   ? atorvastatin (LIPITOR) 10 mg tablet Take one tablet by mouth daily. 90 tablet 3 ? cetirizine (ZYRTEC) 10 mg tablet Take 10 mg by mouth daily as needed for Allergy symptoms.      ? duloxetine DR (CYMBALTA) 60 mg capsule Take 60 mg by mouth daily.      ? furosemide (LASIX) 20 mg tablet TAKE 3 TABLETS BY MOUTH EVERY MORNING (Patient taking differently: Patient takes 4 tablets every morning to equal 80mg  daily) 270 tablet 1    ? methylprednisolone (MEDROL) 32 mg tablet Take  one tablet by mouth as directed. Take 32mg  by mouth 12 hours before appointment, then take 32mg  by mouth 2 hours before appointment time 2 tablet 0    ? pantoprazole DR (PROTONIX) 40 mg tablet TAKE 1 TABLET BY MOUTH TWICE A DAY 180 tablet 1    ? polyethylene glycol 3350 (MIRALAX) 17 g packet Take one packet by mouth twice daily.      ? promethazine (PHENERGAN) 25 mg tablet Take 25 mg by mouth every 4 hours as needed. Just uses it with Tramadol      ? rifAXIMin (XIFAXAN) 550 mg tablet Take one tablet by mouth twice daily. 180 tablet 3    ? spironolactone (ALDACTONE) 50 mg tablet Take two tablets by mouth daily. Take with food.  Indications: ascites, accumulation of fluid caused by cirrhosis of the liver 180 tablet 1    ? traMADoL (ULTRAM) 50 mg tablet Take 1 tablet by mouth as Needed.      ? zinc sulfate 220 mg (50 mg elemental zinc) capsule Take one capsule by mouth daily. 90 capsule 3      Allergies   Allergen Reactions   ? Adhesive Tape (Rosins) RASH   ? Contrast Dye Iv, Iodine Containing [Iodinated Contrast Media] RASH   ? Sulfa (Sulfonamide Antibiotics) RASH   ? Codeine NAUSEA AND VOMITING   ? Morphine SEE COMMENTS     Pt reports burns her veins   ? Penicillin G SEE COMMENTS     Throat issues, blisters in throat    ? Tramadol NAUSEA ONLY and ITCHING     Takes this medication regularly        Review of Systems  A comprehensive review of systems was negative.    Physical Exam:  Vital Signs: Last Filed In 24 Hours Vital Signs: 24 Hour Range   BP: 110/72 (02/02 1003)  Pulse: 88 (02/02 0935)  SpO2: 99 % (02/02 0935) Height: 160 cm (63) (02/02 0935) BP: (102-110)/(72-76)   Pulse:  [88]   SpO2:  [99 %]           General appearance: alert and no distress noted.  Neurologic: Grossly normal.  Lungs: Non labored.  Heart: regular rate and rhythm  Abdomen: distended    Pre-procedure anxiolysis plan: N/A  Sedation/Medication Plan: Local anesthetic  Personal history of sedation complications: Denies adverse event.   Family history of sedation complications: Denies adverse event.   Medications for Reversal: NA  Discussion/Reviews:  Physician has discussed risks and alternatives of this type of sedation and above planned procedures with patient    NPO Status: NA  Airway:  NA  Head and Neck: NA  Mouth: NA   Anesthesia Classification:  ASA III (A patient with a severe systemic disease that limits activity, but is not incapacitating)  Pregnancy Status: Not Pregnant    Lab/Radiology/Other Diagnostic Tests:  Labs:  Pertinent labs reviewed           Zahlia Deshazer P Divine-Thiele, APRN-NP  Pager [phone]

## 2019-12-22 NOTE — Patient Instructions
INTERVENTIONAL RADIOLOGY DISCHARGE INSTRUCTIONS  PARACENTESIS  A paracentesis is the removal of an abnormal buildup of fluid in your abdominal cavity.? This fluid buildup is called ascites and may be caused by conditions such as liver disease, heart failure, or cancer. During this procedure, a needle is inserted into your abdomen to drain the fluid.? The fluid may then be sent to the lab for testing if medically indicated.? Removal of the fluid may also relieve belly pressure and shortness of breath caused by the ascites.?  AFTER YOUR PROCEDURE:  ? You will recover in Interventional Radiology for a minimum of 30 minutes after your procedure.  ? You will be on bedrest with bathroom privileges after the procedure.?  POST-PROCEDURE PAIN:   ? Pain control following your procedure is a priority for both you and your Physicians.  ? Some soreness or tenderness at the site is to be expected for several days. We recommend taking over the counter analgesics to help relieve this pain.  ? Alternative methods for pain relief include but not limited to heat or cold compress, relaxation techniques, rest, and changing of positions.?  ? If pain continues after 5-7 days or you have severe pain not relieved by medication, please contact us as directed below.?  POST-PROCEDURE ACTIVITY:   ? A responsible adult must drive you home. ???  ? If you receive sedation, narcotic pain medication or anesthesia for the procedure, you should not drive or operate heavy machinery or do anything that requires concentration for at least 24 hours after procedure completion.  ? It is recommended that a responsible adult be with you until morning.?  POST-PROCEDURE SITE CARE:  ? You will have a small bandage over the procedure site.? Keep this dry.  ? You may remove it in 24 hours.  ? You may shower in 24 hours, after removing the bandage. ? A dry gauze bandage may be reapplied as necessary to protect your clothing as the site may sometimes leak for several days after the procedure.  ? Do not submerge the procedure site for 1 week (no bathtub, swimming, hot tub, etc.)  ? Do not use ointments, creams or powders on the puncture site.  ? Be sure your hands are clean when touching near the site.?  DIET/MEDICATIONS:   ? You may resume your previous diet after the procedure.  ? If you receive sedation or narcotic pain medications, avoid any foods or beverages containing alcohol for at least 24 hours after the procedure.  ? Please see the Medication Reconciliation sheet for instructions regarding resuming your home medications.?  CALL THE DOCTOR IF:   ? Bright red blood soaks the bandage.  ? You have pain not relieved by medication.? Some soreness at the site is to be expected.  ? You have signs of infection such as: fever greater than 101F, chills, redness, warmth, swelling, drainage or pus from the puncture site.?  For any of the above symptoms or for problems or concerns related to the procedure performed at the [facility], call?[phone] Monday-Friday from 7-5p.? After-hours and weekends, please call?[phone] and ask for the Interventional Radiology Resident on-call.  YOU OR YOUR CAREGIVER SHOULD CALL 911 FOR ANY SEVERE SYMPTOMS SUCH AS EXCESSIVE BLEEDING, SEVERE DIZZINESS, TROUBLE BREATHING OR LOSS OF CONSCIOUSNESS.  ?  ?

## 2019-12-22 NOTE — Progress Notes
Date of Service: 12/22/2019    Kristin Moody is a 64 y.o. female.       HPI     Kristin Moody was in the [place] office today with her husband.  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  Kristin Moody says that she isn't quite to a MELD score to consider transplant, but they are under the impression that at some point this will be a consideration.  ?  I have seen her a couple of times in the past for some chest discomfort and risk factors for vascular disease.  At this point we have not identified any significant obstructive coronary disease.  She underwent coronary arteriography in 2015 and at that time her coronaries were normal.  She had an echocardiogram in 2019 and at that time left ventricular function remained normal.  We talked about the fact that she will need cardiac re-evaluation at the point liver transplantation becomes a possibility.  ?  She denies any problems with palpitations, syncope, or near syncope.  Her main complaint is chronic fatigue.  ?  Her BP is on the low side, but the only medication now that may lower BP is her spironolactone.  She denies light headedness or syncope.  ?  Kristin Moody is not having any chest discomfort or breathlessness, no symptoms that would suggest the development of significant coronary disease       Vitals:    12/22/19 0935 12/22/19 1003   BP: 102/76 110/72   BP Source: Arm, Left Upper Arm, Right Upper   Patient Position: Sitting Sitting   Pulse: 88    SpO2: 99%    Weight: 75.9 kg (167 lb 6.4 oz)    Height: 1.6 m (5' 3)    PainSc: Zero      Body mass index is 29.65 kg/m?Marland Kitchen     Past Medical History  Patient Active Problem List    Diagnosis Date Noted   ? 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 Anemia 09/28/2019   ? Encephalopathy 08/12/2019   ? Spasticity 08/10/2019   ? Hyperreflexia 08/10/2019   ? 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 Right leg weakness 05/14/2019   ? 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 Iron (Fe) deficiency anemia 01/19/2019   ? Preop cardiovascular exam 01/07/2019   ? Pre-transplant evaluation for liver transplant 01/06/2019   ? Cirrhosis (HCC) 12/16/2018   ? Acute on chronic anemia 12/16/2018   ? Lactic acidosis 12/16/2018   ? GI bleed 10/24/2018   ? Portal hypertension (HCC) 08/28/2018   ? Confusion 08/27/2018   ? Dyspnea 08/27/2018   ? Chronic abdominal pain 08/27/2018   ? 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 Acute on chronic blood loss anemia 08/27/2018   ? Iron deficiency anemia 04/13/2018   ? Pneumonia due to infectious organism 04/13/2018   ? Melena 04/10/2018   ? Esophageal varices without bleeding (HCC) 02/25/2018   ? Cirrhosis of liver without ascites (HCC) 02/25/2018   ? 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 Lower abdominal pain 10/24/2014   ? Chest discomfort 09/21/2014   ? Essential hypertension 08/13/2012   ? Abnormal liver function tests 05/13/2011   ? Fatty liver disease, nonalcoholic 05/13/2011   ? Obesity (BMI 30-39.9) 05/13/2011   ? Slow transit constipation 05/13/2011         Review of Systems   Constitution: Positive for malaise/fatigue and weight gain.   HENT: Negative.  Eyes: Negative.    Cardiovascular: Positive for dyspnea on exertion and leg swelling.   Respiratory: Negative.    Endocrine: Negative.    Hematologic/Lymphatic: Negative.    Skin: Negative.    Musculoskeletal: Positive for muscle weakness.   Gastrointestinal: Positive for bloating.   Genitourinary: Negative.    Neurological: Positive for weakness.   Psychiatric/Behavioral: Negative.    Allergic/Immunologic: Negative.        Physical Exam    General Appearance: no distress   Skin: warm, no ulcers or xanthomas   Digits and Nails: no cyanosis or clubbing   Eyes: conjunctivae and lids normal, pupils are equal and round   Teeth/Gums/Palate: dentition unremarkable, no lesions   Lips & Oral Mucosa: no pallor or cyanosis   Neck Veins: normal JVP , neck veins are not distended Thyroid: no nodules, masses, tenderness or enlargement   Chest Inspection: chest is normal in appearance   Respiratory Effort: breathing comfortably, no respiratory distress   Auscultation/Percussion: lungs clear to auscultation, no rales or rhonchi, no wheezing   PMI: PMI not enlarged or displaced   Cardiac Rhythm: regular rhythm and normal rate   Cardiac Auscultation: S1, S2 normal, no rub, no gallop   Murmurs: no murmur   Peripheral Circulation: normal peripheral circulation   Carotid Arteries: normal carotid upstroke bilaterally, no bruits   Radial Arteries: normal symmetric radial pulses   Abdominal Aorta: no abdominal aortic bruit   Pedal Pulses: normal symmetric pedal pulses   Lower Extremity Edema: 2+ bilateral lower extremity edema   Abdominal Exam: soft, non-tender, no masses, bowel sounds normal, ascites   Liver & Spleen: no organomegaly   Gait & Station: walks without assistance   Muscle Strength: normal muscle tone   Orientation: oriented to time, place and person   Affect & Mood: appropriate and sustained affect   Language and Memory: patient responsive and seems to comprehend information   Neurologic Exam: neurological assessment grossly intact   Other: moves all extremities      Problems Addressed Today  Encounter Diagnoses   Name Primary?   ? Essential hypertension    ? Portal hypertension (HCC)    ? Chest discomfort        Assessment and Plan       Essential hypertension  BP is OK, no light headedness.    Portal hypertension (HCC)  She's being closely managed by the Hepatology program at [facility].    Chest discomfort  She's had no evidence of structural heart disease with evaluations in the past.      Current Medications (including today's revisions)  ? atorvastatin (LIPITOR) 10 mg tablet Take one tablet by mouth daily.   ? cetirizine (ZYRTEC) 10 mg tablet Take 10 mg by mouth daily as needed for Allergy symptoms.   ? duloxetine DR (CYMBALTA) 60 mg capsule Take 60 mg by mouth daily. ? furosemide (LASIX) 20 mg tablet TAKE 3 TABLETS BY MOUTH EVERY MORNING (Patient taking differently: Patient takes 4 tablets every morning to equal 80mg  daily)   ? methylprednisolone (MEDROL) 32 mg tablet Take one tablet by mouth as directed. Take 32mg  by mouth 12 hours before appointment, then take 32mg  by mouth 2 hours before appointment time   ? pantoprazole DR (PROTONIX) 40 mg tablet TAKE 1 TABLET BY MOUTH TWICE A DAY   ? polyethylene glycol 3350 (MIRALAX) 17 g packet Take one packet by mouth twice daily.   ? promethazine (PHENERGAN) 25 mg tablet Take 25 mg by mouth every 4  hours as needed. Just uses it with Tramadol   ? rifAXIMin (XIFAXAN) 550 mg tablet Take one tablet by mouth twice daily.   ? spironolactone (ALDACTONE) 50 mg tablet Take two tablets by mouth daily. Take with food.  Indications: ascites, accumulation of fluid caused by cirrhosis of the liver   ? traMADoL (ULTRAM) 50 mg tablet Take 1 tablet by mouth as Needed.   ? zinc sulfate 220 mg (50 mg elemental zinc) capsule Take one capsule by mouth daily.     Total time spent on today's office visit was 30 minutes.  This includes face-to-face in person visit with patient as well as nonface-to-face time including review of the EMR, outside records, labs, radiologic studies, echocardiogram & other cardiovascular studies, formation of treatment plan, after visit summary, future disposition, and lastly on documentation.

## 2019-12-22 NOTE — Other
Immediate Post Procedure Note    Date:  12/22/2019                                         Attending Physician:   Dr. Mauricia Area  Performing Provider:  Jiles Prows, MD    Consent:  Consent obtained from patient.  Time out performed: Consent obtained, correct patient verified, correct procedure verified, correct site verified, patient marked as necessary.  Pre/Post Procedure Diagnosis:  Ascites  Indications:  Ascites    Anesthesia: Local 10 mL 2% lidocaine without epinephrine  Procedure(s): Paracentesis  Findings:  Straw colored ascites     Estimated Blood Loss:  None/Negligible  Specimen(s) Removed/Disposition:  Yes, sent to pathology  Complications: None  Patient Tolerated Procedure: Well  Post-Procedure Condition:  stable    Jiles Prows, MD

## 2019-12-22 NOTE — Assessment & Plan Note
She's being closely managed by the Hepatology program at [facility].

## 2019-12-22 NOTE — Assessment & Plan Note
BP is OK, no light headedness.

## 2019-12-22 NOTE — Assessment & Plan Note
She's had no evidence of structural heart disease with evaluations in the past.

## 2019-12-23 LAB — GRAM STAIN

## 2019-12-24 ENCOUNTER — Encounter: Admit: 2019-12-24 | Discharge: 2019-12-24 | Payer: MEDICARE

## 2019-12-24 DIAGNOSIS — R945 Abnormal results of liver function studies: Secondary | ICD-10-CM

## 2019-12-24 DIAGNOSIS — K31819 Angiodysplasia of stomach and duodenum without bleeding: Secondary | ICD-10-CM

## 2019-12-24 LAB — COMPREHENSIVE METABOLIC PANEL
Lab: 1 mg/dL
Lab: 1.4 mg/dL — ABNORMAL HIGH (ref 0.57–1.11)
Lab: 101 mmol/L — ABNORMAL LOW (ref 80.0–99.0)
Lab: 13 meq/L
Lab: 135 mmol/L — ABNORMAL LOW (ref 136–145)
Lab: 179 mg/dL — ABNORMAL HIGH (ref 70–105)
Lab: 2.4 g/dL — ABNORMAL LOW (ref 3.4–4.8)
Lab: 203 U/L — ABNORMAL HIGH (ref 40–150)
Lab: 25 mmol/L — ABNORMAL LOW (ref 27.0–31.0)
Lab: 28 U/L
Lab: 33 mg/dL — ABNORMAL HIGH (ref 9.8–20.1)
Lab: 34 U/L
Lab: 38 — ABNORMAL LOW (ref >59)
Lab: 4.1 mmol/L — ABNORMAL LOW (ref 37.0–47.0)
Lab: 6.3 g/dL
Lab: 8.1 mg/dL — ABNORMAL LOW (ref 8.4–10.2)

## 2019-12-24 LAB — PROTIME INR (PT): Lab: 15 s — ABNORMAL HIGH (ref 9.9–12.6)

## 2019-12-24 LAB — CBC AND DIFF
Lab: 0.1
Lab: 7.4

## 2019-12-28 ENCOUNTER — Encounter: Admit: 2019-12-28 | Discharge: 2019-12-28 | Payer: MEDICARE

## 2019-12-28 DIAGNOSIS — R188 Other ascites: Secondary | ICD-10-CM

## 2019-12-28 DIAGNOSIS — R601 Generalized edema: Secondary | ICD-10-CM

## 2019-12-28 MED ORDER — ACETAMINOPHEN 500 MG PO TAB
500 mg | Freq: Once | ORAL | 0 refills | Status: CN
Start: 2019-12-28 — End: ?

## 2019-12-28 MED ORDER — IRON DEXTRAN IVPB
975 mg | Freq: Once | INTRAVENOUS | 0 refills | Status: CN
Start: 2019-12-28 — End: ?

## 2019-12-28 MED ORDER — SPIRONOLACTONE 50 MG PO TAB
50 mg | ORAL_TABLET | Freq: Every day | ORAL | 1 refills | 46.00000 days | Status: DC
Start: 2019-12-28 — End: 2020-01-08

## 2019-12-28 MED ORDER — FUROSEMIDE 20 MG PO TAB
40 mg | ORAL_TABLET | Freq: Every morning | ORAL | 1 refills | 90.00000 days | Status: DC
Start: 2019-12-28 — End: 2020-01-08

## 2019-12-28 MED ORDER — IRON DEXTRAN IVPB
25 mg | Freq: Once | INTRAVENOUS | 0 refills | Status: CN
Start: 2019-12-28 — End: ?

## 2019-12-28 MED ORDER — DIPHENHYDRAMINE HCL 25 MG PO CAP
25 mg | Freq: Once | ORAL | 0 refills | Status: CN
Start: 2019-12-28 — End: ?

## 2019-12-28 NOTE — Telephone Encounter
Spoke to patient and scheduled OV w/ RT on 01/06/20 @ 8am.

## 2019-12-30 ENCOUNTER — Encounter: Admit: 2019-12-30 | Discharge: 2019-12-30 | Payer: MEDICARE

## 2019-12-30 DIAGNOSIS — D649 Anemia, unspecified: Secondary | ICD-10-CM

## 2019-12-30 NOTE — Progress Notes
patient had labs completed 2/9 @ [facility] in Medical records to send results to our facility STAT for review to determine need for blood transfusion.  Kristin Moody sending now.

## 2019-12-31 ENCOUNTER — Encounter: Admit: 2019-12-31 | Discharge: 2019-12-31 | Payer: MEDICARE

## 2019-12-31 ENCOUNTER — Ambulatory Visit: Admit: 2019-12-31 | Discharge: 2020-01-01 | Payer: MEDICARE

## 2019-12-31 DIAGNOSIS — E669 Obesity, unspecified: Secondary | ICD-10-CM

## 2019-12-31 DIAGNOSIS — K31819 Angiodysplasia of stomach and duodenum without bleeding: Secondary | ICD-10-CM

## 2019-12-31 DIAGNOSIS — D649 Anemia, unspecified: Secondary | ICD-10-CM

## 2019-12-31 DIAGNOSIS — I10 Essential (primary) hypertension: Secondary | ICD-10-CM

## 2019-12-31 DIAGNOSIS — J329 Chronic sinusitis, unspecified: Secondary | ICD-10-CM

## 2019-12-31 DIAGNOSIS — R0600 Dyspnea, unspecified: Secondary | ICD-10-CM

## 2019-12-31 DIAGNOSIS — D500 Iron deficiency anemia secondary to blood loss (chronic): Principal | ICD-10-CM

## 2019-12-31 DIAGNOSIS — K7460 Unspecified cirrhosis of liver: Secondary | ICD-10-CM

## 2019-12-31 DIAGNOSIS — E119 Type 2 diabetes mellitus without complications: Secondary | ICD-10-CM

## 2019-12-31 DIAGNOSIS — K760 Fatty (change of) liver, not elsewhere classified: Secondary | ICD-10-CM

## 2019-12-31 DIAGNOSIS — K5730 Diverticulosis of large intestine without perforation or abscess without bleeding: Secondary | ICD-10-CM

## 2019-12-31 DIAGNOSIS — Z01810 Encounter for preprocedural cardiovascular examination: Secondary | ICD-10-CM

## 2019-12-31 DIAGNOSIS — R945 Abnormal results of liver function studies: Secondary | ICD-10-CM

## 2019-12-31 DIAGNOSIS — I729 Aneurysm of unspecified site: Secondary | ICD-10-CM

## 2019-12-31 LAB — COMPREHENSIVE METABOLIC PANEL
Lab: 1.1 mg/dL
Lab: 1.4 mg/dL — ABNORMAL HIGH (ref 0.57–1.11)
Lab: 100 mmol/L — ABNORMAL LOW (ref 27.0–31.0)
Lab: 13 meq/L — ABNORMAL HIGH (ref 11.5–14.5)
Lab: 133 mmol/L — ABNORMAL LOW (ref 136–145)
Lab: 197 U/L — ABNORMAL HIGH (ref 40–150)
Lab: 204 mg/dL — ABNORMAL HIGH (ref 70–105)
Lab: 24 mmol/L
Lab: 26 U/L
Lab: 31 U/L
Lab: 33 mg/dL — ABNORMAL HIGH (ref 9.8–20.1)
Lab: 39 — ABNORMAL LOW (ref >59)
Lab: 4.1 mmol/L — ABNORMAL LOW (ref 80.0–99.0)
Lab: 8.1 mg/dL — ABNORMAL LOW (ref 8.4–10.2)

## 2019-12-31 LAB — CBC AND DIFF
Lab: 0 10*3/uL (ref 0–0.20)
Lab: 0 10*3/uL (ref 0–0.45)
Lab: 0.5 10*3/uL (ref 0–0.80)
Lab: 0.8 10*3/uL — ABNORMAL LOW (ref 1.0–4.8)
Lab: 1 % (ref 0–2)
Lab: 1 % (ref 0–5)
Lab: 12 % — ABNORMAL LOW (ref 24–44)
Lab: 19 % — ABNORMAL LOW (ref 36–45)
Lab: 2.8 M/UL — ABNORMAL LOW (ref 4.0–5.0)
Lab: 21 % — ABNORMAL HIGH (ref 11–15)
Lab: 21 pg — ABNORMAL LOW (ref 26–34)
Lab: 265 10*3/uL (ref 150–400)
Lab: 30 g/dL — ABNORMAL LOW (ref 32.0–36.0)
Lab: 5.1 10*3/uL (ref 1.8–7.0)
Lab: 6.1 g/dL — ABNORMAL LOW (ref 12.0–15.0)
Lab: 6.7 10*3/uL (ref 4.5–11.0)
Lab: 69 FL — ABNORMAL LOW (ref 80–100)
Lab: 77 % (ref 41–77)
Lab: 9 % (ref 4–12)
Lab: 9.3 FL (ref 7–11)
Lab: 7 mg/dL — ABNORMAL HIGH (ref <100)

## 2019-12-31 LAB — PROTIME INR (PT)
Lab: 1.3 mg/dL — ABNORMAL LOW (ref 2.0–3.5)
Lab: 14 s — ABNORMAL HIGH (ref 9.9–12.6)

## 2019-12-31 MED ORDER — ACETAMINOPHEN 500 MG PO TAB
500 mg | Freq: Once | ORAL | 0 refills | Status: CP
Start: 2019-12-31 — End: ?

## 2019-12-31 MED ORDER — DIPHENHYDRAMINE HCL 25 MG PO CAP
25 mg | Freq: Once | ORAL | 0 refills | Status: CN
Start: 2019-12-31 — End: ?

## 2019-12-31 MED ORDER — IRON DEXTRAN IVPB
975 mg | Freq: Once | INTRAVENOUS | 0 refills | Status: CN
Start: 2019-12-31 — End: ?

## 2019-12-31 MED ORDER — IRON DEXTRAN IVPB
25 mg | Freq: Once | INTRAVENOUS | 0 refills | Status: CP
Start: 2019-12-31 — End: ?

## 2019-12-31 MED ORDER — IRON DEXTRAN IVPB
25 mg | Freq: Once | INTRAVENOUS | 0 refills | Status: CN
Start: 2019-12-31 — End: ?

## 2019-12-31 MED ORDER — IRON DEXTRAN IVPB
975 mg | Freq: Once | INTRAVENOUS | 0 refills | Status: CP
Start: 2019-12-31 — End: ?

## 2019-12-31 MED ORDER — DIPHENHYDRAMINE HCL 25 MG PO CAP
25 mg | Freq: Once | ORAL | 0 refills | Status: CP
Start: 2019-12-31 — End: ?

## 2019-12-31 MED ORDER — ACETAMINOPHEN 500 MG PO TAB
500 mg | Freq: Once | ORAL | 0 refills | Status: CN
Start: 2019-12-31 — End: ?

## 2019-12-31 NOTE — Patient Instructions
Text SUPPORT1 to [phone] to learn if you may be eligible for financial support with your medication(s).    Msg & Data Rates May Apply. Msg freq varies. Terms apply. Text HELP for help. Text STOP to end.   Iron Dextran injection  Brand Name: INFeD  What is this medicine?  IRON DEXTRAN (AHY ern DEX tran) is an iron complex. Iron is used to make healthy red blood cells, which carry oxygen and nutrients through the body. This medicine is used to treat people who cannot take iron by mouth and have low levels of iron in the blood.  How should I use this medicine?  This medicine is for injection into a vein or a muscle. It is given by a health care professional in a hospital or clinic setting.  Talk to your pediatrician regarding the use of this medicine in children. While this drug may be prescribed for children as young as 51 months old for selected conditions, precautions do apply.  What side effects may I notice from receiving this medicine?  Side effects that you should report to your doctor or health care professional as soon as possible:  ? allergic reactions like skin rash, itching or hives, swelling of the face, lips, or tongue  ? blue lips, nails, or skin  ? breathing problems  ? changes in blood pressure  ? chest pain  ? confusion  ? fast, irregular heartbeat  ? feeling faint or lightheaded, falls  ? fever or chills  ? flushing, sweating, or hot feelings  ? joint or muscle aches or pains  ? pain, tingling, numbness in the hands or feet  ? seizures  ? unusually weak or tired  Side effects that usually do not require medical attention (report to your doctor or health care professional if they continue or are bothersome):  ? change in taste (metallic taste)  ? diarrhea  ? headache  ? irritation at site where injected  ? nausea, vomiting  ? stomach upset  What may interact with this medicine?  Do not take this medicine with any of the following medications:  ? deferoxamine  ? dimercaprol  ? other iron products  This medicine may also interact with the following medications:  ? chloramphenicol  ? deferasirox  What if I miss a dose?  It is important not to miss your dose. Call your doctor or health care professional if you are unable to keep an appointment.  Where should I keep my medicine?  This drug is given in a hospital or clinic and will not be stored at home.  What should I tell my health care provider before I take this medicine?  They need to know if you have any of these conditions:  ? anemia not caused by low iron levels  ? heart disease  ? high levels of iron in the blood  ? kidney disease  ? liver disease  ? an unusual or allergic reaction to iron, other medicines, foods, dyes, or preservatives  ? pregnant or trying to get pregnant  ? breast-feeding  What should I watch for while using this medicine?  Visit your doctor or health care professional regularly. Tell your doctor if your symptoms do not start to get better or if they get worse. You may need blood work done while you are taking this medicine.  You may need to follow a special diet. Talk to your doctor. Foods that contain iron include: whole grains/cereals, dried fruits, beans, or peas, leafy green  vegetables, and organ meats (liver, kidney).  Long-term use of this medicine may increase your risk of some cancers. Talk to your doctor about how to limit your risk.  NOTE:This sheet is a summary. It may not cover all possible information. If you have questions about this medicine, talk to your doctor, pharmacist, or health care provider. Copyright? 2020 [employer]

## 2020-01-01 ENCOUNTER — Ambulatory Visit: Admit: 2020-01-01 | Discharge: 2020-01-02 | Payer: MEDICARE

## 2020-01-01 ENCOUNTER — Encounter: Admit: 2020-01-01 | Discharge: 2020-01-01 | Payer: MEDICARE

## 2020-01-01 DIAGNOSIS — D649 Anemia, unspecified: Secondary | ICD-10-CM

## 2020-01-01 NOTE — Patient Instructions
[facility]  OUTPATIENT POST TRANSFUSION INSTRUCTIONS     During your transfusion you were monitored by nursing staff for signs of a transfusion reaction, however, you should continue to observe for the following symptoms after you have been dismissed from the clinic:     FEVER OF 100.5 OR HIGHER  CHILLS  NAUSEA OR VOMITING  FEELING FAINT OR DIZZY  SHORTNESS OF BREATH  DARK OR RED COLORED URINE  CHEST OR BACK PAIN  SHOCK OR LOSS OF CONSCIOUSNESS  YELLOWING OF THE EYES OR SKIN     If you or your caregiver notice any of these symptoms, contact your ordering physician immediately and go to the nearest Emergency Department for treatment. When you arrive at the Emergency Department notify them that you recently received a blood transfusion. *For more detailed signs and symptoms of a transfusion reaction, please refer to the [place] education information below.          When You Need a Blood Transfusion (Adult)  A blood transfusion may be done when you have lost blood because of an injury or during surgery. It can also be done because of diseases or conditions that affect the blood. Blood is made up of several different parts (blood products). You may receive some or all of these blood products during a transfusion. Blood for transfusion is usually donated from another person (donor). Strict measures are taken to make sure that donated blood is safe before it's given to you. This sheet helps you understand how a blood transfusion is done. Your healthcare provider will discuss your condition with you and answer your questions.   The parts of blood  Blood can be broken down into different parts that perform special roles in the body. These parts include:  ? Red blood cells, which carry oxygen throughout the body.  ? Platelets, which help stop bleeding.  ? Plasma (the liquid part of blood), which carries red blood cells and platelets throughout the body. Plasma also helps platelets in stopping bleeding. Where does donated blood come from?  ? Volunteer donors. These are people who donate their blood to help others in need of blood. Blood donation can take place at several places, including a hospital, blood bank, or during a blood drive.  ? Directed donation. If you need a blood transfusion during a planned surgery, family and friends can have their blood tested for compatibility and donate blood for you before the surgery. This needs to be done at least 7 day(s) in advance. This is because the blood must be tested for safety.  ? Autologous donation. This is also called self-donation. For planned surgery, you can donate your own blood starting up to 6 weeks before surgery.  Are blood transfusions safe?  Donated blood is tested and processed to make sure that the blood is safe:  ? The health and medical history of each donor is carefully screened. If a person is considered high-risk for infection or problems, he or she isn't accepted as a blood donor.  ? Donated blood is tested for infections such as hepatitis, syphilis, and HIV (the virus that causes AIDS). If the tested blood is found to be unsafe, it's destroyed.  ? Blood is divided into four types: A, B, AB, and O. Blood also has Rh types: positive (+) and negative (-). You can only receive blood products that are compatible with (match) your blood type. A sample of your blood is tested for compatibility with donated blood. This is  done before blood products are prepared for a transfusion.  How is a blood transfusion done?  A blood transfusion takes place in a blood center, infusion center, hospital room, or operating room. Your healthcare provider will discuss the blood transfusion with you before it's done. You'll need to give permission for the blood transfusion by signing a consent form.  ? Two healthcare providers confirm your identity. They also confirm that they have the correct blood product(s) for you.  ? An intravenous (IV) line is placed in a vein if you do not already have an IV.  ? The blood product comes in a plastic bag that is hung on an IV pole. The blood product flows from the bag into your IV line. The IV line may be connected to a pump, which controls the transfusion rate. You may receive more than one kind of blood product through the IV.  ? Your vital signs (blood pressure, heart rate, respiratory rate, and temperature) are checked throughout the transfusion. This is to make sure you are not having a reaction to the blood product.  ? The IV line may be removed once the transfusion is complete.  Possible risks and complications of blood transfusions  Most transfusions are problem free. In some cases, reactions occur. These can happen within seconds to minutes during the transfusion or a week to a few months after the transfusion. Call your doctor or nurse right away if you have any of the signs or symptoms in the table below during or after a transfusion:  Reaction Timing Symptoms   Allergic reaction (mild) ? Within seconds to minutes during the transfusion  ? Up to 24 hours after the transfusion Hives or red welts on the skin, mild itching, rash, localized swelling, flushing (red face), wheezing, shortness of breath, or stridor (high-pitched noise or sound)   Anaphylactic reaction ? Within seconds to minutes during the transfusion  ? Up to 24 hours after the transfusion Shortness of breath, flushing (red face), wheezing, labored (working hard) breathing, low blood pressure, localized swelling, chest tightness, or cramps   Febrile nonhemolytic reaction ? Within minutes to hours during the transfusion  ? Within a few hours to 24 hours after the transfusion Fever (increase of 1? C or higher), chills, flushing (red face), nausea, headache, minor discomfort, or mild shortness of breath   Acute immune hemolytic reaction ? Within minutes during the transfusion  ? Up to 24 hours after the transfusion Fever, red or brown urine, back pain, fast heart rate (tachycardia), abdominal pain, low blood pressure, feeling anxious, chills, chest pain, nausea, or fainting spells   Transfusion-related acute lung injury (TRALI) ? Within 1 to 2 hours during the transfusion  ? Up to 6 hours after the transfusion Shortness of breath, trouble breathing, low blood pressure, fever, pulmonary edema   Transfusion-associated circulatory overload ? Near the end of the transfusion  ? Within 6 hours after the transfusion Shortness of breath, fast heart rate (tachycardia), problems breathing when lying on back, abnormal blood pressure   Post-transfusion purpura (PUP) ? Within 1 week  ? Up to 48 days after the transfusion Purple spots on skin; nose bleed; bleeding from the urinary tract, abdomen, colon, or rectum; fever; or chills     Delayed transfusion-related acute lung injury (TRALI) ? Within 72 hours (3 days) after the transfusion Sudden onset of respiratory distress or trouble breathing   Delayed hemolytic reaction ? Within 3 to 7 days  ? Up to weeks after the  transfusion Low-grade fever, mild jaundice (yellowing of the skin and whites of the eyes), decrease in hematocrit, chills, chest pain, back pain, nausea   ? 2000-2019 [employer], [place]. [address] [place], [place] [address]. All rights reserved. This information is not intended as a substitute for professional medical care. Always follow your healthcare professional's instructions.

## 2020-01-04 ENCOUNTER — Encounter: Admit: 2020-01-04 | Discharge: 2020-01-04 | Payer: MEDICARE

## 2020-01-04 DIAGNOSIS — K760 Fatty (change of) liver, not elsewhere classified: Secondary | ICD-10-CM

## 2020-01-04 DIAGNOSIS — K7469 Other cirrhosis of liver: Secondary | ICD-10-CM

## 2020-01-04 DIAGNOSIS — K31819 Angiodysplasia of stomach and duodenum without bleeding: Secondary | ICD-10-CM

## 2020-01-04 NOTE — Telephone Encounter
Spoke to patient and confirmed OV w/ RT on 01/06/20 @ 8am. Pt plans to do labs before seeing RT. Pt added to lab schedule for before 8am.

## 2020-01-04 NOTE — Telephone Encounter
orders placed for patient to get prior to appt. on Wednesday. Patient notified she can have labs at [place] this week instead of [place], she v/u.

## 2020-01-05 ENCOUNTER — Encounter: Admit: 2020-01-05 | Discharge: 2020-01-05 | Payer: MEDICARE

## 2020-01-06 ENCOUNTER — Inpatient Hospital Stay: Admit: 2020-01-06 | Payer: MEDICARE

## 2020-01-06 ENCOUNTER — Encounter: Admit: 2020-01-06 | Discharge: 2020-01-06 | Payer: MEDICARE

## 2020-01-06 ENCOUNTER — Ambulatory Visit: Admit: 2020-01-06 | Discharge: 2020-01-07 | Payer: MEDICARE

## 2020-01-06 ENCOUNTER — Ambulatory Visit: Admit: 2020-01-06 | Discharge: 2020-01-06 | Payer: MEDICARE

## 2020-01-06 ENCOUNTER — Inpatient Hospital Stay: Admit: 2020-01-06 | Discharge: 2020-01-06 | Payer: MEDICARE

## 2020-01-06 DIAGNOSIS — J329 Chronic sinusitis, unspecified: Secondary | ICD-10-CM

## 2020-01-06 DIAGNOSIS — K7460 Unspecified cirrhosis of liver: Secondary | ICD-10-CM

## 2020-01-06 DIAGNOSIS — E669 Obesity, unspecified: Secondary | ICD-10-CM

## 2020-01-06 DIAGNOSIS — R0600 Dyspnea, unspecified: Secondary | ICD-10-CM

## 2020-01-06 DIAGNOSIS — D500 Iron deficiency anemia secondary to blood loss (chronic): Secondary | ICD-10-CM

## 2020-01-06 DIAGNOSIS — R4182 Altered mental status, unspecified: Secondary | ICD-10-CM

## 2020-01-06 DIAGNOSIS — K5730 Diverticulosis of large intestine without perforation or abscess without bleeding: Secondary | ICD-10-CM

## 2020-01-06 DIAGNOSIS — Z01810 Encounter for preprocedural cardiovascular examination: Secondary | ICD-10-CM

## 2020-01-06 DIAGNOSIS — E119 Type 2 diabetes mellitus without complications: Secondary | ICD-10-CM

## 2020-01-06 DIAGNOSIS — I10 Essential (primary) hypertension: Secondary | ICD-10-CM

## 2020-01-06 DIAGNOSIS — K760 Fatty (change of) liver, not elsewhere classified: Secondary | ICD-10-CM

## 2020-01-06 DIAGNOSIS — I729 Aneurysm of unspecified site: Secondary | ICD-10-CM

## 2020-01-06 DIAGNOSIS — K7469 Other cirrhosis of liver: Secondary | ICD-10-CM

## 2020-01-06 DIAGNOSIS — K31819 Angiodysplasia of stomach and duodenum without bleeding: Secondary | ICD-10-CM

## 2020-01-06 LAB — CBC AND DIFF
Lab: 0.1 K/UL (ref 0–0.20)
Lab: 12 K/UL — ABNORMAL HIGH (ref 4.5–11.0)
Lab: 3.3 M/UL — ABNORMAL LOW (ref 4.0–5.0)

## 2020-01-06 LAB — PROTIME INR (PT): Lab: 1.4 MMOL/L — ABNORMAL HIGH (ref 0.8–1.2)

## 2020-01-06 LAB — COMPREHENSIVE METABOLIC PANEL
Lab: 1.4 mg/dL — ABNORMAL HIGH (ref 0.4–1.00)
Lab: 10 10*3/uL — ABNORMAL LOW (ref 3–12)
Lab: 133 mg/dL — ABNORMAL HIGH (ref 70–100)
Lab: 135 MMOL/L — ABNORMAL LOW (ref 137–147)
Lab: 2.5 mg/dL — ABNORMAL HIGH (ref 0.3–1.2)
Lab: 2.9 g/dL — ABNORMAL LOW (ref 3.5–5.0)
Lab: 37 mL/min — ABNORMAL LOW (ref >60)
Lab: 4.1 MMOL/L — ABNORMAL LOW (ref 3.5–5.1)
Lab: 45 mL/min — ABNORMAL LOW (ref >60)
Lab: 6 g/dL (ref 6.0–8.0)
Lab: 8.9 mg/dL (ref 8.5–10.6)

## 2020-01-06 LAB — IRON + BINDING CAPACITY + %SAT+ FERRITIN
Lab: 154 ng/mL — ABNORMAL HIGH (ref 10–200)
Lab: 361 ug/dL (ref 270–380)
Lab: 79 ug/dL — ABNORMAL HIGH (ref 50–160)

## 2020-01-06 LAB — AMMONIA: Lab: 59 umol/L — ABNORMAL HIGH (ref 9–35)

## 2020-01-06 LAB — ALPHA FETO PROTEIN (AFP): Lab: 2.8 ng/mL — ABNORMAL LOW (ref 0.0–15.0)

## 2020-01-06 LAB — 25-OH VITAMIN D (D2 + D3): Lab: 37 ng/mL — ABNORMAL HIGH (ref 30–80)

## 2020-01-06 LAB — POC GLUCOSE: Lab: 109 mg/dL — ABNORMAL HIGH (ref 70–100)

## 2020-01-06 MED ORDER — ACETAMINOPHEN 325 MG PO TAB
650 mg | ORAL | 0 refills | Status: DC | PRN
Start: 2020-01-06 — End: 2020-01-08

## 2020-01-06 MED ORDER — DULOXETINE 60 MG PO CPDR
60 mg | Freq: Every day | ORAL | 0 refills | Status: DC
Start: 2020-01-06 — End: 2020-01-08
  Administered 2020-01-07 – 2020-01-08 (×2): 60 mg via ORAL

## 2020-01-06 MED ORDER — POLYETHYLENE GLYCOL 3350 17 GRAM PO PWPK
17 g | Freq: Two times a day (BID) | ORAL | 0 refills | Status: DC
Start: 2020-01-06 — End: 2020-01-08
  Administered 2020-01-07 – 2020-01-08 (×4): 17 g via ORAL

## 2020-01-06 MED ORDER — ONDANSETRON HCL 4 MG PO TAB
4 mg | ORAL | 0 refills | Status: DC | PRN
Start: 2020-01-06 — End: 2020-01-08

## 2020-01-06 MED ORDER — HEPARIN, PORCINE (PF) 5,000 UNIT/0.5 ML IJ SYRG
5000 [IU] | SUBCUTANEOUS | 0 refills | Status: DC
Start: 2020-01-06 — End: 2020-01-08
  Administered 2020-01-06 – 2020-01-08 (×6): 5000 [IU] via SUBCUTANEOUS

## 2020-01-06 MED ORDER — FUROSEMIDE 10 MG/ML IJ SOLN
20 mg | Freq: Once | INTRAVENOUS | 0 refills | Status: CP
Start: 2020-01-06 — End: ?
  Administered 2020-01-06: 23:00:00 20 mg via INTRAVENOUS

## 2020-01-06 MED ORDER — CEFTRIAXONE INJ 2GM IVP
2 g | INTRAVENOUS | 0 refills | Status: DC
Start: 2020-01-06 — End: 2020-01-08
  Administered 2020-01-06 – 2020-01-08 (×3): 2 g via INTRAVENOUS

## 2020-01-06 MED ORDER — MELATONIN 3 MG PO TAB
3 mg | Freq: Every evening | ORAL | 0 refills | Status: DC | PRN
Start: 2020-01-06 — End: 2020-01-08
  Administered 2020-01-08: 03:00:00 3 mg via ORAL

## 2020-01-06 MED ORDER — ONDANSETRON HCL (PF) 4 MG/2 ML IJ SOLN
4 mg | INTRAVENOUS | 0 refills | Status: DC | PRN
Start: 2020-01-06 — End: 2020-01-08

## 2020-01-06 MED ORDER — ZINC SULFATE 220 (50) MG PO CAP
220 mg | Freq: Every day | ORAL | 0 refills | Status: DC
Start: 2020-01-06 — End: 2020-01-08
  Administered 2020-01-07 – 2020-01-08 (×2): 220 mg via ORAL

## 2020-01-06 MED ORDER — RIFAXIMIN 550 MG PO TAB
550 mg | Freq: Two times a day (BID) | ORAL | 0 refills | Status: DC
Start: 2020-01-06 — End: 2020-01-08
  Administered 2020-01-07 – 2020-01-08 (×4): 550 mg via ORAL

## 2020-01-06 MED ORDER — ATORVASTATIN 10 MG PO TAB
10 mg | Freq: Every day | ORAL | 0 refills | Status: DC
Start: 2020-01-06 — End: 2020-01-08
  Administered 2020-01-07 – 2020-01-08 (×2): 10 mg via ORAL

## 2020-01-06 MED ORDER — PANTOPRAZOLE 40 MG PO TBEC
40 mg | Freq: Two times a day (BID) | ORAL | 0 refills | Status: DC
Start: 2020-01-06 — End: 2020-01-08
  Administered 2020-01-07 – 2020-01-08 (×4): 40 mg via ORAL

## 2020-01-06 MED ORDER — DOCUSATE SODIUM 100 MG PO CAP
100 mg | Freq: Every day | ORAL | 0 refills | Status: DC | PRN
Start: 2020-01-06 — End: 2020-01-08

## 2020-01-07 ENCOUNTER — Encounter: Admit: 2020-01-07 | Discharge: 2020-01-07 | Payer: MEDICARE

## 2020-01-07 ENCOUNTER — Inpatient Hospital Stay: Admit: 2020-01-07 | Discharge: 2020-01-07 | Payer: MEDICARE

## 2020-01-07 DIAGNOSIS — K760 Fatty (change of) liver, not elsewhere classified: Secondary | ICD-10-CM

## 2020-01-07 DIAGNOSIS — R4182 Altered mental status, unspecified: Secondary | ICD-10-CM

## 2020-01-07 DIAGNOSIS — K5730 Diverticulosis of large intestine without perforation or abscess without bleeding: Secondary | ICD-10-CM

## 2020-01-07 DIAGNOSIS — K7460 Unspecified cirrhosis of liver: Secondary | ICD-10-CM

## 2020-01-07 DIAGNOSIS — Z01810 Encounter for preprocedural cardiovascular examination: Secondary | ICD-10-CM

## 2020-01-07 DIAGNOSIS — R0600 Dyspnea, unspecified: Secondary | ICD-10-CM

## 2020-01-07 DIAGNOSIS — E669 Obesity, unspecified: Secondary | ICD-10-CM

## 2020-01-07 DIAGNOSIS — I729 Aneurysm of unspecified site: Secondary | ICD-10-CM

## 2020-01-07 DIAGNOSIS — E119 Type 2 diabetes mellitus without complications: Secondary | ICD-10-CM

## 2020-01-07 DIAGNOSIS — I10 Essential (primary) hypertension: Secondary | ICD-10-CM

## 2020-01-07 DIAGNOSIS — J329 Chronic sinusitis, unspecified: Secondary | ICD-10-CM

## 2020-01-07 LAB — URINALYSIS MICROSCOPIC REFLEX TO CULTURE

## 2020-01-07 LAB — URINALYSIS DIPSTICK REFLEX TO CULTURE
Lab: NEGATIVE
Lab: NEGATIVE
Lab: NEGATIVE
Lab: NEGATIVE
Lab: NEGATIVE

## 2020-01-07 LAB — COVID-19 (SARS-COV-2) PCR

## 2020-01-07 MED ORDER — LIDOCAINE 5 % TP PTMD
1 | Freq: Every day | TOPICAL | 0 refills | Status: DC
Start: 2020-01-07 — End: 2020-01-08
  Administered 2020-01-07 – 2020-01-08 (×2): 1 via TOPICAL

## 2020-01-07 MED ORDER — ALBUMIN, HUMAN 25 % IV SOLP
0 refills | Status: CP
Start: 2020-01-07 — End: ?
  Administered 2020-01-07 (×3): 12.5 g via INTRAVENOUS

## 2020-01-07 MED ORDER — LIDOCAINE 5 % TP PTMD
1 | Freq: Every day | TOPICAL | 0 refills | Status: DC
Start: 2020-01-07 — End: 2020-01-08
  Administered 2020-01-08 (×2): 1 via TOPICAL

## 2020-01-08 ENCOUNTER — Encounter: Admit: 2020-01-08 | Discharge: 2020-01-08 | Payer: MEDICARE

## 2020-01-08 MED ORDER — CIPROFLOXACIN HCL 500 MG PO TAB
500 mg | ORAL_TABLET | Freq: Every day | ORAL | 3 refills | 10.00000 days | Status: DC
Start: 2020-01-08 — End: 2020-02-05
  Filled 2020-01-08: qty 32, 29d supply, fill #1

## 2020-01-08 MED ORDER — FUROSEMIDE 20 MG PO TAB
20 mg | ORAL_TABLET | Freq: Every morning | ORAL | 1 refills | 90.00000 days | Status: DC
Start: 2020-01-08 — End: 2020-01-21

## 2020-01-08 MED ORDER — CIPROFLOXACIN HCL 500 MG PO TAB
500 mg | ORAL_TABLET | Freq: Every day | ORAL | 3 refills | 10.00000 days | Status: DC
Start: 2020-01-08 — End: 2020-01-08

## 2020-01-09 ENCOUNTER — Encounter: Admit: 2020-01-09 | Discharge: 2020-01-09 | Payer: MEDICARE

## 2020-01-10 ENCOUNTER — Encounter: Admit: 2020-01-10 | Discharge: 2020-01-10 | Payer: MEDICARE

## 2020-01-10 DIAGNOSIS — I729 Aneurysm of unspecified site: Secondary | ICD-10-CM

## 2020-01-10 DIAGNOSIS — K760 Fatty (change of) liver, not elsewhere classified: Secondary | ICD-10-CM

## 2020-01-10 DIAGNOSIS — E669 Obesity, unspecified: Secondary | ICD-10-CM

## 2020-01-10 DIAGNOSIS — K5730 Diverticulosis of large intestine without perforation or abscess without bleeding: Secondary | ICD-10-CM

## 2020-01-10 DIAGNOSIS — Z01810 Encounter for preprocedural cardiovascular examination: Secondary | ICD-10-CM

## 2020-01-10 DIAGNOSIS — K7460 Unspecified cirrhosis of liver: Secondary | ICD-10-CM

## 2020-01-10 DIAGNOSIS — E119 Type 2 diabetes mellitus without complications: Secondary | ICD-10-CM

## 2020-01-10 DIAGNOSIS — I10 Essential (primary) hypertension: Secondary | ICD-10-CM

## 2020-01-10 DIAGNOSIS — J329 Chronic sinusitis, unspecified: Secondary | ICD-10-CM

## 2020-01-10 DIAGNOSIS — R0600 Dyspnea, unspecified: Secondary | ICD-10-CM

## 2020-01-11 ENCOUNTER — Encounter: Admit: 2020-01-11 | Discharge: 2020-01-11 | Payer: MEDICARE

## 2020-01-11 DIAGNOSIS — K760 Fatty (change of) liver, not elsewhere classified: Secondary | ICD-10-CM

## 2020-01-11 NOTE — Telephone Encounter
Requested call regarding Furosamide

## 2020-01-11 NOTE — Telephone Encounter
Spoke with patient post-hospital discharge.  Patient reports she is doing fine with Cipro.   Aldactone was discontinued during hospitalization and she is currently only on Lasix 20 mg daily. She c/o ascites and lower extremity swelling. She was sent home with compression stockings, but is unable to get them on her legs d/t swelling. Discussed the important of low sodium diet (less than 2 g daily) and reading sodium on packages. She states she is trying her best to watch the sodium.    She will have her weekly labs 2/23.

## 2020-01-11 NOTE — Telephone Encounter
spoke to patient. She will have labs completed at [place] tomorrow at [place]. Lab appt. scheduled.   Discussed case with Garner Nash, who agrees to have patient get labs prior to diuretic adjustment. Patient advised to elevate feet this evening. She v/u and agrees to plan.

## 2020-01-12 ENCOUNTER — Encounter: Admit: 2020-01-12 | Discharge: 2020-01-12 | Payer: MEDICARE

## 2020-01-12 ENCOUNTER — Ambulatory Visit: Admit: 2020-01-12 | Discharge: 2020-01-12 | Payer: MEDICARE

## 2020-01-12 DIAGNOSIS — K760 Fatty (change of) liver, not elsewhere classified: Secondary | ICD-10-CM

## 2020-01-12 LAB — CBC AND DIFF
Lab: 0.1 10*3/uL (ref 0–0.20)
Lab: 0.1 10*3/uL (ref 0–0.45)
Lab: 0.6 10*3/uL — ABNORMAL LOW (ref 1.0–4.8)
Lab: 0.7 10*3/uL (ref 0–0.80)
Lab: 10 % — ABNORMAL LOW (ref 24–44)
Lab: 11 % (ref 4–12)
Lab: 196 10*3/uL — ABNORMAL HIGH (ref 150–400)
Lab: 2 % (ref >60)
Lab: 2 % — ABNORMAL LOW (ref >60)
Lab: 26 pg — ABNORMAL LOW (ref 26–34)
Lab: 27 % — ABNORMAL LOW (ref 36–45)
Lab: 3.2 M/UL — ABNORMAL LOW (ref 4.0–5.0)
Lab: 31 g/dL — ABNORMAL LOW (ref 32.0–36.0)
Lab: 33 % — ABNORMAL HIGH (ref 11–15)
Lab: 5 10*3/uL (ref 1.8–7.0)
Lab: 6.7 K/UL (ref 4.5–11.0)
Lab: 75 % (ref 41–77)
Lab: 8.5 FL — ABNORMAL HIGH (ref 7–11)
Lab: 8.6 g/dL — ABNORMAL LOW (ref 12.0–15.0)
Lab: 84 FL (ref 80–100)

## 2020-01-12 LAB — COMPREHENSIVE METABOLIC PANEL
Lab: 133 MMOL/L — ABNORMAL LOW (ref 137–147)
Lab: 3.8 MMOL/L (ref 3.5–5.1)

## 2020-01-12 LAB — PROTIME INR (PT): Lab: 1.3 % — ABNORMAL HIGH (ref >60)

## 2020-01-12 NOTE — Telephone Encounter
Spoke to patient and scheduled 2 week HFU w/ JT via [place].

## 2020-01-14 ENCOUNTER — Encounter: Admit: 2020-01-14 | Discharge: 2020-01-14 | Payer: MEDICARE

## 2020-01-14 NOTE — Patient Education
Dear Ms. Franky Macho,     Thank you for choosing [facility] for your procedure. Your appointment information is listed below:    Appointment Date: 01/15/2020  Appointment Time: 12:00 PM  Arrival Time: 11:00 AM  Location:        ? [place]: [address] [place], [place]  [address]  Parking: [address]      INTERVENTIONAL RADIOLOGY  PRE-PROCEDURE [place] are scheduled for a procedure in Interventional Radiology.  Please follow these instructions and any direction from your Primary Care/Managing Physician.  If you have questions about your procedure or need to reschedule please call [phone].      Medication Instructions:  Continue scheduled medication.       Diet Instructions: Maintain regular diet with no restrictions.     Day of Exam Instructions:  1. Bathe or shower with an antibacterial soap prior to your appointment.  2. Bring a list of your current medications and the dosages.  3. Wear comfortable clothing and leave valuables at home.  4. Arrive (1) hour prior to your appointment.  This time will be spent registering, interviewing, assessing, educating, and preparing you for the test.  ? You will be with [place] anywhere from 30 minutes to 2 hours after your exam depending on your procedure.  5. Depending on the procedure and as instructed by the nurse a responsible adult may be required to drive you home (no [place], taxis or buses are allowed). If a driver is required and you do not have one  we will be unable to perform your procedure.   6. The nurse will give you discharge instructions about your care and activities after the procedure.

## 2020-01-14 NOTE — Progress Notes
Interventional Radiology Outpatient Scheduling Checklist      1.  Name of Procedure(s):   Paracentesis      2.  Date of Procedure:   01/15/2020      3.  Arrival Time:   1100      4.  Procedure Time:  5573      2.  Correct Procedural Room Assignment:  [facility] Room 8      6.  Blood Thinners Triaged and instructed per protocol: Y/N/NA:  NA  Confirmed accurate instructions sent to patient: Y/N:  NA       7.  Procedure Order Verified: Y/N:  Yes      9.  Patient instructed to have a driver: Y/N/NA:  Yes    10.  Patient instructed on NPO status: Y/N/NA:  NA  Confirmed accurate instructions sent to patient: Y/N:  Yes    11.  Specimen needed: Y/N/NA:  Yes   Verified Order placed: Y/N:  Yes    12.  Allergies Verified:  Y/N:  Yes    13.  Is there an Iodine Allergy: Y/N:  No  Does the Procedure Require contrast: Y/N:  NO   If so, was the IR- Contrast Allergy Pre-Procedure Medication protocol ordered: Y/NA:  NA    14.  Does the patient have labs according to IR Pre-procedure Laboratory Parameter policy: Y/N/NA:  NA  If No, was the patient instructed to obtain labs prior to procedure: Y/N/NA:  NA     15.  Will the patient need to be admitted or have a possible admission: Y/N:  No  If yes, confirmed accurate instructions sent to patient: Y/N/NA:  NA     16.  Patient States Understanding: Y/N:  Yes    17.  History of OSA:  Y/N:  No  If yes, confirm request to bring CPAP sent to patient: Y/N/NA:  NA    18. Patient declines electronic procedure instructions: Y/N:  No; my chart

## 2020-01-15 ENCOUNTER — Encounter: Admit: 2020-01-15 | Discharge: 2020-01-15 | Payer: MEDICARE

## 2020-01-15 ENCOUNTER — Ambulatory Visit: Admit: 2020-01-15 | Discharge: 2020-01-15 | Payer: MEDICARE

## 2020-01-15 DIAGNOSIS — K7469 Other cirrhosis of liver: Secondary | ICD-10-CM

## 2020-01-15 DIAGNOSIS — K760 Fatty (change of) liver, not elsewhere classified: Principal | ICD-10-CM

## 2020-01-15 DIAGNOSIS — K31819 Angiodysplasia of stomach and duodenum without bleeding: Secondary | ICD-10-CM

## 2020-01-15 DIAGNOSIS — D500 Iron deficiency anemia secondary to blood loss (chronic): Secondary | ICD-10-CM

## 2020-01-15 LAB — GRAM STAIN

## 2020-01-15 MED ORDER — ALBUMIN, HUMAN 25 % IV SOLP
0 refills | Status: CP
Start: 2020-01-15 — End: ?

## 2020-01-15 NOTE — Progress Notes
Reviewed discharge instructions with patient. Pt verbalizes understanding and had no further questions at this time. PIV was removed, site clean, dry and intact. Pt was wheeled to front and into private vehicle with driver.

## 2020-01-15 NOTE — Patient Instructions
INTERVENTIONAL RADIOLOGY DISCHARGE INSTRUCTIONS AT THE [facility]  FLUID ASPIRATION?  This procedure is done to remove fluid that has accumulated abnormally. In some cases, a fluid collection may form that is not infected but that may need to be drained to alleviate pressure or pain on surrounding tissues. In this procedure, the Interventional Radiologist carefully places a small needle into the fluid collection using CT or ultrasound guidance.? The fluid is then aspirated or drawn off and sent to the lab for testing.?  AFTER THE PROCEDURE:   ? You will recover in Interventional Radiology for a minimum of 30 minutes after your procedure.  ? You will be on bedrest with bathroom privileges during this time.?  POST-PROCEDURE PAIN:   ? Pain control following your procedure is a priority for both you and your Physicians.  ? Some soreness or tenderness at the site is to be expected for several days. We recommend taking over the counter analgesics to help relieve this pain.  ? Alternative methods for pain relief include but not limited to heat or cold compress, relaxation techniques, rest, and changing of positions.?  ? If pain continues after 5-7 days or you have severe pain not relieved by medication, please contact us as directed below.?  POST-PROCEDURE ACTIVITY:   ? A responsible adult must drive you home.  ? If you receive sedation, narcotic pain medication or anesthesia for the procedure, you should not drive or operate heavy machinery or do anything that requires concentration for at least 24 hours after procedure completion.  ? It is recommended that a responsible adult be with you until morning.  ? Other activity restrictions such as lifting restrictions will depend upon the area aspirated and will be determined by the physician after your procedure.? ? ? ? ? ? ??  POST-PROCEDURE SITE CARE:   ? You will have a small bandage over the procedure site.? Keep this dry.?  ? You may remove it in 24 hours.  ? You may shower in 24 hours, after removing the bandage.  ? Do not submerge the procedure site for 1 week (no bathtub, swimming, hot tub, etc.)  ? Do not use ointments, creams or powders on the puncture site.  ? Be sure your hands are clean when touching near the site.?  DIET/MEDICATIONS:   ? You may resume your previous diet after the procedure.  ? If you receive sedation or anesthesia for the procedure, avoid any foods or beverages containing alcohol for at least 24 hours.  ? Please see the Medication Reconciliation sheet for instructions regarding resuming your home medications.??  CALL THE DOCTOR IF:   ? Bright red blood soaks the bandage.  ? You have pain not relieved by medication.? Some soreness at the site is to be expected.  ? You have signs of infection such as: fever greater than 101F, chills, redness, warmth, swelling, drainage or pus from the puncture site.  ?  For any of the above symptoms or for problems or concerns related to the procedure, call?[phone] Monday-Friday from 7-5p.? After-hours and weekends, please call?[phone] and ask for the Interventional Radiology Resident on-call.  YOU OR YOUR CAREGIVER SHOULD CALL 911 FOR ANY SEVERE SYMPTOMS SUCH AS EXCESSIVE BLEEDING, SEVERE DIZZINESS, TROUBLE BREATHING OR LOSS OF CONSCIOUSNESS.  ?  ?        Discharge Instructions for?Paracentesis  Paracentesis is a procedure to remove extra fluid from your belly (abdomen). This fluid buildup in the abdomen is called?ascites. The procedure may have  been done to take a sample of the fluid. Or, it may have been done to drain the extra fluid from your abdomen?and help make you more comfortable.      Ascites is buildup of excess fluid in the abdomen.   Home care  ? If you have pain after the procedure, your healthcare provider can prescribe or recommend pain medicines. Take these exactly as directed. If you stopped taking other medicines before the procedure, ask your provider when you can start them again.  ? Take it easy for 24 hours after the procedure. Don't do any physical activity until your provider says it?s OK.  ? You will have a small bandage over the puncture site. Stitches, surgical staples, adhesive tapes, adhesive strips, or surgical glue may be used to close the incision. They also help stop bleeding and speed healing. You may take the bandage off in 24 hours.  ? Check the puncture site for the signs of infection listed below.    Follow-up care  Make a follow-up appointment with your healthcare provider as directed. During your follow-up visit, your provider will check your healing. Let your provider know how you are feeling. You can also discuss the cause of your ascites and if you need any further treatment. If your fluid is infected, you will be sent home on antibiotics. In some cases, the paracentesis may need to be repeated if the fluid returns. Your provider may also prescribe medicines that increase urination (diuretics) to decrease the buildup of fluid.   When to call your healthcare provider  Call your healthcare provider if you have any of the following after the procedure:  ? A fever of 100.4? F ( 38.0?C) or higher, or as directed by your provider  ? Chills  ? Trouble breathing  ? Pain that doesn't go away even after taking pain medicine  ? Belly pain not caused by having the skin punctured  ? Bleeding from the puncture site  ? More than a small amount of fluid leaking from the puncture site  ? Swollen belly  ? Signs of infection at the puncture site. These include increased pain, redness, or swelling, warmth, or bad-smelling drainage.  ? Blood in your urine  ? Feeling dizzy or lightheaded, or fainting  [employer] last reviewed this educational content on 08/19/2018  ? 2000-2020 [employer], [place]. All rights reserved. This information is not intended as a substitute for professional medical care. Always follow your healthcare professional's instructions.

## 2020-01-15 NOTE — H&P (View-Only)
IR Pre-Procedure History and Physical/Sedation Plan    Procedure Date: 01/15/2020     Planned Procedure(s):  Ultrasound-guided paracentesis     Indication:  Fluid testing; Therapeutic drainage  __________________________________________________________________    Chief Complaint:  Ascites    History of Present Illness: Kristin Moody is a 64 y.o. female with a history as listed below who presents today for procedure.    Patient Active Problem List    Diagnosis Date Noted   ? 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 Anemia 09/28/2019   ? Encephalopathy 08/12/2019   ? Spasticity 08/10/2019   ? Hyperreflexia 08/10/2019   ? 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 Right leg weakness 05/14/2019   ? 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 Iron (Fe) deficiency anemia 01/19/2019   ? Preop cardiovascular exam 01/07/2019   ? Pre-transplant evaluation for liver transplant 01/06/2019   ? Cirrhosis (HCC) 12/16/2018   ? Acute on chronic anemia 12/16/2018   ? Lactic acidosis 12/16/2018   ? GI bleed 10/24/2018   ? Portal hypertension (HCC) 08/28/2018   ? Confusion 08/27/2018   ? Dyspnea 08/27/2018   ? Chronic abdominal pain 08/27/2018   ? 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 Acute on chronic blood loss anemia 08/27/2018   ? Iron deficiency anemia 04/13/2018   ? Pneumonia due to infectious organism 04/13/2018   ? Melena 04/10/2018   ? Esophageal varices without bleeding (HCC) 02/25/2018   ? Cirrhosis of liver without ascites (HCC) 02/25/2018   ? 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 Lower abdominal pain 10/24/2014   ? Chest discomfort 09/21/2014   ? Essential hypertension 08/13/2012   ? Abnormal liver function tests 05/13/2011   ? Fatty liver disease, nonalcoholic 05/13/2011   ? Obesity (BMI 30-39.9) 05/13/2011   ? Slow transit constipation 05/13/2011     Medical History:   Diagnosis Date   ? Aneurysm (HCC)    ? 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 Diverticulosis of colon     descending and sigmoid colon   ? Dyspnea    ? Essential hypertension 08/13/2012   ? Fatty infiltration of liver    ? HTN (hypertension)    ? Obesity (BMI 30-39.9) 05/13/2011   ? Preop cardiovascular exam 01/07/2019   ? Sinus infection    ? Type 2 diabetes mellitus (HCC) 09/21/2014      Surgical History:   Procedure Laterality Date   ? HYSTERECTOMY  1994   ? UPPER GASTROINTESTINAL ENDOSCOPY  2009   ? COLONOSCOPY  2009   ? CYSTOCELE REPAIR  2009    with endocele repair   ? RECTOCELE REPAIR  03/2009   ? LIVER BIOPSY  07/17/2010   ? COLONOSCOPY N/A 11/08/2017    Performed by Dawna Part, MD at [facility] ENDO   ? ESOPHAGOGASTRODUODENOSCOPY N/A 11/08/2017    Performed by Dawna Part, MD at [facility] ENDO   ? ESOPHAGOGASTRODUODENOSCOPY WITH BIOPSY - FLEXIBLE  11/08/2017    Performed by Dawna Part, MD at [facility] ENDO   ? COLONOSCOPY WITH HOT BIOPSY FORCEPS REMOVAL TUMOR/ POLYP/ OTHER LESION  11/08/2017    Performed by Dawna Part, MD at [facility] ENDO   ? ESOPHAGOGASTRODUODENOSCOPY WITH BAND LIGATION ESOPHAGEAL/ GASTRIC VARICES - FLEXIBLE N/A 04/10/2018    Performed by Normajean Baxter, MD at [facility] ENDO   ? ESOPHAGOGASTRODUODENOSCOPY WITH BIOPSY - FLEXIBLE N/A 04/10/2018  Performed by Normajean Baxter, MD at [facility] ENDO   ? ESOPHAGOGASTRODUODENOSCOPY WITH CONTROL OF BLEEDING - FLEXIBLE N/A 04/25/2018    Performed by Jolee Ewing, MD at [facility] ENDO   ? ESOPHAGOGASTRODUODENOSCOPY WITH BIOPSY - FLEXIBLE with push enteroscopy N/A 08/28/2018    Performed by Celesta Gentile, MD at [facility] ENDO   ? EGD N/A 10/27/2018    Performed by Eliott Nine, MD at [facility] ENDO   ? ESOPHAGOGASTRODUODENOSCOPY WITH SNARE REMOVAL TUMOR/ POLYP/ OTHER LESION - FLEXIBLE N/A 10/27/2018    Performed by Eliott Nine, MD at [facility] ENDO   ? ESOPHAGOGASTRODUODENOSCOPY WITH BIOPSY - FLEXIBLE N/A 12/16/2018    Performed by Buckles, Vinnie Level, MD at [facility] ENDO   ? ESOPHAGOGASTRODUODENOSCOPY WITH CONTROL OF BLEEDING - FLEXIBLE N/A 12/16/2018    Performed by Buckles, Vinnie Level, MD at [facility] ENDO   ? ESOPHAGOGASTRODUODENOSCOPY WITH SPECIMEN COLLECTION BY BRUSHING/ WASHING N/A 01/22/2019    Performed by Veneta Penton, MD at [facility] ENDO   ? ESOPHAGOGASTRODUODENOSCOPY WITH DILATION ESOPHAGUS WITH BALLOON 30 MM OR GREATER - FLEXIBLE N/A 01/22/2019    Performed by Veneta Penton, MD at [facility] ENDO   ? ESOPHAGOGASTRODUODENOSCOPY WITH BIOPSY - FLEXIBLE N/A 01/22/2019    Performed by Veneta Penton, MD at [facility] ENDO   ? ESOPHAGOGASTRODUODENOSCOPY WITH BIOPSY - FLEXIBLE N/A 06/19/2019    Performed by Dawna Part, MD at [facility] ENDO   ? ESOPHAGOGASTRODUODENOSCOPY [WITH APC]  WITH SPECIMEN COLLECTION BY BRUSHING/ WASHING N/A 08/07/2019    Performed by Dawna Part, MD at [facility] ENDO   ? ESOPHAGOGASTRODUODENOSCOPY WITH SPECIMEN COLLECTION BY BRUSHING/ WASHING N/A 09/10/2019    Performed by Buckles, Vinnie Level, MD at [facility] ENDO   ? ESOPHAGOGASTRODUODENOSCOPY WITH CONTROL OF BLEEDING - FLEXIBLE N/A 09/10/2019    Performed by Buckles, Vinnie Level, MD at [facility] ENDO   ? CHOLECYSTECTOMY     ? COLONOSCOPY     ? ESOPHAGOGASTRIC FUNDOPLICATION  2003, 2004    laparoscopic   ? ESOPHAGOGASTRIC FUNDOPLICATION     ? HX HYSTERECTOMY     ? PR ESOPHAGOSCOPY FLEXIBLE TRANSORAL DIAGNOSTIC        Social History     Tobacco Use   ? Smoking status: Never Smoker   ? Smokeless tobacco: Never Used   Substance Use Topics   ? Alcohol use: No      Family History   Problem Relation Age of Onset   ? Other Mother    ? Kidney Failure Mother    ? Osteoporosis Mother    ? Cancer Father         esophageal   ? Liver Disease Sister         endstage, s/p liver transplant   ? Cancer Brother         tonsil, liver    ? Hip Fracture Neg Hx       Medications Prior to Admission   Medication Sig Dispense Refill Last Dose   ? atorvastatin (LIPITOR) 10 mg tablet Take one tablet by mouth daily. 90 tablet 3 01/15/2020   ? cetirizine (ZYRTEC) 10 mg tablet Take 10 mg by mouth daily as needed for Allergy symptoms.   01/15/2020   ? ciprofloxacin (CIPRO) 500 mg tablet On 02/19 take 1 tablet in the evening. On 02/20 & 02/21 take 1 tablet in the morning and 1 tablet in the evening. Take 1 tablet in the morning starting 02/22. 32 tablet 3 01/15/2020   ? duloxetine DR (CYMBALTA) 60 mg  capsule Take 60 mg by mouth daily.   01/15/2020   ? furosemide (LASIX) 20 mg tablet Take one tablet by mouth every morning. 270 tablet 1 01/15/2020   ? pantoprazole DR (PROTONIX) 40 mg tablet TAKE 1 TABLET BY MOUTH TWICE A DAY 180 tablet 1 01/15/2020   ? polyethylene glycol 3350 (MIRALAX) 17 g packet Take one packet by mouth twice daily.      ? promethazine (PHENERGAN) 25 mg tablet Take 25 mg by mouth every 4 hours as needed. Just uses it with Tramadol   01/15/2020   ? rifAXIMin (XIFAXAN) 550 mg tablet Take one tablet by mouth twice daily. 180 tablet 3 01/15/2020   ? traMADoL (ULTRAM) 50 mg tablet Take 1 tablet by mouth as Needed.   01/15/2020   ? zinc sulfate 220 mg (50 mg elemental zinc) capsule Take one capsule by mouth daily. 90 capsule 3 01/15/2020     Allergies   Allergen Reactions   ? Tegaderm BLISTERS   ? Adhesive Tape (Rosins) RASH   ? Contrast Dye Iv, Iodine Containing [Iodinated Contrast Media] RASH   ? Sulfa (Sulfonamide Antibiotics) RASH   ? Codeine NAUSEA AND VOMITING   ? Morphine SEE COMMENTS     Pt reports burns her veins   ? Penicillin G SEE COMMENTS     Throat issues, blisters in throat    ? Tramadol NAUSEA ONLY and ITCHING     Takes this medication regularly        Review of Systems  Constitutional: negative for fevers and chills  Respiratory: negative  Cardiovascular: negative  Gastrointestinal: positive for abdominal pain, negative for nausea and vomiting    Physical Exam:  Vital Signs: Last Filed In 24 Hours Vital Signs: 24 Hour Range   BP: 113/72 (02/26 1115)  Temp: 36.4 ?C (97.5 ?F) (02/26 1115)  Pulse: 93 (02/26 1115)  Respirations: 19 PER MINUTE (02/26 1115)  SpO2: 86 % (02/26 1115)  SpO2 Pulse: 91 (02/26 1115) BP: (113)/(72)   Temp:  [36.4 ?C (97.5 ?F)]   Pulse:  [93] Respirations:  [19 PER MINUTE]   SpO2:  [86 %]    Intensity Pain Scale (Self Report): 5 (01/15/20 1115)      General appearance: alert and no distress noted.  Neurologic: Grossly normal.  Lungs: Non labored.  Heart: regular rate and rhythm  Abdomen: distended, tender    Pre-procedure anxiolysis plan: N/A  Sedation/Medication Plan: Local anesthetic  Personal history of sedation complications: Denies adverse event.   Family history of sedation complications: Denies adverse event.   Medications for Reversal: NA  Discussion/Reviews:  Physician has discussed risks and alternatives of this type of sedation and above planned procedures with patient    NPO Status: NA  Airway:  NA  Head and Neck: NA  Mouth: NA   Anesthesia Classification:  ASA III (A patient with a severe systemic disease that limits activity, but is not incapacitating)  Pregnancy Status: Not Pregnant    Lab/Radiology/Other Diagnostic Tests:  Labs:  Pertinent labs reviewed           Dollene Primrose, APRN-NP  Pager [phone]

## 2020-01-15 NOTE — Other
Immediate Post Procedure Note    Date:  01/15/2020                                         Attending Physician:   Tina Griffiths  Performing Provider:  Rhunette Croft, MD    Consent:  Consent obtained from patient.  Time out performed: Consent obtained, correct patient verified, correct procedure verified, correct site verified, patient marked as necessary.  Pre/Post Procedure Diagnosis:  Ascites  Indications:  Ascites    Anesthesia: Local lidocaine  Procedure(s):  Paracentesis  Findings:  Slightly yellow fluid obtained     Estimated Blood Loss:  None/Negligible  Specimen(s) Removed/Disposition:  Yes, sent to pathology  Complications: None  Patient Tolerated Procedure: Well  Post-Procedure Condition:  stable    Rhunette Croft, MD  Pager [phone]

## 2020-01-20 ENCOUNTER — Encounter: Admit: 2020-01-20 | Discharge: 2020-01-20 | Payer: MEDICARE

## 2020-01-20 NOTE — Telephone Encounter
Patient called requesting a call back regarding medications.

## 2020-01-21 ENCOUNTER — Encounter: Admit: 2020-01-21 | Discharge: 2020-01-21 | Payer: MEDICARE

## 2020-01-21 MED ORDER — PANTOPRAZOLE 40 MG PO TBEC
40 mg | ORAL_TABLET | Freq: Two times a day (BID) | ORAL | 3 refills | 90.00000 days | Status: DC
Start: 2020-01-21 — End: 2020-04-26

## 2020-01-21 MED ORDER — FUROSEMIDE 20 MG PO TAB
40 mg | ORAL_TABLET | Freq: Every morning | ORAL | 1 refills | 90.00000 days | Status: DC
Start: 2020-01-21 — End: 2020-02-26

## 2020-01-21 MED ORDER — SPIRONOLACTONE 50 MG PO TAB
50 mg | ORAL_TABLET | Freq: Every day | ORAL | 5 refills | 90.00000 days | Status: DC
Start: 2020-01-21 — End: 2020-02-05

## 2020-01-21 NOTE — Progress Notes
Interventional Radiology Outpatient Scheduling Checklist      1.  Name of Procedure(s):   Paracentesis      2.  Date of Procedure:   01/22/20      3.  Arrival Time:   1100      4.  Procedure Time:  9470      9.  Correct Procedural Room Assignment:  BH8    6.  Blood Thinners Triaged and instructed per protocol: Y/N/NA:  NA  Confirmed accurate instructions sent to patient: Y/N:  NA       7.  Procedure Order Verified: Y/N:  Yes      9.  Patient instructed to have a driver: Y/N/NA:  Yes    10.  Patient instructed on NPO status: Y/N/NA:  No dietary restrictions  Confirmed accurate instructions sent to patient: Y/N:  Yes    11.  Specimen needed: Y/N/NA:  Yes   Verified Order placed: Y/N:  Yes    12.  Allergies Verified:  Y/N:  Yes    13.  Is there an Iodine Allergy: Y/N:  No  Does the Procedure Require contrast: Y/N:  No  If so, was the IR- Contrast Allergy Pre-Procedure Medication protocol ordered: Y/NA:  NA    14.  Does the patient have labs according to IR Pre-procedure Laboratory Parameter policy: Y/N/NA:  Yes  If No, was the patient instructed to obtain labs prior to procedure: Y/N/NA:  NA     15.  Will the patient need to be admitted or have a possible admission: Y/N:  No  If yes, confirmed accurate instructions sent to patient: Y/N/NA:  NA     16.  Patient States Understanding: Y/N:  Yes    17.  History of OSA:  Y/N:  No  If yes, confirm request to bring CPAP sent to patient: Y/N/NA:  NA    18. Patient declines electronic procedure instructions: Y/N:  No

## 2020-01-21 NOTE — Patient Education
Dear Ms. Kristin Moody,         Thank you for choosing [facility] Interventional Radiology for your procedure. Your appointment information is listed below:    Appointment Date: 01/22/20  Appointment Time: 12:00pm  Arrival Time:11:00am  Location:     ? [place]: [address] [place], [place]  [address]  Parking: P3 Parking [place] are scheduled for a procedure in Interventional Radiology.  Please follow these instructions and any direction from your Primary Care/Managing Physician.  If you have questions about your procedure or need to reschedule please call [phone].    Medication Instructions:   You may take the following medications with a small sip of water:  Continue your regular medications as directed.     Diet Instructions: No dietary restrictions   Day of Exam Instructions:  1. Bathe or shower with an antibacterial soap prior to your appointment.  2. Bring a list of your current medications and the dosages.  3. Wear comfortable clothing and leave valuables at home.  4. Arrive 1 hour prior to your appointment.  This time will be spent registering, interviewing, assessing, educating and preparing you for the test.  ? You will be with [place] anywhere from 30 minutes to 6 hours after your exam depending on your procedure.    Interventional Radiology Team  Perioperative and Procedural Scheduling Department  [facility] [place] [facility]  Ph: [phone]      Cristopher Estimable BSN, RN

## 2020-01-22 ENCOUNTER — Encounter: Admit: 2020-01-22 | Discharge: 2020-01-22 | Payer: MEDICARE

## 2020-01-22 ENCOUNTER — Ambulatory Visit: Admit: 2020-01-22 | Discharge: 2020-01-23 | Payer: MEDICARE

## 2020-01-22 ENCOUNTER — Ambulatory Visit: Admit: 2020-01-22 | Discharge: 2020-01-22 | Payer: MEDICARE

## 2020-01-22 DIAGNOSIS — K5730 Diverticulosis of large intestine without perforation or abscess without bleeding: Secondary | ICD-10-CM

## 2020-01-22 DIAGNOSIS — K760 Fatty (change of) liver, not elsewhere classified: Secondary | ICD-10-CM

## 2020-01-22 DIAGNOSIS — R0600 Dyspnea, unspecified: Secondary | ICD-10-CM

## 2020-01-22 DIAGNOSIS — E119 Type 2 diabetes mellitus without complications: Secondary | ICD-10-CM

## 2020-01-22 DIAGNOSIS — R188 Other ascites: Secondary | ICD-10-CM

## 2020-01-22 DIAGNOSIS — J329 Chronic sinusitis, unspecified: Secondary | ICD-10-CM

## 2020-01-22 DIAGNOSIS — I10 Essential (primary) hypertension: Secondary | ICD-10-CM

## 2020-01-22 DIAGNOSIS — K31819 Angiodysplasia of stomach and duodenum without bleeding: Secondary | ICD-10-CM

## 2020-01-22 DIAGNOSIS — K7469 Other cirrhosis of liver: Secondary | ICD-10-CM

## 2020-01-22 DIAGNOSIS — K7030 Alcoholic cirrhosis of liver without ascites: Secondary | ICD-10-CM

## 2020-01-22 DIAGNOSIS — K7460 Unspecified cirrhosis of liver: Secondary | ICD-10-CM

## 2020-01-22 DIAGNOSIS — K7290 Hepatic failure, unspecified without coma: Secondary | ICD-10-CM

## 2020-01-22 DIAGNOSIS — D500 Iron deficiency anemia secondary to blood loss (chronic): Secondary | ICD-10-CM

## 2020-01-22 DIAGNOSIS — Z01810 Encounter for preprocedural cardiovascular examination: Secondary | ICD-10-CM

## 2020-01-22 DIAGNOSIS — Z713 Dietary counseling and surveillance: Secondary | ICD-10-CM

## 2020-01-22 DIAGNOSIS — I729 Aneurysm of unspecified site: Secondary | ICD-10-CM

## 2020-01-22 DIAGNOSIS — E669 Obesity, unspecified: Secondary | ICD-10-CM

## 2020-01-22 LAB — COMPREHENSIVE METABOLIC PANEL
Lab: 1.1 mg/dL — ABNORMAL HIGH (ref 0.4–1.00)
Lab: 1.2 mg/dL (ref 0.3–1.2)
Lab: 101 MMOL/L — ABNORMAL LOW (ref 98–110)
Lab: 131 mg/dL — ABNORMAL HIGH (ref 70–100)
Lab: 136 MMOL/L — ABNORMAL LOW (ref 137–147)
Lab: 172 U/L — ABNORMAL HIGH (ref 25–110)
Lab: 2.9 g/dL — ABNORMAL LOW (ref 3.5–5.0)
Lab: 21 U/L (ref 7–56)
Lab: 30 mg/dL — ABNORMAL HIGH (ref 7–25)
Lab: 5.8 g/dL — ABNORMAL LOW (ref 6.0–8.0)
Lab: 50 mL/min — ABNORMAL LOW (ref >60)
Lab: 8.6 mg/dL — ABNORMAL HIGH (ref 8.5–10.6)
Lab: 9 K/UL (ref 3–12)
Lab: >60 mL/min (ref >60)

## 2020-01-22 LAB — PROTIME INR (PT): Lab: 1.3 MMOL/L — ABNORMAL HIGH (ref 0.8–1.2)

## 2020-01-22 LAB — CBC AND DIFF
Lab: 0.1 10*3/uL (ref 0–0.20)
Lab: 0.1 10*3/uL (ref 0–0.45)
Lab: 6.8 10*3/uL (ref 4.5–11.0)

## 2020-01-22 LAB — GRAM STAIN

## 2020-01-22 MED ORDER — ALBUMIN, HUMAN 25 % IV SOLP
0 refills | Status: CP
  Administered 2020-01-22: 18:00:00 12.5 g via INTRAVENOUS
  Administered 2020-01-22: 18:00:00 25 g via INTRAVENOUS

## 2020-01-22 MED ORDER — DIPHENHYDRAMINE HCL 50 MG/ML IJ SOLN
50 mg | Freq: Once | INTRAVENOUS | 0 refills | Status: DC
Start: 2020-01-22 — End: 2020-01-22

## 2020-01-22 MED ORDER — HYDROCORTISONE SOD SUCC (PF) 100 MG/2 ML IJ SOLR
100 mg | Freq: Once | INTRAVENOUS | 0 refills | Status: DC
Start: 2020-01-22 — End: 2020-01-22

## 2020-01-22 NOTE — Patient Instructions
Discharge Instructions for?Paracentesis  Paracentesis is a procedure to remove extra fluid from your belly (abdomen). This fluid buildup in the abdomen is called?ascites. The procedure may have been done to take a sample of the fluid. Or, it may have been done to drain the extra fluid from your abdomen?and help make you more comfortable.      Ascites is buildup of excess fluid in the abdomen.   Home care  ? If you have pain after the procedure, your healthcare provider can prescribe or recommend pain medicines. Take these exactly as directed. If you stopped taking other medicines before the procedure, ask your provider when you can start them again.  ? Take it easy for 24 hours after the procedure. Don't do any physical activity until your provider says it?s OK.  ? You will have a small bandage over the puncture site. Stitches, surgical staples, adhesive tapes, adhesive strips, or surgical glue may be used to close the incision. They also help stop bleeding and speed healing. You may take the bandage off in 24 hours.  ? Check the puncture site for the signs of infection listed below.    Follow-up care  Make a follow-up appointment with your healthcare provider as directed. During your follow-up visit, your provider will check your healing. Let your provider know how you are feeling. You can also discuss the cause of your ascites and if you need any further treatment. If your fluid is infected, you will be sent home on antibiotics. In some cases, the paracentesis may need to be repeated if the fluid returns. Your provider may also prescribe medicines that increase urination (diuretics) to decrease the buildup of fluid.   When to call your healthcare provider  Call your healthcare provider if you have any of the following after the procedure:  ? A fever of 100.4? F ( 38.0?C) or higher, or as directed by your provider  ? Chills  ? Trouble breathing  ? Pain that doesn't go away even after taking pain medicine  ? Belly pain not caused by having the skin punctured  ? Bleeding from the puncture site  ? More than a small amount of fluid leaking from the puncture site  ? Swollen belly  ? Signs of infection at the puncture site. These include increased pain, redness, or swelling, warmth, or bad-smelling drainage.  ? Blood in your urine  ? Feeling dizzy or lightheaded, or fainting  [employer] last reviewed this educational content on 08/19/2018  ? 2000-2020 [employer] [place]. All rights reserved. This information is not intended as a substitute for professional medical care. Always follow your healthcare professional's instructions.

## 2020-01-22 NOTE — H&P (View-Only)
Pre Procedure History and Physical/Sedation Plan-OP    Procedure Date: 01/22/2020     Planned Procedure(s):  US guided paracentesis     Indication for exam:  Fluid testing and therapeutic drainage  __________________________________________________________________    Chief Complaint:  Ascites    History of Present Illness: Kristin Moody is a 64 y.o. 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 Patient complains of abdominal pain and back pain.    Patient Active Problem List    Diagnosis Date Noted   ? 'light-for-dates' infant with signs of fetal malnutrition 01/22/2020   ? 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 Anemia 09/28/2019   ? Encephalopathy 08/12/2019   ? Spasticity 08/10/2019   ? Hyperreflexia 08/10/2019   ? 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 Right leg weakness 05/14/2019   ? 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 Iron (Fe) deficiency anemia 01/19/2019   ? Preop cardiovascular exam 01/07/2019   ? Pre-transplant evaluation for liver transplant 01/06/2019   ? Cirrhosis (HCC) 12/16/2018   ? Acute on chronic anemia 12/16/2018   ? Lactic acidosis 12/16/2018   ? GI bleed 10/24/2018   ? Portal hypertension (HCC) 08/28/2018   ? Confusion 08/27/2018   ? Dyspnea 08/27/2018   ? Chronic abdominal pain 08/27/2018   ? 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 Acute on chronic blood loss anemia 08/27/2018   ? Iron deficiency anemia 04/13/2018   ? Pneumonia due to infectious organism 04/13/2018   ? Melena 04/10/2018   ? Esophageal varices without bleeding (HCC) 02/25/2018   ? Cirrhosis of liver without ascites (HCC) 02/25/2018   ? 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 Lower abdominal pain 10/24/2014   ? Chest discomfort 09/21/2014   ? Essential hypertension 08/13/2012   ? Abnormal liver function tests 05/13/2011   ? Fatty liver disease, nonalcoholic 05/13/2011   ? Obesity (BMI 30-39.9) 05/13/2011   ? Slow transit constipation 05/13/2011     Medical History:   Diagnosis Date   ? Aneurysm (HCC)    ? 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 Diverticulosis of colon     descending and sigmoid colon   ? Dyspnea    ? Essential hypertension 08/13/2012   ? Fatty infiltration of liver    ? HTN (hypertension)    ? Obesity (BMI 30-39.9) 05/13/2011   ? Preop cardiovascular exam 01/07/2019   ? Sinus infection    ? Type 2 diabetes mellitus (HCC) 09/21/2014      Surgical History:   Procedure Laterality Date   ? HYSTERECTOMY  1994   ? UPPER GASTROINTESTINAL ENDOSCOPY  2009   ? COLONOSCOPY  2009   ? CYSTOCELE REPAIR  2009    with endocele repair   ? RECTOCELE REPAIR  03/2009   ? LIVER BIOPSY  07/17/2010   ? COLONOSCOPY N/A 11/08/2017    Performed by Dawna Part, MD at [facility] ENDO   ? ESOPHAGOGASTRODUODENOSCOPY N/A 11/08/2017    Performed by Dawna Part, MD at [facility] ENDO   ? ESOPHAGOGASTRODUODENOSCOPY WITH BIOPSY - FLEXIBLE  11/08/2017    Performed by Dawna Part, MD at [facility] ENDO   ? COLONOSCOPY WITH HOT BIOPSY FORCEPS REMOVAL TUMOR/ POLYP/ OTHER LESION  11/08/2017    Performed by Dawna Part, MD at [facility] ENDO   ? ESOPHAGOGASTRODUODENOSCOPY WITH BAND LIGATION ESOPHAGEAL/ GASTRIC VARICES - FLEXIBLE N/A 04/10/2018  Performed by Normajean Baxter, MD at [facility] ENDO   ? ESOPHAGOGASTRODUODENOSCOPY WITH BIOPSY - FLEXIBLE N/A 04/10/2018    Performed by Normajean Baxter, MD at [facility] ENDO   ? ESOPHAGOGASTRODUODENOSCOPY WITH CONTROL OF BLEEDING - FLEXIBLE N/A 04/25/2018    Performed by Jolee Ewing, MD at [facility] ENDO   ? ESOPHAGOGASTRODUODENOSCOPY WITH BIOPSY - FLEXIBLE with push enteroscopy N/A 08/28/2018    Performed by Celesta Gentile, MD at [facility] ENDO   ? EGD N/A 10/27/2018    Performed by Eliott Nine, MD at [facility] ENDO   ? ESOPHAGOGASTRODUODENOSCOPY WITH SNARE REMOVAL TUMOR/ POLYP/ OTHER LESION - FLEXIBLE N/A 10/27/2018    Performed by Eliott Nine, MD at [facility] ENDO   ? ESOPHAGOGASTRODUODENOSCOPY WITH BIOPSY - FLEXIBLE N/A 12/16/2018    Performed by Buckles, Vinnie Level, MD at [facility] ENDO   ? ESOPHAGOGASTRODUODENOSCOPY WITH CONTROL OF BLEEDING - FLEXIBLE N/A 12/16/2018    Performed by Buckles, Vinnie Level, MD at [facility] ENDO   ? ESOPHAGOGASTRODUODENOSCOPY WITH SPECIMEN COLLECTION BY BRUSHING/ WASHING N/A 01/22/2019    Performed by Veneta Penton, MD at [facility] ENDO   ? ESOPHAGOGASTRODUODENOSCOPY WITH DILATION ESOPHAGUS WITH BALLOON 30 MM OR GREATER - FLEXIBLE N/A 01/22/2019    Performed by Veneta Penton, MD at [facility] ENDO   ? ESOPHAGOGASTRODUODENOSCOPY WITH BIOPSY - FLEXIBLE N/A 01/22/2019    Performed by Veneta Penton, MD at [facility] ENDO   ? ESOPHAGOGASTRODUODENOSCOPY WITH BIOPSY - FLEXIBLE N/A 06/19/2019    Performed by Dawna Part, MD at [facility] ENDO   ? ESOPHAGOGASTRODUODENOSCOPY [WITH APC]  WITH SPECIMEN COLLECTION BY BRUSHING/ WASHING N/A 08/07/2019    Performed by Dawna Part, MD at [facility] ENDO   ? ESOPHAGOGASTRODUODENOSCOPY WITH SPECIMEN COLLECTION BY BRUSHING/ WASHING N/A 09/10/2019    Performed by Buckles, Vinnie Level, MD at [facility] ENDO   ? ESOPHAGOGASTRODUODENOSCOPY WITH CONTROL OF BLEEDING - FLEXIBLE N/A 09/10/2019    Performed by Buckles, Vinnie Level, MD at [facility] ENDO   ? CHOLECYSTECTOMY     ? COLONOSCOPY     ? ESOPHAGOGASTRIC FUNDOPLICATION  2003, 2004    laparoscopic   ? ESOPHAGOGASTRIC FUNDOPLICATION     ? HX HYSTERECTOMY     ? PR ESOPHAGOSCOPY FLEXIBLE TRANSORAL DIAGNOSTIC        Medications Prior to Admission   Medication Sig Dispense Refill Last Dose   ? atorvastatin (LIPITOR) 10 mg tablet Take one tablet by mouth daily. 90 tablet 3    ? cetirizine (ZYRTEC) 10 mg tablet Take 10 mg by mouth daily as needed for Allergy symptoms.      ? ciprofloxacin (CIPRO) 500 mg tablet On 02/19 take 1 tablet in the evening. On 02/20 & 02/21 take 1 tablet in the morning and 1 tablet in the evening. Take 1 tablet in the morning starting 02/22. 32 tablet 3    ? duloxetine DR (CYMBALTA) 60 mg capsule Take 60 mg by mouth daily.      ? furosemide (LASIX) 20 mg tablet Take two tablets by mouth every morning. 270 tablet 1    ? pantoprazole DR (PROTONIX) 40 mg tablet Take one tablet by mouth twice daily. 60 tablet 3    ? polyethylene glycol 3350 (MIRALAX) 17 g packet Take one packet by mouth twice daily.      ? promethazine (PHENERGAN) 25 mg tablet Take 25 mg by mouth every 4 hours as needed. Just uses it with Tramadol      ? rifAXIMin (XIFAXAN) 550 mg tablet Take one tablet  by mouth twice daily. 180 tablet 3    ? spironolactone (ALDACTONE) 50 mg tablet Take one tablet by mouth daily. Take with food. 30 tablet 5    ? traMADoL (ULTRAM) 50 mg tablet Take 1 tablet by mouth as Needed.      ? zinc sulfate 220 mg (50 mg elemental zinc) capsule Take one capsule by mouth daily. 90 capsule 3      Allergies   Allergen Reactions   ? Tegaderm BLISTERS   ? Adhesive Tape (Rosins) RASH   ? Contrast Dye Iv, Iodine Containing [Iodinated Contrast Media] RASH   ? Sulfa (Sulfonamide Antibiotics) RASH   ? Codeine NAUSEA AND VOMITING   ? Morphine SEE COMMENTS     Pt reports burns her veins   ? Penicillin G SEE COMMENTS     Throat issues, blisters in throat    ? Tramadol NAUSEA ONLY and ITCHING     Takes this medication regularly        Social History:   Social History     Tobacco Use   ? Smoking status: Never Smoker   ? Smokeless tobacco: Never Used   Substance Use Topics   ? Alcohol use: No      Family History   Problem Relation Age of Onset   ? Other Mother    ? Kidney Failure Mother    ? Osteoporosis Mother    ? Cancer Father         esophageal   ? Liver Disease Sister         endstage, s/p liver transplant   ? Cancer Brother         tonsil, liver    ? Hip Fracture Neg Hx         Review of Systems  Constitutional: negative for fevers  Respiratory: negative for cough or increased work of breathing  Cardiovascular: negative for chest pain  Gastrointestinal: positive for abdominal pain, negative for nausea and vomiting   Musculoskeletal: positive for back pain    Previous Anesthetic/Sedation History:  Reviewed    Physical Exam:  Vital Signs: Last Filed In 24 Hours Vital Signs: 24 Hour Range                  General appearance: alert and no distress  Neurologic: Grossly normal, at baseline  Lungs: Nonlabored with normal effort  Abdomen: soft, non-tender.   Extremities: extremities normal, atraumatic, no cyanosis or edema    Sedation/Medication Plan: Lidocaine  Personal history of sedation complications: Denies adverse event.   Family history of sedation complications: Denies adverse event.   Medications for Reversal: None  Discussion/Reviews:  Physician has discussed risks and alternatives of this type of sedation and above planned procedures with patient  NPO Status: Acceptable  Pregnancy Status: N/A        Lab/Radiology/Other Diagnostic Tests:  Labs:  Pertinent labs reviewed           Samson Frederic, APRN-NP  Pager [phone]

## 2020-01-22 NOTE — Progress Notes
Pt arrived to IR pre/post for procedure work up.     Mobility status: Ambulatory  Procedure and discharge instructions reviewed and pt verbalizes understanding.   Pt cart locked in low position, call light within reach.   See doc flowsheets for further details.    Will continue to monitor patient.

## 2020-01-22 NOTE — Progress Notes
Criteria met for discharge. VSS. Right abdomen Site clean, dry, and intact. Reviewed discharge information and no further questions. Patient relative to drive home. Transported to front lobby for get in personal vehicle to go home.

## 2020-01-22 NOTE — Progress Notes
Dr Saunders Glance. Office called and informed that patient may need medications ordered for pre treatment for CT exam for am 3/6/202. Patient states she thinks she may have some of the medicine at home. Informed patient that if she hasn't taken the prescribed medication from Dr Lovena Le office the CT may be cancelled, patient states understanding and will await to hear from that office for medication clarification.

## 2020-01-22 NOTE — Other
Immediate Post Procedure Note    Date:  01/22/2020                                         Attending Physician:   Leia Alf, MD  Performing Provider:  Rachel Bo, DO    Consent:  Consent obtained from patient.  Time out performed: Consent obtained, correct patient verified, correct procedure verified, correct site verified, patient marked as necessary.  Pre/Post Procedure Diagnosis:  NASH cirrhosis  Indications:  Ascites    Anesthesia: Local 10 mL 1% lidocaine without epinephrine  Procedure(s):  Paracentesis  Findings:  Successful paracentesis     Estimated Blood Loss:  None/Negligible  Specimen(s) Removed/Disposition:  Yes, sent to pathology  Complications: None  Patient Tolerated Procedure: Well  Post-Procedure Condition:  stable     , DO  Pager

## 2020-01-22 NOTE — Progress Notes
Patient provided with education in regards to planned procedure in IR today. Understanding verbalized; indicators for reinforcement not noted. Patient alert and oriented x 4, VSS, denies any discomfort. Call light within reach, bed in lowest/locked position; will continue to monitor.

## 2020-01-23 ENCOUNTER — Encounter: Admit: 2020-01-23 | Discharge: 2020-01-23 | Payer: MEDICARE

## 2020-01-23 ENCOUNTER — Ambulatory Visit: Admit: 2020-01-23 | Discharge: 2020-01-23 | Payer: MEDICARE

## 2020-01-23 DIAGNOSIS — K7460 Unspecified cirrhosis of liver: Secondary | ICD-10-CM

## 2020-01-23 MED ORDER — IOPAMIDOL 76 % IV SOLN
80 mL | Freq: Once | INTRAVENOUS | 0 refills | Status: CP
Start: 2020-01-23 — End: ?

## 2020-01-23 MED ORDER — SODIUM CHLORIDE 0.9 % IJ SOLN
50 mL | Freq: Once | INTRAVENOUS | 0 refills | Status: CP
Start: 2020-01-23 — End: ?

## 2020-01-23 NOTE — Progress Notes
3/2 labs still not received by [facility] called main lab, spoke to [facility] who will fax results from 3/2 today. Fax # given.

## 2020-01-25 ENCOUNTER — Encounter: Admit: 2020-01-25 | Discharge: 2020-01-25 | Payer: MEDICARE

## 2020-01-25 DIAGNOSIS — Z01810 Encounter for preprocedural cardiovascular examination: Secondary | ICD-10-CM

## 2020-01-25 DIAGNOSIS — E119 Type 2 diabetes mellitus without complications: Secondary | ICD-10-CM

## 2020-01-25 DIAGNOSIS — I10 Essential (primary) hypertension: Secondary | ICD-10-CM

## 2020-01-25 DIAGNOSIS — K5730 Diverticulosis of large intestine without perforation or abscess without bleeding: Secondary | ICD-10-CM

## 2020-01-25 DIAGNOSIS — J329 Chronic sinusitis, unspecified: Secondary | ICD-10-CM

## 2020-01-25 DIAGNOSIS — R0600 Dyspnea, unspecified: Secondary | ICD-10-CM

## 2020-01-25 DIAGNOSIS — K31819 Angiodysplasia of stomach and duodenum without bleeding: Secondary | ICD-10-CM

## 2020-01-25 DIAGNOSIS — E669 Obesity, unspecified: Secondary | ICD-10-CM

## 2020-01-25 DIAGNOSIS — K7460 Unspecified cirrhosis of liver: Secondary | ICD-10-CM

## 2020-01-25 DIAGNOSIS — K760 Fatty (change of) liver, not elsewhere classified: Secondary | ICD-10-CM

## 2020-01-25 DIAGNOSIS — R945 Abnormal results of liver function studies: Secondary | ICD-10-CM

## 2020-01-25 DIAGNOSIS — I729 Aneurysm of unspecified site: Secondary | ICD-10-CM

## 2020-01-25 LAB — COMPREHENSIVE METABOLIC PANEL
Lab: 1 mg/dL — ABNORMAL HIGH (ref 0.57–1.11)
Lab: 1.3 mg/dL — ABNORMAL HIGH (ref 0.2–1.2)
Lab: 13 meq/L — ABNORMAL LOW (ref 0–14)
Lab: 136 mmol/L — ABNORMAL LOW (ref 136–145)
Lab: 2.8 g/dL — ABNORMAL LOW (ref 3.4–4.8)
Lab: 207 U/L — ABNORMAL HIGH (ref 40–150)
Lab: 24 mmol/L (ref 23–31)
Lab: 25 U/L (ref 0–55)
Lab: 27 mg/dL — ABNORMAL HIGH (ref 9.8–20.1)
Lab: 5.8 g/dL — ABNORMAL LOW (ref 6.2–8.1)
Lab: 52 U/L — ABNORMAL HIGH (ref 5–34)
Lab: 56 mL/min/{1.73_m2} — AB (ref >59)
Lab: 8.5 mg/dL — ABNORMAL LOW (ref 8.4–10.2)

## 2020-01-25 LAB — PROTIME INR (PT)
Lab: 1.3 mmol/L — ABNORMAL LOW (ref 98–107)
Lab: 14 s — ABNORMAL HIGH (ref 9.9–12.6)

## 2020-01-25 LAB — CBC AND DIFF
Lab: 0.1 10*3/uL (ref 0.0–0.2)
Lab: 0.1 10*3/uL (ref 0.0–0.6)
Lab: 7.4 10*3/uL (ref 4.8–10.8)

## 2020-01-28 ENCOUNTER — Encounter: Admit: 2020-01-28 | Discharge: 2020-01-28 | Payer: MEDICARE

## 2020-01-28 NOTE — Telephone Encounter
BENEFIT COLLECTION: NO EVAL OR LISTING AUTH REQUIRED  DX: K74.60  Verified by: Margaretha Glassing           Date: 01/28/20                         Spoke to: N/A  Ins Plan:  MDCR A&B                      EFF:   04/20/11                   Phone: [phone]  Plan Type: Traditional   ID #: [id number]                    GR#:  N/A                           Subscriber: Self    In Network Transplant Benefits:   MEDICARE A&B - 2021 BENEFIT SUMMARY:  PART A: DAYS REFRESH AFTER 60 CONSECUTIVE OUTPT DAYS  DAYS 1-60 $1,484.00 DEDUCTIBLE  DAYS 61-90 $371.00/DAY COPAY  DAYS 91-150 (USING ANY LIFETIME RESERVE DAYS) $742.00/DAY COPAY  DAYS > 150 PT PORTION 100%; after a 3-day minimum medically necessary inpatient hospital stay for a related illness or injury.  PART A/ SNF BENEFITS: IN 2021: DAYS 1-20 PT PORTION $0; DAYS 21-100 $185.50/DAY COPAY; DAYS > 100 PT PORTION 100%  PART B IN 2021:  DEDUCTIBLE $203.00, WITH 80% REIMBURSEMENT OF ALLOWABLE, NO OUT OF POCKET MAXIMUM  MEDICARE WILL PAY FOR DRUGS INFUSED THROUGH AN ITEM OF DME, LIKE AN INFUSION PUMP, OR DRUGS GIVEN BY A NEBULIZER   WHEN GIVEN BY A LICENSED MEDICAL PROVIDER.  PART B WILL COVER IMMUNOSUPPRESSIVE DRUGS IF [facility] COVERED THE TRANSPLANT, EVEN AS SECONDARY PAYER  No Case Management, No transplant Network, No prior authorizations, No Transplant Max, No Travel and Lodging, No BDCT requirement.    RX Plan:   [employer] Value Script                                         Phone #:  [phone]  30day retail cost:  Tier 1 $5 - Tier 2 $10 - Tier 3 $47 - Tier 4 50% - Tier 5 25%  90day m/o cost:  Tier 1 $15 - Tier 2 $30 - Tier 3 $141 - Tier 4 50%  RX Ded: $445  RX OOP: Coverage gap in 2021:  In stage 1 pt pays as plan requires until total paid out = $4130.00  Then in Stage 2 coverage gap, pt pays 37% of cost of generics, 25% of cost of brand names until total paid out = $6550.00 (pt and Plan)  Patient responsibility in gap is approximately 50% of $2330 = $1165.00  In Stage 3 catastrophic pt will pay as plan requires (small copays or coinsurances)    Secondary:  Ins Plan:  [employer] [place]                     EFF: 04/20/2011                          Plan Type:  PLAN 65 SUP F  ID #: [id number]  GR#: [id number]                           Subscriber:SELF  CS PHONE# [phone]     Medical Benefits:  MEDICARE SUPPLEMENT PLAN F  COVERS PT A [facility]INSUR HOSPITAL COSTS UP TO AN ADDITIONAL 365 DAYS AFTER MEDICARE BENEFITS ARE USED UP,  MEDICARE PART B COINSURANCE OR COPAYMENT,  FIRST 3 PINTS OF BLOOD  PART A HOSPICE CARE COINSURANCE OR COPAYMENT  SKILLED NURSING FACILITY COINSURANCE  PART A DEDUCTIBLE  PART B DEDUCTIBLE  MEDICARE PREVENTIVE CARE PART B COINSURANCE  PART B EXCESS CH[facility],  FOREIGN TRAVEL EMERGENCY UP TO PLAN LIMITS  MEDICARE PREVENTIVE CARE PART B COINSURANCE  Lifetime Transplant Max Benefits:NO MAX  Travel and lodging for pt:NOT COVERED        Authorization Requirements: NO EVAL OR LISTING AUTH REQUIRED    Center Requirements:  BDCT: N/A    Travel/Lodging: None on Plan    DME (Durable Medical Equipment):     TXP Network: MDCR Transplant   NCM: N/A                                         Phone #:   N/A                                   FAX #:  N/A  Call Reference #: N/A

## 2020-01-29 ENCOUNTER — Encounter: Admit: 2020-01-29 | Discharge: 2020-01-29 | Payer: MEDICARE

## 2020-01-29 MED ORDER — ATORVASTATIN 10 MG PO TAB
ORAL_TABLET | Freq: Every day | 3 refills | Status: DC
Start: 2020-01-29 — End: 2020-03-25

## 2020-02-01 ENCOUNTER — Encounter: Admit: 2020-02-01 | Discharge: 2020-02-01 | Payer: MEDICARE

## 2020-02-01 DIAGNOSIS — R945 Abnormal results of liver function studies: Secondary | ICD-10-CM

## 2020-02-01 DIAGNOSIS — K31819 Angiodysplasia of stomach and duodenum without bleeding: Secondary | ICD-10-CM

## 2020-02-01 LAB — CBC AND DIFF
Lab: 0.1
Lab: 6.8

## 2020-02-01 LAB — COMPREHENSIVE METABOLIC PANEL
Lab: 50 — ABNORMAL LOW (ref >59)
Lab: 1.1 mg/dL
Lab: 1.1 mg/dL — ABNORMAL HIGH (ref 0.57–1.11)
Lab: 103 mmol/L
Lab: 12 meq/L — ABNORMAL LOW (ref 30.0–34.0)
Lab: 136 mmol/L — AB (ref 2.0–3.5)
Lab: 159 mg/dL — ABNORMAL HIGH (ref 70–105)
Lab: 204 U/L — ABNORMAL HIGH (ref 40–150)
Lab: 25 mmol/L — ABNORMAL LOW (ref 27.0–31.0)
Lab: 28 mg/dL — ABNORMAL HIGH (ref 9.8–20.1)
Lab: 31 U/L
Lab: 4.1 mmol/L — ABNORMAL LOW (ref 37.0–47.0)
Lab: 5.7 g/dL — ABNORMAL LOW (ref 6.2–8.1)
Lab: 55 U/L — ABNORMAL HIGH (ref 5–34)
Lab: 8 mg/dL — ABNORMAL LOW (ref 8.4–10.2)

## 2020-02-01 LAB — PROTIME INR (PT): Lab: 14 s — ABNORMAL HIGH (ref 9.9–12.6)

## 2020-02-01 NOTE — Patient Education
Dear Ms. Landa,         Thank you for choosing [facility] Interventional Radiology for your procedure. Your appointment information is listed below:    Appointment Date: 02/03/20  Appointment Time: 12:00pm  Arrival Time:11:00am  Location:     ? [place]: [address] [place], [place]  [address]  Parking: P3 Parking [place] are scheduled for a procedure in Interventional Radiology.  Please follow these instructions and any direction from your Primary Care/Managing Physician.  If you have questions about your procedure or need to reschedule please call [phone].    Medication Instructions:   You may take the following medications with a small sip of water:  Continue your regular medications as directed.     Diet Instructions: No dietary restrictions   Day of Exam Instructions:  1. Bathe or shower with an antibacterial soap prior to your appointment.  2. Bring a list of your current medications and the dosages.  3. Wear comfortable clothing and leave valuables at home.  4. Arrive 1 hour prior to your appointment.  This time will be spent registering, interviewing, assessing, educating and preparing you for the test.  ? You will be with [place] anywhere from 30 minutes to 6 hours after your exam depending on your procedure.    Interventional Radiology Team  Perioperative and Procedural Scheduling Department  [facility]  Ph: [phone]

## 2020-02-01 NOTE — Telephone Encounter
call returned. labs reviewed. hgb from 3/9 is 7.7. Patient is getting para at [facility] on Wednesday 3/17. Labs will be drawn and reviewed again at that time, will order blood transfusion if needed after review. Will continue to follow.  Patient v/u.

## 2020-02-01 NOTE — Progress Notes
Interventional Radiology Outpatient Scheduling Checklist      1.  Name of Procedure(s):   Paracentesis      2.  Date of Procedure:   02/03/20      3.  Arrival Time:   1100      4.  Procedure Time:  1601      0.  Correct Procedural Room Assignment:  [place] [address]      6.  Blood Thinners Triaged and instructed per protocol: Y/N/NA:  NA  Confirmed accurate instructions sent to patient: Y/N:  NA       7.  Procedure Order Verified: Y/N:  Yes      9.  Patient instructed to have a driver: Y/N/NA:  Yes    10.  Patient instructed on NPO status: Y/N/NA:  No dietary restrictions  Confirmed accurate instructions sent to patient: Y/N:  Yes    11.  Specimen needed: Y/N/NA:  Yes   Verified Order placed: Y/N:  Yes    12.  Allergies Verified:  Y/N:  Yes    13.  Is there an Iodine Allergy: Y/N:  No  Does the Procedure Require contrast: Y/N:  No  If so, was the IR- Contrast Allergy Pre-Procedure Medication protocol ordered: Y/NA:  NA    14.  Does the patient have labs according to IR Pre-procedure Laboratory Parameter policy: Y/N/NA:  Yes  If No, was the patient instructed to obtain labs prior to procedure: Y/N/NA:  NA     15.  Will the patient need to be admitted or have a possible admission: Y/N:  No  If yes, confirmed accurate instructions sent to patient: Y/N/NA:  NA     16.  Patient States Understanding: Y/N:  Yes    17.  History of OSA:  Y/N:  No  If yes, confirm request to bring CPAP sent to patient: Y/N/NA:  NA    18. Patient declines electronic procedure instructions: Y/N:  No

## 2020-02-01 NOTE — Telephone Encounter
Call from [facility] regarding paracentesis and lab draw.  Please return her call.

## 2020-02-02 ENCOUNTER — Encounter: Admit: 2020-02-02 | Discharge: 2020-02-02 | Payer: MEDICARE

## 2020-02-02 DIAGNOSIS — R799 Abnormal finding of blood chemistry, unspecified: Secondary | ICD-10-CM

## 2020-02-02 DIAGNOSIS — K31819 Angiodysplasia of stomach and duodenum without bleeding: Secondary | ICD-10-CM

## 2020-02-02 DIAGNOSIS — R945 Abnormal results of liver function studies: Secondary | ICD-10-CM

## 2020-02-02 IMAGING — CR CHEST
2 series · 2 of 2 positions shown · non-contrast
Comparison: none

[chest pa x-wise]
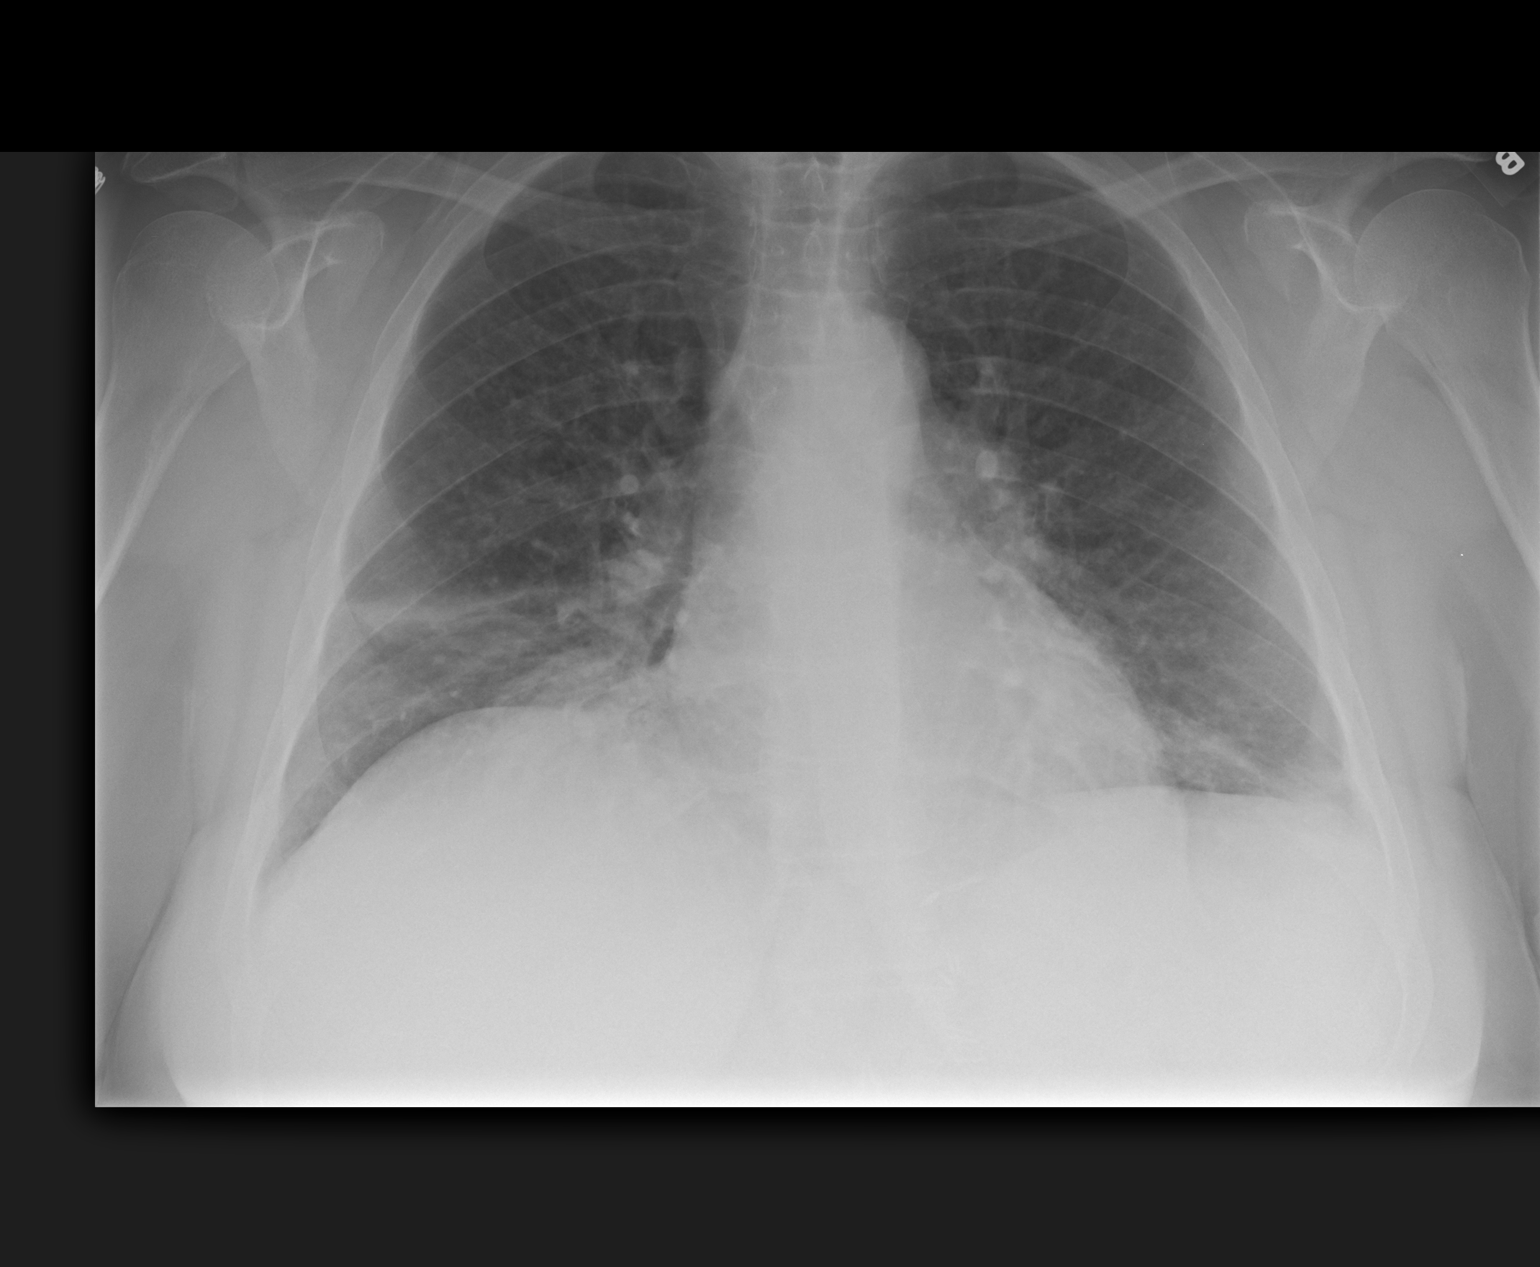

[chest lat]
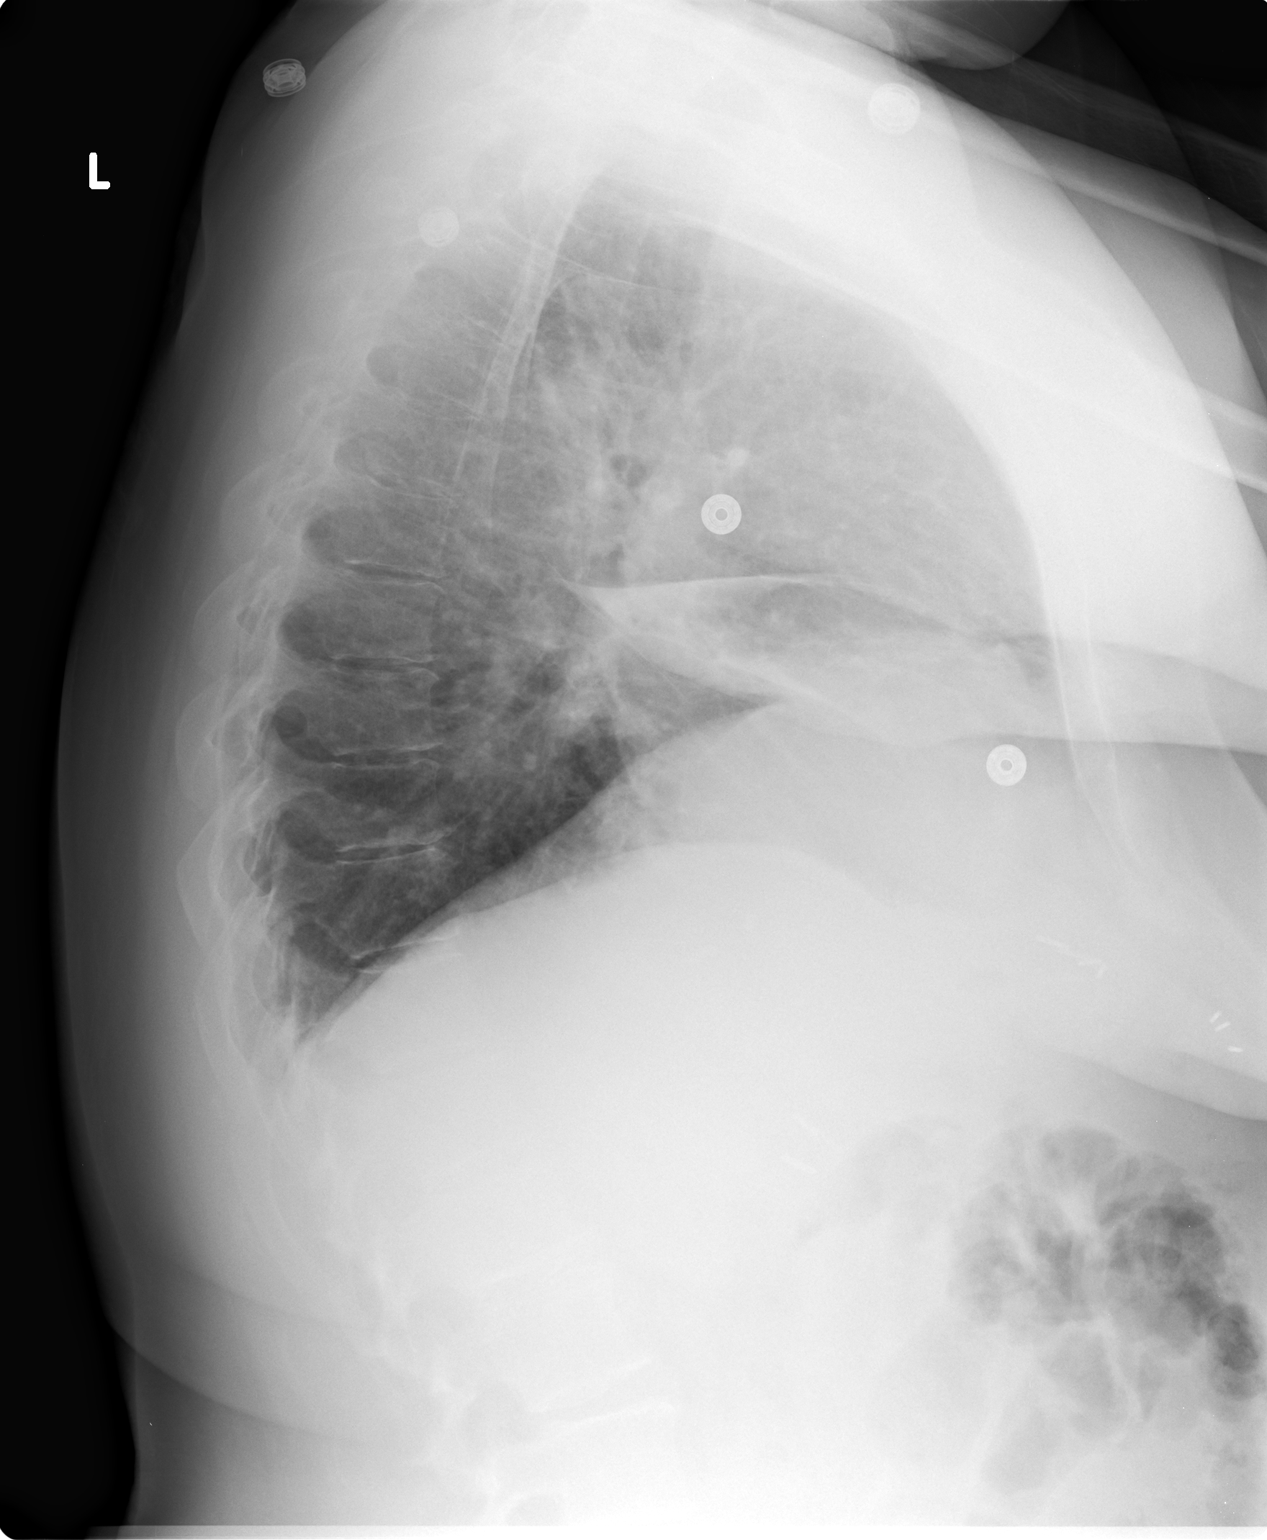

[2 of 2 positions shown; findings below may reference images not displayed]

DIAGNOSTIC STUDIES

EXAM

Two-view chest

INDICATION

Shortness of breath

TECHNIQUE

Two-view chest

COMPARISONS

None

FINDINGS

The cardiomediastinal silhouette is within normal limits.

There is a right middle lobe infiltrate. There is a minimal left basilar infiltrate

No failure or effusion is present.

IMPRESSION

There is a right middle lobe and left basilar infiltrate present.

Tech Notes:

PT STATES SOA X 3 DAYS. PT STATES MEDS FOR HYPOTENSION. SB

## 2020-02-03 ENCOUNTER — Encounter: Admit: 2020-02-03 | Discharge: 2020-02-03 | Payer: MEDICARE

## 2020-02-03 ENCOUNTER — Ambulatory Visit: Admit: 2020-02-03 | Discharge: 2020-02-03 | Payer: MEDICARE

## 2020-02-03 DIAGNOSIS — D500 Iron deficiency anemia secondary to blood loss (chronic): Secondary | ICD-10-CM

## 2020-02-03 DIAGNOSIS — K31819 Angiodysplasia of stomach and duodenum without bleeding: Secondary | ICD-10-CM

## 2020-02-03 DIAGNOSIS — R799 Abnormal finding of blood chemistry, unspecified: Secondary | ICD-10-CM

## 2020-02-03 DIAGNOSIS — K760 Fatty (change of) liver, not elsewhere classified: Secondary | ICD-10-CM

## 2020-02-03 DIAGNOSIS — K7469 Other cirrhosis of liver: Secondary | ICD-10-CM

## 2020-02-03 DIAGNOSIS — R945 Abnormal results of liver function studies: Secondary | ICD-10-CM

## 2020-02-03 LAB — COMPREHENSIVE METABOLIC PANEL
Lab: 1.3 mg/dL — ABNORMAL HIGH (ref 0.4–1.00)
Lab: 10 (ref 3–12)
Lab: 101 MMOL/L (ref 98–110)
Lab: 136 MMOL/L — ABNORMAL LOW (ref 137–147)
Lab: 165 U/L — ABNORMAL HIGH (ref 25–110)
Lab: 172 mg/dL — ABNORMAL HIGH (ref 70–100)
Lab: 20 U/L (ref 7–56)
Lab: 25 MMOL/L (ref 21–30)
Lab: 25 mg/dL (ref 7–25)
Lab: 3 g/dL — ABNORMAL LOW (ref 3.5–5.0)
Lab: 3.5 MMOL/L (ref 3.5–5.1)
Lab: 34 U/L (ref 7–40)
Lab: 38 mL/min — ABNORMAL LOW (ref >60)
Lab: 46 mL/min — ABNORMAL LOW (ref >60)
Lab: 5.7 g/dL — ABNORMAL LOW (ref 6.0–8.0)
Lab: 8.1 mg/dL — ABNORMAL LOW (ref 8.5–10.6)

## 2020-02-03 LAB — CBC AND DIFF
Lab: 0.1 10*3/uL (ref 0–0.45)
Lab: 0.6 10*3/uL — ABNORMAL LOW (ref 1.0–4.8)
Lab: 0.8 10*3/uL (ref 0–0.80)
Lab: 12 % (ref 4–12)
Lab: 2 % (ref 0–2)
Lab: 2 % (ref 0–5)
Lab: 2.9 M/UL — ABNORMAL LOW (ref 4.0–5.0)
Lab: 218 10*3/uL (ref 150–400)
Lab: 22 % — ABNORMAL LOW (ref 36–45)
Lab: 24 pg — ABNORMAL LOW (ref 26–34)
Lab: 25 % — ABNORMAL HIGH (ref 11–15)
Lab: 32 g/dL (ref 32.0–36.0)
Lab: 5 10*3/uL (ref 1.8–7.0)
Lab: 6.7 10*3/uL (ref 4.5–11.0)
Lab: 7.1 g/dL — ABNORMAL LOW (ref 12.0–15.0)
Lab: 75 % (ref 41–77)
Lab: 75 FL — ABNORMAL LOW (ref 80–100)
Lab: 8.4 FL (ref 7–11)
Lab: 9 % — ABNORMAL LOW (ref 24–44)

## 2020-02-03 LAB — IRON + BINDING CAPACITY + %SAT+ FERRITIN
Lab: 24 ng/mL (ref 10–200)
Lab: 25 ug/dL — ABNORMAL LOW (ref 50–160)
Lab: 367 ug/dL (ref 270–380)
Lab: 7 % — ABNORMAL LOW (ref 28–42)

## 2020-02-03 LAB — PROTIME INR (PT): Lab: 1.5 mg/dL — ABNORMAL HIGH (ref 0.8–1.2)

## 2020-02-03 LAB — TRANSFERRIN: Lab: 246 mg/dL (ref 185–336)

## 2020-02-03 MED ORDER — ALBUMIN, HUMAN 25 % IV SOLP
0 refills | Status: CP
Start: 2020-02-03 — End: ?
  Administered 2020-02-03 (×4): 12.5 g via INTRAVENOUS

## 2020-02-03 NOTE — Progress Notes
Patient met all discharge criteria. AVS reviewed and all questions answered at this time. Discharged via wheelchair to private vehicle with all personal belongings.

## 2020-02-03 NOTE — Other
Immediate Post Procedure Note    Date:  02/03/2020                                         Attending Physician:   Earvin Hansen, MD  Performing Provider:  Rachel Bo, DO    Consent:  Consent obtained from patient.  Time out performed: Consent obtained, correct patient verified, correct procedure verified, correct site verified, patient marked as necessary.  Pre/Post Procedure Diagnosis:  Liver cirrhosis  Indications:  As above    Anesthesia: Local 10 mL 1% lidocaine without epinephrine  Procedure(s): Paracentesis  Findings:  Successful paracentesis     Estimated Blood Loss:  None/Negligible  Specimen(s) Removed/Disposition:  Yes, sent to pathology  Complications: None  Patient Tolerated Procedure: Well  Post-Procedure Condition:  stable     , DO  Pager

## 2020-02-03 NOTE — H&P (View-Only)
IR Pre-Procedure History and Physical/Sedation Plan    Procedure Date: 02/03/2020     Planned Procedure(s):  Ultrasound-guided paracentesis     Indication:  Fluid testing; Therapeutic drainage  __________________________________________________________________    Chief Complaint:  Ascites    History of Present Illness: Kristin Moody is a 64 y.o. female with a history as listed below who presents today for procedure.    Patient Active Problem List    Diagnosis Date Noted   ? 'light-for-dates' infant with signs of fetal malnutrition 01/22/2020   ? 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 Anemia 09/28/2019   ? Encephalopathy 08/12/2019   ? Spasticity 08/10/2019   ? Hyperreflexia 08/10/2019   ? 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 Right leg weakness 05/14/2019   ? 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 Iron (Fe) deficiency anemia 01/19/2019   ? Preop cardiovascular exam 01/07/2019   ? Pre-transplant evaluation for liver transplant 01/06/2019   ? Cirrhosis (HCC) 12/16/2018   ? Acute on chronic anemia 12/16/2018   ? Lactic acidosis 12/16/2018   ? GI bleed 10/24/2018   ? Portal hypertension (HCC) 08/28/2018   ? Confusion 08/27/2018   ? Dyspnea 08/27/2018   ? Chronic abdominal pain 08/27/2018   ? 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 Acute on chronic blood loss anemia 08/27/2018   ? Iron deficiency anemia 04/13/2018   ? Pneumonia due to infectious organism 04/13/2018   ? Melena 04/10/2018   ? Esophageal varices without bleeding (HCC) 02/25/2018   ? Cirrhosis of liver without ascites (HCC) 02/25/2018   ? 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 Lower abdominal pain 10/24/2014   ? Chest discomfort 09/21/2014   ? Essential hypertension 08/13/2012   ? Abnormal liver function tests 05/13/2011   ? Fatty liver disease, nonalcoholic 05/13/2011   ? Obesity (BMI 30-39.9) 05/13/2011   ? Slow transit constipation 05/13/2011     Medical History:   Diagnosis Date ? Aneurysm (HCC)    ? 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 Diverticulosis of colon     descending and sigmoid colon   ? Dyspnea    ? Essential hypertension 08/13/2012   ? Fatty infiltration of liver    ? HTN (hypertension)    ? Obesity (BMI 30-39.9) 05/13/2011   ? Preop cardiovascular exam 01/07/2019   ? Sinus infection    ? Type 2 diabetes mellitus (HCC) 09/21/2014      Surgical History:   Procedure Laterality Date   ? HYSTERECTOMY  1994   ? UPPER GASTROINTESTINAL ENDOSCOPY  2009   ? COLONOSCOPY  2009   ? CYSTOCELE REPAIR  2009    with endocele repair   ? RECTOCELE REPAIR  03/2009   ? LIVER BIOPSY  07/17/2010   ? COLONOSCOPY N/A 11/08/2017    Performed by Dawna Part, MD at [facility] ENDO   ? ESOPHAGOGASTRODUODENOSCOPY N/A 11/08/2017    Performed by Dawna Part, MD at [facility] ENDO   ? ESOPHAGOGASTRODUODENOSCOPY WITH BIOPSY - FLEXIBLE  11/08/2017    Performed by Dawna Part, MD at [facility] ENDO   ? COLONOSCOPY WITH HOT BIOPSY FORCEPS REMOVAL TUMOR/ POLYP/ OTHER LESION  11/08/2017    Performed by Dawna Part, MD at [facility] ENDO   ? ESOPHAGOGASTRODUODENOSCOPY WITH BAND LIGATION ESOPHAGEAL/ GASTRIC VARICES - FLEXIBLE N/A 04/10/2018    Performed by Normajean Baxter, MD at [facility] ENDO   ?  ESOPHAGOGASTRODUODENOSCOPY WITH BIOPSY - FLEXIBLE N/A 04/10/2018    Performed by Normajean Baxter, MD at [facility] ENDO   ? ESOPHAGOGASTRODUODENOSCOPY WITH CONTROL OF BLEEDING - FLEXIBLE N/A 04/25/2018    Performed by Jolee Ewing, MD at [facility] ENDO   ? ESOPHAGOGASTRODUODENOSCOPY WITH BIOPSY - FLEXIBLE with push enteroscopy N/A 08/28/2018    Performed by Celesta Gentile, MD at [facility] ENDO   ? EGD N/A 10/27/2018    Performed by Eliott Nine, MD at [facility] ENDO   ? ESOPHAGOGASTRODUODENOSCOPY WITH SNARE REMOVAL TUMOR/ POLYP/ OTHER LESION - FLEXIBLE N/A 10/27/2018    Performed by Eliott Nine, MD at [facility] ENDO   ? ESOPHAGOGASTRODUODENOSCOPY WITH BIOPSY - FLEXIBLE N/A 12/16/2018    Performed by Buckles, Vinnie Level, MD at [facility] ENDO   ? ESOPHAGOGASTRODUODENOSCOPY WITH CONTROL OF BLEEDING - FLEXIBLE N/A 12/16/2018    Performed by Buckles, Vinnie Level, MD at [facility] ENDO   ? ESOPHAGOGASTRODUODENOSCOPY WITH SPECIMEN COLLECTION BY BRUSHING/ WASHING N/A 01/22/2019    Performed by Veneta Penton, MD at [facility] ENDO   ? ESOPHAGOGASTRODUODENOSCOPY WITH DILATION ESOPHAGUS WITH BALLOON 30 MM OR GREATER - FLEXIBLE N/A 01/22/2019    Performed by Veneta Penton, MD at [facility] ENDO   ? ESOPHAGOGASTRODUODENOSCOPY WITH BIOPSY - FLEXIBLE N/A 01/22/2019    Performed by Veneta Penton, MD at [facility] ENDO   ? ESOPHAGOGASTRODUODENOSCOPY WITH BIOPSY - FLEXIBLE N/A 06/19/2019    Performed by Dawna Part, MD at [facility] ENDO   ? ESOPHAGOGASTRODUODENOSCOPY [WITH APC]  WITH SPECIMEN COLLECTION BY BRUSHING/ WASHING N/A 08/07/2019    Performed by Dawna Part, MD at [facility] ENDO   ? ESOPHAGOGASTRODUODENOSCOPY WITH SPECIMEN COLLECTION BY BRUSHING/ WASHING N/A 09/10/2019    Performed by Buckles, Vinnie Level, MD at [facility] ENDO   ? ESOPHAGOGASTRODUODENOSCOPY WITH CONTROL OF BLEEDING - FLEXIBLE N/A 09/10/2019    Performed by Buckles, Vinnie Level, MD at [facility] ENDO   ? CHOLECYSTECTOMY     ? COLONOSCOPY     ? ESOPHAGOGASTRIC FUNDOPLICATION  2003, 2004    laparoscopic   ? ESOPHAGOGASTRIC FUNDOPLICATION     ? HX HYSTERECTOMY     ? PR ESOPHAGOSCOPY FLEXIBLE TRANSORAL DIAGNOSTIC        Social History     Tobacco Use   ? Smoking status: Never Smoker   ? Smokeless tobacco: Never Used   Substance Use Topics   ? Alcohol use: No      Family History   Problem Relation Age of Onset   ? Other Mother    ? Kidney Failure Mother    ? Osteoporosis Mother    ? Cancer Father         esophageal   ? Liver Disease Sister         endstage, s/p liver transplant   ? Cancer Brother         tonsil, liver    ? Hip Fracture Neg Hx       Medications Prior to Admission   Medication Sig Dispense Refill Last Dose   ? atorvastatin (LIPITOR) 10 mg tablet TAKE 1 TABLET BY MOUTH EVERY DAY 90 tablet 3 02/03/2020   ? cetirizine (ZYRTEC) 10 mg tablet Take 10 mg by mouth daily as needed for Allergy symptoms.   Unknown   ? ciprofloxacin (CIPRO) 500 mg tablet On 02/19 take 1 tablet in the evening. On 02/20 & 02/21 take 1 tablet in the morning and 1 tablet in the evening. Take 1 tablet in the morning starting 02/22. 32  tablet 3 02/03/2020   ? duloxetine DR (CYMBALTA) 60 mg capsule Take 60 mg by mouth daily.   02/03/2020   ? furosemide (LASIX) 20 mg tablet Take two tablets by mouth every morning. 270 tablet 1 02/03/2020   ? pantoprazole DR (PROTONIX) 40 mg tablet Take one tablet by mouth twice daily. 60 tablet 3 02/03/2020   ? polyethylene glycol 3350 (MIRALAX) 17 g packet Take one packet by mouth twice daily.   02/02/2020   ? promethazine (PHENERGAN) 25 mg tablet Take 25 mg by mouth every 4 hours as needed. Just uses it with Tramadol   02/03/2020   ? rifAXIMin (XIFAXAN) 550 mg tablet Take one tablet by mouth twice daily. 180 tablet 3 02/03/2020   ? spironolactone (ALDACTONE) 50 mg tablet Take one tablet by mouth daily. Take with food. 30 tablet 5 02/03/2020   ? traMADoL (ULTRAM) 50 mg tablet Take 1 tablet by mouth as Needed.   Unknown   ? zinc sulfate 220 mg (50 mg elemental zinc) capsule Take one capsule by mouth daily. 90 capsule 3 02/03/2020     Allergies   Allergen Reactions   ? Tegaderm BLISTERS   ? Adhesive Tape (Rosins) RASH   ? Sulfa (Sulfonamide Antibiotics) RASH   ? Codeine NAUSEA AND VOMITING   ? Morphine SEE COMMENTS     Pt reports burns her veins   ? Penicillin G SEE COMMENTS     Throat issues, blisters in throat    ? Tramadol NAUSEA ONLY and ITCHING     Takes this medication regularly        Review of Systems  Constitutional: negative for fevers and chills  Respiratory: negative  Cardiovascular: negative  Gastrointestinal: negative for nausea and vomiting    Physical Exam:  Vital Signs: Last Filed In 24 Hours Vital Signs: 24 Hour Range   BP: 119/71 (03/17 1100)  Temp: 36.7 ?C (98.1 ?F) (03/17 1100)  Pulse: 95 (03/17 1100)  Respirations: 25 PER MINUTE (03/17 1100)  SpO2: 98 % (03/17 1100)  SpO2 Pulse: 96 (03/17 1100) BP: (119)/(71)   Temp:  [36.7 ?C (98.1 ?F)]   Pulse:  [95]   Respirations:  [25 PER MINUTE]   SpO2:  [98 %]    Intensity Pain Scale (Self Report): 7 (02/03/20 1100)      General appearance: alert and no distress noted.  Neurologic: Grossly normal.  Lungs: Non labored.  Heart: regular rate and rhythm  Abdomen: distended    Pre-procedure anxiolysis plan: N/A  Sedation/Medication Plan: Local anesthetic  Personal history of sedation complications: Denies adverse event.   Family history of sedation complications: Denies adverse event.   Medications for Reversal: NA  Discussion/Reviews:  Physician has discussed risks and alternatives of this type of sedation and above planned procedures with patient    NPO Status: NA  Airway:  NA  Head and Neck: NA  Mouth: NA   Anesthesia Classification:  ASA III (A patient with a severe systemic disease that limits activity, but is not incapacitating)  Pregnancy Status: N/A    Lab/Radiology/Other Diagnostic Tests:  Labs:  Pertinent labs reviewed           Dollene Primrose, APRN-NP  Pager [phone]

## 2020-02-03 NOTE — Patient Instructions
INTERVENTIONAL RADIOLOGY DISCHARGE INSTRUCTIONS  PARACENTESIS  A paracentesis is the removal of an abnormal buildup of fluid in your abdominal cavity.? This fluid buildup is called ascites and may be caused by conditions such as liver disease, heart failure, or cancer. During this procedure, a needle is inserted into your abdomen to drain the fluid.? The fluid may then be sent to the lab for testing if medically indicated.? Removal of the fluid may also relieve belly pressure and shortness of breath caused by the ascites.?  AFTER YOUR PROCEDURE:  ? You will recover in Interventional Radiology for a minimum of 30 minutes after your procedure.  ? You will be on bedrest with bathroom privileges after the procedure.?  POST-PROCEDURE PAIN:   ? Pain control following your procedure is a priority for both you and your Physicians.  ? Some soreness or tenderness at the site is to be expected for several days. We recommend taking over the counter analgesics to help relieve this pain.  ? Alternative methods for pain relief include but not limited to heat or cold compress, relaxation techniques, rest, and changing of positions.?  ? If pain continues after 5-7 days or you have severe pain not relieved by medication, please contact us as directed below.?  POST-PROCEDURE ACTIVITY:   ? A responsible adult must drive you home. ???  ? If you receive sedation, narcotic pain medication or anesthesia for the procedure, you should not drive or operate heavy machinery or do anything that requires concentration for at least 24 hours after procedure completion.  ? It is recommended that a responsible adult be with you until morning.?  POST-PROCEDURE SITE CARE:  ? You will have a small bandage over the procedure site.? Keep this dry.  ? You may remove it in 24 hours.  ? You may shower in 24 hours, after removing the bandage.  ? A dry gauze bandage may be reapplied as necessary to protect your clothing as the site may sometimes leak for several days after the procedure.  ? Do not submerge the procedure site for 1 week (no bathtub, swimming, hot tub, etc.)  ? Do not use ointments, creams or powders on the puncture site.  ? Be sure your hands are clean when touching near the site.?  DIET/MEDICATIONS:   ? You may resume your previous diet after the procedure.  ? If you receive sedation or narcotic pain medications, avoid any foods or beverages containing alcohol for at least 24 hours after the procedure.  ? Please see the Medication Reconciliation sheet for instructions regarding resuming your home medications.?  CALL THE DOCTOR IF:   ? Bright red blood soaks the bandage.  ? You have pain not relieved by medication.? Some soreness at the site is to be expected.  ? You have signs of infection such as: fever greater than 101F, chills, redness, warmth, swelling, drainage or pus from the puncture site.?  For any of the above symptoms or for problems or concerns related to the procedure performed at the [facility], call?[phone] Monday-Friday from 7-5p.? After-hours and weekends, please call?[phone] and ask for the Interventional Radiology Resident on-call.  YOU OR YOUR CAREGIVER SHOULD CALL 911 FOR ANY SEVERE SYMPTOMS SUCH AS EXCESSIVE BLEEDING, SEVERE DIZZINESS, TROUBLE BREATHING OR LOSS OF CONSCIOUSNESS.  ?  ?

## 2020-02-04 LAB — GRAM STAIN

## 2020-02-05 ENCOUNTER — Encounter: Admit: 2020-02-05 | Discharge: 2020-02-05 | Payer: MEDICARE

## 2020-02-05 MED ORDER — CIPROFLOXACIN HCL 500 MG PO TAB
500 mg | ORAL_TABLET | Freq: Every day | ORAL | 3 refills | 10.00000 days | Status: DC
Start: 2020-02-05 — End: 2020-02-28

## 2020-02-05 MED ORDER — IRON DEXTRAN IVPB
25 mg | Freq: Once | INTRAVENOUS | 0 refills | Status: CN
Start: 2020-02-05 — End: ?

## 2020-02-05 MED ORDER — ACETAMINOPHEN 500 MG PO TAB
500 mg | Freq: Once | ORAL | 0 refills | Status: CN
Start: 2020-02-05 — End: ?

## 2020-02-05 MED ORDER — DIPHENHYDRAMINE HCL 25 MG PO CAP
25 mg | Freq: Once | ORAL | 0 refills | Status: CN
Start: 2020-02-05 — End: ?

## 2020-02-05 MED ORDER — IRON DEXTRAN IVPB
975 mg | Freq: Once | INTRAVENOUS | 0 refills | Status: CN
Start: 2020-02-05 — End: ?

## 2020-02-05 MED ORDER — POTASSIUM CHLORIDE 20 MEQ PO TBER
20 meq | ORAL_TABLET | Freq: Every day | ORAL | 0 refills | 30.00000 days | Status: DC
Start: 2020-02-05 — End: 2020-02-28

## 2020-02-05 NOTE — Telephone Encounter
patient notified per result note to stop spironolactone, start potassium 44meq dailiy.  Potassium Script and Cipro refill script sent to patient pharmacy.  Patient notified of low iron, and need for iron infusion. Orders placed.   Patient will get repeat CBC lab on Tuesday 3/23 at [facility] (or at [facility] if she ends up needing a para before then). current cbc 7.1.  Based on next week's cbc, patient will likely need blood transfusion. will continue to follow.

## 2020-02-08 MED ORDER — POTASSIUM CHLORIDE 20 MEQ PO TBER
20 meq | ORAL_TABLET | Freq: Every day | ORAL | 0 refills
Start: 2020-02-08 — End: ?

## 2020-02-09 ENCOUNTER — Encounter: Admit: 2020-02-09 | Discharge: 2020-02-09 | Payer: MEDICARE

## 2020-02-09 DIAGNOSIS — R945 Abnormal results of liver function studies: Secondary | ICD-10-CM

## 2020-02-09 DIAGNOSIS — K31819 Angiodysplasia of stomach and duodenum without bleeding: Secondary | ICD-10-CM

## 2020-02-09 DIAGNOSIS — D500 Iron deficiency anemia secondary to blood loss (chronic): Secondary | ICD-10-CM

## 2020-02-09 DIAGNOSIS — K7460 Unspecified cirrhosis of liver: Secondary | ICD-10-CM

## 2020-02-09 LAB — CBC AND DIFF
Lab: 0.1
Lab: 0.3 10*3/uL (ref 0.0–0.6)
Lab: 0.9 10*3/uL (ref 0.1–0.9)
Lab: 1.3 10*3/uL (ref 0.9–5.1)
Lab: 1.5 % (ref 0.0–2.0)
Lab: 10 % — ABNORMAL HIGH (ref 0.0–10.0)
Lab: 2.7 10*6/uL — ABNORMAL LOW (ref 4.20–5.40)
Lab: 21 % — ABNORMAL LOW (ref 37.0–47.0)
Lab: 22 % — ABNORMAL HIGH (ref 11.5–14.5)
Lab: 22 pg — ABNORMAL LOW (ref 27.0–31.0)
Lab: 29 g/dL — ABNORMAL LOW (ref 30.0–34.0)
Lab: 5.7 10*3/uL (ref 1.9–8.1)
Lab: 6.4 g/dL — ABNORMAL LOW (ref 12.0–16.0)
Lab: 77 fL — ABNORMAL LOW (ref 80.0–99.0)
Lab: 8.3 10*3/uL (ref 4.8–10.8)

## 2020-02-09 LAB — COMPREHENSIVE METABOLIC PANEL
Lab: 1.2 mg/dL — ABNORMAL HIGH (ref 0.2–1.2)
Lab: 103 mmol/L (ref 98–107)
Lab: 12 meq/L (ref 0–14)
Lab: 125 mg/dL — ABNORMAL HIGH (ref 70–105)
Lab: 134 mmol/L — ABNORMAL LOW (ref 136–145)
Lab: 183 U/L — ABNORMAL HIGH (ref 40–150)
Lab: 2.8 g/dL — ABNORMAL LOW (ref 3.4–4.8)
Lab: 20 U/L (ref 0–55)
Lab: 23 mmol/L (ref 23–31)
Lab: 37 U/L — ABNORMAL HIGH (ref 5–34)
Lab: 4 mmol/L (ref 3.5–5.1)
Lab: 47 mL/min/{1.73_m2} — ABNORMAL LOW (ref >59)
Lab: 5.7 g/dL — ABNORMAL LOW (ref 6.2–8.1)
Lab: 8 mg/dL — ABNORMAL LOW (ref 8.4–10.2)

## 2020-02-09 LAB — PROTIME INR (PT)
Lab: 1.4 % — ABNORMAL LOW (ref 18.0–47.0)
Lab: 15 s — ABNORMAL HIGH (ref 9.9–12.6)

## 2020-02-09 NOTE — Telephone Encounter
Call from [place] advising of lab draw this morning.  Please return her call.

## 2020-02-09 NOTE — Telephone Encounter
call returned, patient had lab draw on 3/23 at 0730, will request records today

## 2020-02-10 ENCOUNTER — Ambulatory Visit: Admit: 2020-02-10 | Discharge: 2020-02-10 | Payer: MEDICARE

## 2020-02-10 ENCOUNTER — Encounter: Admit: 2020-02-10 | Discharge: 2020-02-10 | Payer: MEDICARE

## 2020-02-10 ENCOUNTER — Ambulatory Visit: Admit: 2020-02-10 | Discharge: 2020-02-11 | Payer: MEDICARE

## 2020-02-10 ENCOUNTER — Inpatient Hospital Stay: Admit: 2020-02-10 | Payer: MEDICARE

## 2020-02-10 DIAGNOSIS — R0600 Dyspnea, unspecified: Secondary | ICD-10-CM

## 2020-02-10 DIAGNOSIS — J329 Chronic sinusitis, unspecified: Secondary | ICD-10-CM

## 2020-02-10 DIAGNOSIS — K760 Fatty (change of) liver, not elsewhere classified: Secondary | ICD-10-CM

## 2020-02-10 DIAGNOSIS — D500 Iron deficiency anemia secondary to blood loss (chronic): Secondary | ICD-10-CM

## 2020-02-10 DIAGNOSIS — K7460 Unspecified cirrhosis of liver: Secondary | ICD-10-CM

## 2020-02-10 DIAGNOSIS — E669 Obesity, unspecified: Secondary | ICD-10-CM

## 2020-02-10 DIAGNOSIS — Z01810 Encounter for preprocedural cardiovascular examination: Secondary | ICD-10-CM

## 2020-02-10 DIAGNOSIS — I10 Essential (primary) hypertension: Secondary | ICD-10-CM

## 2020-02-10 DIAGNOSIS — E119 Type 2 diabetes mellitus without complications: Secondary | ICD-10-CM

## 2020-02-10 DIAGNOSIS — K5730 Diverticulosis of large intestine without perforation or abscess without bleeding: Secondary | ICD-10-CM

## 2020-02-10 DIAGNOSIS — R188 Other ascites: Secondary | ICD-10-CM

## 2020-02-10 DIAGNOSIS — D649 Anemia, unspecified: Secondary | ICD-10-CM

## 2020-02-10 DIAGNOSIS — I729 Aneurysm of unspecified site: Secondary | ICD-10-CM

## 2020-02-10 DIAGNOSIS — I959 Hypotension, unspecified: Secondary | ICD-10-CM

## 2020-02-10 LAB — CBC AND DIFF
Lab: 0 10*3/uL (ref 0–0.45)
Lab: 0.1 10*3/uL (ref 0–0.20)
Lab: 0.6 10*3/uL (ref 0–0.80)
Lab: 0.6 10*3/uL — ABNORMAL LOW (ref 1.0–4.8)
Lab: 1 % (ref 0–5)
Lab: 11 % (ref 4–12)
Lab: 12 % — ABNORMAL LOW (ref 24–44)
Lab: 18 % — ABNORMAL LOW (ref 36–45)
Lab: 2 % (ref 0–2)
Lab: 2.5 M/UL — ABNORMAL LOW (ref 4.0–5.0)
Lab: 226 10*3/uL (ref 150–400)
Lab: 23 pg — ABNORMAL LOW (ref 26–34)
Lab: 24 % — ABNORMAL HIGH (ref 11–15)
Lab: 31 g/dL — ABNORMAL LOW (ref 32.0–36.0)
Lab: 4.4 10*3/uL (ref 1.8–7.0)
Lab: 5.8 g/dL — CL (ref 12.0–15.0)
Lab: 6 10*3/uL (ref 4.5–11.0)
Lab: 72 FL — ABNORMAL LOW (ref 80–100)
Lab: 74 % (ref 41–77)
Lab: 9.3 FL (ref 7–11)

## 2020-02-10 LAB — PERITONEAL FLUID TOTAL PROTEIN

## 2020-02-10 LAB — COMPREHENSIVE METABOLIC PANEL
Lab: 1.1 mg/dL (ref 0.3–1.2)
Lab: 1.4 mg/dL — ABNORMAL HIGH (ref 0.4–1.00)
Lab: 111 mg/dL — ABNORMAL HIGH (ref 70–100)
Lab: 134 MMOL/L — ABNORMAL LOW (ref 137–147)
Lab: 162 U/L — ABNORMAL HIGH (ref 25–110)
Lab: 18 U/L (ref 7–56)
Lab: 24 MMOL/L (ref 21–30)
Lab: 3.1 g/dL — ABNORMAL LOW (ref 3.5–5.0)
Lab: 35 mg/dL — ABNORMAL HIGH (ref 7–25)
Lab: 37 mL/min — ABNORMAL LOW (ref >60)
Lab: 4.5 MMOL/L (ref 3.5–5.1)
Lab: 44 U/L — ABNORMAL HIGH (ref 7–40)
Lab: 45 mL/min — ABNORMAL LOW (ref >60)
Lab: 5.9 g/dL — ABNORMAL LOW (ref 6.0–8.0)
Lab: 8.6 mg/dL (ref 8.5–10.6)
Lab: 9 (ref 3–12)

## 2020-02-10 LAB — POC LACTATE: Lab: 3.2 MMOL/L — ABNORMAL HIGH (ref 0.5–2.0)

## 2020-02-10 LAB — PERITONEAL FLUID LACTATE DEHYROGENASE: Lab: 36 U/L — ABNORMAL LOW (ref 67–140)

## 2020-02-10 LAB — PERITONEAL FLUID GLUCOSE: Lab: 142 mg/dL — ABNORMAL HIGH (ref 70–100)

## 2020-02-10 LAB — COVID-19 (SARS-COV-2) PCR

## 2020-02-10 LAB — PROTIME INR (PT): Lab: 1.5 MMOL/L — ABNORMAL HIGH (ref 0.8–1.2)

## 2020-02-10 LAB — TROPONIN-I: Lab: 0 ng/mL (ref 0.0–0.05)

## 2020-02-10 MED ORDER — PANTOPRAZOLE 40 MG PO TBEC
40 mg | Freq: Two times a day (BID) | ORAL | 0 refills | Status: DC
Start: 2020-02-10 — End: 2020-02-13
  Administered 2020-02-11 – 2020-02-12 (×4): 40 mg via ORAL

## 2020-02-10 MED ORDER — DULOXETINE 60 MG PO CPDR
60 mg | Freq: Every day | ORAL | 0 refills | Status: DC
Start: 2020-02-10 — End: 2020-02-13
  Administered 2020-02-11 – 2020-02-12 (×2): 60 mg via ORAL

## 2020-02-10 MED ORDER — POLYETHYLENE GLYCOL 3350 17 GRAM PO PWPK
17 g | Freq: Two times a day (BID) | ORAL | 0 refills | Status: DC
Start: 2020-02-10 — End: 2020-02-13
  Administered 2020-02-11 – 2020-02-12 (×4): 17 g via ORAL

## 2020-02-10 MED ORDER — ACETAMINOPHEN 325 MG PO TAB
650 mg | ORAL | 0 refills | Status: DC | PRN
Start: 2020-02-10 — End: 2020-02-13

## 2020-02-10 MED ORDER — ATORVASTATIN 10 MG PO TAB
10 mg | Freq: Every day | ORAL | 0 refills | Status: DC
Start: 2020-02-10 — End: 2020-02-13
  Administered 2020-02-11 – 2020-02-12 (×2): 10 mg via ORAL

## 2020-02-10 MED ORDER — MELATONIN 3 MG PO TAB
3 mg | Freq: Every evening | ORAL | 0 refills | Status: DC | PRN
Start: 2020-02-10 — End: 2020-02-13
  Administered 2020-02-11: 04:00:00 3 mg via ORAL

## 2020-02-10 MED ORDER — LIDOCAINE-EPINEPHRINE 1 %-1:100,000 IJ SOLN
20 mL | Freq: Once | INTRAMUSCULAR | 0 refills | Status: CP
Start: 2020-02-10 — End: ?
  Administered 2020-02-10: 19:00:00 20 mL via INTRAMUSCULAR

## 2020-02-10 MED ORDER — RIFAXIMIN 550 MG PO TAB
550 mg | Freq: Two times a day (BID) | ORAL | 0 refills | Status: DC
Start: 2020-02-10 — End: 2020-02-13
  Administered 2020-02-11 – 2020-02-12 (×4): 550 mg via ORAL

## 2020-02-10 MED ORDER — CIPROFLOXACIN HCL 500 MG PO TAB
500 mg | Freq: Every day | ORAL | 0 refills | Status: DC
Start: 2020-02-10 — End: 2020-02-13
  Administered 2020-02-11 – 2020-02-12 (×2): 500 mg via ORAL

## 2020-02-10 MED ORDER — ZINC SULFATE 50 MG ZINC (220 MG) PO CAP
220 mg | Freq: Every day | ORAL | 0 refills | Status: DC
Start: 2020-02-10 — End: 2020-02-13
  Administered 2020-02-11 – 2020-02-12 (×2): 220 mg via ORAL

## 2020-02-10 MED ORDER — ALBUMIN, HUMAN 25 % IV SOLP
50 g | Freq: Once | INTRAVENOUS | 0 refills | Status: CP
Start: 2020-02-10 — End: ?
  Administered 2020-02-10: 22:00:00 50 g via INTRAVENOUS

## 2020-02-10 NOTE — Progress Notes
Continuum of Care     Multiple Visit Patient - Case Management Note      NAME: Kristin Moody MRN: [id number] DOB: 02-21-1956  AGE: 64 y.o.    Admits in past 12 mos: ED Visits: 0 Admissions: 3     Source of Information: Patient, EMR    Primary MVP Case Manager: Arlyss Gandy    Reporting Out: [facility] met with Pt and her husband, Kristin Moody, at bedside. Coordinated with ED team and transplant coordinator.     DOU: Chronic, Recurrent Symptoms  Disposition: Admit- Inpatient     Interventions   ? Coordinated with ED team on Pt presentation, MVP status   ? Met with Pt and husband, Kristin Moody, at bedside. She reports she has been doing ?okay? in the last month since discharge. She reports that her BP is typically ?somewhat low? but not as low as it was in infusion clinic today. Pt was discharged from [facility] and states that she is aware that she can ?call them? if needed in the future. Pt states that HH was very helpful but she does not feel she is still in need of their services. She continues to try and do her PT exercises at home. Pt continues to await liver transplant stating that she has waited approximately 1 year. Pt and husband express no concerns related to medication affordability/ adherence or finances in the home. Pt?s main concern at this time is recent increase in cramping of hands and feet. She states that the cramps last up to 20 minutes at times. SWCM stated that I will coordinate with providers regarding this concern; Pt requests that [facility] contact Beverely Risen, transplant coordinator.   ? Contacted [employer], liver transplant coordinator to discuss Pt complaint of increased hand/foot cramping. Kristin Moody reports that this is not uncommon given the medications Pt is prescribed. Kristin Moody recommends Pt try OTC Taurine for cramping and informed that it can be purchased on [employer]. SWCM printed medication information and cost and provided to Pt. She reports that the cost is affordable to her and she will report back to Gary on effectiveness for cramping.     Next Steps  ? Continue coordinating with outpatient providers upon IP admission, future ED presentations   ? Continue to assess for challenges and barriers related to medication and symptom management         Shelda Jakes  Cell Phone: [phone] or Amie Critchley

## 2020-02-10 NOTE — ED Notes
5.8 liters removed from pt abdomen during bedside paracentesis.

## 2020-02-10 NOTE — Progress Notes
Pt presents to [facility] Infusion for 1 unit PRBC ordered by Garner Nash, APRN. Initial BP 83/39, repeat 81/39. Patient symptomatic, stating she feels lightheaded.     Kathryne Eriksson, RN working with [employer] notified of above. Per Baxter Flattery, they would like patient to report to the ED.    Last BP taken was 74/59. Tara Zimmer notified and RRT called.     Patient updated on POC and taken to ED via wheelchair by RRT RN.

## 2020-02-10 NOTE — ED Notes
MD at bedside for paracentesis

## 2020-02-10 NOTE — ED Notes
Sepsis order set d/c per verbal order Amparo Bristol, MD.

## 2020-02-10 NOTE — ED Notes
64 y/o female admitted to ED for hypotension and Hgb of 6.4. Pt was at infusion clinic for blood infusion when she became dizzy/lighthaded with hypotension, pt was RRT and brought to ED. Pt states having regular need for blood transfusion, last one approx 2 weeks ago. Pt A/A Ox4, responds appropriately. Even/unlabored respirations, RA, pt states feeling SOA. Abdomen distended, initially scheduled for paracentesis today at 1600, pt states approx 6L pulled each session. Pt denies changes in bowel/bladder patter, reports hemorrhoids. Denies pain at this time. Call light within reach, bed locked/lowered, bed rail x2, husband at bedisde.     PMH   Medical History:   Diagnosis Date    Aneurysm ([place])     Cirrhosis of liver ([place])     "decompensated" liver failure    Diverticulosis of colon     descending and sigmoid colon    Dyspnea     Essential hypertension 08/13/2012    Fatty infiltration of liver     HTN (hypertension)     Obesity (BMI 30-39.9) 05/13/2011    Preop cardiovascular exam 01/07/2019    Sinus infection     Type 2 diabetes mellitus ([place]) 09/21/2014          Belongings:  Purse  Shirt  Pants  Shoes  Bra  Underwear  Phone  Purse

## 2020-02-10 NOTE — Procedures
Paracentesis Procedure Note    Date: 02/10/2020    Procedure Performed: Paracentesis with ultrasound guidance - Therapeutic    Indication(s): symptoms or clinical signs consistent with possible SBP and symptomatic ascites    Consent: The indication, benefits, risks and alternatives to the procedure were discussed and informed consent was obtained.    Confirmed correct: Patient, Procedure and Side    Anesthesia: 1% lidocaine     Preparation: Chlorhexidine bluconate    Procedure Details  Using ultrasound guidance , an area for paracentesis was attained using the previously trained and standardized protocol for fluid localization. The patient was in a supine position and IV access was not attained. The area for paracentesis was then prepped and draped in the usual sterile fashion.    At the site localized for paracentesis, 21mL of 1% lidocaine was used to anesthetize the area selected using an aseptic no touch technique. An 11 gauge scalpel blade was used to make a 2 mm incision at the site previously anaesthetized. The safety-centesis catheter was assembled aseptically and introduced into the peritoneal cavity using again an aseptic no touch technique and connected to vacuum bottles and fluid was removed. A total of 5.8 liters of fluid were removed and replacement was performed with albumin. The patient tolerated the procedure well. I personally performed the procedure.     Unplanned Events: None    Action Plan: admit to hospital      Scot Jun, MD

## 2020-02-10 NOTE — ED Notes
Pt to be admitted, report given to [facility], [place].

## 2020-02-10 NOTE — Response Teams
RRT called to infusion clinic for symptomatic hypotension.  BP 84 systolic.  Pt here to receive 2 units PRBC today for hbg 6.4, only able to get one in clinic.  Provider notified and instructed for pt to transport to Ed at this time. No blood given prior to transport by RRT.

## 2020-02-10 NOTE — ED Provider Notes
Kristin Moody is a 64 y.o. female.    Chief Complaint:  Chief Complaint   Patient presents with   ? Hypotension     Dizzy, SOA x2 days   ? Abnormal Lab     Hgb 6.4       History of Present Illness:  This is a 64 year old female with past medical history as below.  She presents to the [place] [facility] as a rapid response for low blood pressure in the 70s systolic.  She was also found to have a hemoglobin of 5.8.  She states that when she woke up this morning she was feeling dizzy, and this continued in clinic.  She did not experience any episodes of syncope.  Denies any recent fevers or sick contacts.  She does state that her abdomen and lower extremities are feeling more swollen than they normally would.  She is due for her weekly paracentesis with a normally drained off 6 L of fluid.  Patient also receives iron and blood transfusions as a outpatient.    She denies any fevers, chills, hematochezia, melena, nausea/vomiting, headaches/chest pain.          Review of Systems:  Review of Systems   Constitutional: Positive for fatigue. Negative for chills and fever.   HENT: Negative.    Eyes: Negative.    Respiratory: Positive for shortness of breath. Negative for cough and chest tightness.         States she is short of breath due to abdominal distention.   Cardiovascular: Positive for leg swelling. Negative for chest pain.   Gastrointestinal: Positive for abdominal distention and abdominal pain. Negative for blood in stool, nausea, rectal pain and vomiting.   Genitourinary: Negative for flank pain and hematuria.   Musculoskeletal: Negative.    Skin: Negative.    Allergic/Immunologic: Negative.    Neurological: Negative.    Hematological: Negative.    Psychiatric/Behavioral: Negative.        Allergies:  Tegaderm, Adhesive tape (rosins), Sulfa (sulfonamide antibiotics), Codeine, Morphine, Penicillin g, and Tramadol    Past Medical History:  Medical History:   Diagnosis Date   ? Aneurysm (HCC)    ? 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 Diverticulosis of colon     descending and sigmoid colon   ? Dyspnea    ? Essential hypertension 08/13/2012   ? Fatty infiltration of liver    ? HTN (hypertension)    ? Obesity (BMI 30-39.9) 05/13/2011   ? Preop cardiovascular exam 01/07/2019   ? Sinus infection    ? Type 2 diabetes mellitus (HCC) 09/21/2014       Past Surgical History:  Surgical History:   Procedure Laterality Date   ? HYSTERECTOMY  1994   ? UPPER GASTROINTESTINAL ENDOSCOPY  2009   ? COLONOSCOPY  2009   ? CYSTOCELE REPAIR  2009    with endocele repair   ? RECTOCELE REPAIR  03/2009   ? LIVER BIOPSY  07/17/2010   ? COLONOSCOPY N/A 11/08/2017    Performed by Dawna Part, MD at [facility] ENDO   ? ESOPHAGOGASTRODUODENOSCOPY N/A 11/08/2017    Performed by Dawna Part, MD at [facility] ENDO   ? ESOPHAGOGASTRODUODENOSCOPY WITH BIOPSY - FLEXIBLE  11/08/2017    Performed by Dawna Part, MD at [facility] ENDO   ? COLONOSCOPY WITH HOT BIOPSY FORCEPS REMOVAL TUMOR/ POLYP/ OTHER LESION  11/08/2017    Performed by Dawna Part, MD at [facility] ENDO   ?  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 ESOPHAGOGASTRODUODENOSCOPY WITH BIOPSY - FLEXIBLE N/A 04/10/2018    Performed by Normajean Baxter, MD at [facility] ENDO   ? ESOPHAGOGASTRODUODENOSCOPY WITH CONTROL OF BLEEDING - FLEXIBLE N/A 04/25/2018    Performed by Jolee Ewing, MD at [facility] ENDO   ? ESOPHAGOGASTRODUODENOSCOPY WITH BIOPSY - FLEXIBLE with push enteroscopy N/A 08/28/2018    Performed by Celesta Gentile, MD at [facility] ENDO   ? EGD N/A 10/27/2018    Performed by Eliott Nine, MD at [facility] ENDO   ? ESOPHAGOGASTRODUODENOSCOPY WITH SNARE REMOVAL TUMOR/ POLYP/ OTHER LESION - FLEXIBLE N/A 10/27/2018    Performed by Eliott Nine, MD at [facility] ENDO   ? ESOPHAGOGASTRODUODENOSCOPY WITH BIOPSY - FLEXIBLE N/A 12/16/2018    Performed by Buckles, Vinnie Level, MD at [facility] ENDO   ? ESOPHAGOGASTRODUODENOSCOPY WITH CONTROL OF BLEEDING - FLEXIBLE N/A 12/16/2018    Performed by Buckles, Vinnie Level, MD at [facility] ENDO   ? ESOPHAGOGASTRODUODENOSCOPY WITH SPECIMEN COLLECTION BY BRUSHING/ WASHING N/A 01/22/2019    Performed by Veneta Penton, MD at [facility] ENDO   ? ESOPHAGOGASTRODUODENOSCOPY WITH DILATION ESOPHAGUS WITH BALLOON 30 MM OR GREATER - FLEXIBLE N/A 01/22/2019    Performed by Veneta Penton, MD at [facility] ENDO   ? ESOPHAGOGASTRODUODENOSCOPY WITH BIOPSY - FLEXIBLE N/A 01/22/2019    Performed by Veneta Penton, MD at [facility] ENDO   ? ESOPHAGOGASTRODUODENOSCOPY WITH BIOPSY - FLEXIBLE N/A 06/19/2019    Performed by Dawna Part, MD at [facility] ENDO   ? ESOPHAGOGASTRODUODENOSCOPY [WITH APC]  WITH SPECIMEN COLLECTION BY BRUSHING/ WASHING N/A 08/07/2019    Performed by Dawna Part, MD at [facility] ENDO   ? ESOPHAGOGASTRODUODENOSCOPY WITH SPECIMEN COLLECTION BY BRUSHING/ WASHING N/A 09/10/2019    Performed by Buckles, Vinnie Level, MD at [facility] ENDO   ? ESOPHAGOGASTRODUODENOSCOPY WITH CONTROL OF BLEEDING - FLEXIBLE N/A 09/10/2019    Performed by Buckles, Vinnie Level, MD at [facility] ENDO   ? CHOLECYSTECTOMY     ? COLONOSCOPY     ? ESOPHAGOGASTRIC FUNDOPLICATION  2003, 2004    laparoscopic   ? ESOPHAGOGASTRIC FUNDOPLICATION     ? HX HYSTERECTOMY     ? PR ESOPHAGOSCOPY FLEXIBLE TRANSORAL DIAGNOSTIC         Pertinent medical/surgical history reviewed    Social History:  Social History     Tobacco Use   ? Smoking status: Never Smoker   ? Smokeless tobacco: Never Used   Substance Use Topics   ? Alcohol use: No   ? Drug use: No     Social History     Substance and Sexual Activity   Drug Use No       Family History:  Family History   Problem Relation Age of Onset   ? Other Mother    ? Kidney Failure Mother    ? Osteoporosis Mother    ? Cancer Father         esophageal   ? Liver Disease Sister         endstage, s/p liver transplant   ? Cancer Brother         tonsil, liver    ? Hip Fracture Neg Hx        Vitals:  ED Vitals    Date and Time T BP P RR SPO2P SPO2 User   02/10/20 1315 36.4 ?C (97.6 ?F) 94/50 92 26  PER MINUTE -- 99 % MM   02/10/20 1212 36.9 ?C (98.5 ?F) 96/55 92 13 PER MINUTE -- 99 % MM          Physical Exam:  Physical Exam  Constitutional:       General: She is not in acute distress.  HENT:      Head: Normocephalic and atraumatic.      Nose: Nose normal.      Mouth/Throat:      Mouth: Mucous membranes are moist.   Eyes:      Extraocular Movements: Extraocular movements intact.      Pupils: Pupils are equal, round, and reactive to light.   Neck:      Musculoskeletal: Normal range of motion.   Cardiovascular:      Rate and Rhythm: Normal rate and regular rhythm.      Pulses: Normal pulses.   Pulmonary:      Effort: Pulmonary effort is normal.   Abdominal:      General: There is distension.      Tenderness: There is abdominal tenderness. There is no rebound.   Musculoskeletal: Normal range of motion.   Skin:     General: Skin is warm.      Capillary Refill: Capillary refill takes less than 2 seconds.   Neurological:      General: No focal deficit present.      Mental Status: She is alert.   Psychiatric:         Mood and Affect: Mood normal.         Laboratory Results:  Labs Reviewed   COMPREHENSIVE METABOLIC PANEL - Abnormal       Result Value Ref Range Status    Sodium 134 (*) 137 - 147 MMOL/L Final    Potassium 4.5  3.5 - 5.1 MMOL/L Final    Chloride 101  98 - 110 MMOL/L Final    Glucose 111 (*) 70 - 100 MG/DL Final    Blood Urea Nitrogen 35 (*) 7 - 25 MG/DL Final    Creatinine 4.54 (*) 0.4 - 1.00 MG/DL Final    Calcium 8.6  8.5 - 10.6 MG/DL Final    Total Protein 5.9 (*) 6.0 - 8.0 G/DL Final    Total Bilirubin 1.1  0.3 - 1.2 MG/DL Final    Albumin 3.1 (*) 3.5 - 5.0 G/DL Final    Alk Phosphatase 162 (*) 25 - 110 U/L Final    AST (SGOT) 44 (*) 7 - 40 U/L Final    CO2 24  21 - 30 MMOL/L Final    ALT (SGPT) 18  7 - 56 U/L Final    Anion Gap 9  3 - 12 Final    eGFR Non African American 37 (*) >60 mL/min Final    eGFR African American 45 (*) >60 mL/min Final   PROTIME INR (PT) - Abnormal    INR 1.5 (*) 0.8 - 1.2 Final   PERITONEAL FLUID GLUCOSE - Abnormal    Peritoneal Fluid Glucose 142 (*) 70 - 100 mg/dL Final   PERITONEAL FLUID LACTATE DEHYROGENASE - Abnormal    Peritoneal Fluid Lactate Dehyrogenase 36 (*) 67 - 140 U/L Final   POC LACTATE - Abnormal    LACTIC ACID POC 3.2 (*) 0.5 - 2.0 MMOL/L Final   CULTURE-WOUND/TISSUE/FLUID(AEROBIC ONLY)W/SENSITIVITY   GRAM STAIN   COVID-19 (SARS-COV-2) PCR    COVID-19 (SARS-CoV-2) PCR Source     Corrected    Value: FLOCKED SWAB  NASOPHARYNGEAL  COVID-19 (SARS-CoV-2) PCR NOT DETECTED  DN-NOT DETECTED Final   TROPONIN-I    Troponin-I 0.01  0.0 - 0.05 NG/ML Final   PERITONEAL FLUID TOTAL PROTEIN    Peritoneal Fluid Total Protein <1.5  g/dL Final   BLUE TOP TUBE   MINT TOP TUBE   LAVENDER TOP TUBE   POC LACTATE   TYPE & CROSSMATCH   PREPARE RBC'S   TRANSFUSE RBC'S   TRANSFUSE RBC'S          Radiology Interpretation:    POC ED US GUIDED PARACENTESIS    (Results Pending)         EKG:    Rate 87, PR interval 136, QTc 504, left axis deviation, no gross ST abnormalities.    ED Course:    - Available records were reviewed.  - Upon arrival to the ED patient was seen and evaluated by resident and attending physicians.  Thorough history and physical examination were conducted.  Vital signs were significant for systolic blood pressure in the high 80s and 90s.  He was continuously monitored closely in the emergency department.  She received 2 units of blood.  Paracentesis was performed due to concerns of low blood pressure and possibility of SBP.  Results were not concerning for SBP.  Patient was then admitted to internal medicine for further management.  - Dispo: Admission      ED Course as of Feb 10 2012   Wed Feb 10, 2020   1800 PERITONEAL FLUID LD(!) [MS]      ED Course User Index  [MS] Hoy Register, MD       ED Scoring:                             Coding    Facility Administered Meds:  Medications   lidocaine 1%/EPINEPHrine 1:100,000 injection 20 mL (20 mL Injection Given 02/10/20 1353)   albumin 25% injection 50 g (0 g Intravenous Infusion Stopped 02/10/20 1754)         Clinical Impression:  Clinical Impression   None       Disposition/Follow up  ED Disposition     ED Disposition    Admit        No follow-up provider specified.    Medications:  Current Discharge Medication List          Procedure Notes:  Paracentesis    Date/Time: 02/10/2020 2:38 PM  Performed by: Hoy Register, MD  Authorized by: Hinton Rao, MD   Consent: Verbal consent obtained. Written consent obtained.  Consent given by: patient  Patient understanding: patient states understanding of the procedure being performed  Patient consent: the patient's understanding of the procedure matches consent given  Site marked: the operative site was marked  Imaging studies: imaging studies available  Patient identity confirmed: verbally with patient, arm band and hospital-assigned identification number  Initial or subsequent exam: initial  Procedure purpose: diagnostic  Indications: abdominal discomfort secondary to ascites and secondary bacterial peritonitis  Anesthesia: local infiltration    Anesthesia:  Local Anesthetic: lidocaine 1% with epinephrine  Anesthetic total: 4 mL    Sedation:  Patient sedated: no    Preparation: Patient was prepped and draped in the usual sterile fashion.  Needle gauge: 22  Ultrasound guidance: yes  Puncture site: right lower quadrant  Fluid removed: 15(ml)  Fluid appearance: cloudy  Dressing: 4x4 sterile gauze  Patient tolerance: patient tolerated the procedure well with no immediate  complications            Hoy Register, MD

## 2020-02-11 ENCOUNTER — Encounter: Admit: 2020-02-11 | Discharge: 2020-02-11 | Payer: MEDICARE

## 2020-02-11 LAB — CELL COUNT W/DIFF-FLUIDS
Lab: 400 /uL
Lab: 50 /uL

## 2020-02-11 LAB — GRAM STAIN

## 2020-02-11 MED ORDER — LACTATED RINGERS IV SOLP
INTRAVENOUS | 0 refills | Status: CN
Start: 2020-02-11 — End: ?

## 2020-02-11 NOTE — Progress Notes
patient admitted to [place], [place] now 20. Patient has been updated in [facility] and [place] waitlist

## 2020-02-12 ENCOUNTER — Inpatient Hospital Stay: Admit: 2020-02-12 | Discharge: 2020-02-12 | Payer: MEDICARE

## 2020-02-12 ENCOUNTER — Encounter: Admit: 2020-02-12 | Discharge: 2020-02-12 | Payer: MEDICARE

## 2020-02-12 DIAGNOSIS — I10 Essential (primary) hypertension: Secondary | ICD-10-CM

## 2020-02-12 DIAGNOSIS — Z01810 Encounter for preprocedural cardiovascular examination: Secondary | ICD-10-CM

## 2020-02-12 DIAGNOSIS — I729 Aneurysm of unspecified site: Secondary | ICD-10-CM

## 2020-02-12 DIAGNOSIS — K31819 Angiodysplasia of stomach and duodenum without bleeding: Secondary | ICD-10-CM

## 2020-02-12 DIAGNOSIS — K5730 Diverticulosis of large intestine without perforation or abscess without bleeding: Secondary | ICD-10-CM

## 2020-02-12 DIAGNOSIS — R0600 Dyspnea, unspecified: Secondary | ICD-10-CM

## 2020-02-12 DIAGNOSIS — K7460 Unspecified cirrhosis of liver: Secondary | ICD-10-CM

## 2020-02-12 DIAGNOSIS — E119 Type 2 diabetes mellitus without complications: Secondary | ICD-10-CM

## 2020-02-12 DIAGNOSIS — E669 Obesity, unspecified: Secondary | ICD-10-CM

## 2020-02-12 DIAGNOSIS — J329 Chronic sinusitis, unspecified: Secondary | ICD-10-CM

## 2020-02-12 DIAGNOSIS — K760 Fatty (change of) liver, not elsewhere classified: Secondary | ICD-10-CM

## 2020-02-12 MED ORDER — PROMETHAZINE 25 MG/ML IJ SOLN
6.25 mg | INTRAVENOUS | 0 refills | Status: CN | PRN
Start: 2020-02-12 — End: ?

## 2020-02-12 MED ORDER — LACTATED RINGERS IV SOLP
INTRAVENOUS | 0 refills | Status: CN
Start: 2020-02-12 — End: ?

## 2020-02-12 MED ORDER — PROPOFOL 10 MG/ML IV EMUL 20 ML (INFUSION)(AM)(OR)
INTRAVENOUS | 0 refills | Status: DC
Start: 2020-02-12 — End: 2020-02-12
  Administered 2020-02-12: 14:00:00 140 ug/kg/min via INTRAVENOUS

## 2020-02-12 MED ORDER — PROPOFOL INJ 10 MG/ML IV VIAL
0 refills | Status: DC
Start: 2020-02-12 — End: 2020-02-12
  Administered 2020-02-12: 14:00:00 10 mg via INTRAVENOUS
  Administered 2020-02-12: 14:00:00 60 mg via INTRAVENOUS

## 2020-02-12 MED ORDER — FENTANYL CITRATE (PF) 50 MCG/ML IJ SOLN
50 ug | INTRAVENOUS | 0 refills | Status: CN | PRN
Start: 2020-02-12 — End: ?

## 2020-02-12 MED ORDER — PHENYLEPHRINE HCL IN 0.9% NACL 1 MG/10 ML (100 MCG/ML) IV SYRG
0 refills | Status: DC
Start: 2020-02-12 — End: 2020-02-12
  Administered 2020-02-12 (×2): 100 ug via INTRAVENOUS

## 2020-02-12 MED ORDER — LACTATED RINGERS IV SOLP
1000 mL | Freq: Once | INTRAVENOUS | 0 refills | Status: CP
Start: 2020-02-12 — End: ?
  Administered 2020-02-12: 13:00:00 1000 mL via INTRAVENOUS

## 2020-02-12 MED ORDER — LIDOCAINE (PF) 200 MG/10 ML (2 %) IJ SYRG
0 refills | Status: DC
Start: 2020-02-12 — End: 2020-02-12
  Administered 2020-02-12: 14:00:00 60 mg via INTRAVENOUS

## 2020-02-12 MED ORDER — LACTATED RINGERS IV SOLP (OR) 500ML
0 refills | Status: DC
Start: 2020-02-12 — End: 2020-02-12
  Administered 2020-02-12: 14:00:00 via INTRAVENOUS

## 2020-02-12 NOTE — Anesthesia Post-Procedure Evaluation
Post-Anesthesia Evaluation    Name: Kristin Moody      MRN: [id number]     DOB: 1956-09-13     Age: 64 y.o.     Sex: female   __________________________________________________________________________     Procedure Information     Anesthesia Start Date/Time: 02/12/20 0907    Procedure: ESOPHAGOGASTRODUODENOSCOPY WITH CONTROL OF BLEEDING - FLEXIBLE (N/A ) - hx of GAVE    Location: ENDO 3 / ENDO/GI    Surgeons: Marrianne Mood, MD          Post-Anesthesia Vitals  BP: 96/55 (03/26 0955)  Temp: 36.7 C (98.1 F) (03/26 0933)  Pulse: 87 (03/26 0955)  Respirations: 18 PER MINUTE (03/26 0955)  SpO2: 96 % (03/26 0955)  SpO2 Pulse: 86 (03/26 0955)   Vitals Value Taken Time   BP 96/55 02/12/20 0955   Temp 36.7 C (98.1 F) 02/12/20 0933   Pulse 87 02/12/20 0955   Respirations 18 PER MINUTE 02/12/20 0955   SpO2 96 % 02/12/20 0955         PostAnes Eval    Perioperative Events  Perioperative Event: No  Emergency Case Activation: No

## 2020-02-13 ENCOUNTER — Encounter: Admit: 2020-02-13 | Discharge: 2020-02-13 | Payer: MEDICARE

## 2020-02-13 DIAGNOSIS — Z01810 Encounter for preprocedural cardiovascular examination: Secondary | ICD-10-CM

## 2020-02-13 DIAGNOSIS — K5730 Diverticulosis of large intestine without perforation or abscess without bleeding: Secondary | ICD-10-CM

## 2020-02-13 DIAGNOSIS — R0600 Dyspnea, unspecified: Secondary | ICD-10-CM

## 2020-02-13 DIAGNOSIS — E669 Obesity, unspecified: Secondary | ICD-10-CM

## 2020-02-13 DIAGNOSIS — I10 Essential (primary) hypertension: Secondary | ICD-10-CM

## 2020-02-13 DIAGNOSIS — E119 Type 2 diabetes mellitus without complications: Secondary | ICD-10-CM

## 2020-02-13 DIAGNOSIS — K760 Fatty (change of) liver, not elsewhere classified: Secondary | ICD-10-CM

## 2020-02-13 DIAGNOSIS — K7460 Unspecified cirrhosis of liver: Secondary | ICD-10-CM

## 2020-02-13 DIAGNOSIS — J329 Chronic sinusitis, unspecified: Secondary | ICD-10-CM

## 2020-02-13 DIAGNOSIS — I729 Aneurysm of unspecified site: Secondary | ICD-10-CM

## 2020-02-15 ENCOUNTER — Encounter: Admit: 2020-02-15 | Discharge: 2020-02-15 | Payer: MEDICARE

## 2020-02-15 ENCOUNTER — Ambulatory Visit: Admit: 2020-02-15 | Discharge: 2020-02-15 | Payer: MEDICARE

## 2020-02-15 DIAGNOSIS — K31819 Angiodysplasia of stomach and duodenum without bleeding: Secondary | ICD-10-CM

## 2020-02-15 NOTE — Telephone Encounter
Spoke with patient post hospital discharge.  She will have repeat labs 3/30 (weekly labs per transplant clinic).   She is aware of upcoming [place] visit with Garner Nash 5/7 @ 11:00.   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 She is agreeable to this. Prep for case has been entered.

## 2020-02-16 ENCOUNTER — Encounter: Admit: 2020-02-16 | Discharge: 2020-02-16 | Payer: MEDICARE

## 2020-02-16 NOTE — Progress Notes
Interventional Radiology Outpatient Scheduling Checklist      1.  Name of Procedure(s):   Paracentesis      2.  Date of Procedure:   02/19/20      3.  Arrival Time:   9150      4.  Procedure Time:  1364      3.  Correct Procedural Room Assignment:  [place] 7      6.  Blood Thinners Triaged and instructed per protocol: Y/N/NA:  NA  Confirmed accurate instructions sent to patient: Y/N:  NA       7.  Procedure Order Verified: Y/N:  Yes      9.  Patient instructed to have a driver: Y/N/NA:  Yes    10.  Patient instructed on NPO status: Y/N/NA:  No dietary restrictions  Confirmed accurate instructions sent to patient: Y/N:  Yes    11.  Specimen needed: Y/N/NA:  Yes   Verified Order placed: Y/N:  Yes    12.  Allergies Verified:  Y/N:  Yes    13.  Is there an Iodine Allergy: Y/N:  No  Does the Procedure Require contrast: Y/N:  No  If so, was the IR- Contrast Allergy Pre-Procedure Medication protocol ordered: Y/NA:  NA    14.  Does the patient have labs according to IR Pre-procedure Laboratory Parameter policy: Y/N/NA:  Yes  If No, was the patient instructed to obtain labs prior to procedure: Y/N/NA:  NA     15.  Will the patient need to be admitted or have a possible admission: Y/N:  No  If yes, confirmed accurate instructions sent to patient: Y/N/NA:  NA     16.  Patient States Understanding: Y/N:  Yes    17.  History of OSA:  Y/N:  No  If yes, confirm request to bring CPAP sent to patient: Y/N/NA:  NA    18. Patient declines electronic procedure instructions: Y/N:  No

## 2020-02-16 NOTE — Telephone Encounter
Patient stated had labs done 02/16/20.

## 2020-02-16 NOTE — Patient Education
Dear Ms. Fazzino,         Thank you for choosing [facility] Interventional Radiology for your procedure. Your appointment information is listed below:    Appointment Date: 02/19/20  Appointment Time: 1:30pm  Arrival Time:12:30pm  Location:     ? [place]: [address], [place], [place]  [address]  Parking: [address]    INTERVENTIONAL RADIOLOGY  PRE-PROCEDURE INSTRUCTIONS     You are scheduled for a procedure in Interventional Radiology.  Please follow these instructions and any direction from your Primary Care/Managing Physician.  If you have questions about your procedure or need to reschedule please call [phone].    Medication Instructions:   You may take the following medications with a small sip of water:  Continue your regular medications as directed.     Diet Instructions: No dietary restrictions   Day of Exam Instructions:  1. Bathe or shower with an antibacterial soap prior to your appointment.  2. Bring a list of your current medications and the dosages.  3. Wear comfortable clothing and leave valuables at home.  4. Arrive 1 hour prior to your appointment.  This time will be spent registering, interviewing, assessing, educating and preparing you for the test.  ? You will be with [place] anywhere from 30 minutes to 6 hours after your exam depending on your procedure.    Interventional Radiology Team  Perioperative and Procedural Scheduling Department  [facility] [place]  Ph: [phone]

## 2020-02-16 NOTE — Telephone Encounter
labs not received. RN called [facility] lab, who confirmed they will fax results now to [phone].  Awaiting result review.

## 2020-02-16 NOTE — Telephone Encounter
CBC reviewed in Lab [place], hgb 8.5.  Patient advised to have labs re-drawn on Friday 4/2 at [facility] during para appt. She v/u. message sent to IR team to add labs.

## 2020-02-17 ENCOUNTER — Encounter: Admit: 2020-02-17 | Discharge: 2020-02-17 | Payer: MEDICARE

## 2020-02-17 DIAGNOSIS — K31819 Angiodysplasia of stomach and duodenum without bleeding: Secondary | ICD-10-CM

## 2020-02-17 LAB — CBC AND DIFF
Lab: 0.8 10*3/uL — ABNORMAL LOW (ref 0.9–5.1)
Lab: 1.4 % (ref 0–2)
Lab: 10 % — ABNORMAL HIGH (ref 0.0–10.0)
Lab: 12 % — ABNORMAL LOW (ref 18.0–47.0)
Lab: 183 FL (ref 7–11)
Lab: 21 K/UL — ABNORMAL HIGH (ref 11.5–14.5)
Lab: 24 g/dL — ABNORMAL LOW (ref 27.0–31.0)
Lab: 28 FL — ABNORMAL LOW (ref 37.0–47.0)
Lab: 3.4 g/dL — ABNORMAL LOW (ref 4.20–5.40)
Lab: 4 % (ref 0–5)
Lab: 4.9 10*3/uL — ABNORMAL HIGH (ref 1.8–7.0)
Lab: 6.8 M/UL — ABNORMAL LOW (ref 4.4–5.5)
Lab: 71 % — ABNORMAL HIGH (ref 41–77)
Lab: 8.5 % — ABNORMAL LOW (ref 12.0–16.0)
Lab: 81 pg (ref 26–34)

## 2020-02-19 ENCOUNTER — Encounter: Admit: 2020-02-19 | Discharge: 2020-02-19 | Payer: MEDICARE

## 2020-02-19 ENCOUNTER — Ambulatory Visit: Admit: 2020-02-19 | Discharge: 2020-02-19 | Payer: MEDICARE

## 2020-02-19 DIAGNOSIS — K5730 Diverticulosis of large intestine without perforation or abscess without bleeding: Secondary | ICD-10-CM

## 2020-02-19 DIAGNOSIS — K760 Fatty (change of) liver, not elsewhere classified: Secondary | ICD-10-CM

## 2020-02-19 DIAGNOSIS — E119 Type 2 diabetes mellitus without complications: Secondary | ICD-10-CM

## 2020-02-19 DIAGNOSIS — I729 Aneurysm of unspecified site: Secondary | ICD-10-CM

## 2020-02-19 DIAGNOSIS — I10 Essential (primary) hypertension: Secondary | ICD-10-CM

## 2020-02-19 DIAGNOSIS — K31819 Angiodysplasia of stomach and duodenum without bleeding: Secondary | ICD-10-CM

## 2020-02-19 DIAGNOSIS — R0600 Dyspnea, unspecified: Secondary | ICD-10-CM

## 2020-02-19 DIAGNOSIS — J329 Chronic sinusitis, unspecified: Secondary | ICD-10-CM

## 2020-02-19 DIAGNOSIS — K7460 Unspecified cirrhosis of liver: Secondary | ICD-10-CM

## 2020-02-19 DIAGNOSIS — K7469 Other cirrhosis of liver: Secondary | ICD-10-CM

## 2020-02-19 DIAGNOSIS — Z01810 Encounter for preprocedural cardiovascular examination: Secondary | ICD-10-CM

## 2020-02-19 DIAGNOSIS — D500 Iron deficiency anemia secondary to blood loss (chronic): Secondary | ICD-10-CM

## 2020-02-19 DIAGNOSIS — E669 Obesity, unspecified: Secondary | ICD-10-CM

## 2020-02-19 LAB — COMPREHENSIVE METABOLIC PANEL
Lab: 1.2 mg/dL — ABNORMAL HIGH (ref 0.57–1.11)
Lab: 31 mg/dL — ABNORMAL HIGH (ref 9.8–20.1)
Lab: 1 mg/dL — ABNORMAL HIGH (ref 0.4–1.00)
Lab: 1.1 mg/dL (ref 0.3–1.2)
Lab: 105 mg/dL — ABNORMAL HIGH (ref 70–100)
Lab: 107 MMOL/L (ref 98–110)
Lab: 135 MMOL/L — ABNORMAL LOW (ref 137–147)
Lab: 3 g/dL — ABNORMAL LOW (ref 3.5–5.0)
Lab: 33 mg/dL — ABNORMAL HIGH (ref 7–25)
Lab: 4.8 MMOL/L (ref 3.5–5.1)
Lab: 5.7 g/dL — ABNORMAL LOW (ref 6.0–8.0)
Lab: 8.5 mg/dL (ref 8.5–10.6)

## 2020-02-19 LAB — PROTIME INR (PT): Lab: 1.4 U/L — ABNORMAL HIGH (ref 0.8–1.2)

## 2020-02-19 LAB — CBC AND DIFF: Lab: 7.4 10*3/uL — ABNORMAL HIGH (ref 4.5–11.0)

## 2020-02-19 MED ORDER — ALBUMIN, HUMAN 25 % IV SOLP
0 refills | Status: CP
Start: 2020-02-19 — End: ?
  Administered 2020-02-19: 19:00:00 37.5 g via INTRAVENOUS

## 2020-02-19 NOTE — Other
Immediate Post Procedure Note    Date:  02/19/2020                                         Performing Provider:  Shearon Balo, MD  Attending Physician:  Ronny Flurry, MD    Consent:  Consent obtained from patient.  Time out performed: Consent obtained, correct patient verified, correct procedure verified, correct site verified, patient marked as necessary.  Pre/Post Procedure Diagnosis/Indication:  Ascites    Anesthesia: Conscious Sedation  Procedure(s):  IR Aspiration/Drain   Findings: Successful ultrasound guided paracentesis. Please see separate PACS dictation for further detail.    Estimated Blood Loss:  None/Negligible  Specimen(s) Removed/Disposition:  Yes, sent to pathology  Complications: None  Patient Tolerated Procedure: Well  Post-Procedure Condition:  Stable    Shearon Balo, MD

## 2020-02-19 NOTE — Patient Instructions
INSTRUCCIONES PARA EL ALTA DE RADIOLOG?A INTERVENCIONISTA   PARACENTESIS   Una paracentesis es la extracci?n de una acumulaci?n anormal de l?quido en su cavidad abdominal. Esta acumulaci?n de l?quido se llama ascitis y puede ser causada por afecciones tales como enfermedades del h?gado, insuficiencia card?aca o c?ncer. Durante este procedimiento, se inserta una aguja en su abdomen para drenar el l?quido. Posteriormente, el l?quido puede enviarse al laboratorio para ser [employer], si as? se recomienda desde el punto de vista m?dico. La extracci?n del l?quido tambi?n puede aliviar la presi?n del vientre y la dificultad para respirar causada por la ascitis.   ?  DOLOR DESPU?S DEL PROCEDIMIENTO:   ? El control del dolor despu?s del procedimiento es una prioridad tanto para usted como para sus m?dicos.  ? Es de esperar que sienta algo de dolor o sensibilidad en el lugar durante varios d?as. Recomendamos tomar analg?sicos de venta libre para ayudar a Electrical engineer.  ? Los m?todos alternativos para el alivio del dolor [place] otros, compresas de calor o fr?o, t?cnicas de relajaci?n, descanso y cambio de posiciones.  ? Si el dolor persiste despu?s de 5 a 7 d?as o si tiene un dolor intenso que no se alivia con medicaci?n, p?ngase en contacto con nosotros como se indica a continuaci?n.  ?  ACTIVIDAD DESPU?S DEL PROCEDIMIENTO:   ? Un adulto responsable debe acompa?arlo a su casa.  ? Si se le administran sedantes, medicamentos narc?ticos para el dolor o anestesia para el procedimiento, no debe conducir ni operar maquinaria pesada ni hacer nada que requiera concentraci?n durante al menos 24 horas despu?s de la finalizaci?n del procedimiento.  ? Se recomienda que un adulto responsable se quede con usted hasta la ma?ana.  ?  CUIDADO DEL SITIO DESPU?S DEL PROCEDIMIENTO:   ? Se le colocar? un peque?o vendaje sobre el sitio del procedimiento. Mant?ngalo seco.  ? Puede retirarlo a las 24 horas.  ? Puede ducharse al cabo de 24 horas, despu?s de retirar el vendaje.  ? Se puede volver a aplicar un vendaje de gasa seca seg?n sea necesario para proteger la ropa, ya que a veces el sitio puede estar goteando durante varios d?as despu?s del procedimiento.  ? No sumerja el sitio del procedimiento bajo el agua (no se recomienda hacer ba?os de inmersi?n, nadar, usar [employer], etc.)  ? No aplique pomadas, cremas o polvos en el lugar de la punci?n.  ? Aseg?rese de tener las manos limpias al tocar cerca del sitio.  ?  DIETA/MEDICAMENTOS:   ? Puede continuar con su dieta anterior despu?s del procedimiento.  ? Si le administran sedantes o medicamentos narc?ticos para Chief Technology Officer, evite cualquier alimento o bebida que contenga alcohol durante al menos 24 horas despu?s del procedimiento.  ? Consulte el registro de conciliaci?n de f?rmacos para obtener instrucciones sobre c?mo reanudar la toma de Technical sales engineer.  ?  LLAME AL M?DICO SI:   ? Observa sangre roja brillante que humedece el vendaje.  ? Tiene dolor que no se alivia con la medicaci?n. Es de Youth worker que sienta un poco de dolor en la zona.  ? Tiene s?ntomas de infecci?n como: fiebre superior a 101? F (38? C), escalofr?os, enrojecimiento, calor, hinchaz?n, drenaje o pus en el sitio de la punci?n.  ?  Para cualquiera de los s?ntomas anteriores o para problemas o preocupaciones relacionadas con el procedimiento realizado en la sede de [place], [place] al [phone] de lunes a viernes de 7 a. m. a 5 p. m.  Fuera del horario de atenci?n y los fines de [place], debe llamar al [phone] y pedir por el Residente de Radiolog?a Intervencionista de guardia.   Usted o su cuidador deben llamar al 911 por cualquier s?ntoma grave como sangrado excesivo, mareos severos, dificultad para respirar o p?rdida de conciencia.   ?

## 2020-02-19 NOTE — H&P (View-Only)
IR Pre-Procedure History and Physical/Sedation Plan    Procedure Date: 02/19/2020     Planned Procedure(s):  Ultrasound guided paracentesis     Procedural code status: Prior    Indication:  Ascites   __________________________________________________________________    Chief Complaint:  Ascites, ESLD    History of Present Illness: Kristin Moody is a 64 y.o. female with a history as listed below who presents today for procedure. She c/o of abdominal distension on exam. Recently had EGD with GI; no interventions performed. 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     Patient Active Problem List    Diagnosis Date Noted   ? Hypotension 02/10/2020   ? 'light-for-dates' infant with signs of fetal malnutrition 01/22/2020   ? 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 Anemia 09/28/2019   ? Encephalopathy 08/12/2019   ? Spasticity 08/10/2019   ? Hyperreflexia 08/10/2019   ? 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 Right leg weakness 05/14/2019   ? 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 Iron (Fe) deficiency anemia 01/19/2019   ? Preop cardiovascular exam 01/07/2019   ? Pre-transplant evaluation for liver transplant 01/06/2019   ? Cirrhosis (HCC) 12/16/2018   ? Acute on chronic anemia 12/16/2018   ? Lactic acidosis 12/16/2018   ? GI bleed 10/24/2018   ? Portal hypertension (HCC) 08/28/2018   ? Confusion 08/27/2018   ? Dyspnea 08/27/2018   ? Chronic abdominal pain 08/27/2018   ? 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 Acute on chronic blood loss anemia 08/27/2018   ? Iron deficiency anemia 04/13/2018   ? Pneumonia due to infectious organism 04/13/2018   ? Melena 04/10/2018   ? Esophageal varices without bleeding (HCC) 02/25/2018   ? Cirrhosis of liver without ascites (HCC) 02/25/2018   ? 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 Lower abdominal pain 10/24/2014   ? Chest discomfort 09/21/2014   ? Essential hypertension 08/13/2012   ? Abnormal liver function tests 05/13/2011   ? Fatty liver disease, nonalcoholic 05/13/2011   ? Obesity (BMI 30-39.9) 05/13/2011   ? Slow transit constipation 05/13/2011     Medical History:   Diagnosis Date   ? Aneurysm (HCC)    ? 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 Diverticulosis of colon     descending and sigmoid colon   ? Dyspnea    ? Essential hypertension 08/13/2012   ? Fatty infiltration of liver    ? HTN (hypertension)    ? Obesity (BMI 30-39.9) 05/13/2011   ? Preop cardiovascular exam 01/07/2019   ? Sinus infection    ? Type 2 diabetes mellitus (HCC) 09/21/2014      Surgical History:   Procedure Laterality Date   ? HYSTERECTOMY  1994   ? UPPER GASTROINTESTINAL ENDOSCOPY  2009   ? COLONOSCOPY  2009   ? CYSTOCELE REPAIR  2009    with endocele repair   ? RECTOCELE REPAIR  03/2009   ? LIVER BIOPSY  07/17/2010   ? COLONOSCOPY N/A 11/08/2017    Performed by Dawna Part, MD at [facility] ENDO   ? ESOPHAGOGASTRODUODENOSCOPY N/A 11/08/2017    Performed by Dawna Part, MD at [facility] ENDO   ? ESOPHAGOGASTRODUODENOSCOPY WITH BIOPSY - FLEXIBLE  11/08/2017    Performed by Dawna Part, MD at [facility] ENDO   ? COLONOSCOPY WITH HOT BIOPSY FORCEPS REMOVAL TUMOR/ POLYP/ OTHER LESION  11/08/2017    Performed  by Dawna Part, MD at [facility] ENDO   ? ESOPHAGOGASTRODUODENOSCOPY WITH BAND LIGATION ESOPHAGEAL/ GASTRIC VARICES - FLEXIBLE N/A 04/10/2018    Performed by Normajean Baxter, MD at [facility] ENDO   ? ESOPHAGOGASTRODUODENOSCOPY WITH BIOPSY - FLEXIBLE N/A 04/10/2018    Performed by Normajean Baxter, MD at [facility] ENDO   ? ESOPHAGOGASTRODUODENOSCOPY WITH CONTROL OF BLEEDING - FLEXIBLE N/A 04/25/2018    Performed by Jolee Ewing, MD at [facility] ENDO   ? ESOPHAGOGASTRODUODENOSCOPY WITH BIOPSY - FLEXIBLE with push enteroscopy N/A 08/28/2018    Performed by Celesta Gentile, MD at [facility] ENDO   ? EGD N/A 10/27/2018    Performed by Eliott Nine, MD at [facility] ENDO   ? ESOPHAGOGASTRODUODENOSCOPY WITH SNARE REMOVAL TUMOR/ POLYP/ OTHER LESION - FLEXIBLE N/A 10/27/2018    Performed by Eliott Nine, MD at [facility] ENDO   ? ESOPHAGOGASTRODUODENOSCOPY WITH BIOPSY - FLEXIBLE N/A 12/16/2018    Performed by Buckles, Vinnie Level, MD at [facility] ENDO   ? ESOPHAGOGASTRODUODENOSCOPY WITH CONTROL OF BLEEDING - FLEXIBLE N/A 12/16/2018    Performed by Buckles, Vinnie Level, MD at [facility] ENDO   ? ESOPHAGOGASTRODUODENOSCOPY WITH SPECIMEN COLLECTION BY BRUSHING/ WASHING N/A 01/22/2019    Performed by Veneta Penton, MD at [facility] ENDO   ? ESOPHAGOGASTRODUODENOSCOPY WITH DILATION ESOPHAGUS WITH BALLOON 30 MM OR GREATER - FLEXIBLE N/A 01/22/2019    Performed by Veneta Penton, MD at [facility] ENDO   ? ESOPHAGOGASTRODUODENOSCOPY WITH BIOPSY - FLEXIBLE N/A 01/22/2019    Performed by Veneta Penton, MD at [facility] ENDO   ? ESOPHAGOGASTRODUODENOSCOPY WITH BIOPSY - FLEXIBLE N/A 06/19/2019    Performed by Dawna Part, MD at [facility] ENDO   ? ESOPHAGOGASTRODUODENOSCOPY [WITH APC]  WITH SPECIMEN COLLECTION BY BRUSHING/ WASHING N/A 08/07/2019    Performed by Dawna Part, MD at [facility] ENDO   ? ESOPHAGOGASTRODUODENOSCOPY WITH SPECIMEN COLLECTION BY BRUSHING/ WASHING N/A 09/10/2019    Performed by Buckles, Vinnie Level, MD at [facility] ENDO   ? ESOPHAGOGASTRODUODENOSCOPY WITH CONTROL OF BLEEDING - FLEXIBLE N/A 09/10/2019    Performed by Buckles, Vinnie Level, MD at [facility] ENDO   ? ESOPHAGOGASTRODUODENOSCOPY WITH CONTROL OF BLEEDING - FLEXIBLE N/A 02/12/2020    Performed by Lenor Derrick, MD at [facility] ENDO   ? CHOLECYSTECTOMY     ? COLONOSCOPY     ? ESOPHAGOGASTRIC FUNDOPLICATION  2003, 2004    laparoscopic   ? ESOPHAGOGASTRIC FUNDOPLICATION     ? HX HYSTERECTOMY     ? PR ESOPHAGOSCOPY FLEXIBLE TRANSORAL DIAGNOSTIC        Social History     Tobacco Use   ? Smoking status: Never Smoker   ? Smokeless tobacco: Never Used   Substance Use Topics   ? Alcohol use: No      Family History   Problem Relation Age of Onset   ? Other Mother    ? Kidney Failure Mother    ? Osteoporosis Mother    ? Cancer Father         esophageal   ? Liver Disease Sister         endstage, s/p liver transplant   ? Cancer Brother tonsil, liver    ? Hip Fracture Neg Hx       Medications Prior to Admission   Medication Sig Dispense Refill Last Dose   ? atorvastatin (LIPITOR) 10 mg tablet TAKE 1 TABLET BY MOUTH EVERY DAY 90 tablet 3 02/19/2020   ? cetirizine (ZYRTEC) 10 mg tablet Take 10 mg by  mouth daily as needed for Allergy symptoms.   02/19/2020   ? ciprofloxacin (CIPRO) 500 mg tablet Take one tablet by mouth daily. 90 tablet 3 02/19/2020   ? duloxetine DR (CYMBALTA) 60 mg capsule Take 60 mg by mouth daily.   02/19/2020   ? furosemide (LASIX) 20 mg tablet Take two tablets by mouth every morning. (Patient taking differently: Take 80 mg by mouth every morning.) 270 tablet 1 02/19/2020   ? pantoprazole DR (PROTONIX) 40 mg tablet Take one tablet by mouth twice daily. 60 tablet 3 02/19/2020   ? polyethylene glycol 3350 (MIRALAX) 17 g packet Take one packet by mouth twice daily.   02/18/2020   ? potassium chloride (K-TAB) 20 mEq tablet Take one tablet by mouth daily. Take with a meal and a full glass of water. 90 tablet 0 02/19/2020   ? promethazine (PHENERGAN) 25 mg tablet Take 25 mg by mouth every 4 hours as needed. Just uses it with Tramadol   02/19/2020   ? rifAXIMin (XIFAXAN) 550 mg tablet Take one tablet by mouth twice daily. 180 tablet 3 02/19/2020   ? zinc sulfate 220 mg (50 mg elemental zinc) capsule Take one capsule by mouth daily. 90 capsule 3 02/19/2020     Allergies   Allergen Reactions   ? Tegaderm BLISTERS   ? Adhesive Tape (Rosins) RASH   ? Sulfa (Sulfonamide Antibiotics) RASH   ? Codeine NAUSEA AND VOMITING   ? Morphine SEE COMMENTS     Pt reports burns her veins   ? Penicillin G SEE COMMENTS     Throat issues, blisters in throat    ? Tramadol NAUSEA ONLY and ITCHING     Takes this medication regularly        Review of Systems  A comprehensive review of systems was negative.    Previous Personal Anesthetic/Sedation History:  Denies adverse events related to sedation/anesthesia.     Previous Family Anesthetic/Sedation History: Denies adverse events related to sedation/anesthesia.    Physical Exam:  Vital Signs: Last Filed In 24 Hours Vital Signs: 24 Hour Range                General appearance: Alert and no distress noted.  Neurologic: Grossly normal.  Lungs: Non labored at rest.  Heart: Regular rate and rhythm  Abdomen: Non-distended    Airway: airway assessment performed  Mallampati II (soft palate, uvula, fauces visible)  Head and Neck: no abnormalities noted  Mouth: no abnormalities noted  NPO status: Acceptable  Pregnancy Status: Not Pregnant  Anesthesia Classification:  ASA III (A patient with a severe systemic disease that limits activity, but is not incapacitating)  Pre-operative anxiolysis Plan: N/A  Sedation/Medication Plan: Lidocaine  Discussion/Reviews:  Physician has discussed risks and alternatives of this type of sedation and above planned procedures with patient    Lab/Radiology/Other Diagnostic Tests:  Labs:  Pertinent labs reviewed           Pollyann Kennedy, APRN-NP  Pager [phone]

## 2020-02-20 LAB — GRAM STAIN

## 2020-02-23 ENCOUNTER — Encounter: Admit: 2020-02-23 | Discharge: 2020-02-23 | Payer: MEDICARE

## 2020-02-23 DIAGNOSIS — R945 Abnormal results of liver function studies: Secondary | ICD-10-CM

## 2020-02-23 DIAGNOSIS — K31819 Angiodysplasia of stomach and duodenum without bleeding: Secondary | ICD-10-CM

## 2020-02-23 NOTE — Telephone Encounter
Kristin Moody is at the lab at the [facility] in [place].  Her standing orders have expired.  She will wait a bit to see if they can be faxed.  Please return her call.

## 2020-02-23 NOTE — Telephone Encounter
spoke with Elane Fritz, faxed new standing orders to [phone] fax confirmation received

## 2020-02-25 ENCOUNTER — Ambulatory Visit: Admit: 2020-02-25 | Discharge: 2020-02-25 | Payer: MEDICARE

## 2020-02-25 ENCOUNTER — Inpatient Hospital Stay: Admit: 2020-02-25 | Payer: MEDICARE

## 2020-02-25 ENCOUNTER — Encounter: Admit: 2020-02-25 | Discharge: 2020-02-25 | Payer: MEDICARE

## 2020-02-25 LAB — COMPREHENSIVE METABOLIC PANEL
Lab: 1.2 mg/dL — ABNORMAL LOW (ref 0.2–1.2)
Lab: 11 meq/L — ABNORMAL LOW (ref 0–14)
Lab: 111 mmol/L — ABNORMAL HIGH (ref 98–107)
Lab: 134 mmol/L — ABNORMAL LOW (ref 136–145)
Lab: 17 mmol/L — ABNORMAL LOW (ref 23–31)
Lab: 172 mg/dL — ABNORMAL HIGH (ref 70–105)
Lab: 193 U/L — ABNORMAL HIGH (ref 40–150)
Lab: 2.7 g/dL — ABNORMAL LOW (ref 3.4–4.8)
Lab: 24 U/L (ref 0–55)
Lab: 29 g/dL — ABNORMAL LOW (ref 30.0–34.0)
Lab: 4.9 mmol/L — ABNORMAL LOW (ref 3.5–5.1)
Lab: 47 U/L — ABNORMAL HIGH (ref 5–34)
Lab: 5.4 g/dL — ABNORMAL LOW (ref 6.2–8.1)
Lab: 52 mL/Min/1.73 — AB (ref >59)
Lab: 7.8 mg/dL — ABNORMAL LOW (ref 8.4–10.2)
Lab: 133 MMOL/L — ABNORMAL LOW (ref 137–147)
Lab: 4.9 MMOL/L — ABNORMAL LOW (ref 3.5–5.1)

## 2020-02-25 LAB — CBC AND DIFF
Lab: 0.1 10*3/uL (ref 0.0–0.2)
Lab: 0.2 10*3/uL (ref 0.0–0.6)
Lab: 0.6 10*3/uL (ref 0.1–0.9)
Lab: 6.7 10*3/uL (ref 4.8–10.8)
Lab: 0 10*3/uL (ref 0–0.20)
Lab: 0.1 10*3/uL (ref 0–0.45)
Lab: 0.7 10*3/uL — ABNORMAL LOW (ref 1.0–4.8)
Lab: 0.8 10*3/uL — ABNORMAL HIGH (ref 0–0.80)
Lab: 1 % (ref >60)
Lab: 11 % (ref 4–12)
Lab: 11 % — ABNORMAL LOW (ref 24–44)
Lab: 18 % — ABNORMAL LOW (ref 36–45)
Lab: 2 % — ABNORMAL LOW (ref >60)
Lab: 2.4 M/UL — ABNORMAL LOW (ref 4.0–5.0)
Lab: 22 % — ABNORMAL HIGH (ref 11–15)
Lab: 24 pg — ABNORMAL LOW (ref 26–34)
Lab: 252 10*3/uL — ABNORMAL HIGH (ref 150–400)
Lab: 31 g/dL — ABNORMAL LOW (ref 32.0–36.0)
Lab: 5.4 10*3/uL (ref 1.8–7.0)
Lab: 5.9 g/dL — CL (ref 12.0–15.0)
Lab: 7.3 10*3/uL — ABNORMAL LOW (ref 4.5–11.0)
Lab: 75 % — ABNORMAL LOW (ref 41–77)
Lab: 77 FL — ABNORMAL LOW (ref 80–100)
Lab: 8.8 FL — ABNORMAL HIGH (ref 7–11)
Lab: 23 — ABNORMAL HIGH (ref <20.7)

## 2020-02-25 LAB — IRON + BINDING CAPACITY + %SAT+ FERRITIN
Lab: 12 ug/dL — ABNORMAL LOW (ref 50–160)
Lab: 21 ng/mL (ref 10–200)
Lab: 3 % — ABNORMAL LOW (ref 28–42)
Lab: 393 ug/dL — ABNORMAL HIGH (ref 270–380)

## 2020-02-25 LAB — CBC
Lab: 19 % — ABNORMAL LOW (ref 36–45)
Lab: 2.4 M/UL — ABNORMAL LOW (ref 4.0–5.0)
Lab: 22 % — ABNORMAL HIGH (ref 11–15)
Lab: 23 pg — ABNORMAL LOW (ref 26–34)
Lab: 242 10*3/uL (ref 150–400)
Lab: 31 g/dL — ABNORMAL LOW (ref 32.0–36.0)
Lab: 5.9 g/dL — CL (ref 12.0–15.0)
Lab: 6.7 10*3/uL (ref 4.5–11.0)
Lab: 76 FL — ABNORMAL LOW (ref 80–100)
Lab: 8.5 FL (ref 7–11)

## 2020-02-25 LAB — COVID-19 (SARS-COV-2) PCR

## 2020-02-25 LAB — PROTIME INR (PT)
Lab: 15 s — ABNORMAL HIGH (ref 9.9–12.6)
Lab: 1.4 — ABNORMAL HIGH (ref 0.8–1.2)

## 2020-02-25 LAB — POC GLUCOSE
Lab: 116 mg/dL — ABNORMAL HIGH (ref 70–100)
Lab: 100 mg/dL (ref 70–100)

## 2020-02-25 LAB — PTT (APTT): Lab: 32 s (ref 24.0–36.5)

## 2020-02-25 MED ORDER — DULOXETINE 60 MG PO CPDR
60 mg | Freq: Every day | ORAL | 0 refills | Status: DC
Start: 2020-02-25 — End: 2020-02-28
  Administered 2020-02-26 – 2020-02-28 (×3): 60 mg via ORAL

## 2020-02-25 MED ORDER — ZINC SULFATE 50 MG ZINC (220 MG) PO CAP
220 mg | Freq: Every day | ORAL | 0 refills | Status: DC
Start: 2020-02-25 — End: 2020-02-28
  Administered 2020-02-26 – 2020-02-28 (×3): 220 mg via ORAL

## 2020-02-25 MED ORDER — POLYETHYLENE GLYCOL 3350 17 GRAM PO PWPK
17 g | Freq: Two times a day (BID) | ORAL | 0 refills | Status: DC
Start: 2020-02-25 — End: 2020-02-28
  Administered 2020-02-26 – 2020-02-28 (×6): 17 g via ORAL

## 2020-02-25 MED ORDER — MELATONIN 3 MG PO TAB
3 mg | Freq: Every evening | ORAL | 0 refills | Status: DC | PRN
Start: 2020-02-25 — End: 2020-02-28

## 2020-02-25 MED ORDER — ONDANSETRON HCL (PF) 4 MG/2 ML IJ SOLN
4 mg | INTRAVENOUS | 0 refills | Status: DC | PRN
Start: 2020-02-25 — End: 2020-02-28

## 2020-02-25 MED ORDER — INSULIN ASPART 100 UNIT/ML SC FLEXPEN
0-6 [IU] | Freq: Before meals | SUBCUTANEOUS | 0 refills | Status: DC
Start: 2020-02-25 — End: 2020-02-28

## 2020-02-25 MED ORDER — CEFTRIAXONE INJ 2GM IVP
2 g | INTRAVENOUS | 0 refills | Status: DC
Start: 2020-02-25 — End: 2020-02-26

## 2020-02-25 MED ORDER — ATORVASTATIN 10 MG PO TAB
10 mg | Freq: Every day | ORAL | 0 refills | Status: DC
Start: 2020-02-25 — End: 2020-02-28
  Administered 2020-02-26 – 2020-02-28 (×3): 10 mg via ORAL

## 2020-02-25 MED ORDER — CEFTRIAXONE INJ 2GM IVP
2 g | Freq: Once | INTRAVENOUS | 0 refills | Status: CP
Start: 2020-02-25 — End: ?
  Administered 2020-02-25: 20:00:00 2 g via INTRAVENOUS

## 2020-02-25 MED ORDER — CETIRIZINE 10 MG PO TAB
10 mg | Freq: Every day | ORAL | 0 refills | Status: DC | PRN
Start: 2020-02-25 — End: 2020-02-28

## 2020-02-25 MED ORDER — ACETAMINOPHEN 500 MG PO TAB
500 mg | ORAL | 0 refills | Status: DC | PRN
Start: 2020-02-25 — End: 2020-02-28

## 2020-02-25 MED ORDER — RIFAXIMIN 550 MG PO TAB
550 mg | Freq: Two times a day (BID) | ORAL | 0 refills | Status: DC
Start: 2020-02-25 — End: 2020-02-28
  Administered 2020-02-26 – 2020-02-28 (×6): 550 mg via ORAL

## 2020-02-25 MED ORDER — ONDANSETRON 4 MG PO TBDI
4 mg | ORAL | 0 refills | Status: DC | PRN
Start: 2020-02-25 — End: 2020-02-28

## 2020-02-25 MED ORDER — OCTREOTIDE IV DRIP
50 ug/h | Freq: Once | INTRAVENOUS | 0 refills | Status: DC
Start: 2020-02-25 — End: 2020-02-25

## 2020-02-25 MED ORDER — OCTREOTIDE (SANDOSTATIN) BOLUS FOR CONTINUOUS INFUSION
50 ug | Freq: Once | INTRAVENOUS | 0 refills | Status: CP
Start: 2020-02-25 — End: ?

## 2020-02-25 MED ORDER — OCTREOTIDE IV DRIP
50 ug/h | INTRAVENOUS | 0 refills | Status: DC
Start: 2020-02-25 — End: 2020-02-26
  Administered 2020-02-25 – 2020-02-26 (×4): 50 ug/h via INTRAVENOUS

## 2020-02-25 MED ORDER — PANTOPRAZOLE 40 MG IV SOLR
80 mg | Freq: Once | INTRAVENOUS | 0 refills | Status: DC
Start: 2020-02-25 — End: 2020-02-25

## 2020-02-25 MED ORDER — PANTOPRAZOLE 40 MG IV SOLR
80 mg | Freq: Once | INTRAVENOUS | 0 refills | Status: CP
Start: 2020-02-25 — End: ?
  Administered 2020-02-25: 20:00:00 80 mg via INTRAVENOUS

## 2020-02-25 MED ORDER — PANTOPRAZOLE IV INFUSION
8 mg/h | INTRAVENOUS | 0 refills | Status: DC
Start: 2020-02-25 — End: 2020-02-26
  Administered 2020-02-26 (×4): 8 mg/h via INTRAVENOUS

## 2020-02-25 NOTE — Telephone Encounter
RN called to check on patient. She reports that they are pulling up to the hospital now, and she will be heading in to get labs.

## 2020-02-25 NOTE — H&P (View-Only)
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     Acute on chronic microcytic anemia   Acute blood loss anemia   Melena   Hx of GAVE/small varices   - symptoms chronic and recurrent   - EGD: 3/26 grade 1 varices not banded + portal HTN gastropath + moderate gave s/p argon plasma coagulation  - continued melena 2-3 bm/day since last egd 3/26 along with symptomatic anemia (DOE + orthostatics  - hb 5.8 but not far from baseline around 7   - vitals stable   - plan to keep patient NPO, repeat iron studies, hold diuretics, start IV protonix and IV octreotide drip, GI consulted, appreciate recs, trend cbc q8h, IV rocephin 2 gram empirically for SBP prophylaxis (hold her cipro)   - inr is <1.5 and stable, platelets wnl    Decompensated end-stage liver disease   -secondary to Nash, complicated by GAVE/portal hypertensive gastropathy, recurrent ascites, hepatic encephalopathy, coagulopathy  -She has recurrent ascites and has a regularly scheduled paracentesis with IR tomorrow but given GI bleed above will hold off on consulting till we make sure she is stable from a GI bleed standpoint   - On transplant list  MELD-Na score: 11 at 02/25/2020  1:00 PM  MELD score: 11 at 02/25/2020  1:00 PM  Calculated from:  Serum Creatinine: 0.99 MG/DL (Rounded to 1 MG/DL) at 02/21/4097  1:19 PM  Serum Sodium: 133 MMOL/L at 02/25/2020  1:00 PM Total Bilirubin: 1.2 MG/DL at 11/23/7827  5:62 PM  INR(ratio): 1.4 at 02/25/2020  1:00 PM  Age: 64 years 5 months  -Continue rifaximin, MiraLAX, zinc but hold lasix/cipro (on rocephin as above)     DM2   - SS1    HTN   - hold home BP meds (hold diuretic)     HLD   - continue statin    Full code  SCD   NPO   No IVF     Time spent with patient and on unit taking a history, examining patient, discussing any concerns, discussing plan of care, reviewing chart, labs and imaging. Complexity of medical decision making is high because of the multi-system nature of disease process.      Mardella Layman, M.D.   Hospitalist/Nocturnist  Pager #: [phone]  __________________________________________________________________________________  Primary Care Physician: Concha Norway  Verified    Chief Complaint:  melena  History of Present Illness: Kristin Moody is a 64 y.o. female PMH as above who presents with melena. Patient has chronic recurrent melena/anemia requiring blood and iron. She states that since her last egd on 3/26 she has been having continuous dark black tarry stool 2-3x/day cup  in amount that is worsening over the past few days. Associated with dyspnea on exertion and some dizziness when standing up. She has chronic diffuse abdominal and back pain d/t recurrent ascites, no acute worsening or recent changing. She has chronic unchanged nausea, denies any hematemesis or BRBPR. She denies any nsaid use. She denies any fever, chills, anorexia, cough, soa, chest pain, dysuria.     Medical History:   Diagnosis Date   ? Aneurysm (HCC)    ? 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 Diverticulosis of colon     descending and sigmoid colon   ? Dyspnea    ? Essential hypertension 08/13/2012   ? Fatty infiltration of liver    ? HTN (hypertension)    ? Obesity (BMI 30-39.9) 05/13/2011   ? Preop cardiovascular exam 01/07/2019   ? Sinus infection    ? Type 2 diabetes mellitus (HCC) 09/21/2014     Surgical History:   Procedure Laterality Date   ? HYSTERECTOMY  1994   ? UPPER GASTROINTESTINAL ENDOSCOPY  2009   ? COLONOSCOPY  2009   ? CYSTOCELE REPAIR  2009    with endocele repair   ? RECTOCELE REPAIR  03/2009   ? LIVER BIOPSY  07/17/2010   ? COLONOSCOPY N/A 11/08/2017    Performed by Dawna Part, MD at [facility] ENDO   ? ESOPHAGOGASTRODUODENOSCOPY N/A 11/08/2017    Performed by Dawna Part, MD at [facility] ENDO   ? ESOPHAGOGASTRODUODENOSCOPY WITH BIOPSY - FLEXIBLE  11/08/2017    Performed by Dawna Part, MD at [facility] ENDO   ? COLONOSCOPY WITH HOT BIOPSY FORCEPS REMOVAL TUMOR/ POLYP/ OTHER LESION  11/08/2017    Performed by Dawna Part, MD at [facility] ENDO   ? ESOPHAGOGASTRODUODENOSCOPY WITH BAND LIGATION ESOPHAGEAL/ GASTRIC VARICES - FLEXIBLE N/A 04/10/2018    Performed by Normajean Baxter, MD at [facility] ENDO   ? ESOPHAGOGASTRODUODENOSCOPY WITH BIOPSY - FLEXIBLE N/A 04/10/2018    Performed by Normajean Baxter, MD at [facility] ENDO   ? ESOPHAGOGASTRODUODENOSCOPY WITH CONTROL OF BLEEDING - FLEXIBLE N/A 04/25/2018    Performed by Jolee Ewing, MD at [facility] ENDO   ? ESOPHAGOGASTRODUODENOSCOPY WITH BIOPSY - FLEXIBLE with push enteroscopy N/A 08/28/2018    Performed by Celesta Gentile, MD at [facility] ENDO   ? EGD N/A 10/27/2018    Performed by Eliott Nine, MD at [facility] ENDO   ? ESOPHAGOGASTRODUODENOSCOPY WITH SNARE REMOVAL TUMOR/ POLYP/ OTHER LESION - FLEXIBLE N/A 10/27/2018    Performed by Eliott Nine, MD at [facility] ENDO   ? ESOPHAGOGASTRODUODENOSCOPY WITH BIOPSY - FLEXIBLE N/A 12/16/2018    Performed by Buckles, Vinnie Level, MD at [facility] ENDO   ? ESOPHAGOGASTRODUODENOSCOPY WITH CONTROL OF BLEEDING - FLEXIBLE N/A 12/16/2018    Performed by Buckles, Vinnie Level, MD at [facility] ENDO   ? ESOPHAGOGASTRODUODENOSCOPY WITH SPECIMEN COLLECTION BY BRUSHING/ WASHING N/A 01/22/2019    Performed by Veneta Penton, MD at [facility] ENDO   ? ESOPHAGOGASTRODUODENOSCOPY WITH DILATION ESOPHAGUS WITH BALLOON 30 MM OR GREATER - FLEXIBLE N/A 01/22/2019    Performed by Veneta Penton, MD at [facility] ENDO   ? ESOPHAGOGASTRODUODENOSCOPY WITH BIOPSY - FLEXIBLE N/A 01/22/2019    Performed by Veneta Penton, MD at [facility] ENDO   ? ESOPHAGOGASTRODUODENOSCOPY WITH BIOPSY - FLEXIBLE N/A 06/19/2019    Performed by Dawna Part, MD at [facility] ENDO   ? ESOPHAGOGASTRODUODENOSCOPY [WITH APC]  WITH SPECIMEN COLLECTION BY BRUSHING/ WASHING N/A 08/07/2019  Performed by Dawna Part, MD at [facility] ENDO   ? ESOPHAGOGASTRODUODENOSCOPY WITH SPECIMEN COLLECTION BY BRUSHING/ WASHING N/A 09/10/2019    Performed by Buckles, Vinnie Level, MD at [facility] ENDO   ? ESOPHAGOGASTRODUODENOSCOPY WITH CONTROL OF BLEEDING - FLEXIBLE N/A 09/10/2019    Performed by Buckles, Vinnie Level, MD at [facility] ENDO   ? ESOPHAGOGASTRODUODENOSCOPY WITH CONTROL OF BLEEDING - FLEXIBLE N/A 02/12/2020    Performed by Lenor Derrick, MD at [facility] ENDO   ? CHOLECYSTECTOMY     ? COLONOSCOPY     ? ESOPHAGOGASTRIC FUNDOPLICATION  2003, 2004    laparoscopic   ? ESOPHAGOGASTRIC FUNDOPLICATION     ? HX HYSTERECTOMY     ? PR ESOPHAGOSCOPY FLEXIBLE TRANSORAL DIAGNOSTIC       Family history reviewed; non-contributory  Social History     Socioeconomic History   ? Marital status: Married     Spouse name: [name]   ? Number of children: 1   ? Years of education: Not on file   ? Highest education level: Not on file   Occupational History   ? Occupation: Geographical information systems officer: EMPLOYED   Social Needs   ? Financial resource strain: Not on file   ? Food insecurity     Worry: Not on file     Inability: Not on file   ? Transportation needs     Medical: Not on file     Non-medical: Not on file   Tobacco Use   ? Smoking status: Never Smoker   ? Smokeless tobacco: Never Used   Substance and Sexual Activity   ? Alcohol use: No   ? Drug use: No   ? Sexual activity: Yes     Partners: Male     Birth control/protection: Other   Lifestyle   ? Physical activity     Days per week: Not on file     Minutes per session: Not on file   ? Stress: Not on file   Relationships   ? Social Wellsite geologist on phone: Not on file     Gets together: Not on file     Attends religious service: Not on file     Active member of club or organization: Not on file     Attends meetings of clubs or organizations: Not on file     Relationship status: Not on file   ? Intimate partner violence     Fear of current or ex partner: Not on file     Emotionally abused: Not on file     Physically abused: Not on file     Forced sexual activity: Not on file   Other Topics Concern   ? Not on file   Social History Narrative    Negative  For tobacco, alcohol, or illicit drug use.  No high risk sexual behavior or tattoo placement and no transfusion prior to 1992.  Married, one child healthy.  Works in Radio broadcast assistant.    Works in [employer]       Vaping/E-liquid Use   ? Vaping Use Never User      Vaping/E-liquid Substances   ? CBD No    ? Nicotine No    ? Other No    ? THC No    ? Unknown No        Immunizations (includes history and patient reported):   Immunization History   Administered Date(s) Administered   ? Flu Vaccine =>  6 Months Quadrivalent PF 08/27/2017, 08/13/2019   ? Hepatitis A vaccine Adult IM 08/13/2013           Allergies:  Tegaderm, Adhesive tape (rosins), Sulfa (sulfonamide antibiotics), Codeine, Morphine, Penicillin g, and Tramadol    Medications:  (Not in a hospital admission)    Review of Systems:  Constitutional: negative  Eyes: negative  Ears, nose, mouth, throat, and face: negative  Respiratory: negative  Cardiovascular: negative  Gastrointestinal: melena  Genitourinary:negative  Integument/breast: negative  Hematologic/lymphatic: negative  Musculoskeletal:negative  Neurological: negative  Behavioral/Psych: negative  Endocrine: negative    Physical Exam:  Vital Signs: Last Filed In 24 Hours Vital Signs: 24 Hour Range   BP: 105/50 (04/08 1531)  Temp: 36.7 ?C (98.1 ?F) (04/08 1531)  Pulse: 95 (04/08 1531)  Respirations: 17 PER MINUTE (04/08 1531)  SpO2: 98 % (04/08 1531)  SpO2 Pulse: 95 (04/08 1531)  Height: 161.3 cm (63.5) (04/08 1241) BP: (99-116)/(37-67) Temp:  [36.7 ?C (98.1 ?F)]   Pulse:  [89-99]   Respirations:  [12 PER MINUTE-18 PER MINUTE]   SpO2:  [98 %-100 %]           General:  Alert, cooperative, no distress, appears stated age  Head:  Normocephalic, without obvious abnormality, atraumatic  Eyes:  EOMs intact.    Neck:  Supple  Lungs:  Clear to auscultation bilaterally  Chest wall:  No tenderness or deformity.  Heart:    Regular rate and rhythm, S1, S2 normal, no murmur, click rub or gallop  Abdomen:  Distended w significant ascites, diffuse TTP, no guarding/rigidity, soft, + BS   Extremities: +2 LL edema  Peripheral pulses:   2+ and symmetric, all extremities  Cap Refill:  <2 sec  Skin:   Skin color, texture, turgor normal.  No rashes or lesions  Neurologic:  CNII - XII intact.  Normal strength.  Musculoskeletal:  Normal / Negative  Psych:  Normal    Lab/Radiology/Other Diagnostic Tests:  24-hour labs:    Results for orders placed or performed during the hospital encounter of 02/25/20 (from the past 24 hour(s))   PROTIME INR (PT)    Collection Time: 02/25/20  1:00 PM   Result Value Ref Range    INR 1.4 (H) 0.8 - 1.2   PTT (APTT)    Collection Time: 02/25/20  1:00 PM   Result Value Ref Range    APTT 32.8 24.0 - 36.5 SEC   COVID-19 (SARS-COV-2) PCR    Collection Time: 02/25/20  1:00 PM    Specimen: Nasopharyngeal; Flocked Swab   Result Value Ref Range    COVID-19 (SARS-CoV-2) PCR Source FLOCKED SWAB  NASOPHARYNGEAL       COVID-19 (SARS-CoV-2) PCR NOT DETECTED DN-NOT DETECTED   COMPREHENSIVE METABOLIC PANEL    Collection Time: 02/25/20  1:00 PM   Result Value Ref Range    Sodium 133 (L) 137 - 147 MMOL/L    Potassium 4.9 3.5 - 5.1 MMOL/L    Chloride 107 98 - 110 MMOL/L    Glucose 108 (H) 70 - 100 MG/DL    Blood Urea Nitrogen 30 (H) 7 - 25 MG/DL    Creatinine 1.61 0.4 - 1.00 MG/DL    Calcium 8.5 8.5 - 09.6 MG/DL    Total Protein 5.8 (L) 6.0 - 8.0 G/DL    Total Bilirubin 1.2 0.3 - 1.2 MG/DL    Albumin 3.1 (L) 3.5 - 5.0 G/DL    Alk Phosphatase 045 (H) 25 - 110 U/L  AST (SGOT) 43 (H) 7 - 40 U/L    CO2 18 (L) 21 - 30 MMOL/L    ALT (SGPT) 22 7 - 56 U/L    Anion Gap 8 3 - 12    eGFR Non African American 57 (L) >60 mL/min    eGFR African American >60 >60 mL/min   CBC AND DIFF    Collection Time: 02/25/20  1:00 PM   Result Value Ref Range    White Blood Cells 7.3 4.5 - 11.0 K/UL    RBC 2.44 (L) 4.0 - 5.0 M/UL    Hemoglobin 5.9 (LL) 12.0 - 15.0 GM/DL    Hematocrit 16.1 (L) 36 - 45 %    MCV 77.1 (L) 80 - 100 FL    MCH 24.1 (L) 26 - 34 PG    MCHC 31.3 (L) 32.0 - 36.0 G/DL    RDW 09.6 (H) 11 - 15 %    Platelet Count 252 150 - 400 K/UL    MPV 8.8 7 - 11 FL    Neutrophils 75 41 - 77 %    Lymphocytes 11 (L) 24 - 44 %    Monocytes 11 4 - 12 %    Eosinophils 2 0 - 5 %    Basophils 1 0 - 2 %    Absolute Neutrophil Count 5.47 1.8 - 7.0 K/UL    Absolute Lymph Count 0.77 (L) 1.0 - 4.8 K/UL    Absolute Monocyte Count 0.81 (H) 0 - 0.80 K/UL    Absolute Eosinophil Count 0.15 0 - 0.45 K/UL    Absolute Basophil Count 0.06 0 - 0.20 K/UL    MDW (Monocyte Distribution Width) 23.6 (H) <20.7   POC GLUCOSE    Collection Time: 02/25/20  2:30 PM   Result Value Ref Range    Glucose, POC 116 (H) 70 - 100 MG/DL     Glucose: (!) 045 (40/98/11 1300)  POC Glucose (Download): (!) 116 (02/25/20 1430)  Pertinent radiology reviewed.    Mardella Layman, MD

## 2020-02-25 NOTE — Consults
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 female with history of decompensated NASH cirrhosis, with refractory ascites, hepatic encephalopathy, with known esophageal varices, portal hypertensive gastropathy, GAVE, who presents with black stools and low hemoglobin.    Kristin Moody was admitted from 3/24-3/26 with acute on chronic anemia without overt GI bleeding. She had EGD on 3/26 which revealed small, nonbleeding esophageal varices, portal hypertensive gastropathy, and moderate GAVE. Her GAVE was treated with APC and she discharged the same day with hgb of 7.3. She was planned for continued lab checks and PRN transfusions.    Hgb was 8.5 on 3/30, but was found to be 6.1 on 4/6 from outside labs in [place]. She was called and directed to come to the ER for further evaluation. She reports that since her discharge, she has noticed intermittent black, sticky stools without any dark/fresh blood. She does get intermittent nosebleeds. She denies any nausea or vomiting, including hematemesis/coffee ground emesis. She denies any NSAID use. She is endorsing abdominal discomfort, which she attributes to needing a paracentesis. She usually gets weekly paracenteses and last had 5.8 L removed on 4/2.    In the ER, her repeat hemoglobin was 5.9. BP was 100/50s with HR in 90s.      Assessment/ Plan:  Kristin Moody is a 64 y.o. female with history of decompensated NASH cirrhosis, with refractory ascites, hepatic encephalopathy, with known esophageal varices, portal hypertensive gastropathy, GAVE, who presents with black stools and low hemoglobin.    Decompensated NASH cirrhosis:   -MELD-Na score:   MELD-Na score: 11 at 02/25/2020  1:00 PM  MELD score: 11 at 02/25/2020  1:00 PM  Calculated from: Serum Creatinine: 0.99 MG/DL (Rounded to 1 MG/DL) at 02/23/8294  6:21 PM  Serum Sodium: 133 MMOL/L at 02/25/2020  1:00 PM  Total Bilirubin: 1.2 MG/DL at 3/0/8657  8:46 PM  INR(ratio): 1.4 at 02/25/2020  1:00 PM  Age: 39 years 5 months  Transplant candidacy: currently listed    Acute on chronic iron deficiency anemia  Upper GI bleed with melena  GAVE, portal hypertensive gastropathy, small EV  - patient with evidence of recurrent upper GI bleeding with melena and Hgb drop from 8.5 to 5.9 in last 1-2 weeks  - rectal exam with reported black stools per patient/ER, but she declines repeat  - she is currently hemodynamically stable, albeit in MAPS in 60s possibly secondary to cirrhosis. BUN/Cr is elevated > 30  - last EGD was 02/12/20: with GAVE s/p APC, PHG, and small nonbleeding EV  - lower suspicion for variceal bleed, mostly likely ongoing chroinc blood loss from GAVE/PHG    Hepatic encephalopathy:   - No acute issues at this time  - on PTA miralax and rifaximin    Refractory ascites  - getting frequent paracenteses, last one on 02/19/20 with fluid studies negative for SBP  - she is on ciprofloxacin prophylaxis as outpatient    HCC screening:   -Last imaging 11/2019: US-1, negative. Visualization   score: B.   -Last AFP 2.8 on 12/2019    Renal status:  - Creatinine within normal limits, 0.99  - She did have mild AKI during last admission, with  peak Cr of 1.67    Colorectal cancer screening  - last colonoscopy in 10/2017 with 6 tubular adenomas  - will be due in 10/2020    Recommendations:  - ok for clear liquid diet tonight and then NPO at MN  - plan for EGD on 4/9 for further evaluation  - start IV PPI drip, octreotide drip (although suspicion for variceal bleeding is lower)  - antibiotics for SBP ppx in upper GI bleed with cirrhosis  - please consult IR for diagnostic/therapeutic paracentesis, send fluid studies for cell count and culture  - if she remains stable in the morning, ideally would have paracentesis prior to endoscopy  - trend CBC and transfuse for Hgb > 7  - if patient is showing signs of ongoing significant bleeding (hemodynamic instability, dropping hemoglobin despite transfusions), please page on call GI fellow for evaluation for urgent endoscopy overnight    Patient seen/discussed with Dr. Joycie Moody  GI fellow   Pager [phone]  02/25/2020 4:07 PM       -----------------------------  PMH:  Medical History:   Diagnosis Date   ? Aneurysm (HCC)    ? 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 Diverticulosis of colon     descending and sigmoid colon   ? Dyspnea    ? Essential hypertension 08/13/2012   ? Fatty infiltration of liver    ? HTN (hypertension)    ? Obesity (BMI 30-39.9) 05/13/2011   ? Preop cardiovascular exam 01/07/2019   ? Sinus infection    ? Type 2 diabetes mellitus (HCC) 09/21/2014       Current medications:  No current facility-administered medications on file prior to encounter.      Current Outpatient Medications on File Prior to Encounter   Medication Sig Dispense Refill   ? atorvastatin (LIPITOR) 10 mg tablet TAKE 1 TABLET BY MOUTH EVERY DAY 90 tablet 3   ? cetirizine (ZYRTEC) 10 mg tablet Take 10 mg by mouth daily as needed for Allergy symptoms.     ? ciprofloxacin (CIPRO) 500 mg tablet Take one tablet by mouth daily. 90 tablet 3   ? duloxetine DR (CYMBALTA) 60 mg capsule Take 60 mg by mouth daily.     ? furosemide (LASIX) 20 mg tablet Take two tablets by mouth every morning. (Patient taking differently: Take 80 mg by mouth every morning.) 270 tablet 1   ? pantoprazole DR (PROTONIX) 40 mg tablet Take one tablet by mouth twice daily. 60 tablet 3   ? polyethylene glycol 3350 (MIRALAX) 17 g packet Take one packet by mouth twice daily.     ? potassium chloride (K-TAB) 20 mEq tablet Take one tablet by mouth daily. Take with a meal and a full glass of water. 90 tablet 0   ? promethazine (PHENERGAN) 25 mg tablet Take 25 mg by mouth every 4 hours as needed. Just uses it with Tramadol     ? rifAXIMin (XIFAXAN) 550 mg tablet Take one tablet by mouth twice daily. 180 tablet 3   ? zinc sulfate 220 mg (50 mg elemental zinc) capsule Take one capsule by mouth daily. 90 capsule 3       PSH:  Surgical History:   Procedure Laterality Date   ? HYSTERECTOMY  1994   ? UPPER GASTROINTESTINAL ENDOSCOPY  2009   ? COLONOSCOPY  2009   ? CYSTOCELE REPAIR  2009    with endocele repair   ? RECTOCELE  REPAIR  03/2009   ? LIVER BIOPSY  07/17/2010   ? COLONOSCOPY N/A 11/08/2017    Performed by Dawna Part, MD at [facility] ENDO   ? ESOPHAGOGASTRODUODENOSCOPY N/A 11/08/2017    Performed by Dawna Part, MD at [facility] ENDO   ? ESOPHAGOGASTRODUODENOSCOPY WITH BIOPSY - FLEXIBLE  11/08/2017    Performed by Dawna Part, MD at [facility] ENDO   ? COLONOSCOPY WITH HOT BIOPSY FORCEPS REMOVAL TUMOR/ POLYP/ OTHER LESION  11/08/2017    Performed by Dawna Part, MD at [facility] ENDO   ? ESOPHAGOGASTRODUODENOSCOPY WITH BAND LIGATION ESOPHAGEAL/ GASTRIC VARICES - FLEXIBLE N/A 04/10/2018    Performed by Normajean Baxter, MD at [facility] ENDO   ? ESOPHAGOGASTRODUODENOSCOPY WITH BIOPSY - FLEXIBLE N/A 04/10/2018    Performed by Normajean Baxter, MD at [facility] ENDO   ? ESOPHAGOGASTRODUODENOSCOPY WITH CONTROL OF BLEEDING - FLEXIBLE N/A 04/25/2018    Performed by Jolee Ewing, MD at [facility] ENDO   ? ESOPHAGOGASTRODUODENOSCOPY WITH BIOPSY - FLEXIBLE with push enteroscopy N/A 08/28/2018    Performed by Celesta Gentile, MD at [facility] ENDO   ? EGD N/A 10/27/2018    Performed by Eliott Nine, MD at [facility] ENDO   ? ESOPHAGOGASTRODUODENOSCOPY WITH SNARE REMOVAL TUMOR/ POLYP/ OTHER LESION - FLEXIBLE N/A 10/27/2018    Performed by Eliott Nine, MD at [facility] ENDO   ? ESOPHAGOGASTRODUODENOSCOPY WITH BIOPSY - FLEXIBLE N/A 12/16/2018    Performed by Buckles, Vinnie Level, MD at [facility] ENDO   ? ESOPHAGOGASTRODUODENOSCOPY WITH CONTROL OF BLEEDING - FLEXIBLE N/A 12/16/2018    Performed by Buckles, Vinnie Level, MD at [facility] ENDO   ? ESOPHAGOGASTRODUODENOSCOPY WITH SPECIMEN COLLECTION BY BRUSHING/ WASHING N/A 01/22/2019    Performed by Veneta Penton, MD at [facility] ENDO   ? ESOPHAGOGASTRODUODENOSCOPY WITH DILATION ESOPHAGUS WITH BALLOON 30 MM OR GREATER - FLEXIBLE N/A 01/22/2019    Performed by Veneta Penton, MD at [facility] ENDO   ? ESOPHAGOGASTRODUODENOSCOPY WITH BIOPSY - FLEXIBLE N/A 01/22/2019    Performed by Veneta Penton, MD at [facility] ENDO   ? ESOPHAGOGASTRODUODENOSCOPY WITH BIOPSY - FLEXIBLE N/A 06/19/2019    Performed by Dawna Part, MD at [facility] ENDO   ? ESOPHAGOGASTRODUODENOSCOPY [WITH APC]  WITH SPECIMEN COLLECTION BY BRUSHING/ WASHING N/A 08/07/2019    Performed by Dawna Part, MD at [facility] ENDO   ? ESOPHAGOGASTRODUODENOSCOPY WITH SPECIMEN COLLECTION BY BRUSHING/ WASHING N/A 09/10/2019    Performed by Buckles, Vinnie Level, MD at [facility] ENDO   ? ESOPHAGOGASTRODUODENOSCOPY WITH CONTROL OF BLEEDING - FLEXIBLE N/A 09/10/2019    Performed by Buckles, Vinnie Level, MD at [facility] ENDO   ? ESOPHAGOGASTRODUODENOSCOPY WITH CONTROL OF BLEEDING - FLEXIBLE N/A 02/12/2020    Performed by Lenor Derrick, MD at [facility] ENDO   ? CHOLECYSTECTOMY     ? COLONOSCOPY     ? ESOPHAGOGASTRIC FUNDOPLICATION  2003, 2004    laparoscopic   ? ESOPHAGOGASTRIC FUNDOPLICATION     ? HX HYSTERECTOMY     ? PR ESOPHAGOSCOPY FLEXIBLE TRANSORAL DIAGNOSTIC         SH:  Social History     Socioeconomic History   ? Marital status: Married     Spouse name: [name]   ? Number of children: 1   ? Years of education: Not on file   ? Highest education level: Not on file   Occupational History   ? Occupation: Geographical information systems officer: EMPLOYED   Tobacco Use   ? Smoking status: Never  Smoker   ? Smokeless tobacco: Never Used   Substance and Sexual Activity   ? Alcohol use: No   ? Drug use: No   ? Sexual activity: Yes     Partners: Male     Birth control/protection: Other   Other Topics Concern   ? Not on file   Social History Narrative    Negative  For tobacco, alcohol, or illicit drug use.  No high risk sexual behavior or tattoo placement and no transfusion prior to 1992.  Married, one child healthy.  Works in Radio broadcast assistant.    Works in [place]        FH:  Family History   Problem Relation Age of Onset   ? Other Mother    ? Kidney Failure Mother    ? Osteoporosis Mother    ? Cancer Father         esophageal   ? Liver Disease Sister         endstage, s/p liver transplant   ? Cancer Brother         tonsil, liver    ? Hip Fracture Neg Hx        Review of Systems:  Constitutional: No fevers, chills, weight loss  Eyes: No vision deficit, no icterus  Ears, nose, mouth: No oral bleeding, ulcer  Cardiovascular: No chest pain, palpitations  Respiratory: No dyspnea, cough   Gastrointestinal: No abdominal pain, blood in stool  Musculoskeltal: No joint inflammation, new deformity  Integumentary: No rashes, exudate  Neurologic: No cognitive change, focal weakness  Hematologic: No for easy bleeding or bruising  Please see HPI for additional pertinent documentation    Physical Exam:  Vitals:    02/25/20 1500 02/25/20 1530 02/25/20 1531 02/25/20 1600   BP: 111/63 105/50 105/50 103/61   BP Source:       Pulse: 94 95 95 91   Temp:   36.7 ?C (98.1 ?F) 36.7 ?C (98.1 ?F)   SpO2: 98%  98% 98%   Height:         Constitutional- Vitals above, no acute distress.   Head - Normocephalic, atraumatic.   Eyes -  EOMI grossly. No icterus or injection.   Ears, nose, mouth, throat- No oral ulcer or bleeding.   Neck - No swelling or tracheal deviation.   Respiratory - Symmetric chest rise, no increased work of breathing.  Cardiovascular - Peripheral pulses intact, no pedal edema.  Gastrointestinal- moderate distention, mild discomfort No hepatospenomegaly.   Skin - No exposed rash, lesion.  Neurologic - No CN deficit, normal fluid speech  Psychiatric - Judgement intact, thought content appropriate.    Labs/Imaging:  Pertient labs/imaging were reviewed on initiation of progress note.

## 2020-02-25 NOTE — Telephone Encounter
RN called the patient, advised her that her hemoglobin from [place] was 6.1 yesterday. Patient denied dizziness. Instructed her to come to the hospital to have labs done. Depending on results, will arrange transfusion or send to the ER.  She verbalized understanding.

## 2020-02-25 NOTE — Progress Notes
Continuum of Care     Multiple Visit Patient - Case Management Note      NAME: SOFIE SCHENDEL MRN: [id number] DOB: Aug 13, 1956  AGE: 64 y.o.    Admits in past 12 mos: ED Visits: 0 Admissions: 4     Source of Information: Patient, EMR     Primary MVP Case Manager: Arlyss Gandy     Reporting Out: [facility] met with Pt and her husband, [name], at bedside. Coordinated with ED team, MVP SWCM, and Tara, transplant coordinator.     Driver of Utilization: Chronic Recurrent Symptoms     Disposition: Admit ? Inpatient     Interventions   ? Met with Pt and her husband, [name]. Followed up on assessment from last presentation on 02/10/20. Pt reports that she continues to be ?doing okay? and was directed in by clinic with low hgb, unable to receive transfusion today for this reason. Inquired whether Pt had any success in trying Taurine medication for her hand and foot cramping since last admission. She reports that she ordered it and it was supposed to be delivered to her home today. Pt states that she continues to engage with transplant clinic regularly and is ?just waiting on the right liver?Marland Kitchen SWCM inquired about Pt?s mental health while managing frequent appts, travel from [place] and not feeling well. She reports she is doing ?okay? and has a supportive family. Pt expresses that she understands that she can ask for this type of support if needed in the future.   ? SWCM contacted transplant coordinator, [name], for follow up and assessment of barriers they may observe Pt to have in managing health at home. [name] reports that Pt is in frequent communication with their clinic and has weekly blood draws, twice weekly if needed. At this time, Pt gets blood drawn in [place] and there can be a day delay in receiving these results. One option would be for Pt to have this done at [place] [facility] vs. in [place] to avoid delay. Pt is encouraged by transplant team to communicate when she is symptomatic/ feeling ill vs waiting to hear from them to report concerns. [name] reports that Dr. Ladona Ridgel is aware of Pt?s multiple admissions and will schedule HFU upon discharge.   ? Coordinated with ED team on Pt presentation, MVP status and next steps   ? Coordinated with primary MVP SWCM on Pt presentation, possible DOU    Next Steps  ? Further assessment of Pt interest in changing location for weekly labs in effort to avoid delay in receiving results; coordinate with [place] [facility]/ transplant team if agreeable   ? Continued coordinated with OP providers upon ED presentation / IP admission   ? Ongoing assessment of Pt mental health and coping with exhaustion of health management and awaiting transplant       Wendall Stade Phone: [phone] or Amie Critchley

## 2020-02-25 NOTE — Telephone Encounter
1100  RN notified that the patient's labs have been drawn, she is planning to wait in the CFT waiting room until she receives a call with instructions    1155  RN called the lab, patient's hemoglobin was 5.9  Outpatient transfusion center is unable to complete outpatient transfusion for a hemoglobin less than 6.    1200   RN called the patient, instructed her to go to the ER. Patient verbalized understanding.

## 2020-02-25 NOTE — ED Provider Notes
Kristin Moody is a 64 y.o. female.    Chief Complaint:  Chief Complaint   Patient presents with   ? Anemia       History of Present Illness:  Kristin Moody is a 64 y.o. female, with a history of ESLD who presents to the emergency department for anemia. Patient was seen in [place] on 02/19/20 for EGD resulting multiple varices. Patient reports dark tarry stool since the EGD. Clinic from [place] reported patient with hemoglobin 6.1 yesterday; repeat here in lab today hemoglobin 5.9. Patient is scheduled for routine paracentesis tomorrow. Last paracentesis was last week  5.8liters; routinely weekly ~6L. Patient does report fatigue and light headedness starting this morning. Denies any acute shortness of breath, chest pain.  She additionally denies any fever, chills, dysuria, hematuria, diarrhea, constipation or new abdominal pain.  Patient reports that her abdomen feels tight which is typical prior to her therapeutic paracentesis.      History provided by:  Patient and medical records  Language interpreter used: No        Review of Systems:  Review of Systems   Constitutional: Positive for fatigue. Negative for activity change, chills and fever.   HENT: Negative for congestion and sore throat.    Eyes: Negative for visual disturbance.   Respiratory: Negative for cough and shortness of breath.    Cardiovascular: Positive for leg swelling. Negative for chest pain.   Gastrointestinal: Positive for abdominal distention. Negative for abdominal pain, diarrhea, nausea and vomiting.   Genitourinary: Negative for difficulty urinating and dysuria.   Musculoskeletal: Negative for back pain and myalgias.   Skin: Negative for rash.   Neurological: Positive for light-headedness. Negative for headaches.   Psychiatric/Behavioral: Negative for confusion.       Allergies:  Tegaderm, Adhesive tape (rosins), Sulfa (sulfonamide antibiotics), Codeine, Morphine, Penicillin g, and Tramadol    Past Medical History:  Medical History:   Diagnosis Date   ? Aneurysm (HCC)    ? 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 Diverticulosis of colon     descending and sigmoid colon   ? Dyspnea    ? Essential hypertension 08/13/2012   ? Fatty infiltration of liver    ? HTN (hypertension)    ? Obesity (BMI 30-39.9) 05/13/2011   ? Preop cardiovascular exam 01/07/2019   ? Sinus infection    ? Type 2 diabetes mellitus (HCC) 09/21/2014       Past Surgical History:  Surgical History:   Procedure Laterality Date   ? HYSTERECTOMY  1994   ? UPPER GASTROINTESTINAL ENDOSCOPY  2009   ? COLONOSCOPY  2009   ? CYSTOCELE REPAIR  2009    with endocele repair   ? RECTOCELE REPAIR  03/2009   ? LIVER BIOPSY  07/17/2010   ? COLONOSCOPY N/A 11/08/2017    Performed by Dawna Part, MD at [facility] ENDO   ? ESOPHAGOGASTRODUODENOSCOPY N/A 11/08/2017    Performed by Dawna Part, MD at [facility] ENDO   ? ESOPHAGOGASTRODUODENOSCOPY WITH BIOPSY - FLEXIBLE  11/08/2017    Performed by Dawna Part, MD at [facility] ENDO   ? COLONOSCOPY WITH HOT BIOPSY FORCEPS REMOVAL TUMOR/ POLYP/ OTHER LESION  11/08/2017    Performed by Dawna Part, MD at [facility] ENDO   ? ESOPHAGOGASTRODUODENOSCOPY WITH BAND LIGATION ESOPHAGEAL/ GASTRIC VARICES - FLEXIBLE N/A 04/10/2018    Performed by Normajean Baxter, MD at [facility] ENDO   ? ESOPHAGOGASTRODUODENOSCOPY WITH BIOPSY -  FLEXIBLE N/A 04/10/2018    Performed by Normajean Baxter, MD at [facility] ENDO   ? ESOPHAGOGASTRODUODENOSCOPY WITH CONTROL OF BLEEDING - FLEXIBLE N/A 04/25/2018    Performed by Jolee Ewing, MD at [facility] ENDO   ? ESOPHAGOGASTRODUODENOSCOPY WITH BIOPSY - FLEXIBLE with push enteroscopy N/A 08/28/2018    Performed by Celesta Gentile, MD at [facility] ENDO   ? EGD N/A 10/27/2018    Performed by Eliott Nine, MD at [facility] ENDO   ? ESOPHAGOGASTRODUODENOSCOPY WITH SNARE REMOVAL TUMOR/ POLYP/ OTHER LESION - FLEXIBLE N/A 10/27/2018    Performed by Eliott Nine, MD at [facility] ENDO   ? ESOPHAGOGASTRODUODENOSCOPY WITH BIOPSY - FLEXIBLE N/A 12/16/2018    Performed by Buckles, Vinnie Level, MD at [facility] ENDO   ? ESOPHAGOGASTRODUODENOSCOPY WITH CONTROL OF BLEEDING - FLEXIBLE N/A 12/16/2018    Performed by Buckles, Vinnie Level, MD at [facility] ENDO   ? ESOPHAGOGASTRODUODENOSCOPY WITH SPECIMEN COLLECTION BY BRUSHING/ WASHING N/A 01/22/2019    Performed by Veneta Penton, MD at [facility] ENDO   ? ESOPHAGOGASTRODUODENOSCOPY WITH DILATION ESOPHAGUS WITH BALLOON 30 MM OR GREATER - FLEXIBLE N/A 01/22/2019    Performed by Veneta Penton, MD at [facility] ENDO   ? ESOPHAGOGASTRODUODENOSCOPY WITH BIOPSY - FLEXIBLE N/A 01/22/2019    Performed by Veneta Penton, MD at [facility] ENDO   ? ESOPHAGOGASTRODUODENOSCOPY WITH BIOPSY - FLEXIBLE N/A 06/19/2019    Performed by Dawna Part, MD at [facility] ENDO   ? ESOPHAGOGASTRODUODENOSCOPY [WITH APC]  WITH SPECIMEN COLLECTION BY BRUSHING/ WASHING N/A 08/07/2019    Performed by Dawna Part, MD at [facility] ENDO   ? ESOPHAGOGASTRODUODENOSCOPY WITH SPECIMEN COLLECTION BY BRUSHING/ WASHING N/A 09/10/2019    Performed by Buckles, Vinnie Level, MD at [facility] ENDO   ? ESOPHAGOGASTRODUODENOSCOPY WITH CONTROL OF BLEEDING - FLEXIBLE N/A 09/10/2019    Performed by Buckles, Vinnie Level, MD at [facility] ENDO   ? ESOPHAGOGASTRODUODENOSCOPY WITH CONTROL OF BLEEDING - FLEXIBLE N/A 02/12/2020    Performed by Lenor Derrick, MD at [facility] ENDO   ? CHOLECYSTECTOMY     ? COLONOSCOPY     ? ESOPHAGOGASTRIC FUNDOPLICATION  2003, 2004    laparoscopic   ? ESOPHAGOGASTRIC FUNDOPLICATION     ? HX HYSTERECTOMY     ? PR ESOPHAGOSCOPY FLEXIBLE TRANSORAL DIAGNOSTIC         Pertinent medical/surgical history reviewed  Medical History:   Diagnosis Date   ? Aneurysm (HCC)    ? 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 Diverticulosis of colon     descending and sigmoid colon   ? Dyspnea    ? Essential hypertension 08/13/2012   ? Fatty infiltration of liver    ? HTN (hypertension)    ? Obesity (BMI 30-39.9) 05/13/2011   ? Preop cardiovascular exam 01/07/2019   ? Sinus infection    ? Type 2 diabetes mellitus (HCC) 09/21/2014     Surgical History: Procedure Laterality Date   ? HYSTERECTOMY  1994   ? UPPER GASTROINTESTINAL ENDOSCOPY  2009   ? COLONOSCOPY  2009   ? CYSTOCELE REPAIR  2009    with endocele repair   ? RECTOCELE REPAIR  03/2009   ? LIVER BIOPSY  07/17/2010   ? COLONOSCOPY N/A 11/08/2017    Performed by Dawna Part, MD at [facility] ENDO   ? ESOPHAGOGASTRODUODENOSCOPY N/A 11/08/2017    Performed by Dawna Part, MD at [facility] ENDO   ? ESOPHAGOGASTRODUODENOSCOPY WITH BIOPSY - FLEXIBLE  11/08/2017  Performed by Dawna Part, MD at [facility] ENDO   ? COLONOSCOPY WITH HOT BIOPSY FORCEPS REMOVAL TUMOR/ POLYP/ OTHER LESION  11/08/2017    Performed by Dawna Part, MD at [facility] ENDO   ? ESOPHAGOGASTRODUODENOSCOPY WITH BAND LIGATION ESOPHAGEAL/ GASTRIC VARICES - FLEXIBLE N/A 04/10/2018    Performed by Normajean Baxter, MD at [facility] ENDO   ? ESOPHAGOGASTRODUODENOSCOPY WITH BIOPSY - FLEXIBLE N/A 04/10/2018    Performed by Normajean Baxter, MD at [facility] ENDO   ? ESOPHAGOGASTRODUODENOSCOPY WITH CONTROL OF BLEEDING - FLEXIBLE N/A 04/25/2018    Performed by Jolee Ewing, MD at [facility] ENDO   ? ESOPHAGOGASTRODUODENOSCOPY WITH BIOPSY - FLEXIBLE with push enteroscopy N/A 08/28/2018    Performed by Celesta Gentile, MD at [facility] ENDO   ? EGD N/A 10/27/2018    Performed by Eliott Nine, MD at [facility] ENDO   ? ESOPHAGOGASTRODUODENOSCOPY WITH SNARE REMOVAL TUMOR/ POLYP/ OTHER LESION - FLEXIBLE N/A 10/27/2018    Performed by Eliott Nine, MD at [facility] ENDO   ? ESOPHAGOGASTRODUODENOSCOPY WITH BIOPSY - FLEXIBLE N/A 12/16/2018    Performed by Buckles, Vinnie Level, MD at [facility] ENDO   ? ESOPHAGOGASTRODUODENOSCOPY WITH CONTROL OF BLEEDING - FLEXIBLE N/A 12/16/2018    Performed by Buckles, Vinnie Level, MD at [facility] ENDO   ? ESOPHAGOGASTRODUODENOSCOPY WITH SPECIMEN COLLECTION BY BRUSHING/ WASHING N/A 01/22/2019    Performed by Veneta Penton, MD at [facility] ENDO   ? ESOPHAGOGASTRODUODENOSCOPY WITH DILATION ESOPHAGUS WITH BALLOON 30 MM OR GREATER - FLEXIBLE N/A 01/22/2019    Performed by Veneta Penton, MD at [facility] ENDO   ? ESOPHAGOGASTRODUODENOSCOPY WITH BIOPSY - FLEXIBLE N/A 01/22/2019    Performed by Veneta Penton, MD at [facility] ENDO   ? ESOPHAGOGASTRODUODENOSCOPY WITH BIOPSY - FLEXIBLE N/A 06/19/2019    Performed by Dawna Part, MD at [facility] ENDO   ? ESOPHAGOGASTRODUODENOSCOPY [WITH APC]  WITH SPECIMEN COLLECTION BY BRUSHING/ WASHING N/A 08/07/2019    Performed by Dawna Part, MD at [facility] ENDO   ? ESOPHAGOGASTRODUODENOSCOPY WITH SPECIMEN COLLECTION BY BRUSHING/ WASHING N/A 09/10/2019    Performed by Buckles, Vinnie Level, MD at [facility] ENDO   ? ESOPHAGOGASTRODUODENOSCOPY WITH CONTROL OF BLEEDING - FLEXIBLE N/A 09/10/2019    Performed by Buckles, Vinnie Level, MD at [facility] ENDO   ? ESOPHAGOGASTRODUODENOSCOPY WITH CONTROL OF BLEEDING - FLEXIBLE N/A 02/12/2020    Performed by Lenor Derrick, MD at [facility] ENDO   ? CHOLECYSTECTOMY     ? COLONOSCOPY     ? ESOPHAGOGASTRIC FUNDOPLICATION  2003, 2004    laparoscopic   ? ESOPHAGOGASTRIC FUNDOPLICATION     ? HX HYSTERECTOMY     ? PR ESOPHAGOSCOPY FLEXIBLE TRANSORAL DIAGNOSTIC         Social History:  Social History     Tobacco Use   ? Smoking status: Never Smoker   ? Smokeless tobacco: Never Used   Substance Use Topics   ? Alcohol use: No   ? Drug use: No     Social History     Substance and Sexual Activity   Drug Use No       Family History:  Family History   Problem Relation Age of Onset   ? Other Mother    ? Kidney Failure Mother    ? Osteoporosis Mother    ? Cancer Father         esophageal   ? Liver Disease Sister         endstage, s/p liver transplant   ?  Cancer Brother         tonsil, liver    ? Hip Fracture Neg Hx        Vitals:  ED Vitals    Date and Time T BP P RR SPO2P SPO2 User   02/25/20 1340 -- 108/50 92 -- 90 98 % RH   02/25/20 1330 -- 111/51 91 -- 90 100 % RH   02/25/20 1310 -- 107/61 91 -- 89 100 % RH   02/25/20 1303 -- 116/49 89 18 PER MINUTE 93 100 % RH   02/25/20 1254 -- -- 99 12 PER MINUTE 97 100 % RH   02/25/20 1251 -- 103/44 -- -- -- -- RH   02/25/20 1241 36.7 ?C (98.1 ?F) 114/37 89 18 PER MINUTE 89 100 % LS          Physical Exam:  Physical Exam  Vitals signs and nursing note reviewed.   Constitutional:       Appearance: Normal appearance. She is well-developed and normal weight.   HENT:      Head: Normocephalic and atraumatic.      Right Ear: External ear normal.      Left Ear: External ear normal.      Nose: Nose normal.      Mouth/Throat:      Mouth: Mucous membranes are moist.      Pharynx: Oropharynx is clear.   Eyes:      Extraocular Movements: Extraocular movements intact.      Conjunctiva/sclera: Conjunctivae normal.   Neck:      Musculoskeletal: Neck supple.   Cardiovascular:      Rate and Rhythm: Normal rate and regular rhythm.      Pulses: Normal pulses.      Heart sounds: Normal heart sounds.      Comments: 3+ pitting edema to bilateral lower extremities extending up to the knees with 1+ edema noted to bilateral thighs.   Pulmonary:      Effort: Pulmonary effort is normal. No respiratory distress.      Breath sounds: Normal breath sounds.   Abdominal:      General: Bowel sounds are normal. There is distension.      Palpations: Abdomen is soft.      Tenderness: There is no abdominal tenderness.   Genitourinary:     Rectum: Guaiac result positive. External hemorrhoid present.      Comments: Melanotic stool noted on DRE.  Musculoskeletal: Normal range of motion.      Right lower leg: 3+ Pitting Edema present.      Left lower leg: 3+ Pitting Edema present.   Skin:     General: Skin is warm and dry.   Neurological:      Mental Status: She is alert and oriented to person, place, and time. Mental status is at baseline.   Psychiatric:         Mood and Affect: Mood normal.         Behavior: Behavior normal.         Laboratory Results:  Labs Reviewed   PROTIME INR (PT) - Abnormal       Result Value Ref Range Status    INR 1.4 (*) 0.8 - 1.2 Final   COMPREHENSIVE METABOLIC PANEL - Abnormal    Sodium 133 (*) 137 - 147 MMOL/L Final    Potassium 4.9  3.5 - 5.1 MMOL/L Final    Chloride 107  98 - 110 MMOL/L Final    Glucose 108 (*)  70 - 100 MG/DL Final    Blood Urea Nitrogen 30 (*) 7 - 25 MG/DL Final    Creatinine 1.61  0.4 - 1.00 MG/DL Final    Calcium 8.5  8.5 - 10.6 MG/DL Final    Total Protein 5.8 (*) 6.0 - 8.0 G/DL Final    Total Bilirubin 1.2  0.3 - 1.2 MG/DL Final    Albumin 3.1 (*) 3.5 - 5.0 G/DL Final    Alk Phosphatase 167 (*) 25 - 110 U/L Final    AST (SGOT) 43 (*) 7 - 40 U/L Final    CO2 18 (*) 21 - 30 MMOL/L Final    ALT (SGPT) 22  7 - 56 U/L Final    Anion Gap 8  3 - 12 Final    eGFR Non African American 57 (*) >60 mL/min Final    eGFR African American >60  >60 mL/min Final   CBC AND DIFF - Abnormal    White Blood Cells 7.3  4.5 - 11.0 K/UL Final    RBC 2.44 (*) 4.0 - 5.0 M/UL Final    Hemoglobin 5.9 (*) 12.0 - 15.0 GM/DL Final    Hematocrit 09.6 (*) 36 - 45 % Final    MCV 77.1 (*) 80 - 100 FL Final    MCH 24.1 (*) 26 - 34 PG Final    MCHC 31.3 (*) 32.0 - 36.0 G/DL Final    RDW 04.5 (*) 11 - 15 % Final    Platelet Count 252  150 - 400 K/UL Final    MPV 8.8  7 - 11 FL Final    Neutrophils 75  41 - 77 % Final    Lymphocytes 11 (*) 24 - 44 % Final    Monocytes 11  4 - 12 % Final    Eosinophils 2  0 - 5 % Final    Basophils 1  0 - 2 % Final    Absolute Neutrophil Count 5.47  1.8 - 7.0 K/UL Final    Absolute Lymph Count 0.77 (*) 1.0 - 4.8 K/UL Final    Absolute Monocyte Count 0.81 (*) 0 - 0.80 K/UL Final    Absolute Eosinophil Count 0.15  0 - 0.45 K/UL Final    Absolute Basophil Count 0.06  0 - 0.20 K/UL Final    MDW (Monocyte Distribution Width) 23.6 (*) <20.7 Final   POC GLUCOSE - Abnormal    Glucose, POC 116 (*) 70 - 100 MG/DL Final   WUJWJ-19 (SARS-COV-2) PCR    COVID-19 (SARS-CoV-2) PCR Source     Corrected    Value: FLOCKED SWAB  NASOPHARYNGEAL      COVID-19 (SARS-CoV-2) PCR NOT DETECTED  DN-NOT DETECTED Final   PTT (APTT)    APTT 32.8  24.0 - 36.5 SEC Final   CBC AND DIFF   POC GLUCOSE   POC GLUCOSE   PREPARE RBC'S   TRANSFUSE RBC'S          Radiology Interpretation:  IR ASPIRATION/DRAIN (Results Pending)     EKG:  1325  12 lead EKG shows regular rate 91 and sinus rhythm.  PR interval, QRS duration, QT interval within normal limits.   TWI in v1   Interpretation: normal sinus rhythm.      ED Course:  Patient seen and evaluated by the resident and attending physician.  Pertinent physical exam findings described above.  IV access obtained and patient remained on cardiopulmonary telemetry while in the emergency department.  Vitals within appropriate limits.  As patient with known anemia (hemoglobin less than 6 prior to arrival), patient was typed and crossed on arrival.  Labs are redrawn and significant for: Significant microcytic anemia with hemoglobin of 5.9 and hematocrit of 18.8, no evidence of leukocytosis, INR elevated to 1.4, hyponatremia to 133 from 134, at 9.99, hypoalbuminemia to 3.1, elevated AST to 43 and elevated alk phos to 167.  EKG without evidence of arrhythmia or ischemia.  Patient consented for blood and blood transfusion was initiated in the emergency department.  Patient was additionally initiated on Protonix, Rocephin and octreotide as patient with known varices.  Patient remained hemodynamically appropriate while in the emergency department.  At this time, GI consulted and evaluated patient at bedside.  Recommendations to be provided.  Internal medicine contacted and patient discussed.  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  Patient and family informed of plan of care and expressed agreement with and understanding of plan.  Patient admitted to internal medicine service in no acute distress with all questions answered.\      ED Scoring:                             MDM  Reviewed: vitals, nursing note and previous chart  Reviewed previous: labs  Interpretation: labs, ECG and ultrasound        Facility Administered Meds: Medications   pantoprazole (PROTONIX) 80 mg in sodium chloride 0.9% (NS) 100 mL IVPB (has no administration in time range)   cefTRIAXone (ROCEPHIN) IVP 2 g (has no administration in time range)   pantoprazole (PROTONIX) injection 80 mg (80 mg Intravenous Given 02/25/20 1523)   octreotide (SandoSTATIN) BOLUS for continuous infusion 50 mcg (50 mcg Intravenous Bolus from Bag 02/25/20 1524)   cefTRIAXone (ROCEPHIN) IVP 2 g (2 g Intravenous Given 02/25/20 1523)         Clinical Impression:  Clinical Impression   Secondary esophageal varices without bleeding (HCC)   Melena       Disposition/Follow up  ED Disposition     ED Disposition    Admit        No follow-up provider specified.    Medications:  Current Discharge Medication List          Procedure Notes:  Critical Care  Performed by: Bethann Humble, MD  Authorized by: Bethann Humble, MD   Total critical care time in minutes: 60  Critical care was exclusive of separately billable procedures and treating other patients and teaching time.  Critical care was necessary to treat or prevent imminent or life-threatening deterioration of the following conditions: GI bleed   Critical care was spent personally by me on the following activities: blood draw for specimens, development of treatment plan with patient or surrogate, discussions with consultants, interpretation of cardiac output measurements, evaluation of patient's response to treatment, examination of patient, obtaining history from patient or surrogate, ordering and performing treatments and interventions, ordering and review of laboratory studies, ordering and review of radiographic studies, pulse oximetry, re-evaluation of patient's condition and review of old charts.  Attending Attestation: I personally performed the procedure myself.          Attestation / Supervision:  Jeronimo Norma, am scribing for and in the presence of Era Bumpers, MD.    Daryel November    Attestation /  Supervision Note concerning Donnalee Curry: Randal Buba, MD, personally performed the services described in this documentation as scribed in my presence and it is both accurate and complete.    Kathryne Sharper, MD

## 2020-02-26 ENCOUNTER — Inpatient Hospital Stay: Admit: 2020-02-26 | Discharge: 2020-02-26 | Payer: MEDICARE

## 2020-02-26 ENCOUNTER — Encounter: Admit: 2020-02-26 | Discharge: 2020-02-26 | Payer: MEDICARE

## 2020-02-26 DIAGNOSIS — E119 Type 2 diabetes mellitus without complications: Secondary | ICD-10-CM

## 2020-02-26 DIAGNOSIS — K7460 Unspecified cirrhosis of liver: Secondary | ICD-10-CM

## 2020-02-26 DIAGNOSIS — K5730 Diverticulosis of large intestine without perforation or abscess without bleeding: Secondary | ICD-10-CM

## 2020-02-26 DIAGNOSIS — K760 Fatty (change of) liver, not elsewhere classified: Secondary | ICD-10-CM

## 2020-02-26 DIAGNOSIS — E669 Obesity, unspecified: Secondary | ICD-10-CM

## 2020-02-26 DIAGNOSIS — I729 Aneurysm of unspecified site: Secondary | ICD-10-CM

## 2020-02-26 DIAGNOSIS — R0600 Dyspnea, unspecified: Secondary | ICD-10-CM

## 2020-02-26 DIAGNOSIS — I10 Essential (primary) hypertension: Secondary | ICD-10-CM

## 2020-02-26 DIAGNOSIS — Z01810 Encounter for preprocedural cardiovascular examination: Secondary | ICD-10-CM

## 2020-02-26 DIAGNOSIS — J329 Chronic sinusitis, unspecified: Secondary | ICD-10-CM

## 2020-02-26 LAB — POC GLUCOSE: Lab: 198 mg/dL — ABNORMAL HIGH (ref 70–100)

## 2020-02-26 MED ORDER — DIPHENHYDRAMINE HCL 50 MG/ML IJ SOLN
25 mg | INTRAVENOUS | 0 refills | Status: DC | PRN
Start: 2020-02-26 — End: 2020-02-26

## 2020-02-26 MED ORDER — ALBUMIN, HUMAN 25 % IV SOLP
0 refills | Status: DC
Start: 2020-02-26 — End: 2020-02-26
  Administered 2020-02-26 (×4): 12.5 g via INTRAVENOUS

## 2020-02-26 MED ORDER — DIPHENHYDRAMINE HCL 50 MG/ML IJ SOLN
25 mg | INTRAVENOUS | 0 refills | Status: DC | PRN
Start: 2020-02-26 — End: 2020-02-28

## 2020-02-26 MED ORDER — PROPOFOL 10 MG/ML IV EMUL 20 ML (INFUSION)(AM)(OR)
INTRAVENOUS | 0 refills | Status: DC
Start: 2020-02-26 — End: 2020-02-26
  Administered 2020-02-26: 14:00:00 120 ug/kg/min via INTRAVENOUS

## 2020-02-26 MED ORDER — LIDOCAINE (PF) 200 MG/10 ML (2 %) IJ SYRG
0 refills | Status: DC
Start: 2020-02-26 — End: 2020-02-26
  Administered 2020-02-26: 14:00:00 80 mg via INTRAVENOUS

## 2020-02-26 MED ORDER — FENTANYL CITRATE (PF) 50 MCG/ML IJ SOLN
25 ug | INTRAVENOUS | 0 refills | Status: CN | PRN
Start: 2020-02-26 — End: ?

## 2020-02-26 MED ORDER — PANTOPRAZOLE 40 MG IV SOLR
40 mg | Freq: Every day | INTRAVENOUS | 0 refills | Status: DC
Start: 2020-02-26 — End: 2020-02-28
  Administered 2020-02-26 – 2020-02-28 (×3): 40 mg via INTRAVENOUS

## 2020-02-26 MED ORDER — EPINEPHRINE HCL (PF) 1 MG/ML (1 ML) IJ SOLN
.3-.5 mg | INTRAMUSCULAR | 0 refills | Status: DC | PRN
Start: 2020-02-26 — End: 2020-02-26

## 2020-02-26 MED ORDER — HYDROXYZINE HCL 25 MG PO TAB
25 mg | Freq: Once | ORAL | 0 refills | Status: CP
Start: 2020-02-26 — End: ?
  Administered 2020-02-27: 05:00:00 25 mg via ORAL

## 2020-02-26 MED ORDER — ONDANSETRON HCL (PF) 4 MG/2 ML IJ SOLN
4 mg | Freq: Once | INTRAVENOUS | 0 refills | Status: CN | PRN
Start: 2020-02-26 — End: ?

## 2020-02-26 MED ORDER — IRON SUCROSE 300 MG IRON/15 ML IV SOLN
300 mg | Freq: Every day | INTRAVENOUS | 0 refills | Status: CP
Start: 2020-02-26 — End: ?
  Administered 2020-02-26 – 2020-02-27 (×4): 300 mg via INTRAVENOUS

## 2020-02-26 MED ORDER — IRON SUCROSE 300 MG IRON/15 ML IV SOLN
300 mg | Freq: Every day | INTRAVENOUS | 0 refills | Status: DC
Start: 2020-02-26 — End: 2020-02-26

## 2020-02-26 MED ORDER — PROPOFOL INJ 10 MG/ML IV VIAL
0 refills | Status: DC
Start: 2020-02-26 — End: 2020-02-26
  Administered 2020-02-26: 14:00:00 40 mg via INTRAVENOUS
  Administered 2020-02-26: 14:00:00 20 mg via INTRAVENOUS

## 2020-02-26 MED ORDER — EPINEPHRINE HCL (PF) 1 MG/ML (1 ML) IJ SOLN
.3-.5 mg | INTRAMUSCULAR | 0 refills | Status: DC | PRN
Start: 2020-02-26 — End: 2020-02-28

## 2020-02-26 MED ORDER — STERILE WATER/SIMETHICONE IRRIGATION
0 refills | Status: DC
Start: 2020-02-26 — End: 2020-02-26
  Administered 2020-02-26: 14:00:00 50 mL

## 2020-02-26 MED ORDER — CEFTRIAXONE INJ 1GM IVP
1 g | INTRAVENOUS | 0 refills | Status: DC
Start: 2020-02-26 — End: 2020-02-28
  Administered 2020-02-26 – 2020-02-27 (×2): 1 g via INTRAVENOUS

## 2020-02-26 MED ORDER — LACTATED RINGERS IV SOLP
1000 mL | INTRAVENOUS | 0 refills | Status: DC
Start: 2020-02-26 — End: 2020-02-26
  Administered 2020-02-26: 14:00:00 1000.000 mL via INTRAVENOUS
  Administered 2020-02-26: 12:00:00 1000 mL via INTRAVENOUS

## 2020-02-26 NOTE — Anesthesia Post-Procedure Evaluation
Post-Anesthesia Evaluation    Name: Kristin Moody      MRN: [id number]     DOB: [birth year]     Age: 64 y.o.     Sex: female   __________________________________________________________________________     Procedure Information     Anesthesia Start Date/Time: 02/26/20 0903    Procedure: ESOPHAGOGASTRODUODENOSCOPY WITH BIOPSY - FLEXIBLE (N/A )    Location: ENDO 3 / ENDO/GI    Surgeons: Buckles, Darnelle Maffucci, MD          Post-Anesthesia Vitals  BP: 99/58 (04/09 0940)  Temp: 36.4 C (97.6 F) (04/09 0920)  Pulse: 89 (04/09 0940)  Respirations: 18 PER MINUTE (04/09 0940)  SpO2: 93 % (04/09 0940)  SpO2 Pulse: 89 (04/09 0940)   Vitals Value Taken Time   BP 99/58 02/26/20 0940   Temp 36.4 C (97.6 F) 02/26/20 0920   Pulse 89 02/26/20 0940   Respirations 18 PER MINUTE 02/26/20 0940   SpO2 93 % 02/26/20 0940         Post Anesthesia Evaluation Note    Evaluation location: Pre/Post  Patient participation: recovered; patient participated in evaluation  Level of consciousness: alert    Pain score: 0  Pain management: adequate    Hydration: normovolemia  Temperature: 36.0C - 38.4C  Airway patency: adequate    Perioperative Events       Post-op nausea and vomiting: no PONV    Postoperative Status  Cardiovascular status: hemodynamically stable  Respiratory status: spontaneous ventilation  Follow-up needed: none  Additional comments: Post-Anesthesia Evaluation Attestation: I reviewed and agree the indicated post-anesthesia care was provided. I have reviewed key portions of the indicated post anesthesia care. I have examined the patient's vitals, physical status, and complications and agree with what is documented.    Staff name:  Maureen Ralphs, MD Date:  02/26/2020          Perioperative Events  Perioperative Event: No

## 2020-02-26 NOTE — Care Plan
Problem: Discharge Planning  Goal: Participation in plan of care  02/25/2020 2231 by Rosanne Ashing, RN  Flowsheets (Taken 02/25/2020 2231)  Participation in Plan of Care: Involve patient/caregiver in care planning decision making  02/25/2020 2231 by Rosanne Ashing, RN  Outcome: Goal Ongoing  Goal: Knowledge regarding plan of care  02/25/2020 2231 by Rosanne Ashing, [place] (Taken 02/25/2020 2231)  Knowledge regarding plan of care:   Provide admission education to parent/caregiver   Provide fall prevention education   Provide VTE signs and symptoms education   Provide infection prevention education   Provide plan of care education   Provide medication management education   Provide procedural and treatment education  02/25/2020 2231 by Rosanne Ashing, RN  Outcome: Goal Ongoing  Goal: Prepared for discharge  02/25/2020 2231 by Rosanne Ashing, [place] (Taken 02/25/2020 2231)  Prepared for discharge:   Complete ADL ability assessment   Provide safe use medical equipment education   Provide diet and oral health education   Collaborate with multidisciplinary team for hospital discharge coordination  02/25/2020 2231 by Rosanne Ashing, RN  Outcome: Goal Ongoing

## 2020-02-26 NOTE — Telephone Encounter
Verified active Insurance via [employer] and active [place] on file  [facility] [employer] [facility] [place] Reference Number:  [id number]  [place] Reference Number:  [id number]  No Listing Auth Required with [facility]

## 2020-02-27 ENCOUNTER — Encounter: Admit: 2020-02-27 | Discharge: 2020-02-27 | Payer: MEDICARE

## 2020-02-27 DIAGNOSIS — R0600 Dyspnea, unspecified: Secondary | ICD-10-CM

## 2020-02-27 DIAGNOSIS — E119 Type 2 diabetes mellitus without complications: Secondary | ICD-10-CM

## 2020-02-27 DIAGNOSIS — K5730 Diverticulosis of large intestine without perforation or abscess without bleeding: Secondary | ICD-10-CM

## 2020-02-27 DIAGNOSIS — I729 Aneurysm of unspecified site: Secondary | ICD-10-CM

## 2020-02-27 DIAGNOSIS — J329 Chronic sinusitis, unspecified: Secondary | ICD-10-CM

## 2020-02-27 DIAGNOSIS — K7460 Unspecified cirrhosis of liver: Secondary | ICD-10-CM

## 2020-02-27 DIAGNOSIS — I10 Essential (primary) hypertension: Secondary | ICD-10-CM

## 2020-02-27 DIAGNOSIS — E669 Obesity, unspecified: Secondary | ICD-10-CM

## 2020-02-27 DIAGNOSIS — K760 Fatty (change of) liver, not elsewhere classified: Secondary | ICD-10-CM

## 2020-02-27 DIAGNOSIS — Z01810 Encounter for preprocedural cardiovascular examination: Secondary | ICD-10-CM

## 2020-02-27 MED ORDER — MIDODRINE 5 MG PO TAB
10 mg | Freq: Three times a day (TID) | ORAL | 0 refills | Status: DC
Start: 2020-02-27 — End: 2020-02-28
  Administered 2020-02-27 – 2020-02-28 (×4): 10 mg via ORAL

## 2020-02-28 ENCOUNTER — Encounter: Admit: 2020-02-28 | Discharge: 2020-02-28 | Payer: MEDICARE

## 2020-02-28 MED ORDER — MIDODRINE 10 MG PO TAB
10 mg | ORAL_TABLET | Freq: Three times a day (TID) | ORAL | 1 refills | Status: DC
Start: 2020-02-28 — End: 2020-05-24
  Filled 2020-02-28: qty 90, 30d supply, fill #1

## 2020-02-28 MED ORDER — CIPROFLOXACIN HCL 500 MG PO TAB
500 mg | ORAL_TABLET | Freq: Every day | ORAL | 1 refills | 10.00000 days | Status: DC
Start: 2020-02-28 — End: 2020-06-07

## 2020-02-28 MED ORDER — CIPROFLOXACIN HCL 500 MG PO TAB
500 mg | ORAL_TABLET | Freq: Two times a day (BID) | ORAL | 0 refills | Status: CN
Start: 2020-02-28 — End: ?

## 2020-02-29 ENCOUNTER — Encounter: Admit: 2020-02-29 | Discharge: 2020-02-29 | Payer: MEDICARE

## 2020-02-29 DIAGNOSIS — D500 Iron deficiency anemia secondary to blood loss (chronic): Secondary | ICD-10-CM

## 2020-02-29 DIAGNOSIS — K7460 Unspecified cirrhosis of liver: Secondary | ICD-10-CM

## 2020-02-29 NOTE — Progress Notes
Interventional Radiology Outpatient Scheduling Checklist    1.Name of Procedure(s):Paracentesis    2.Date of Procedure:03/04/2020       3.Arrival Time:1100    4.Procedure Time:1200    5.Correct Procedural Room Assignment:IR BH #7 SONO    6.Blood Thinners Triaged and instructed per protocol: Y/N/NA: NA  Confirmed accurate instructions sent to patient: Y/N: NA     7.Procedure Order Verified: Y/N: Yes    9.Patient instructed to have a driver: Y/N/NA: Yes    10.Patient instructed on NPO status: Y/N/NA: NO Restrictions.  Confirmed accurate instructions sent to patient: Y/N: Yes    11.Specimen needed: Y/N/NA: Yes   Verified Order placed: Y/N: Yes    12.Allergies Verified:Y/N:Yes    13.Is there an Iodine Allergy: Y/N:No  Does the Procedure Require contrast: Y/N:NO  If so, was the IR- Contrast Allergy Pre-Procedure Medication protocol ordered: Y/NA: NA    14.Does the patient have labs according to IR Pre-procedure Laboratory Parameter policy: Y/N/NA: Yes  If No, was the patient instructed to obtain labs prior to procedure: Y/N/NA: NA     15.Will the patient need to be admitted or have a possible admission: Y/N: No  If yes, confirmed accurate instructions sent to patient: Y/N/NA: NA     16.Patient States Understanding:Y/N: Yes    17.History of OSA:Y/N: No  If yes, confirm request to bring CPAP sent to patient: Y/N/NA: NA    18. Patient declines electronic procedure instructions: Y/N: No

## 2020-02-29 NOTE — Telephone Encounter
Spoke with patient after discharge over the weekend. She is agreeable to repeat labs (CBC and CMP) this week. She is planning to schedule paracentesis @ [facility] Friday 4/16 and will have labs drawn at that time.

## 2020-03-04 ENCOUNTER — Encounter: Admit: 2020-03-04 | Discharge: 2020-03-04 | Payer: MEDICARE

## 2020-03-04 ENCOUNTER — Ambulatory Visit: Admit: 2020-03-04 | Discharge: 2020-03-04 | Payer: MEDICARE

## 2020-03-04 DIAGNOSIS — E119 Type 2 diabetes mellitus without complications: Secondary | ICD-10-CM

## 2020-03-04 DIAGNOSIS — J329 Chronic sinusitis, unspecified: Secondary | ICD-10-CM

## 2020-03-04 DIAGNOSIS — K31819 Angiodysplasia of stomach and duodenum without bleeding: Secondary | ICD-10-CM

## 2020-03-04 DIAGNOSIS — K760 Fatty (change of) liver, not elsewhere classified: Secondary | ICD-10-CM

## 2020-03-04 DIAGNOSIS — I10 Essential (primary) hypertension: Secondary | ICD-10-CM

## 2020-03-04 DIAGNOSIS — E669 Obesity, unspecified: Secondary | ICD-10-CM

## 2020-03-04 DIAGNOSIS — I729 Aneurysm of unspecified site: Secondary | ICD-10-CM

## 2020-03-04 DIAGNOSIS — K7460 Unspecified cirrhosis of liver: Secondary | ICD-10-CM

## 2020-03-04 DIAGNOSIS — R188 Other ascites: Secondary | ICD-10-CM

## 2020-03-04 DIAGNOSIS — Z01810 Encounter for preprocedural cardiovascular examination: Secondary | ICD-10-CM

## 2020-03-04 DIAGNOSIS — K7469 Other cirrhosis of liver: Secondary | ICD-10-CM

## 2020-03-04 DIAGNOSIS — R945 Abnormal results of liver function studies: Secondary | ICD-10-CM

## 2020-03-04 DIAGNOSIS — D500 Iron deficiency anemia secondary to blood loss (chronic): Secondary | ICD-10-CM

## 2020-03-04 DIAGNOSIS — K5730 Diverticulosis of large intestine without perforation or abscess without bleeding: Secondary | ICD-10-CM

## 2020-03-04 DIAGNOSIS — R0600 Dyspnea, unspecified: Secondary | ICD-10-CM

## 2020-03-04 LAB — CBC AND DIFF
Lab: 0 10*3/uL (ref 0–0.20)
Lab: 0.2 10*3/uL (ref 0–0.45)
Lab: 0.7 10*3/uL — ABNORMAL LOW (ref 1.0–4.8)
Lab: 0.8 10*3/uL — ABNORMAL HIGH (ref 0–0.80)
Lab: 1 % (ref 0–2)
Lab: 10 % — ABNORMAL LOW (ref 24–44)
Lab: 12 % (ref 4–12)
Lab: 166 10*3/uL (ref 150–400)
Lab: 25 % — ABNORMAL HIGH (ref 11–15)
Lab: 27 pg (ref >60)
Lab: 28 % — ABNORMAL LOW (ref 36–45)
Lab: 3.3 M/UL — ABNORMAL LOW (ref 4.0–5.0)
Lab: 32 g/dL (ref 32.0–36.0)
Lab: 4 % (ref 0–5)
Lab: 4.9 10*3/uL (ref 1.8–7.0)
Lab: 6.8 K/UL — ABNORMAL HIGH (ref 4.5–11.0)
Lab: 73 % (ref 41–77)
Lab: 8.9 FL (ref 7–11)
Lab: 86 FL — ABNORMAL LOW (ref >60)
Lab: 9.4 g/dL — ABNORMAL LOW (ref 12.0–15.0)

## 2020-03-04 LAB — GRAM STAIN

## 2020-03-04 LAB — COMPREHENSIVE METABOLIC PANEL
Lab: 1.4 mg/dL — ABNORMAL HIGH (ref 0.3–1.2)
Lab: 110 MMOL/L (ref 98–110)
Lab: 137 MMOL/L (ref 137–147)
Lab: 194 U/L — ABNORMAL HIGH (ref 25–110)
Lab: 3 g/dL — ABNORMAL LOW (ref 3.5–5.0)
Lab: 4.4 MMOL/L (ref 3.5–5.1)
Lab: 96 mg/dL (ref 70–100)

## 2020-03-04 LAB — PROTIME INR (PT): Lab: 1.4 — ABNORMAL HIGH (ref 0.8–1.2)

## 2020-03-04 LAB — POC GLUCOSE: Lab: 106 mg/dL — ABNORMAL HIGH (ref 70–100)

## 2020-03-04 MED ORDER — ALBUMIN, HUMAN 25 % IV SOLP: 0 refills | Status: CP

## 2020-03-04 NOTE — Other
Immediate Post Procedure Note    Date:  03/04/2020                                         Attending Physician:   Caden Fukushima MD  Performing Provider:  Sebert Stollings M Bradly Sangiovanni, MD    Consent:  Consent obtained from patient.  Time out performed: Consent obtained, correct patient verified, correct procedure verified, correct site verified, patient marked as necessary.  Pre/Post Procedure Diagnosis:  ascites  Indications:  ascites    Anesthesia: Local  Procedure(s):  paracentesis  Findings:  Straw colored fluid     Estimated Blood Loss:  None/Negligible  Specimen(s) Removed/Disposition:  Yes, sent to pathology  Complications: None  Patient Tolerated Procedure: Well  Post-Procedure Condition:  stable    Enijah Furr M Madalene Mickler, MD  Pager [phone]

## 2020-03-04 NOTE — H&P (View-Only)
IR Pre-Procedure History and Physical/Sedation Plan    Procedure Date: 03/04/2020     Planned Procedure(s):  Ultrasound-guided paracentesis     Indication:  Fluid testing; Therapeutic drainage  __________________________________________________________________    Chief Complaint:  Ascites    History of Present Illness: Kristin Moody is a 64 y.o. female with a history as listed below who presents today for procedure.    Patient Active Problem List    Diagnosis Date Noted   ? 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 Hypotension 02/10/2020   ? 'light-for-dates' infant with signs of fetal malnutrition 01/22/2020   ? 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 Anemia 09/28/2019   ? Encephalopathy 08/12/2019   ? Spasticity 08/10/2019   ? Hyperreflexia 08/10/2019   ? 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 Right leg weakness 05/14/2019   ? 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 Iron (Fe) deficiency anemia 01/19/2019   ? Preop cardiovascular exam 01/07/2019   ? Pre-transplant evaluation for liver transplant 01/06/2019   ? Cirrhosis (HCC) 12/16/2018   ? Acute on chronic anemia 12/16/2018   ? Lactic acidosis 12/16/2018   ? GI bleed 10/24/2018   ? Portal hypertension (HCC) 08/28/2018   ? Confusion 08/27/2018   ? Dyspnea 08/27/2018   ? Chronic abdominal pain 08/27/2018   ? 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 Acute on chronic blood loss anemia 08/27/2018   ? Iron deficiency anemia 04/13/2018   ? Pneumonia due to infectious organism 04/13/2018   ? Melena 04/10/2018   ? Esophageal varices without bleeding (HCC) 02/25/2018   ? Cirrhosis of liver without ascites (HCC) 02/25/2018   ? 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 Lower abdominal pain 10/24/2014   ? Chest discomfort 09/21/2014   ? Essential hypertension 08/13/2012   ? Abnormal liver function tests 05/13/2011   ? Fatty liver disease, nonalcoholic 05/13/2011   ? Obesity (BMI 30-39.9) 05/13/2011   ? Slow transit constipation 05/13/2011     Medical History:   Diagnosis Date   ? Aneurysm (HCC)    ? 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 Diverticulosis of colon     descending and sigmoid colon   ? Dyspnea    ? Essential hypertension 08/13/2012   ? Fatty infiltration of liver    ? HTN (hypertension)    ? Obesity (BMI 30-39.9) 05/13/2011   ? Preop cardiovascular exam 01/07/2019   ? Sinus infection    ? Type 2 diabetes mellitus (HCC) 09/21/2014      Surgical History:   Procedure Laterality Date   ? HYSTERECTOMY  1994   ? UPPER GASTROINTESTINAL ENDOSCOPY  2009   ? COLONOSCOPY  2009   ? CYSTOCELE REPAIR  2009    with endocele repair   ? RECTOCELE REPAIR  03/2009   ? LIVER BIOPSY  07/17/2010   ? COLONOSCOPY N/A 11/08/2017    Performed by Dawna Part, MD at [facility] ENDO   ? ESOPHAGOGASTRODUODENOSCOPY N/A 11/08/2017    Performed by Dawna Part, MD at [facility] ENDO   ? ESOPHAGOGASTRODUODENOSCOPY WITH BIOPSY - FLEXIBLE  11/08/2017    Performed by Dawna Part, MD at [facility] ENDO   ? COLONOSCOPY WITH HOT BIOPSY FORCEPS REMOVAL TUMOR/ POLYP/ OTHER LESION  11/08/2017    Performed by Dawna Part, MD at [facility] ENDO   ? ESOPHAGOGASTRODUODENOSCOPY WITH BAND LIGATION ESOPHAGEAL/ GASTRIC  VARICES - FLEXIBLE N/A 04/10/2018    Performed by Normajean Baxter, MD at [facility] ENDO   ? ESOPHAGOGASTRODUODENOSCOPY WITH BIOPSY - FLEXIBLE N/A 04/10/2018    Performed by Normajean Baxter, MD at [facility] ENDO   ? ESOPHAGOGASTRODUODENOSCOPY WITH CONTROL OF BLEEDING - FLEXIBLE N/A 04/25/2018    Performed by Jolee Ewing, MD at [facility] ENDO   ? ESOPHAGOGASTRODUODENOSCOPY WITH BIOPSY - FLEXIBLE with push enteroscopy N/A 08/28/2018    Performed by Celesta Gentile, MD at [facility] ENDO   ? EGD N/A 10/27/2018    Performed by Eliott Nine, MD at [facility] ENDO   ? ESOPHAGOGASTRODUODENOSCOPY WITH SNARE REMOVAL TUMOR/ POLYP/ OTHER LESION - FLEXIBLE N/A 10/27/2018    Performed by Eliott Nine, MD at [facility] ENDO   ? ESOPHAGOGASTRODUODENOSCOPY WITH BIOPSY - FLEXIBLE N/A 12/16/2018    Performed by Buckles, Vinnie Level, MD at [facility] ENDO   ? ESOPHAGOGASTRODUODENOSCOPY WITH CONTROL OF BLEEDING - FLEXIBLE N/A 12/16/2018    Performed by Buckles, Vinnie Level, MD at [facility] ENDO   ? ESOPHAGOGASTRODUODENOSCOPY WITH SPECIMEN COLLECTION BY BRUSHING/ WASHING N/A 01/22/2019    Performed by Veneta Penton, MD at [facility] ENDO   ? ESOPHAGOGASTRODUODENOSCOPY WITH DILATION ESOPHAGUS WITH BALLOON 30 MM OR GREATER - FLEXIBLE N/A 01/22/2019    Performed by Veneta Penton, MD at [facility] ENDO   ? ESOPHAGOGASTRODUODENOSCOPY WITH BIOPSY - FLEXIBLE N/A 01/22/2019    Performed by Veneta Penton, MD at [facility] ENDO   ? ESOPHAGOGASTRODUODENOSCOPY WITH BIOPSY - FLEXIBLE N/A 06/19/2019    Performed by Dawna Part, MD at [facility] ENDO   ? ESOPHAGOGASTRODUODENOSCOPY [WITH APC]  WITH SPECIMEN COLLECTION BY BRUSHING/ WASHING N/A 08/07/2019    Performed by Dawna Part, MD at [facility] ENDO   ? ESOPHAGOGASTRODUODENOSCOPY WITH SPECIMEN COLLECTION BY BRUSHING/ WASHING N/A 09/10/2019    Performed by Buckles, Vinnie Level, MD at [facility] ENDO   ? ESOPHAGOGASTRODUODENOSCOPY WITH CONTROL OF BLEEDING - FLEXIBLE N/A 09/10/2019    Performed by Buckles, Vinnie Level, MD at [facility] ENDO   ? ESOPHAGOGASTRODUODENOSCOPY WITH CONTROL OF BLEEDING - FLEXIBLE N/A 02/12/2020    Performed by Lenor Derrick, MD at [facility] ENDO   ? ESOPHAGOGASTRODUODENOSCOPY WITH BIOPSY - FLEXIBLE N/A 02/26/2020    Performed by Buckles, Vinnie Level, MD at [facility] ENDO   ? CHOLECYSTECTOMY     ? COLONOSCOPY     ? ESOPHAGOGASTRIC FUNDOPLICATION  2003, 2004    laparoscopic   ? ESOPHAGOGASTRIC FUNDOPLICATION     ? HX HYSTERECTOMY     ? PR ESOPHAGOSCOPY FLEXIBLE TRANSORAL DIAGNOSTIC        Social History     Tobacco Use   ? Smoking status: Never Smoker   ? Smokeless tobacco: Never Used   Substance Use Topics   ? Alcohol use: No      Family History   Problem Relation Age of Onset   ? Other Mother    ? Kidney Failure Mother    ? Osteoporosis Mother    ? Cancer Father         esophageal   ? Liver Disease Sister         endstage, s/p liver transplant   ? Cancer Brother         tonsil, liver    ? Hip Fracture Neg Hx       Medications Prior to Admission   Medication Sig Dispense Refill Last Dose   ? atorvastatin (LIPITOR) 10 mg tablet TAKE 1 TABLET BY MOUTH EVERY DAY 90 tablet 3  03/04/2020   ? cetirizine (ZYRTEC) 10 mg tablet Take 10 mg by mouth daily as needed for Allergy symptoms.   03/04/2020   ? ciprofloxacin (CIPRO) 500 mg tablet Take one tablet by mouth daily. 30 tablet 1 03/04/2020   ? duloxetine DR (CYMBALTA) 60 mg capsule Take 60 mg by mouth daily.   03/04/2020   ? midodrine (PROAMITINE) 10 mg tablet Take one tablet by mouth three times daily. 90 tablet 1 03/04/2020   ? pantoprazole DR (PROTONIX) 40 mg tablet Take one tablet by mouth twice daily. 60 tablet 3 03/04/2020   ? polyethylene glycol 3350 (MIRALAX) 17 g packet Take one packet by mouth twice daily.      ? promethazine (PHENERGAN) 25 mg tablet Take 25 mg by mouth every 4 hours as needed. Just uses it with Tramadol      ? rifAXIMin (XIFAXAN) 550 mg tablet Take one tablet by mouth twice daily. 180 tablet 3 03/04/2020   ? zinc sulfate 220 mg (50 mg elemental zinc) capsule Take one capsule by mouth daily. 90 capsule 3 03/04/2020     Allergies   Allergen Reactions   ? Tegaderm BLISTERS   ? Adhesive Tape (Rosins) RASH   ? Sulfa (Sulfonamide Antibiotics) RASH   ? Codeine NAUSEA AND VOMITING   ? Morphine SEE COMMENTS     Pt reports burns her veins   ? Penicillin G SEE COMMENTS     Throat issues, blisters in throat    ? Tramadol NAUSEA ONLY and ITCHING     Takes this medication regularly        Review of Systems  Constitutional: negative for fevers and chills  Respiratory: negative  Cardiovascular: negative  Gastrointestinal: positive for abdominal pain, negative for nausea and vomiting    Physical Exam:  Vital Signs: Last Filed In 24 Hours Vital Signs: 24 Hour Range   BP: 117/68 (04/16 1141)  Temp: 36.8 ?C (98.2 ?F) (04/16 1141)  Pulse: 86 (04/16 1141)  Respirations: 24 PER MINUTE (04/16 1141) SpO2: 97 % (04/16 1141) BP: (117)/(68)   Temp:  [36.8 ?C (98.2 ?F)]   Pulse:  [86]   Respirations:  [24 PER MINUTE]   SpO2:  [97 %]    Intensity Pain Scale (Self Report): 5 (03/04/20 1141)      General appearance: alert and no distress noted.  Neurologic: Grossly normal.  Lungs: Non labored.  Heart: regular rate and rhythm  Abdomen: distended    Pre-procedure anxiolysis plan: N/A  Sedation/Medication Plan: Local anesthetic  Personal history of sedation complications: Denies adverse event.   Family history of sedation complications: Denies adverse event.   Medications for Reversal: NA  Discussion/Reviews:  Physician has discussed risks and alternatives of this type of sedation and above planned procedures with patient    NPO Status: NA  Airway:  NA  Head and Neck: NA  Mouth: NA   Anesthesia Classification:  ASA III (A patient with a severe systemic disease that limits activity, but is not incapacitating)  Pregnancy Status: Not Pregnant    Lab/Radiology/Other Diagnostic Tests:  Labs:  Pertinent labs reviewed           Dollene Primrose, APRN-NP  Pager [phone]

## 2020-03-04 NOTE — Patient Instructions
Paracentesis  Your healthcare provider recommends that you have paracentesis. This is a procedure to remove extra fluid from your belly (abdomen). A needle is used to drain the fluid. A small sample of fluid may be taken and tested for problems. If the fluid buildup is causing discomfort or pain, all of the fluid may be drained. To do this, a tube is attached to the needle. The fluid is drained into a container that sits outside of the body. If symptoms are severe, paracentesis may be done as an emergency procedure. Otherwise, it will be scheduled ahead of time. Read on to learn more about paracentesis and how it works.     Understanding ascites  Many of the body?s organs, including the liver and intestines, are inside the belly (abdomen). The organs are covered in a thin membrane called the?peritoneum .?The peritoneum has 2 layers. It makes a fluid that allows the layers to glide smoothly past each other. If this fluid builds up in the belly, the condition is called?ascites .?Ascites causes pain and discomfort. It can also make it hard to breathe. Fluid can build up for a number of reasons. These include chronic liver disease (cirrhosis), heart or kidney failure, and cancer. Your provider can tell you more about the cause of your ascites.   How paracentesis works  The goal of paracentesis may be to help diagnose the cause of the excess fluid. Or, the goal may be to drain excess fluid from the abdomen. In some cases, fluid returns and the procedure needs to be repeated.   Before the procedure  ? Tell your provider about any medicines you are taking. This includes all prescription medicines, over-the-counter medicines, street drugs, herbs, vitamins, and other supplements.  ? Tell your provider about any allergies you have.  ? Before the procedure begins, you?ll be asked to empty your bladder. This helps prevent injury to the bladder during the procedure. If needed, a thin tube (Foley catheter) may be placed into your bladder to drain urine during the procedure. This tube is removed after the procedure.  ? An IV (intravenous) line?may be?put into a vein in your arm or hand. This line supplies fluids and medicines.    During the procedure  ? You are awake during the procedure.  ? An imaging method called ultrasound may be used to guide the procedure. It shows live images of the inside of your belly on a video screen. This helps the provider find the site of the excess fluid inside your belly and decide where to insert the needle.  ? A numbing medicine (local anesthesia) is injected into your belly where the needle will be inserted.  ? Once the skin is numb, the provider carefully inserts the needle into the belly. This causes the needle to fill with fluid.  ? The needle may be removed with only a small sample of fluid. This sample is sent to a lab for testing. Getting a sample takes about 10 to 15 minutes.  ? Or, a tube may be attached to the needle so that more of the excess fluid can be drained. The tube may be taped or stitched into place. This keeps it from pulling the needle out of your belly.  ? How long it takes to drain all of the fluid varies for each person. In most cases, it takes about 30 minutes. Your provider will let you know if the procedure is expected to take longer than usual.  ? Once all of the  fluid is drained, the needle and tube are removed.  ? Pressure is put on the puncture site to stop any fluid leakage or bleeding.  ? A small bandage is placed over the puncture site. Albumin may be given during or after the procedure to prevent low blood pressure or kidney problems.    After the procedure  You may be taken to a recovery room to rest after the procedure. If you are in pain, you will be given medicine as needed. You will likely be sent home 1 to 2 hours after the procedure is done. When you leave the hospital, have an adult family member or friend drive you home. If you are staying in the hospital, you will return to your hospital room. If your fluid is infected, you will be sent home on antibiotics. In some cases, the paracentesis may need to be repeated if the fluid returns. You may need to take medicines that increase urination (diuretics) to decrease fluid buildup.   Risks and possible complications of paracentesis  This procedure is considered safe. But like all procedures, it carries some risks. These include the following:   ? Bleeding  ? Infection  ? Injury to structures in the belly  ? Fast drop in blood pressure  ? Kidney problems after the procedure  When to seek medical care  Call your healthcare provider if you notice any of the following after the procedure:  ? A fever of 100.4? F ( 38.0?C) or higher, or as directed by your provider  ? Chills  ? Trouble breathing  ? Pain that does not go away even after taking pain medicine  ? Belly pain not caused by having the skin punctured  ? Bleeding from the puncture site  ? More than a small amount of fluid leakage from the puncture site  ? Swollen belly  ? Signs of infection at the puncture site. These include increased pain, redness, or swelling, as well as warmth or bad-smelling drainage.  ? Blood in your urine  ? Dizziness, lightheadedness, or fainting    Please call IR department with any questions [phone].  [employer] last reviewed this educational content on 10/19/2018  ? 2000-2020 [employer], [place]. All rights reserved. This information is not intended as a substitute for professional medical care. Always follow your healthcare professional's instructions.

## 2020-03-04 NOTE — Progress Notes
Procedural education done with pt who verbalized understanding of procedure. All questions answered. Pt in position of comfort at this time. Safety precautions in place. Will continue to monitor. Weekly labs drawn and sent.

## 2020-03-08 NOTE — Telephone Encounter
Spoke with pt regarding labs due to update transplant waitlist. PT stated she will complete lab prior to Para on Friday 04/23. PT has already been scheduled for Iron infusion on 03/15/2020. Update pt clinical coordinator. All pt questions and concerns answered.

## 2020-03-11 MED ORDER — ALBUMIN, HUMAN 25 % IV SOLP
0 refills | Status: CP
Start: 2020-03-11 — End: ?

## 2020-03-15 MED ORDER — IRON DEXTRAN IVPB
25 mg | Freq: Once | INTRAVENOUS | 0 refills | Status: CN
Start: 2020-03-15 — End: ?

## 2020-03-15 MED ORDER — DIPHENHYDRAMINE HCL 25 MG PO CAP
25 mg | Freq: Once | ORAL | 0 refills | Status: CP
Start: 2020-03-15 — End: ?
  Administered 2020-03-15: 15:00:00 25 mg via ORAL

## 2020-03-15 MED ORDER — ACETAMINOPHEN 500 MG PO TAB
500 mg | Freq: Once | ORAL | 0 refills | Status: CN
Start: 2020-03-15 — End: ?

## 2020-03-15 MED ORDER — DIPHENHYDRAMINE HCL 25 MG PO CAP
25 mg | Freq: Once | ORAL | 0 refills | Status: CN
Start: 2020-03-15 — End: ?

## 2020-03-15 MED ORDER — IRON DEXTRAN IVPB
25 mg | Freq: Once | INTRAVENOUS | 0 refills | Status: CP
Start: 2020-03-15 — End: ?
  Administered 2020-03-15 (×2): 25 mg via INTRAVENOUS

## 2020-03-15 MED ORDER — IRON DEXTRAN IVPB
975 mg | Freq: Once | INTRAVENOUS | 0 refills | Status: CN
Start: 2020-03-15 — End: ?

## 2020-03-15 MED ORDER — ACETAMINOPHEN 500 MG PO TAB
500 mg | Freq: Once | ORAL | 0 refills | Status: DC
Start: 2020-03-15 — End: 2020-03-15

## 2020-03-15 MED ORDER — IRON DEXTRAN IVPB
975 mg | Freq: Once | INTRAVENOUS | 0 refills | Status: CP
Start: 2020-03-15 — End: ?
  Administered 2020-03-15 (×2): 975 mg via INTRAVENOUS

## 2020-03-18 MED ORDER — ALBUMIN, HUMAN 25 % IV SOLP
0 refills | Status: CP
Start: 2020-03-18 — End: ?

## 2020-03-18 NOTE — Patient Education
Dear Ms. Kristin Moody,     Thank you for choosing [facility] Interventional Radiology for your procedure. Your appointment information is listed below:    Appointment Date: 03/25/2020  Appointment Time: 1:00 PM   Arrival Time: 12:00 PM  Location:        ? Main Campus: [address], [place], [place]  [address]  Parking: [address] Parking Garage      INTERVENTIONAL RADIOLOGY  PRE-PROCEDURE INSTRUCTIONS LOCAL    You are scheduled for a procedure in Interventional Radiology.  Please follow these instructions and any direction from your Primary Care/Managing Physician.  If you have questions about your procedure or need to reschedule please call [phone].      Medication Instructions:  Continue scheduled medication.       Diet Instructions: Maintain regular diet with no restrictions.     Day of Exam Instructions:  1. Bathe or shower with an antibacterial soap prior to your appointment.  2. Bring a list of your current medications and the dosages.  3. Wear comfortable clothing and leave valuables at home.  4. Arrive (1) hour prior to your appointment.  This time will be spent registering, interviewing, assessing, educating, and preparing you for the test.  ? You will be with [place] anywhere from 30 minutes to 2 hours after your exam depending on your procedure.  5. Depending on the procedure and as instructed by the nurse a responsible adult may be required to drive you home (no Benedetto Goad, taxis or buses are allowed). If a driver is required and you do not have one  we will be unable to perform your procedure.   6. The nurse will give you discharge instructions about your care and activities after the procedure.

## 2020-03-18 NOTE — Progress Notes
Interventional Radiology Outpatient Scheduling Checklist      1.  Name of Procedure(s):   Paracentesis      2.  Date of Procedure:   03/25/2020      3.  Arrival Time:   1200      4.  Procedure Time:  1300      5.  Correct Procedural Room Assignment:  Bell Room 7      6.  Blood Thinners Triaged and instructed per protocol: Y/N/NA:  NA  Confirmed accurate instructions sent to patient: Y/N:  NA       7.  Procedure Order Verified: Y/N:  Yes      9.  Patient instructed to have a driver: Y/N/NA:  Yes    10.  Patient instructed on NPO status: Y/N/NA:  NA  Confirmed accurate instructions sent to patient: Y/N:  Yes    11.  Specimen needed: Y/N/NA:  Yes   Verified Order placed: Y/N:  Yes    12.  Allergies Verified:  Y/N:  Yes    13.  Is there an Iodine Allergy: Y/N:  No  Does the Procedure Require contrast: Y/N:  NO  If so, was the IR- Contrast Allergy Pre-Procedure Medication protocol ordered: Y/NA:  NA    14.  Does the patient have labs according to IR Pre-procedure Laboratory Parameter policy: Y/N/NA:  Yes  If No, was the patient instructed to obtain labs prior to procedure: Y/N/NA:  NA     15.  Will the patient need to be admitted or have a possible admission: Y/N:  No  If yes, confirmed accurate instructions sent to patient: Y/N/NA:  NA     16.  Patient States Understanding: Y/N:  Yes    17.  History of OSA:  Y/N:  No  If yes, confirm request to bring CPAP sent to patient: Y/N/NA:  NA    18. Patient declines electronic procedure instructions: Y/N:  No; my chart

## 2020-03-21 ENCOUNTER — Encounter: Admit: 2020-03-21 | Discharge: 2020-03-21 | Payer: MEDICARE

## 2020-03-21 ENCOUNTER — Ambulatory Visit: Admit: 2020-03-21 | Discharge: 2020-03-22 | Payer: MEDICARE

## 2020-03-21 DIAGNOSIS — D649 Anemia, unspecified: Secondary | ICD-10-CM

## 2020-03-21 DIAGNOSIS — K31819 Angiodysplasia of stomach and duodenum without bleeding: Secondary | ICD-10-CM

## 2020-03-21 DIAGNOSIS — K760 Fatty (change of) liver, not elsewhere classified: Secondary | ICD-10-CM

## 2020-03-21 LAB — CBC AND DIFF
Lab: 0.1 10*3/uL (ref 0–0.20)
Lab: 0.5 10*3/uL — ABNORMAL HIGH (ref 0–0.45)
Lab: 1 % (ref 0–2)
Lab: 1.1 10*3/uL (ref 1.0–4.8)
Lab: 1.1 10*3/uL — ABNORMAL HIGH (ref 0–0.80)
Lab: 12 % — ABNORMAL LOW (ref 24–44)
Lab: 13 % — ABNORMAL HIGH (ref 4–12)
Lab: 2.4 M/UL — ABNORMAL LOW (ref 4.0–5.0)
Lab: 22 % — ABNORMAL LOW (ref 36–45)
Lab: 251 10*3/uL (ref 150–400)
Lab: 30 % — ABNORMAL HIGH (ref 11–15)
Lab: 30 pg (ref 26–34)
Lab: 32 g/dL (ref 32.0–36.0)
Lab: 5 % (ref 0–5)
Lab: 6.4 10*3/uL (ref 1.8–7.0)
Lab: 69 % (ref 41–77)
Lab: 7.3 g/dL — ABNORMAL LOW (ref 12.0–15.0)
Lab: 8.6 FL (ref 7–11)
Lab: 9.4 10*3/uL (ref 4.5–11.0)
Lab: 93 FL (ref 80–100)

## 2020-03-21 NOTE — Telephone Encounter
spoke with Jillian in blood transfusion center. Patient will arrive today at 12pm for 1 unit. Labs to be drawn in infusion center.

## 2020-03-21 NOTE — Patient Instructions
[facility]  OUTPATIENT POST TRANSFUSION INSTRUCTIONS     During your transfusion you were monitored by nursing staff for signs of a transfusion reaction, however, you should continue to observe for the following symptoms after you have been dismissed from the clinic:     FEVER OF 100.5 OR HIGHER  CHILLS  NAUSEA OR VOMITING  FEELING FAINT OR DIZZY  SHORTNESS OF BREATH  DARK OR RED COLORED URINE  CHEST OR BACK PAIN  SHOCK OR LOSS OF CONSCIOUSNESS  YELLOWING OF THE EYES OR SKIN     If you or your caregiver notice any of these symptoms, contact your ordering physician immediately and go to the nearest Emergency Department for treatment. When you arrive at the Emergency Department notify them that you recently received a blood transfusion. *For more detailed signs and symptoms of a transfusion reaction, please refer to the [place] education information below.          When You Need a Blood Transfusion (Adult)  A blood transfusion may be done when you have lost blood because of an injury or during surgery. It can also be done because of diseases or conditions that affect the blood. Blood is made up of several different parts (blood products). You may receive some or all of these blood products during a transfusion. Blood for transfusion is usually donated from another person (donor). Strict measures are taken to make sure that donated blood is safe before it's given to you. This sheet helps you understand how a blood transfusion is done. Your healthcare provider will discuss your condition with you and answer your questions.   The parts of blood  Blood can be broken down into different parts that perform special roles in the body. These parts include:  ? Red blood cells, which carry oxygen throughout the body.  ? Platelets, which help stop bleeding.  ? Plasma (the liquid part of blood), which carries red blood cells and platelets throughout the body. Plasma also helps platelets in stopping bleeding. Where does donated blood come from?  ? Volunteer donors. These are people who donate their blood to help others in need of blood. Blood donation can take place at several places, including a hospital, blood bank, or during a blood drive.  ? Directed donation. If you need a blood transfusion during a planned surgery, family and friends can have their blood tested for compatibility and donate blood for you before the surgery. This needs to be done at least 7 day(s) in advance. This is because the blood must be tested for safety.  ? Autologous donation. This is also called self-donation. For planned surgery, you can donate your own blood starting up to 6 weeks before surgery.  Are blood transfusions safe?  Donated blood is tested and processed to make sure that the blood is safe:  ? The health and medical history of each donor is carefully screened. If a person is considered high-risk for infection or problems, he or she isn't accepted as a blood donor.  ? Donated blood is tested for infections such as hepatitis, syphilis, and HIV (the virus that causes AIDS). If the tested blood is found to be unsafe, it's destroyed.  ? Blood is divided into four types: A, B, AB, and O. Blood also has Rh types: positive (+) and negative (-). You can only receive blood products that are compatible with (match) your blood type. A sample of your blood is tested for compatibility with donated blood. This is  done before blood products are prepared for a transfusion.  How is a blood transfusion done?  A blood transfusion takes place in a blood center, infusion center, hospital room, or operating room. Your healthcare provider will discuss the blood transfusion with you before it's done. You'll need to give permission for the blood transfusion by signing a consent form.  ? Two healthcare providers confirm your identity. They also confirm that they have the correct blood product(s) for you.  ? An intravenous (IV) line is placed in a vein if you do not already have an IV.  ? The blood product comes in a plastic bag that is hung on an IV pole. The blood product flows from the bag into your IV line. The IV line may be connected to a pump, which controls the transfusion rate. You may receive more than one kind of blood product through the IV.  ? Your vital signs (blood pressure, heart rate, respiratory rate, and temperature) are checked throughout the transfusion. This is to make sure you are not having a reaction to the blood product.  ? The IV line may be removed once the transfusion is complete.  Possible risks and complications of blood transfusions  Most transfusions are problem free. In some cases, reactions occur. These can happen within seconds to minutes during the transfusion or a week to a few months after the transfusion. Call your doctor or nurse right away if you have any of the signs or symptoms in the table below during or after a transfusion:  Reaction Timing Symptoms   Allergic reaction (mild) ? Within seconds to minutes during the transfusion  ? Up to 24 hours after the transfusion Hives or red welts on the skin, mild itching, rash, localized swelling, flushing (red face), wheezing, shortness of breath, or stridor (high-pitched noise or sound)   Anaphylactic reaction ? Within seconds to minutes during the transfusion  ? Up to 24 hours after the transfusion Shortness of breath, flushing (red face), wheezing, labored (working hard) breathing, low blood pressure, localized swelling, chest tightness, or cramps   Febrile nonhemolytic reaction ? Within minutes to hours during the transfusion  ? Within a few hours to 24 hours after the transfusion Fever (increase of 1? C or higher), chills, flushing (red face), nausea, headache, minor discomfort, or mild shortness of breath   Acute immune hemolytic reaction ? Within minutes during the transfusion  ? Up to 24 hours after the transfusion Fever, red or brown urine, back pain, fast heart rate (tachycardia), abdominal pain, low blood pressure, feeling anxious, chills, chest pain, nausea, or fainting spells   Transfusion-related acute lung injury (TRALI) ? Within 1 to 2 hours during the transfusion  ? Up to 6 hours after the transfusion Shortness of breath, trouble breathing, low blood pressure, fever, pulmonary edema   Transfusion-associated circulatory overload ? Near the end of the transfusion  ? Within 6 hours after the transfusion Shortness of breath, fast heart rate (tachycardia), problems breathing when lying on back, abnormal blood pressure   Post-transfusion purpura (PUP) ? Within 1 week  ? Up to 48 days after the transfusion Purple spots on skin; nose bleed; bleeding from the urinary tract, abdomen, colon, or rectum; fever; or chills     Delayed transfusion-related acute lung injury (TRALI) ? Within 72 hours (3 days) after the transfusion Sudden onset of respiratory distress or trouble breathing   Delayed hemolytic reaction ? Within 3 to 7 days  ? Up to weeks after the  transfusion Low-grade fever, mild jaundice (yellowing of the skin and whites of the eyes), decrease in hematocrit, chills, chest pain, back pain, nausea   ? 2000-2019 [employer], [place]. [address] [place], [place] [address]. All rights reserved. This information is not intended as a substitute for professional medical care. Always follow your healthcare professional's instructions.

## 2020-03-21 NOTE — Telephone Encounter
labs reviewed from 03/18/2020, hgb 6.8. blood transfusion ordered, staff message sent to blood transfufion center for scheduling

## 2020-03-22 DIAGNOSIS — K760 Fatty (change of) liver, not elsewhere classified: Secondary | ICD-10-CM

## 2020-03-24 ENCOUNTER — Encounter: Admit: 2020-03-24 | Discharge: 2020-03-24 | Payer: MEDICARE

## 2020-03-24 NOTE — Telephone Encounter
Spoke to patient. Has MyChart and Zoom downloaded. Patient understands to test Zoom video and audio on chosen device. Pt has pop up blockers disabled on their device. Pt. was able to log into MyChart to verify they have access. Patient instructed to read the Telehealth consents in MyChart.  Notified patient that the clinic staff will call 30min-1hr prior to visit. Pt. Reports that they are able to get vitals prior to the visit.  Pt. had no questions at this time.

## 2020-03-25 ENCOUNTER — Ambulatory Visit: Admit: 2020-03-25 | Discharge: 2020-03-25 | Payer: MEDICARE

## 2020-03-25 ENCOUNTER — Encounter: Admit: 2020-03-25 | Discharge: 2020-03-25 | Payer: MEDICARE

## 2020-03-25 ENCOUNTER — Ambulatory Visit: Admit: 2020-03-25 | Discharge: 2020-03-26 | Payer: MEDICARE

## 2020-03-25 DIAGNOSIS — E119 Type 2 diabetes mellitus without complications: Secondary | ICD-10-CM

## 2020-03-25 DIAGNOSIS — K7469 Other cirrhosis of liver: Secondary | ICD-10-CM

## 2020-03-25 DIAGNOSIS — R188 Other ascites: Secondary | ICD-10-CM

## 2020-03-25 DIAGNOSIS — K7290 Hepatic failure, unspecified without coma: Secondary | ICD-10-CM

## 2020-03-25 DIAGNOSIS — I10 Essential (primary) hypertension: Secondary | ICD-10-CM

## 2020-03-25 DIAGNOSIS — R0600 Dyspnea, unspecified: Secondary | ICD-10-CM

## 2020-03-25 DIAGNOSIS — K760 Fatty (change of) liver, not elsewhere classified: Secondary | ICD-10-CM

## 2020-03-25 DIAGNOSIS — K7030 Alcoholic cirrhosis of liver without ascites: Secondary | ICD-10-CM

## 2020-03-25 DIAGNOSIS — R945 Abnormal results of liver function studies: Secondary | ICD-10-CM

## 2020-03-25 DIAGNOSIS — D500 Iron deficiency anemia secondary to blood loss (chronic): Secondary | ICD-10-CM

## 2020-03-25 DIAGNOSIS — K7460 Unspecified cirrhosis of liver: Secondary | ICD-10-CM

## 2020-03-25 DIAGNOSIS — K5730 Diverticulosis of large intestine without perforation or abscess without bleeding: Secondary | ICD-10-CM

## 2020-03-25 DIAGNOSIS — I729 Aneurysm of unspecified site: Secondary | ICD-10-CM

## 2020-03-25 DIAGNOSIS — K7581 Nonalcoholic steatohepatitis (NASH): Secondary | ICD-10-CM

## 2020-03-25 DIAGNOSIS — J329 Chronic sinusitis, unspecified: Secondary | ICD-10-CM

## 2020-03-25 DIAGNOSIS — Z01810 Encounter for preprocedural cardiovascular examination: Secondary | ICD-10-CM

## 2020-03-25 DIAGNOSIS — K31819 Angiodysplasia of stomach and duodenum without bleeding: Secondary | ICD-10-CM

## 2020-03-25 DIAGNOSIS — E669 Obesity, unspecified: Secondary | ICD-10-CM

## 2020-03-25 DIAGNOSIS — Z713 Dietary counseling and surveillance: Secondary | ICD-10-CM

## 2020-03-25 LAB — CBC AND DIFF
Lab: 180 10*3/uL (ref 150–400)
Lab: 2.7 M/UL — ABNORMAL LOW (ref 4.0–5.0)
Lab: 24 % — ABNORMAL LOW (ref 36–45)
Lab: 29 pg (ref 26–34)
Lab: 32 g/dL (ref 32.0–36.0)
Lab: 8.1 g/dL — ABNORMAL LOW (ref 12.0–15.0)
Lab: 8.2 10*3/uL (ref 4.5–11.0)
Lab: 8.4 FL (ref 7–11)
Lab: 91 FL (ref 80–100)

## 2020-03-25 LAB — COMPREHENSIVE METABOLIC PANEL
Lab: 1 mg/dL — ABNORMAL HIGH (ref 0.4–1.00)
Lab: 1.9 mg/dL — ABNORMAL HIGH (ref 0.3–1.2)
Lab: 106 MMOL/L (ref 98–110)
Lab: 121 mg/dL — ABNORMAL HIGH (ref 70–100)
Lab: 131 MMOL/L — ABNORMAL LOW (ref 137–147)
Lab: 160 U/L — ABNORMAL HIGH (ref 25–110)
Lab: 19 MMOL/L — ABNORMAL LOW (ref 21–30)
Lab: 2.8 g/dL — ABNORMAL LOW (ref 3.5–5.0)
Lab: 20 U/L (ref 7–56)
Lab: 27 mg/dL — ABNORMAL HIGH (ref 7–25)
Lab: 4.2 MMOL/L (ref 3.5–5.1)
Lab: 46 U/L — ABNORMAL HIGH (ref 7–40)
Lab: 5.6 g/dL — ABNORMAL LOW (ref 6.0–8.0)
Lab: 55 mL/min — ABNORMAL LOW (ref >60)
Lab: 6 (ref 3–12)
Lab: 8 mg/dL — ABNORMAL LOW (ref 8.5–10.6)
Lab: >60 mL/min (ref >60)

## 2020-03-25 LAB — GRAM STAIN

## 2020-03-25 LAB — POC GLUCOSE: Lab: 127 mg/dL — ABNORMAL HIGH (ref 70–100)

## 2020-03-25 LAB — PROTIME INR (PT): Lab: 1.4 % — ABNORMAL HIGH (ref 0.8–1.2)

## 2020-03-25 MED ORDER — ALBUMIN, HUMAN 25 % IV SOLP
0 refills | Status: CP
Start: 2020-03-25 — End: ?

## 2020-03-25 NOTE — Progress Notes
labs from today noted increased MELD.   Ascites despite diuretics evident by para needed.   MELD updated on the transplant waitlist.   New MELD is 18.  Due for recert again on 06/23/2020

## 2020-03-25 NOTE — Patient Education
Dear Kristin Moody,    Thank you for choosing [facility] Interventional Radiology for your procedure. Your appointment information is listed below:  Appointment Date: 04/01/20  Appointment Time: 2:00pm  Arrival Time: 1:00pm    Location:  Main Campus: [address], [place], [place]  [address]  Parking: [address] Parking Garage/ Please go to main entrance of hospital and go to second floor to Interventional Radiology    INTERVENTIONAL RADIOLOGY  PRE-PROCEDURE INSTRUCTIONS    You are scheduled for a procedure in Interventional Radiology with procedural sedation.  Please follow these instructions and any direction from your Primary Care/Managing Physician.  If you have questions about your procedure or need to reschedule please call [phone].    Medication Instructions:   You may take the following medications with a small sip of water:  Continue your regular medications as directed       Diet Instructions: No dietary restrictions       Day of Exam Instructions:  1. Bathe or shower with an antibacterial soap prior to your appointment.  2. Bring a list of your current medications and the dosages.  3. Wear comfortable clothing and leave valuables at home.  4. Arrive 1 hour prior to your appointment.  This time will be spent registering, interviewing, assessing, educating and preparing you for the test.  ? You will be with [place] anywhere from 30 minutes to 6 hours after your exam depending on your procedure.    Interventional Radiology Team  Perioperative and Procedural Scheduling Department  [facility]  Ph: [phone]

## 2020-03-25 NOTE — H&P (View-Only)
IR Pre-Procedure History and Physical/Sedation Plan    Procedure Date: 03/25/2020     Planned Procedure(s):  Paracentesis     Procedural code status: Prior    Indication:  Ascites  __________________________________________________________________    Chief Complaint:  Ascites    History of Present Illness: Kristin Moody is a 64 y.o. female with a history as listed below who presents today for procedure.    Patient Active Problem List    Diagnosis Date Noted   ? 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 Hypotension 02/10/2020   ? 'Light-for-dates' infant with signs of fetal malnutrition 01/22/2020   ? 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 Anemia 09/28/2019   ? Encephalopathy 08/12/2019   ? Spasticity 08/10/2019   ? Hyperreflexia 08/10/2019   ? 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 Right leg weakness 05/14/2019   ? 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 Iron (Fe) deficiency anemia 01/19/2019   ? Preop cardiovascular exam 01/07/2019   ? Pre-transplant evaluation for liver transplant 01/06/2019   ? Cirrhosis (HCC) 12/16/2018   ? Acute on chronic anemia 12/16/2018   ? Lactic acidosis 12/16/2018   ? GI bleed 10/24/2018   ? Portal hypertension (HCC) 08/28/2018   ? Confusion 08/27/2018   ? Dyspnea 08/27/2018   ? Chronic abdominal pain 08/27/2018   ? 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 Acute on chronic blood loss anemia 08/27/2018   ? Iron deficiency anemia 04/13/2018   ? Pneumonia due to infectious organism 04/13/2018   ? Melena 04/10/2018   ? Esophageal varices without bleeding (HCC) 02/25/2018   ? Cirrhosis of liver without ascites (HCC) 02/25/2018   ? 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 Lower abdominal pain 10/24/2014   ? Chest discomfort 09/21/2014   ? Essential hypertension 08/13/2012   ? Abnormal liver function tests 05/13/2011   ? Fatty liver disease, nonalcoholic 05/13/2011   ? Obesity (BMI 30-39.9) 05/13/2011 ? Slow transit constipation 05/13/2011     Medical History:   Diagnosis Date   ? Aneurysm (HCC)    ? 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 Diverticulosis of colon     descending and sigmoid colon   ? Dyspnea    ? Essential hypertension 08/13/2012   ? Fatty infiltration of liver    ? HTN (hypertension)    ? Obesity (BMI 30-39.9) 05/13/2011   ? Preop cardiovascular exam 01/07/2019   ? Sinus infection    ? Type 2 diabetes mellitus (HCC) 09/21/2014      Surgical History:   Procedure Laterality Date   ? HYSTERECTOMY  1994   ? UPPER GASTROINTESTINAL ENDOSCOPY  2009   ? COLONOSCOPY  2009   ? CYSTOCELE REPAIR  2009    with endocele repair   ? RECTOCELE REPAIR  03/2009   ? LIVER BIOPSY  07/17/2010   ? COLONOSCOPY N/A 11/08/2017    Performed by Dawna Part, MD at [facility] ENDO   ? ESOPHAGOGASTRODUODENOSCOPY N/A 11/08/2017    Performed by Dawna Part, MD at [facility] ENDO   ? ESOPHAGOGASTRODUODENOSCOPY WITH BIOPSY - FLEXIBLE  11/08/2017    Performed by Dawna Part, MD at [facility] ENDO   ? COLONOSCOPY WITH HOT BIOPSY FORCEPS REMOVAL TUMOR/ POLYP/ OTHER LESION  11/08/2017    Performed by Dawna Part, MD at [facility] ENDO   ? ESOPHAGOGASTRODUODENOSCOPY WITH BAND LIGATION ESOPHAGEAL/  GASTRIC VARICES - FLEXIBLE N/A 04/10/2018    Performed by Normajean Baxter, MD at [facility] ENDO   ? ESOPHAGOGASTRODUODENOSCOPY WITH BIOPSY - FLEXIBLE N/A 04/10/2018    Performed by Normajean Baxter, MD at [facility] ENDO   ? ESOPHAGOGASTRODUODENOSCOPY WITH CONTROL OF BLEEDING - FLEXIBLE N/A 04/25/2018    Performed by Jolee Ewing, MD at [facility] ENDO   ? ESOPHAGOGASTRODUODENOSCOPY WITH BIOPSY - FLEXIBLE with push enteroscopy N/A 08/28/2018    Performed by Celesta Gentile, MD at [facility] ENDO   ? EGD N/A 10/27/2018    Performed by Eliott Nine, MD at [facility] ENDO   ? ESOPHAGOGASTRODUODENOSCOPY WITH SNARE REMOVAL TUMOR/ POLYP/ OTHER LESION - FLEXIBLE N/A 10/27/2018    Performed by Eliott Nine, MD at [facility] ENDO   ? ESOPHAGOGASTRODUODENOSCOPY WITH BIOPSY - FLEXIBLE N/A 12/16/2018 Performed by Buckles, Vinnie Level, MD at [facility] ENDO   ? ESOPHAGOGASTRODUODENOSCOPY WITH CONTROL OF BLEEDING - FLEXIBLE N/A 12/16/2018    Performed by Buckles, Vinnie Level, MD at [facility] ENDO   ? ESOPHAGOGASTRODUODENOSCOPY WITH SPECIMEN COLLECTION BY BRUSHING/ WASHING N/A 01/22/2019    Performed by Veneta Penton, MD at [facility] ENDO   ? ESOPHAGOGASTRODUODENOSCOPY WITH DILATION ESOPHAGUS WITH BALLOON 30 MM OR GREATER - FLEXIBLE N/A 01/22/2019    Performed by Veneta Penton, MD at [facility] ENDO   ? ESOPHAGOGASTRODUODENOSCOPY WITH BIOPSY - FLEXIBLE N/A 01/22/2019    Performed by Veneta Penton, MD at [facility] ENDO   ? ESOPHAGOGASTRODUODENOSCOPY WITH BIOPSY - FLEXIBLE N/A 06/19/2019    Performed by Dawna Part, MD at [facility] ENDO   ? ESOPHAGOGASTRODUODENOSCOPY [WITH APC]  WITH SPECIMEN COLLECTION BY BRUSHING/ WASHING N/A 08/07/2019    Performed by Dawna Part, MD at [facility] ENDO   ? ESOPHAGOGASTRODUODENOSCOPY WITH SPECIMEN COLLECTION BY BRUSHING/ WASHING N/A 09/10/2019    Performed by Buckles, Vinnie Level, MD at [facility] ENDO   ? ESOPHAGOGASTRODUODENOSCOPY WITH CONTROL OF BLEEDING - FLEXIBLE N/A 09/10/2019    Performed by Buckles, Vinnie Level, MD at [facility] ENDO   ? ESOPHAGOGASTRODUODENOSCOPY WITH CONTROL OF BLEEDING - FLEXIBLE N/A 02/12/2020    Performed by Lenor Derrick, MD at [facility] ENDO   ? ESOPHAGOGASTRODUODENOSCOPY WITH BIOPSY - FLEXIBLE N/A 02/26/2020    Performed by Buckles, Vinnie Level, MD at [facility] ENDO   ? CHOLECYSTECTOMY     ? COLONOSCOPY     ? ESOPHAGOGASTRIC FUNDOPLICATION  2003, 2004    laparoscopic   ? ESOPHAGOGASTRIC FUNDOPLICATION     ? HX HYSTERECTOMY     ? PR ESOPHAGOSCOPY FLEXIBLE TRANSORAL DIAGNOSTIC        Social History     Tobacco Use   ? Smoking status: Never Smoker   ? Smokeless tobacco: Never Used   Substance Use Topics   ? Alcohol use: No      Family History   Problem Relation Age of Onset   ? Other Mother    ? Kidney Failure Mother    ? Osteoporosis Mother    ? Cancer Father         esophageal   ? Liver Disease Sister         endstage, s/p liver transplant   ? Cancer Brother         tonsil, liver    ? Hip Fracture Neg Hx       Medications Prior to Admission   Medication Sig Dispense Refill Last Dose   ? cetirizine (ZYRTEC) 10 mg tablet Take 10 mg by mouth daily as needed for Allergy symptoms.  03/25/2020   ? ciprofloxacin (CIPRO) 500 mg tablet Take one tablet by mouth daily. 30 tablet 1 03/25/2020   ? duloxetine DR (CYMBALTA) 60 mg capsule Take 60 mg by mouth daily.   03/25/2020   ? midodrine (PROAMITINE) 10 mg tablet Take one tablet by mouth three times daily. 90 tablet 1 03/25/2020   ? pantoprazole DR (PROTONIX) 40 mg tablet Take one tablet by mouth twice daily. 60 tablet 3 03/25/2020   ? polyethylene glycol 3350 (MIRALAX) 17 g packet Take one packet by mouth twice daily.   03/24/2020   ? promethazine (PHENERGAN) 25 mg tablet Take 25 mg by mouth every 4 hours as needed. Just uses it with Tramadol   03/25/2020   ? rifAXIMin (XIFAXAN) 550 mg tablet Take one tablet by mouth twice daily. 180 tablet 3 03/25/2020   ? zinc sulfate 220 mg (50 mg elemental zinc) capsule Take one capsule by mouth daily. 90 capsule 3 03/25/2020     Allergies   Allergen Reactions   ? Tegaderm BLISTERS   ? Adhesive Tape (Rosins) RASH   ? Sulfa (Sulfonamide Antibiotics) RASH   ? Codeine NAUSEA AND VOMITING   ? Morphine SEE COMMENTS     Pt reports burns her veins   ? Penicillin G SEE COMMENTS     Throat issues, blisters in throat    ? Tramadol NAUSEA ONLY and ITCHING     Takes this medication regularly        Review of Systems  A comprehensive review of systems was negative.    Previous Personal Anesthetic/Sedation History:  Denies adverse events related to sedation/anesthesia.     Previous Family Anesthetic/Sedation History: Denies adverse events related to sedation/anesthesia.    Physical Exam:  Vital Signs: Last Filed In 24 Hours Vital Signs: 24 Hour Range                General appearance: Alert and no distress noted.  Neurologic: Grossly normal.  Lungs: Non labored at rest.  Heart: Regular rate and rhythm  Abdomen: Distended    Airway: airway assessment performed  Mallampati II (soft palate, uvula, fauces visible)  Head and Neck: no abnormalities noted  Mouth: no abnormalities noted  NPO status: Acceptable  Pregnancy Status: Not Pregnant  Anesthesia Classification:  ASA III (A patient with a severe systemic disease that limits activity, but is not incapacitating)  Pre-operative anxiolysis Plan: N/A  Sedation/Medication Plan: Lidocaine  Discussion/Reviews:  Physician has discussed risks and alternatives of this type of sedation and above planned procedures with patient    Lab/Radiology/Other Diagnostic Tests:  Labs:  Pertinent labs reviewed           Pollyann Kennedy, APRN-NP  Pager [phone]

## 2020-03-25 NOTE — Patient Instructions
PARACENTESIS   A paracentesis is the removal of an abnormal buildup of fluid in your abdominal cavity. This fluid buildup is called ascites and may be caused by conditions such as liver disease, heart failure, or cancer. During this procedure, a needle is inserted into your abdomen to drain the fluid. The fluid may then be sent to the lab for testing if medically indicated. Removal of the fluid may also relieve belly pressure and shortness of breath caused by the ascites.?  POST-PROCEDURE PAIN:   ? Pain control following your procedure is a priority for both you and your Physicians.  ? Some soreness or tenderness at the site is to be expected for several days. We recommend taking over the counter analgesics to help relieve this pain.  ? Alternative methods for pain relief include but not limited to heat or cold compress, relaxation techniques, rest, and changing of positions.  ? If pain continues after 5-7 days or you have severe pain not relieved by medication, please contact us as directed below.?  POST-PROCEDURE ACTIVITY:   ? A responsible adult must drive you home.  ? If you receive sedation, narcotic pain medication or anesthesia for the procedure, you should not drive or operate heavy machinery or do anything that requires concentration for at least 24 hours after procedure completion.  ? It is recommended that a responsible adult be with you until morning.?  POST-PROCEDURE SITE CARE:   ? You will have a small bandage over the procedure site. Keep this dry.  ? You may remove it in 24 hours.  ? You may shower in 24 hours, after removing the bandage.  ? A dry gauze bandage may be reapplied as necessary to protect your clothing as the site may sometimes leak for several days after the procedure.  ? Do not submerge the procedure site for 1 week (no bathtub, swimming, hot tub, etc.)  ? Do not use ointments, creams or powders on the puncture site.  ? Be sure your hands are clean when touching near the site.? DIET/MEDICATIONS:   ? You may resume your previous diet after the procedure.  ? If you receive sedation or narcotic pain medications, avoid any foods or beverages containing alcohol for at least 24 hours after the procedure.  ? Please see the Medication Reconciliation sheet for instructions regarding resuming your home medications.?  CALL THE DOCTOR IF:   ? Bright red blood soaks the bandage.  ? You have pain not relieved by medication. Some soreness at the site is to be expected.  ? You have signs of infection such as: fever greater than 101F, chills, redness, warmth, swelling, drainage or pus from the puncture site.  For any of the above symptoms or for problems or concerns related to the procedure performed at the [place] Location, call [phone] Monday-Friday from 7-5p. After-hours and weekends, please call [phone] and ask for the Interventional Radiology Resident on-call.   You or your caregiver should call 911 for any severe symptoms such as excessive bleeding, severe dizziness, trouble breathing or loss of consciousness.   ?  ?

## 2020-03-25 NOTE — Procedures
Immediate Post Procedure Note    Date:  03/25/2020                                      Attending Physician:   Thomasena Edis  Assistant(s):  None      Procedure(s):  Paracentesis    Preprocedure/postprocedure diagnosis:  Abdominal Ascites  Description of procedure and procedural findings: Clear yellow ascites aspirated from the abdomen.  Total volumes pending, please refer to separate dictation.   Anesthesia: Local 10 mL 1% lidocaine without epinephrine  Sedation/Medication Plan: Other    Time out performed: Consent obtained, correct patient verified, correct procedure verified, correct site verified, patient marked as necessary.  Estimated Blood Loss:  None/Negligible  Specimen(s) Removed/Disposition:  None  Complications: None      Maricela Curet, MD

## 2020-03-25 NOTE — Progress Notes
Interventional Radiology Outpatient Scheduling Checklist      1.  Name of Procedure(s):  Paracentesis       2.  Date of Procedure:   04/01/20      3.  Arrival Time:  1300       4.  Procedure Time:  1400      5.  Correct Procedural Room Assignment:  IR Bh rm 6      6.  Blood Thinners Triaged and instructed per protocol: Y/N/NA:  NA  Confirmed accurate instructions sent to patient: Y/N:  NA       7.  Procedure Order Verified: Y/N:  Yes      9.  Patient instructed to have a driver: Y/N/NA:  Yes    10.  Patient instructed on NPO status: Y/N/NA:  No dietary restricitons  Confirmed accurate instructions sent to patient: Y/N:  Yes    11.  Specimen needed: Y/N/NA:  Yes   Verified Order placed: Y/N:  Yes    12.  Allergies Verified:  Y/N:  Yes    13.  Is there an Iodine Allergy: Y/N:  No  Does the Procedure Require contrast: Y/N:  No   If so, was the IR- Contrast Allergy Pre-Procedure Medication protocol ordered: Y/NA:  NA    14.  Does the patient have labs according to IR Pre-procedure Laboratory Parameter policy: Y/N/NA:  Yes  If No, was the patient instructed to obtain labs prior to procedure: Y/N/NA:  NA     15.  Will the patient need to be admitted or have a possible admission: Y/N:  No  If yes, confirmed accurate instructions sent to patient: Y/N/NA:  NA     16.  Patient States Understanding: Y/N:  Yes    17.  History of OSA:  Y/N:  No  If yes, confirm request to bring CPAP sent to patient: Y/N/NA:  NA    18. Patient declines electronic procedure instructions: Y/N:  No

## 2020-03-25 NOTE — Progress Notes
Pt meets criteria for discharge home from IR.  Neuro intact.  Pain free.  VSS.  Afebrile.  SR per monitor.  Room air.  Lungs clear.  Tolerating po.  Right lat abd drsg CDI.  PIV dc'd.  Discharge instructions reviewed.  Pt assisted with dressing.    1505 Pt escorted to main entrance via wheelchair.  Discharged home with husband via private vehicle.

## 2020-03-27 ENCOUNTER — Encounter: Admit: 2020-03-27 | Discharge: 2020-03-27 | Payer: MEDICARE

## 2020-03-27 DIAGNOSIS — K7460 Unspecified cirrhosis of liver: Secondary | ICD-10-CM

## 2020-03-27 DIAGNOSIS — Z01810 Encounter for preprocedural cardiovascular examination: Secondary | ICD-10-CM

## 2020-03-27 DIAGNOSIS — E669 Obesity, unspecified: Secondary | ICD-10-CM

## 2020-03-27 DIAGNOSIS — K5730 Diverticulosis of large intestine without perforation or abscess without bleeding: Secondary | ICD-10-CM

## 2020-03-27 DIAGNOSIS — E119 Type 2 diabetes mellitus without complications: Secondary | ICD-10-CM

## 2020-03-27 DIAGNOSIS — R0600 Dyspnea, unspecified: Secondary | ICD-10-CM

## 2020-03-27 DIAGNOSIS — K760 Fatty (change of) liver, not elsewhere classified: Secondary | ICD-10-CM

## 2020-03-27 DIAGNOSIS — I729 Aneurysm of unspecified site: Secondary | ICD-10-CM

## 2020-03-27 DIAGNOSIS — I10 Essential (primary) hypertension: Secondary | ICD-10-CM

## 2020-03-27 DIAGNOSIS — J329 Chronic sinusitis, unspecified: Secondary | ICD-10-CM

## 2020-04-01 ENCOUNTER — Encounter: Admit: 2020-04-01 | Discharge: 2020-04-01 | Payer: MEDICARE

## 2020-04-01 ENCOUNTER — Ambulatory Visit: Admit: 2020-04-01 | Discharge: 2020-04-01 | Payer: MEDICARE

## 2020-04-01 DIAGNOSIS — D500 Iron deficiency anemia secondary to blood loss (chronic): Secondary | ICD-10-CM

## 2020-04-01 DIAGNOSIS — K31819 Angiodysplasia of stomach and duodenum without bleeding: Secondary | ICD-10-CM

## 2020-04-01 DIAGNOSIS — R945 Abnormal results of liver function studies: Secondary | ICD-10-CM

## 2020-04-01 DIAGNOSIS — K7469 Other cirrhosis of liver: Secondary | ICD-10-CM

## 2020-04-01 DIAGNOSIS — K760 Fatty (change of) liver, not elsewhere classified: Principal | ICD-10-CM

## 2020-04-01 LAB — CBC AND DIFF
Lab: 0.7 10*3/uL — ABNORMAL LOW (ref 1.0–4.8)
Lab: 1 % (ref 0–2)
Lab: 12 % — ABNORMAL LOW (ref 24–44)
Lab: 15 % — ABNORMAL HIGH (ref 4–12)
Lab: 2.6 M/UL — ABNORMAL LOW (ref 4.0–5.0)
Lab: 21 % — ABNORMAL HIGH (ref 11–15)
Lab: 212 10*3/uL (ref 150–400)
Lab: 22 % — ABNORMAL LOW (ref 36–45)
Lab: 28 pg (ref 26–34)
Lab: 32 g/dL (ref 32.0–36.0)
Lab: 4 % (ref 0–5)
Lab: 4.4 10*3/uL (ref 1.8–7.0)
Lab: 6.5 10*3/uL (ref >60)
Lab: 68 % (ref 41–77)
Lab: 7.4 g/dL — ABNORMAL LOW (ref 12.0–15.0)
Lab: 8.5 FL (ref 7–11)
Lab: 87 FL (ref 80–100)

## 2020-04-01 LAB — COMPREHENSIVE METABOLIC PANEL
Lab: 1 mg/dL — ABNORMAL HIGH (ref 0.4–1.00)
Lab: 1.2 mg/dL (ref 0.3–1.2)
Lab: 104 MMOL/L (ref 98–110)
Lab: 111 mg/dL — ABNORMAL HIGH (ref 70–100)
Lab: 133 MMOL/L — ABNORMAL LOW (ref 137–147)
Lab: 169 U/L — ABNORMAL HIGH (ref 25–110)
Lab: 19 U/L (ref 7–56)
Lab: 2.7 g/dL — ABNORMAL LOW (ref 3.5–5.0)
Lab: 21 MMOL/L (ref 21–30)
Lab: 30 mg/dL — ABNORMAL HIGH (ref 7–25)
Lab: 4.3 MMOL/L (ref 3.5–5.1)
Lab: 47 U/L — ABNORMAL HIGH (ref 7–40)
Lab: 5.4 g/dL — ABNORMAL LOW (ref 6.0–8.0)
Lab: 51 mL/min — ABNORMAL LOW (ref >60)
Lab: 8 mg/dL — ABNORMAL LOW (ref 8.5–10.6)
Lab: >60 mL/min (ref >60)
Lab: 8 (ref 3–12)

## 2020-04-01 LAB — PROTIME INR (PT): Lab: 1.5 — ABNORMAL HIGH (ref 0.8–1.2)

## 2020-04-01 MED ORDER — ALBUMIN, HUMAN 25 % IV SOLP
0 refills | Status: CP
Start: 2020-04-01 — End: ?

## 2020-04-01 NOTE — Other
Immediate Post Procedure Note    Date:  04/01/2020                                         Attending Physician:   Yaakov Guthrie, MD  Performing Provider:  Ellsworth Lennox, DO    Consent:  Consent obtained from patient.  Time out performed: Consent obtained, correct patient verified, correct procedure verified, correct site verified, patient marked as necessary.  Pre/Post Procedure Diagnosis:  Cirrhosis of liver with ascites   Indications:  ascites    Anesthesia: Local 6 mL 1% lidocaine without epinephrine  Procedure(s):  Paracentesis:    Under sterile conditions the skin of the RLQ was prepped and covered with a sterile drape.  Local anesthesia was applied to the skin and subcutaneous tissues and a positive aspiration of ascites confirmed.  A 5 French paracentesis needle was introduced at the prepared site and advanced until fluid was attained, and the introducer needle withdrawn. Total volumes pending. See radiology report.     Findings:  Successful paracentesis     Estimated Blood Loss:  None/Negligible  Specimen(s) Removed/Disposition:  Yes, sent to lab  Complications: None  Patient Tolerated Procedure: Well  Post-Procedure Condition:  stable    Ellsworth Lennox, DO

## 2020-04-01 NOTE — H&P (View-Only)
IR Pre-Procedure History and Physical/Sedation Plan    Procedure Date: 04/01/2020     Planned Procedure(s):  Ultrasound-guided paracentesis     Indication:  Fluid testing; Therapeutic drainage  __________________________________________________________________    Chief Complaint:  Ascites    History of Present Illness: Kristin Moody is a 64 y.o. female with a history as listed below who presents today for procedure.    Patient Active Problem List    Diagnosis Date Noted   ? 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 Hypotension 02/10/2020   ? 'Light-for-dates' infant with signs of fetal malnutrition 01/22/2020   ? 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 Anemia 09/28/2019   ? Encephalopathy 08/12/2019   ? Spasticity 08/10/2019   ? Hyperreflexia 08/10/2019   ? 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 Right leg weakness 05/14/2019   ? 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 Iron (Fe) deficiency anemia 01/19/2019   ? Preop cardiovascular exam 01/07/2019   ? Pre-transplant evaluation for liver transplant 01/06/2019   ? Cirrhosis (HCC) 12/16/2018   ? Acute on chronic anemia 12/16/2018   ? Lactic acidosis 12/16/2018   ? GI bleed 10/24/2018   ? Portal hypertension (HCC) 08/28/2018   ? Confusion 08/27/2018   ? Dyspnea 08/27/2018   ? Chronic abdominal pain 08/27/2018   ? 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 Acute on chronic blood loss anemia 08/27/2018   ? Iron deficiency anemia 04/13/2018   ? Pneumonia due to infectious organism 04/13/2018   ? Melena 04/10/2018   ? Esophageal varices without bleeding (HCC) 02/25/2018   ? Cirrhosis of liver without ascites (HCC) 02/25/2018   ? 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 Lower abdominal pain 10/24/2014   ? Chest discomfort 09/21/2014   ? Essential hypertension 08/13/2012   ? Abnormal liver function tests 05/13/2011   ? Fatty liver disease, nonalcoholic 05/13/2011   ? Obesity (BMI 30-39.9) 05/13/2011   ? Slow transit constipation 05/13/2011     Medical History:   Diagnosis Date   ? Aneurysm (HCC)    ? 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 Diverticulosis of colon     descending and sigmoid colon   ? Dyspnea    ? Essential hypertension 08/13/2012   ? Fatty infiltration of liver    ? HTN (hypertension)    ? Obesity (BMI 30-39.9) 05/13/2011   ? Preop cardiovascular exam 01/07/2019   ? Sinus infection    ? Type 2 diabetes mellitus (HCC) 09/21/2014      Surgical History:   Procedure Laterality Date   ? HYSTERECTOMY  1994   ? UPPER GASTROINTESTINAL ENDOSCOPY  2009   ? COLONOSCOPY  2009   ? CYSTOCELE REPAIR  2009    with endocele repair   ? RECTOCELE REPAIR  03/2009   ? LIVER BIOPSY  07/17/2010   ? COLONOSCOPY N/A 11/08/2017    Performed by Dawna Part, MD at [facility] ENDO   ? ESOPHAGOGASTRODUODENOSCOPY N/A 11/08/2017    Performed by Dawna Part, MD at [facility] ENDO   ? ESOPHAGOGASTRODUODENOSCOPY WITH BIOPSY - FLEXIBLE  11/08/2017    Performed by Dawna Part, MD at [facility] ENDO   ? COLONOSCOPY WITH HOT BIOPSY FORCEPS REMOVAL TUMOR/ POLYP/ OTHER LESION  11/08/2017    Performed by Dawna Part, MD at [facility] ENDO   ? ESOPHAGOGASTRODUODENOSCOPY WITH BAND LIGATION ESOPHAGEAL/ GASTRIC  VARICES - FLEXIBLE N/A 04/10/2018    Performed by Normajean Baxter, MD at [facility] ENDO   ? ESOPHAGOGASTRODUODENOSCOPY WITH BIOPSY - FLEXIBLE N/A 04/10/2018    Performed by Normajean Baxter, MD at [facility] ENDO   ? ESOPHAGOGASTRODUODENOSCOPY WITH CONTROL OF BLEEDING - FLEXIBLE N/A 04/25/2018    Performed by Jolee Ewing, MD at [facility] ENDO   ? ESOPHAGOGASTRODUODENOSCOPY WITH BIOPSY - FLEXIBLE with push enteroscopy N/A 08/28/2018    Performed by Celesta Gentile, MD at [facility] ENDO   ? EGD N/A 10/27/2018    Performed by Eliott Nine, MD at [facility] ENDO   ? ESOPHAGOGASTRODUODENOSCOPY WITH SNARE REMOVAL TUMOR/ POLYP/ OTHER LESION - FLEXIBLE N/A 10/27/2018    Performed by Eliott Nine, MD at [facility] ENDO   ? ESOPHAGOGASTRODUODENOSCOPY WITH BIOPSY - FLEXIBLE N/A 12/16/2018    Performed by Buckles, Vinnie Level, MD at [facility] ENDO   ? ESOPHAGOGASTRODUODENOSCOPY WITH CONTROL OF BLEEDING - FLEXIBLE N/A 12/16/2018    Performed by Buckles, Vinnie Level, MD at [facility] ENDO   ? ESOPHAGOGASTRODUODENOSCOPY WITH SPECIMEN COLLECTION BY BRUSHING/ WASHING N/A 01/22/2019    Performed by Veneta Penton, MD at [facility] ENDO   ? ESOPHAGOGASTRODUODENOSCOPY WITH DILATION ESOPHAGUS WITH BALLOON 30 MM OR GREATER - FLEXIBLE N/A 01/22/2019    Performed by Veneta Penton, MD at [facility] ENDO   ? ESOPHAGOGASTRODUODENOSCOPY WITH BIOPSY - FLEXIBLE N/A 01/22/2019    Performed by Veneta Penton, MD at [facility] ENDO   ? ESOPHAGOGASTRODUODENOSCOPY WITH BIOPSY - FLEXIBLE N/A 06/19/2019    Performed by Dawna Part, MD at [facility] ENDO   ? ESOPHAGOGASTRODUODENOSCOPY [WITH APC]  WITH SPECIMEN COLLECTION BY BRUSHING/ WASHING N/A 08/07/2019    Performed by Dawna Part, MD at [facility] ENDO   ? ESOPHAGOGASTRODUODENOSCOPY WITH SPECIMEN COLLECTION BY BRUSHING/ WASHING N/A 09/10/2019    Performed by Buckles, Vinnie Level, MD at [facility] ENDO   ? ESOPHAGOGASTRODUODENOSCOPY WITH CONTROL OF BLEEDING - FLEXIBLE N/A 09/10/2019    Performed by Buckles, Vinnie Level, MD at [facility] ENDO   ? ESOPHAGOGASTRODUODENOSCOPY WITH CONTROL OF BLEEDING - FLEXIBLE N/A 02/12/2020    Performed by Lenor Derrick, MD at [facility] ENDO   ? ESOPHAGOGASTRODUODENOSCOPY WITH BIOPSY - FLEXIBLE N/A 02/26/2020    Performed by Buckles, Vinnie Level, MD at [facility] ENDO   ? CHOLECYSTECTOMY     ? COLONOSCOPY     ? ESOPHAGOGASTRIC FUNDOPLICATION  2003, 2004    laparoscopic   ? ESOPHAGOGASTRIC FUNDOPLICATION     ? HX HYSTERECTOMY     ? PR ESOPHAGOSCOPY FLEXIBLE TRANSORAL DIAGNOSTIC        Social History     Tobacco Use   ? Smoking status: Never Smoker   ? Smokeless tobacco: Never Used   Substance Use Topics   ? Alcohol use: No      Family History   Problem Relation Age of Onset   ? Other Mother    ? Kidney Failure Mother    ? Osteoporosis Mother    ? Cancer Father         esophageal   ? Liver Disease Sister         endstage, s/p liver transplant   ? Cancer Brother         tonsil, liver    ? Hip Fracture Neg Hx       Medications Prior to Admission   Medication Sig Dispense Refill Last Dose   ? cetirizine (ZYRTEC) 10 mg tablet Take 10 mg by mouth daily as needed for Allergy  symptoms.   Unknown   ? ciprofloxacin (CIPRO) 500 mg tablet Take one tablet by mouth daily. 30 tablet 1 03/31/2020   ? duloxetine DR (CYMBALTA) 60 mg capsule Take 60 mg by mouth daily.   04/01/2020   ? midodrine (PROAMITINE) 10 mg tablet Take one tablet by mouth three times daily. 90 tablet 1 04/01/2020   ? pantoprazole DR (PROTONIX) 40 mg tablet Take one tablet by mouth twice daily. 60 tablet 3 04/01/2020   ? polyethylene glycol 3350 (MIRALAX) 17 g packet Take one packet by mouth twice daily.   03/31/2020   ? promethazine (PHENERGAN) 25 mg tablet Take 25 mg by mouth every 4 hours as needed. Just uses it with Tramadol   03/31/2020   ? rifAXIMin (XIFAXAN) 550 mg tablet Take one tablet by mouth twice daily. 180 tablet 3 04/01/2020   ? zinc sulfate 220 mg (50 mg elemental zinc) capsule Take one capsule by mouth daily. 90 capsule 3 04/01/2020     Allergies   Allergen Reactions   ? Tegaderm BLISTERS   ? Adhesive Tape (Rosins) RASH   ? Sulfa (Sulfonamide Antibiotics) RASH   ? Codeine NAUSEA AND VOMITING   ? Morphine SEE COMMENTS     Pt reports burns her veins   ? Penicillin G SEE COMMENTS     Throat issues, blisters in throat    ? Tramadol NAUSEA ONLY and ITCHING     Takes this medication regularly        Review of Systems  Constitutional: negative  Ears, nose, mouth, throat, and face: negative  Respiratory: negative  Cardiovascular: negative  Gastrointestinal: Abdominal distention  Musculoskeletal:negative  Neurological: negative  Behavioral/Psych: negative     Physical Exam:  Vital Signs: Last Filed In 24 Hours Vital Signs: 24 Hour Range   BP: 116/56 (05/14 1301)  Temp: 36.8 ?C (98.2 ?F) (05/14 1235)  Pulse: 97 (05/14 1301)  Respirations: 23 PER MINUTE (05/14 1245)  SpO2: (P) 98 % (05/14 1301)  SpO2 Pulse: (P) 97 (05/14 1301)  Height: 161.3 cm (63.5) (05/14 1235) BP: (102-116)/(47-56)   Temp:  [36.8 ?C (98.2 ?F)]   Pulse:  [97]   Respirations:  [23 PER MINUTE-27 PER MINUTE]   SpO2:  [98 %-99 %]    Intensity Pain Scale (Self Report): 7 (04/01/20 1235)      General appearance: alert and no distress noted.  Neurologic: Grossly normal.  Lungs: Non labored.  Heart: regular rate and rhythm  Abdomen: distended    Pre-procedure anxiolysis plan: N/A  Sedation/Medication Plan: Local anesthetic  Personal history of sedation complications: Denies adverse event.   Family history of sedation complications: Denies adverse event.   Medications for Reversal: NA  Discussion/Reviews:  Physician has discussed risks and alternatives of this type of sedation and above planned procedures with patient    NPO Status: NA  Airway:  NA  Head and Neck: NA  Mouth: NA   Anesthesia Classification:  ASA III (A patient with a severe systemic disease that limits activity, but is not incapacitating)  Pregnancy Status: N/A    Lab/Radiology/Other Diagnostic Tests:  Labs:    Hematology:    Lab Results   Component Value Date    HGB 7.4 04/01/2020    HCT 22.6 04/01/2020    PLTCT 212 04/01/2020    WBC 6.5 04/01/2020    NEUT 68 04/01/2020    ANC 4.47 04/01/2020    LYMPH 9.3 02/23/2020    ALC 0.77 04/01/2020    MONA  15 04/01/2020    AMC 0.96 04/01/2020    ABC 0.08 04/01/2020    BASOPHILS 1.2 02/23/2020    MCV 87.2 04/01/2020    MCHC 32.9 04/01/2020    MPV 8.5 04/01/2020    RDW 21.3 04/01/2020   , Coagulation:    Lab Results   Component Value Date    PT 15.8 02/23/2020    PTT 32.8 02/25/2020    INR 1.4 03/25/2020    and General Chemistry:    Lab Results   Component Value Date    NA 131 03/25/2020    K 4.2 03/25/2020    CL 106 03/25/2020    GAP 6 03/25/2020    BUN 27 03/25/2020    CR 1.02 03/25/2020    GLU 121 03/25/2020    GLU 172 02/23/2020    CA 8.0 03/25/2020    ALBUMIN 2.8 03/25/2020    LACTIC 1.3 02/11/2020    MG 2.4 08/14/2019 TOTBILI 1.9 03/25/2020              Philmore Pali, APRN-NP  Pager [phone]

## 2020-04-04 ENCOUNTER — Encounter: Admit: 2020-04-04 | Discharge: 2020-04-04 | Payer: MEDICARE

## 2020-04-04 DIAGNOSIS — N186 End stage renal disease: Secondary | ICD-10-CM

## 2020-04-04 DIAGNOSIS — S62109S Fracture of unspecified carpal bone, unspecified wrist, sequela: Secondary | ICD-10-CM

## 2020-04-04 DIAGNOSIS — K7460 Unspecified cirrhosis of liver: Secondary | ICD-10-CM

## 2020-04-04 NOTE — Telephone Encounter
Requested call regarding fell and broke wrist.

## 2020-04-05 ENCOUNTER — Encounter: Admit: 2020-04-05 | Discharge: 2020-04-05 | Payer: MEDICARE

## 2020-04-05 ENCOUNTER — Ambulatory Visit: Admit: 2020-04-05 | Discharge: 2020-04-05 | Payer: MEDICARE

## 2020-04-05 DIAGNOSIS — K7460 Unspecified cirrhosis of liver: Secondary | ICD-10-CM

## 2020-04-05 DIAGNOSIS — D649 Anemia, unspecified: Secondary | ICD-10-CM

## 2020-04-05 DIAGNOSIS — M25532 Pain in left wrist: Secondary | ICD-10-CM

## 2020-04-05 DIAGNOSIS — K31819 Angiodysplasia of stomach and duodenum without bleeding: Secondary | ICD-10-CM

## 2020-04-05 LAB — CBC AND DIFF
Lab: 0.1 10*3/uL (ref 0–0.20)
Lab: 0.1 10*3/uL (ref 0–0.45)
Lab: 0.6 10*3/uL (ref 0–0.80)
Lab: 0.6 10*3/uL — ABNORMAL LOW (ref 1.0–4.8)
Lab: 2 % — ABNORMAL LOW (ref >60)
Lab: 2.4 M/UL — ABNORMAL LOW (ref 4.0–5.0)
Lab: 20 % — ABNORMAL LOW (ref 36–45)
Lab: 21 % — ABNORMAL HIGH (ref 11–15)
Lab: 231 10*3/uL — ABNORMAL HIGH (ref 150–400)
Lab: 3 % — ABNORMAL LOW (ref >60)
Lab: 31 g/dL — ABNORMAL LOW (ref 32.0–36.0)
Lab: 5.4 10*3/uL (ref 1.8–7.0)
Lab: 6.6 g/dL — ABNORMAL LOW (ref 12.0–15.0)
Lab: 7.1 10*3/uL (ref 4.5–11.0)
Lab: 77 % (ref 41–77)
Lab: 8.2 FL — ABNORMAL HIGH (ref 7–11)
Lab: 9 % (ref 4–12)
Lab: 9 % — ABNORMAL LOW (ref 24–44)

## 2020-04-05 LAB — PROTIME INR (PT): Lab: 1.5 — ABNORMAL HIGH (ref 0.8–1.2)

## 2020-04-05 LAB — COMPREHENSIVE METABOLIC PANEL
Lab: 135 MMOL/L — ABNORMAL LOW (ref 137–147)
Lab: 4.7 MMOL/L (ref 3.5–5.1)

## 2020-04-05 NOTE — Telephone Encounter
patient hgb 6.6. scheduled for blood transfusion Thursday at 0900. patient notified

## 2020-04-06 ENCOUNTER — Encounter: Admit: 2020-04-06 | Discharge: 2020-04-06 | Payer: MEDICARE

## 2020-04-07 ENCOUNTER — Ambulatory Visit: Admit: 2020-04-07 | Discharge: 2020-04-08 | Payer: MEDICARE

## 2020-04-07 ENCOUNTER — Encounter: Admit: 2020-04-07 | Discharge: 2020-04-07 | Payer: MEDICARE

## 2020-04-07 DIAGNOSIS — D649 Anemia, unspecified: Secondary | ICD-10-CM

## 2020-04-07 DIAGNOSIS — K7460 Unspecified cirrhosis of liver: Secondary | ICD-10-CM

## 2020-04-07 NOTE — Patient Instructions
[facility]  OUTPATIENT POST TRANSFUSION INSTRUCTIONS     During your transfusion you were monitored by nursing staff for signs of a transfusion reaction, however, you should continue to observe for the following symptoms after you have been dismissed from the clinic:     FEVER OF 100.5 OR HIGHER  CHILLS  NAUSEA OR VOMITING  FEELING FAINT OR DIZZY  SHORTNESS OF BREATH  DARK OR RED COLORED URINE  CHEST OR BACK PAIN  SHOCK OR LOSS OF CONSCIOUSNESS  YELLOWING OF THE EYES OR SKIN     If you or your caregiver notice any of these symptoms, contact your ordering physician immediately and go to the nearest Emergency Department for treatment. When you arrive at the Emergency Department notify them that you recently received a blood transfusion. *For more detailed signs and symptoms of a transfusion reaction, please refer to the [place] education information below.          When You Need a Blood Transfusion (Adult)  A blood transfusion may be done when you have lost blood because of an injury or during surgery. It can also be done because of diseases or conditions that affect the blood. Blood is made up of several different parts (blood products). You may receive some or all of these blood products during a transfusion. Blood for transfusion is usually donated from another person (donor). Strict measures are taken to make sure that donated blood is safe before it's given to you. This sheet helps you understand how a blood transfusion is done. Your healthcare provider will discuss your condition with you and answer your questions.   The parts of blood  Blood can be broken down into different parts that perform special roles in the body. These parts include:  ? Red blood cells, which carry oxygen throughout the body.  ? Platelets, which help stop bleeding.  ? Plasma (the liquid part of blood), which carries red blood cells and platelets throughout the body. Plasma also helps platelets in stopping bleeding. Where does donated blood come from?  ? Volunteer donors. These are people who donate their blood to help others in need of blood. Blood donation can take place at several places, including a hospital, blood bank, or during a blood drive.  ? Directed donation. If you need a blood transfusion during a planned surgery, family and friends can have their blood tested for compatibility and donate blood for you before the surgery. This needs to be done at least 7 day(s) in advance. This is because the blood must be tested for safety.  ? Autologous donation. This is also called self-donation. For planned surgery, you can donate your own blood starting up to 6 weeks before surgery.  Are blood transfusions safe?  Donated blood is tested and processed to make sure that the blood is safe:  ? The health and medical history of each donor is carefully screened. If a person is considered high-risk for infection or problems, he or she isn't accepted as a blood donor.  ? Donated blood is tested for infections such as hepatitis, syphilis, and HIV (the virus that causes AIDS). If the tested blood is found to be unsafe, it's destroyed.  ? Blood is divided into four types: A, B, AB, and O. Blood also has Rh types: positive (+) and negative (-). You can only receive blood products that are compatible with (match) your blood type. A sample of your blood is tested for compatibility with donated blood. This is  done before blood products are prepared for a transfusion.  How is a blood transfusion done?  A blood transfusion takes place in a blood center, infusion center, hospital room, or operating room. Your healthcare provider will discuss the blood transfusion with you before it's done. You'll need to give permission for the blood transfusion by signing a consent form.  ? Two healthcare providers confirm your identity. They also confirm that they have the correct blood product(s) for you.  ? An intravenous (IV) line is placed in a vein if you do not already have an IV.  ? The blood product comes in a plastic bag that is hung on an IV pole. The blood product flows from the bag into your IV line. The IV line may be connected to a pump, which controls the transfusion rate. You may receive more than one kind of blood product through the IV.  ? Your vital signs (blood pressure, heart rate, respiratory rate, and temperature) are checked throughout the transfusion. This is to make sure you are not having a reaction to the blood product.  ? The IV line may be removed once the transfusion is complete.  Possible risks and complications of blood transfusions  Most transfusions are problem free. In some cases, reactions occur. These can happen within seconds to minutes during the transfusion or a week to a few months after the transfusion. Call your doctor or nurse right away if you have any of the signs or symptoms in the table below during or after a transfusion:  Reaction Timing Symptoms   Allergic reaction (mild) ? Within seconds to minutes during the transfusion  ? Up to 24 hours after the transfusion Hives or red welts on the skin, mild itching, rash, localized swelling, flushing (red face), wheezing, shortness of breath, or stridor (high-pitched noise or sound)   Anaphylactic reaction ? Within seconds to minutes during the transfusion  ? Up to 24 hours after the transfusion Shortness of breath, flushing (red face), wheezing, labored (working hard) breathing, low blood pressure, localized swelling, chest tightness, or cramps   Febrile nonhemolytic reaction ? Within minutes to hours during the transfusion  ? Within a few hours to 24 hours after the transfusion Fever (increase of 1? C or higher), chills, flushing (red face), nausea, headache, minor discomfort, or mild shortness of breath   Acute immune hemolytic reaction ? Within minutes during the transfusion  ? Up to 24 hours after the transfusion Fever, red or brown urine, back pain, fast heart rate (tachycardia), abdominal pain, low blood pressure, feeling anxious, chills, chest pain, nausea, or fainting spells   Transfusion-related acute lung injury (TRALI) ? Within 1 to 2 hours during the transfusion  ? Up to 6 hours after the transfusion Shortness of breath, trouble breathing, low blood pressure, fever, pulmonary edema   Transfusion-associated circulatory overload ? Near the end of the transfusion  ? Within 6 hours after the transfusion Shortness of breath, fast heart rate (tachycardia), problems breathing when lying on back, abnormal blood pressure   Post-transfusion purpura (PUP) ? Within 1 week  ? Up to 48 days after the transfusion Purple spots on skin; nose bleed; bleeding from the urinary tract, abdomen, colon, or rectum; fever; or chills     Delayed transfusion-related acute lung injury (TRALI) ? Within 72 hours (3 days) after the transfusion Sudden onset of respiratory distress or trouble breathing   Delayed hemolytic reaction ? Within 3 to 7 days  ? Up to weeks after the  transfusion Low-grade fever, mild jaundice (yellowing of the skin and whites of the eyes), decrease in hematocrit, chills, chest pain, back pain, nausea   ? 2000-2019 [employer], [place]. [address] [place], [place] [address]. All rights reserved. This information is not intended as a substitute for professional medical care. Always follow your healthcare professional's instructions.

## 2020-04-08 ENCOUNTER — Ambulatory Visit: Admit: 2020-04-08 | Discharge: 2020-04-08 | Payer: MEDICARE

## 2020-04-08 ENCOUNTER — Encounter: Admit: 2020-04-08 | Discharge: 2020-04-08 | Payer: MEDICARE

## 2020-04-08 DIAGNOSIS — K7469 Other cirrhosis of liver: Secondary | ICD-10-CM

## 2020-04-08 DIAGNOSIS — R188 Other ascites: Secondary | ICD-10-CM

## 2020-04-08 DIAGNOSIS — K5730 Diverticulosis of large intestine without perforation or abscess without bleeding: Secondary | ICD-10-CM

## 2020-04-08 DIAGNOSIS — K7460 Unspecified cirrhosis of liver: Secondary | ICD-10-CM

## 2020-04-08 DIAGNOSIS — K760 Fatty (change of) liver, not elsewhere classified: Secondary | ICD-10-CM

## 2020-04-08 DIAGNOSIS — R0600 Dyspnea, unspecified: Secondary | ICD-10-CM

## 2020-04-08 DIAGNOSIS — I10 Essential (primary) hypertension: Secondary | ICD-10-CM

## 2020-04-08 DIAGNOSIS — Z01810 Encounter for preprocedural cardiovascular examination: Secondary | ICD-10-CM

## 2020-04-08 DIAGNOSIS — K31819 Angiodysplasia of stomach and duodenum without bleeding: Secondary | ICD-10-CM

## 2020-04-08 DIAGNOSIS — E669 Obesity, unspecified: Secondary | ICD-10-CM

## 2020-04-08 DIAGNOSIS — D500 Iron deficiency anemia secondary to blood loss (chronic): Secondary | ICD-10-CM

## 2020-04-08 DIAGNOSIS — I729 Aneurysm of unspecified site: Secondary | ICD-10-CM

## 2020-04-08 DIAGNOSIS — E119 Type 2 diabetes mellitus without complications: Secondary | ICD-10-CM

## 2020-04-08 DIAGNOSIS — J329 Chronic sinusitis, unspecified: Secondary | ICD-10-CM

## 2020-04-08 LAB — CBC AND DIFF
Lab: 2.3 M/UL — ABNORMAL LOW (ref 4.0–5.0)
Lab: 20 % — ABNORMAL HIGH (ref 11–15)
Lab: 20 % — ABNORMAL LOW (ref 36–45)
Lab: 209 10*3/uL (ref 150–400)
Lab: 27 pg (ref 26–34)
Lab: 32 g/dL (ref 32.0–36.0)
Lab: 6.5 g/dL — ABNORMAL LOW (ref 12.0–15.0)
Lab: 76 % (ref 41–77)
Lab: 8.5 FL (ref 7–11)
Lab: 84 FL (ref 80–100)
Lab: 9.6 10*3/uL (ref 4.5–11.0)

## 2020-04-08 LAB — COMPREHENSIVE METABOLIC PANEL
Lab: 1 mg/dL — ABNORMAL HIGH (ref 0.4–1.00)
Lab: 1.2 mg/dL (ref 0.3–1.2)
Lab: 106 MMOL/L (ref 98–110)
Lab: 114 mg/dL — ABNORMAL HIGH (ref 70–100)
Lab: 135 MMOL/L — ABNORMAL LOW (ref 137–147)
Lab: 153 U/L — ABNORMAL HIGH (ref 25–110)
Lab: 18 U/L (ref 7–56)
Lab: 2.7 g/dL — ABNORMAL LOW (ref 3.5–5.0)
Lab: 22 MMOL/L (ref 21–30)
Lab: 35 mg/dL — ABNORMAL HIGH (ref 7–25)
Lab: 4.6 MMOL/L (ref 3.5–5.1)
Lab: 44 U/L — ABNORMAL HIGH (ref 7–40)
Lab: 5.1 g/dL — ABNORMAL LOW (ref 6.0–8.0)
Lab: 52 mL/min — ABNORMAL LOW (ref >60)
Lab: 8.3 mg/dL — ABNORMAL LOW (ref 8.5–10.6)
Lab: >60 mL/min (ref >60)
Lab: 7 (ref 3–12)

## 2020-04-08 LAB — GRAM STAIN

## 2020-04-08 LAB — PROTIME INR (PT): Lab: 1.5 — ABNORMAL HIGH (ref 0.8–1.2)

## 2020-04-08 MED ORDER — ALBUMIN, HUMAN 25 % IV SOLP
0 refills | Status: CP
Start: 2020-04-08 — End: ?
  Administered 2020-04-08: 18:00:00 37.5 g via INTRAVENOUS

## 2020-04-08 NOTE — Other
Immediate Post Procedure Note    Date:  04/08/2020                                         Attending Physician:   Arjuna Doeden MD    Procedure(s):  paracentesis  Pre/Post Diagnosis:  ascites  Description/Findings:  Large ascites  Anesthesia:  2% lidocaine       Time out performed: Consent obtained, correct patient verified, correct procedure verified, correct site verified, patient marked as necessary.  Estimated Blood Loss:  None/Negligible  Specimen(s) Removed/Disposition:  cloudy straw color fluid  Complications: None    Jasmine Pang, MD

## 2020-04-08 NOTE — Progress Notes
Pt meets criteria for discharge home.  PIV dc'd.  Discharge instructions reviewed.  Husband instructed to pull car around.    1325 Pt escorted to main entrance via wheelchair.  Discharged home via private vehicle with husband, Baldo Ash.

## 2020-04-08 NOTE — Patient Instructions
PARACENTESIS   A paracentesis is the removal of an abnormal buildup of fluid in your abdominal cavity. This fluid buildup is called ascites and may be caused by conditions such as liver disease, heart failure, or cancer. During this procedure, a needle is inserted into your abdomen to drain the fluid. The fluid may then be sent to the lab for testing if medically indicated. Removal of the fluid may also relieve belly pressure and shortness of breath caused by the ascites.?  POST-PROCEDURE PAIN:   ? Pain control following your procedure is a priority for both you and your Physicians.  ? Some soreness or tenderness at the site is to be expected for several days. We recommend taking over the counter analgesics to help relieve this pain.  ? Alternative methods for pain relief include but not limited to heat or cold compress, relaxation techniques, rest, and changing of positions.  ? If pain continues after 5-7 days or you have severe pain not relieved by medication, please contact us as directed below.?  POST-PROCEDURE ACTIVITY:   ? A responsible adult must drive you home.  ? If you receive sedation, narcotic pain medication or anesthesia for the procedure, you should not drive or operate heavy machinery or do anything that requires concentration for at least 24 hours after procedure completion.  ? It is recommended that a responsible adult be with you until morning.?  POST-PROCEDURE SITE CARE:   ? You will have a small bandage over the procedure site. Keep this dry.  ? You may remove it in 24 hours.  ? You may shower in 24 hours, after removing the bandage.  ? A dry gauze bandage may be reapplied as necessary to protect your clothing as the site may sometimes leak for several days after the procedure.  ? Do not submerge the procedure site for 1 week (no bathtub, swimming, hot tub, etc.)  ? Do not use ointments, creams or powders on the puncture site.  ? Be sure your hands are clean when touching near the site.? DIET/MEDICATIONS:   ? You may resume your previous diet after the procedure.  ? If you receive sedation or narcotic pain medications, avoid any foods or beverages containing alcohol for at least 24 hours after the procedure.  ? Please see the Medication Reconciliation sheet for instructions regarding resuming your home medications.?  CALL THE DOCTOR IF:   ? Bright red blood soaks the bandage.  ? You have pain not relieved by medication. Some soreness at the site is to be expected.  ? You have signs of infection such as: fever greater than 101F, chills, redness, warmth, swelling, drainage or pus from the puncture site.  For any of the above symptoms or for problems or concerns related to the procedure performed at the [place] Location, call [phone] Monday-Friday from 7-5p. After-hours and weekends, please call [phone] and ask for the Interventional Radiology Resident on-call.   You or your caregiver should call 911 for any severe symptoms such as excessive bleeding, severe dizziness, trouble breathing or loss of consciousness.   ?  ?

## 2020-04-08 NOTE — H&P (View-Only)
IR Pre-Procedure History and Physical/Sedation Plan    Procedure Date: 04/08/2020     Planned Procedure(s):  Ultrasound-guided paracentesis     Indication:  Fluid testing; Therapeutic drainage  __________________________________________________________________    Chief Complaint:  Ascites    History of Present Illness: Kristin Moody is a 64 y.o. female with a history as listed below who presents today for procedure.    Patient Active Problem List    Diagnosis Date Noted   ? 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 Hypotension 02/10/2020   ? 'Light-for-dates' infant with signs of fetal malnutrition 01/22/2020   ? 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 Anemia 09/28/2019   ? Encephalopathy 08/12/2019   ? Spasticity 08/10/2019   ? Hyperreflexia 08/10/2019   ? 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 Right leg weakness 05/14/2019   ? 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 Iron (Fe) deficiency anemia 01/19/2019   ? Preop cardiovascular exam 01/07/2019   ? Pre-transplant evaluation for liver transplant 01/06/2019   ? Cirrhosis (HCC) 12/16/2018   ? Acute on chronic anemia 12/16/2018   ? Lactic acidosis 12/16/2018   ? GI bleed 10/24/2018   ? Portal hypertension (HCC) 08/28/2018   ? Confusion 08/27/2018   ? Dyspnea 08/27/2018   ? Chronic abdominal pain 08/27/2018   ? 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 Acute on chronic blood loss anemia 08/27/2018   ? Iron deficiency anemia 04/13/2018   ? Pneumonia due to infectious organism 04/13/2018   ? Melena 04/10/2018   ? Esophageal varices without bleeding (HCC) 02/25/2018   ? Cirrhosis of liver without ascites (HCC) 02/25/2018   ? 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 Lower abdominal pain 10/24/2014   ? Chest discomfort 09/21/2014   ? Essential hypertension 08/13/2012   ? Abnormal liver function tests 05/13/2011   ? Fatty liver disease, nonalcoholic 05/13/2011   ? Obesity (BMI 30-39.9) 05/13/2011   ? Slow transit constipation 05/13/2011     Medical History:   Diagnosis Date   ? Aneurysm (HCC)    ? 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 Diverticulosis of colon     descending and sigmoid colon   ? Dyspnea    ? Essential hypertension 08/13/2012   ? Fatty infiltration of liver    ? HTN (hypertension)    ? Obesity (BMI 30-39.9) 05/13/2011   ? Preop cardiovascular exam 01/07/2019   ? Sinus infection    ? Type 2 diabetes mellitus (HCC) 09/21/2014      Surgical History:   Procedure Laterality Date   ? HYSTERECTOMY  1994   ? UPPER GASTROINTESTINAL ENDOSCOPY  2009   ? COLONOSCOPY  2009   ? CYSTOCELE REPAIR  2009    with endocele repair   ? RECTOCELE REPAIR  03/2009   ? LIVER BIOPSY  07/17/2010   ? COLONOSCOPY N/A 11/08/2017    Performed by Dawna Part, MD at [facility] ENDO   ? ESOPHAGOGASTRODUODENOSCOPY N/A 11/08/2017    Performed by Dawna Part, MD at [facility] ENDO   ? ESOPHAGOGASTRODUODENOSCOPY WITH BIOPSY - FLEXIBLE  11/08/2017    Performed by Dawna Part, MD at [facility] ENDO   ? COLONOSCOPY WITH HOT BIOPSY FORCEPS REMOVAL TUMOR/ POLYP/ OTHER LESION  11/08/2017    Performed by Dawna Part, MD at [facility] ENDO   ? ESOPHAGOGASTRODUODENOSCOPY WITH BAND LIGATION ESOPHAGEAL/ GASTRIC  VARICES - FLEXIBLE N/A 04/10/2018    Performed by Normajean Baxter, MD at [facility] ENDO   ? ESOPHAGOGASTRODUODENOSCOPY WITH BIOPSY - FLEXIBLE N/A 04/10/2018    Performed by Normajean Baxter, MD at [facility] ENDO   ? ESOPHAGOGASTRODUODENOSCOPY WITH CONTROL OF BLEEDING - FLEXIBLE N/A 04/25/2018    Performed by Jolee Ewing, MD at [facility] ENDO   ? ESOPHAGOGASTRODUODENOSCOPY WITH BIOPSY - FLEXIBLE with push enteroscopy N/A 08/28/2018    Performed by Celesta Gentile, MD at [facility] ENDO   ? EGD N/A 10/27/2018    Performed by Eliott Nine, MD at [facility] ENDO   ? ESOPHAGOGASTRODUODENOSCOPY WITH SNARE REMOVAL TUMOR/ POLYP/ OTHER LESION - FLEXIBLE N/A 10/27/2018    Performed by Eliott Nine, MD at [facility] ENDO   ? ESOPHAGOGASTRODUODENOSCOPY WITH BIOPSY - FLEXIBLE N/A 12/16/2018    Performed by Buckles, Vinnie Level, MD at [facility] ENDO   ? ESOPHAGOGASTRODUODENOSCOPY WITH CONTROL OF BLEEDING - FLEXIBLE N/A 12/16/2018    Performed by Buckles, Vinnie Level, MD at [facility] ENDO   ? ESOPHAGOGASTRODUODENOSCOPY WITH SPECIMEN COLLECTION BY BRUSHING/ WASHING N/A 01/22/2019    Performed by Veneta Penton, MD at [facility] ENDO   ? ESOPHAGOGASTRODUODENOSCOPY WITH DILATION ESOPHAGUS WITH BALLOON 30 MM OR GREATER - FLEXIBLE N/A 01/22/2019    Performed by Veneta Penton, MD at [facility] ENDO   ? ESOPHAGOGASTRODUODENOSCOPY WITH BIOPSY - FLEXIBLE N/A 01/22/2019    Performed by Veneta Penton, MD at [facility] ENDO   ? ESOPHAGOGASTRODUODENOSCOPY WITH BIOPSY - FLEXIBLE N/A 06/19/2019    Performed by Dawna Part, MD at [facility] ENDO   ? ESOPHAGOGASTRODUODENOSCOPY [WITH APC]  WITH SPECIMEN COLLECTION BY BRUSHING/ WASHING N/A 08/07/2019    Performed by Dawna Part, MD at [facility] ENDO   ? ESOPHAGOGASTRODUODENOSCOPY WITH SPECIMEN COLLECTION BY BRUSHING/ WASHING N/A 09/10/2019    Performed by Buckles, Vinnie Level, MD at [facility] ENDO   ? ESOPHAGOGASTRODUODENOSCOPY WITH CONTROL OF BLEEDING - FLEXIBLE N/A 09/10/2019    Performed by Buckles, Vinnie Level, MD at [facility] ENDO   ? ESOPHAGOGASTRODUODENOSCOPY WITH CONTROL OF BLEEDING - FLEXIBLE N/A 02/12/2020    Performed by Lenor Derrick, MD at [facility] ENDO   ? ESOPHAGOGASTRODUODENOSCOPY WITH BIOPSY - FLEXIBLE N/A 02/26/2020    Performed by Buckles, Vinnie Level, MD at [facility] ENDO   ? CHOLECYSTECTOMY     ? COLONOSCOPY     ? ESOPHAGOGASTRIC FUNDOPLICATION  2003, 2004    laparoscopic   ? ESOPHAGOGASTRIC FUNDOPLICATION     ? HX HYSTERECTOMY     ? PR ESOPHAGOSCOPY FLEXIBLE TRANSORAL DIAGNOSTIC        Social History     Tobacco Use   ? Smoking status: Never Smoker   ? Smokeless tobacco: Never Used   Substance Use Topics   ? Alcohol use: No      Family History   Problem Relation Age of Onset   ? Other Mother    ? Kidney Failure Mother    ? Osteoporosis Mother    ? Cancer Father         esophageal   ? Liver Disease Sister         endstage, s/p liver transplant   ? Cancer Brother         tonsil, liver    ? Hip Fracture Neg Hx       Medications Prior to Admission   Medication Sig Dispense Refill Last Dose   ? cetirizine (ZYRTEC) 10 mg tablet Take 10 mg by mouth daily as needed for Allergy  symptoms.      ? ciprofloxacin (CIPRO) 500 mg tablet Take one tablet by mouth daily. 30 tablet 1    ? duloxetine DR (CYMBALTA) 60 mg capsule Take 60 mg by mouth daily.      ? midodrine (PROAMITINE) 10 mg tablet Take one tablet by mouth three times daily. 90 tablet 1    ? pantoprazole DR (PROTONIX) 40 mg tablet Take one tablet by mouth twice daily. 60 tablet 3    ? polyethylene glycol 3350 (MIRALAX) 17 g packet Take one packet by mouth twice daily.      ? promethazine (PHENERGAN) 25 mg tablet Take 25 mg by mouth every 4 hours as needed. Just uses it with Tramadol      ? rifAXIMin (XIFAXAN) 550 mg tablet Take one tablet by mouth twice daily. 180 tablet 3    ? zinc sulfate 220 mg (50 mg elemental zinc) capsule Take one capsule by mouth daily. 90 capsule 3      Allergies   Allergen Reactions   ? Tegaderm BLISTERS   ? Adhesive Tape (Rosins) RASH   ? Sulfa (Sulfonamide Antibiotics) RASH   ? Codeine NAUSEA AND VOMITING   ? Morphine SEE COMMENTS     Pt reports burns her veins   ? Penicillin G SEE COMMENTS     Throat issues, blisters in throat    ? Tramadol NAUSEA ONLY and ITCHING     Takes this medication regularly        Review of Systems  Constitutional: negative for fevers and chills  Respiratory: negative  Cardiovascular: negative  Gastrointestinal: positive for abdominal pain, negative for nausea and vomiting    Physical Exam:  Vital Signs: Last Filed In 24 Hours Vital Signs: 24 Hour Range   BP: 102/60 (05/20 1241)  Temp: 37 ?C (98.6 ?F) (05/20 1241)  Pulse: 95 (05/20 1241)  Respirations: 18 PER MINUTE (05/20 1241)  SpO2: 98 % (05/20 1241) BP: (102-110)/(58-60)   Temp:  [36.9 ?C (98.4 ?F)-37 ?C (98.6 ?F)]   Pulse:  [95]   Respirations:  [18 PER MINUTE]   SpO2:  [97 %-98 %] General appearance: alert and no distress noted.  Neurologic: Grossly normal.  Lungs: Non labored.  Heart: regular rate and rhythm  Abdomen: distended    Pre-procedure anxiolysis plan: N/A  Sedation/Medication Plan: Local anesthetic  Personal history of sedation complications: Denies adverse event.   Family history of sedation complications: Denies adverse event.   Medications for Reversal: NA  Discussion/Reviews:  Physician has discussed risks and alternatives of this type of sedation and above planned procedures with patient    NPO Status: NA  Airway:  NA  Head and Neck: NA  Mouth: NA   Anesthesia Classification:  ASA III (A patient with a severe systemic disease that limits activity, but is not incapacitating)  Pregnancy Status: Not Pregnant    Lab/Radiology/Other Diagnostic Tests:  Labs:  Pertinent labs reviewed           Dollene Primrose, APRN-NP  Pager [phone]

## 2020-04-08 NOTE — Progress Notes
Interventional Radiology Outpatient Scheduling Checklist  ?  ??1.??Name of Procedure(s):???Paracentesis  ?  ??2.??Date of Procedure:???04/14/2020???  ?  ??3.??Arrival Time:?1000  ?  ??4.??Procedure Time:??1100  ?  ??5.??Correct Procedural Room Assignment:??IR BH #7 SONO  ?  ??6.??Blood Thinners Triaged and instructed per protocol: Y/N/NA: ?NA  Confirmed accurate instructions sent to patient: Y/N: ?NA   ?  ??7.??Procedure Order Verified: Y/N: ?Yes  ?  ??9.??Patient instructed to have a driver: Y/N/NA: ?Yes  ?  10.??Patient instructed on NPO status: Y/N/NA: ?NO Restrictions.  Confirmed accurate instructions sent to patient: Y/N: ?Yes  ?  11.??Specimen needed: Y/N/NA: ?Yes   Verified Order placed: Y/N: ?Yes  ?  12.??Allergies Verified:??Y/N:??Yes  ?  13.??Is there an Iodine Allergy: Y/N:??No  Does the Procedure Require contrast: Y/N:??NO  If so, was the IR- Contrast Allergy Pre-Procedure Medication protocol ordered: Y/NA: ?NA  ?  14.??Does the patient have labs according to IR Pre-procedure Laboratory Parameter policy: Y/N/NA: ?Yes  If No, was the patient instructed to obtain labs prior to procedure: Y/N/NA: ?NA   ?  15.??Will the patient need to be admitted or have a possible admission: Y/N: ?No  If yes, confirmed accurate instructions sent to patient: Y/N/NA: ?NA   ?  16.??Patient States Understanding:?Y/N: ?Yes  ?  17.??History of OSA:??Y/N: ?No  If yes, confirm request to bring CPAP sent to patient: Y/N/NA: ?NA  ?  18. Patient declines electronic procedure instructions: Y/N: ?No

## 2020-04-08 NOTE — Patient Education
Dear?Ms Neece,  ?  Thank you for choosing [facility] Interventional Radiology for your procedure. Your appointment information is listed below:  ?  Appointment Date:?04/14/2020  Appointment Time:?11AM  Arrival Time:?10AM  Location:   ?  ? Main Campus:?[address] [place], [place]  Parking: [address] Parking Garage  ?  ?  ?  INTERVENTIONAL RADIOLOGY  PRE-PROCEDURE INSTRUCTIONS LOCAL  ?  You are scheduled for a procedure in Interventional Radiology. ?Please follow these instructions and any direction from your Primary Care/Managing Physician. ?If you have questions about your procedure or need to reschedule please call [phone].  ?  ?  Medication Instructions: ?Continue scheduled medication. ??  ?  Diet Instructions: Maintain regular diet with no restrictions.   ?  Day of Exam Instructions:  1. Bathe or shower with an antibacterial soap prior to your appointment.  2. Bring a list of your current medications and the dosages.  3. Wear comfortable clothing and leave valuables at home.  4. Arrive (1) hour prior to your appointment. ?This time will be spent registering, interviewing, assessing, educating, and preparing you for the test.  ? You will be with [place] anywhere from 30 minutes to 2 hours after your exam depending on your procedure.  5. Depending on the procedure and as instructed by the nurse a responsible adult may be required to drive you home (no Benedetto Goad, taxis or buses are allowed). If a driver is required and you do not have one ?we will be unable to perform your procedure.   6. The nurse will give you discharge instructions about your care and activities after the procedure.

## 2020-04-12 ENCOUNTER — Encounter: Admit: 2020-04-12 | Discharge: 2020-04-12 | Payer: MEDICARE

## 2020-04-12 ENCOUNTER — Ambulatory Visit: Admit: 2020-04-12 | Discharge: 2020-04-12 | Payer: MEDICARE

## 2020-04-12 DIAGNOSIS — K760 Fatty (change of) liver, not elsewhere classified: Secondary | ICD-10-CM

## 2020-04-12 DIAGNOSIS — R945 Abnormal results of liver function studies: Secondary | ICD-10-CM

## 2020-04-12 DIAGNOSIS — K31819 Angiodysplasia of stomach and duodenum without bleeding: Secondary | ICD-10-CM

## 2020-04-12 DIAGNOSIS — D649 Anemia, unspecified: Secondary | ICD-10-CM

## 2020-04-12 LAB — COMPREHENSIVE METABOLIC PANEL
Lab: 1.1 mg/dL — ABNORMAL HIGH (ref 0.4–1.00)
Lab: 1.2 mg/dL — ABNORMAL HIGH (ref 0.3–1.2)
Lab: 106 MMOL/L — ABNORMAL LOW (ref 98–110)
Lab: 123 mg/dL — ABNORMAL HIGH (ref 70–100)
Lab: 136 MMOL/L — ABNORMAL LOW (ref >60)
Lab: 161 U/L — ABNORMAL HIGH (ref 25–110)
Lab: 22 U/L — ABNORMAL HIGH (ref 7–56)
Lab: 23 MMOL/L (ref 21–30)
Lab: 3 g/dL — ABNORMAL LOW (ref 3.5–5.0)
Lab: 34 mg/dL — ABNORMAL HIGH (ref 7–25)
Lab: 4.6 MMOL/L — ABNORMAL LOW (ref 3.5–5.1)
Lab: 41 U/L — ABNORMAL HIGH (ref 7–40)
Lab: 5.7 g/dL — ABNORMAL LOW (ref 6.0–8.0)
Lab: 50 mL/min — ABNORMAL LOW (ref >60)
Lab: 7 K/UL (ref 3–12)
Lab: 8.6 mg/dL (ref 8.5–10.6)
Lab: >60 mL/min (ref >60)

## 2020-04-12 LAB — CBC AND DIFF
Lab: 2.5 M/UL — ABNORMAL LOW (ref 4.0–5.0)
Lab: 21 % — ABNORMAL LOW (ref >60)
Lab: 9.4 K/UL (ref 4.5–11.0)

## 2020-04-12 LAB — PROTIME INR (PT): Lab: 1.4 g/dL — ABNORMAL HIGH (ref 0.8–1.2)

## 2020-04-12 NOTE — Telephone Encounter
LVM to remind patient that labs are due today. RN notes labs have not been completed. Patient advised to have done at [facility].

## 2020-04-13 ENCOUNTER — Encounter: Admit: 2020-04-13 | Discharge: 2020-04-13 | Payer: MEDICARE

## 2020-04-13 ENCOUNTER — Ambulatory Visit: Admit: 2020-04-13 | Discharge: 2020-04-14 | Payer: MEDICARE

## 2020-04-13 DIAGNOSIS — D649 Anemia, unspecified: Secondary | ICD-10-CM

## 2020-04-13 NOTE — Telephone Encounter
labs reviewed, hgb 6.3, blood transfusion ordered and message sent to infusion clinic for appt. patient notified staff will be in touch with her tomorrow morning for scheduling, she v/u.

## 2020-04-13 NOTE — Patient Instructions
[facility]  OUTPATIENT POST TRANSFUSION INSTRUCTIONS     During your transfusion you were monitored by nursing staff for signs of a transfusion reaction, however, you should continue to observe for the following symptoms after you have been dismissed from the clinic:     FEVER OF 100.5 OR HIGHER  CHILLS  NAUSEA OR VOMITING  FEELING FAINT OR DIZZY  SHORTNESS OF BREATH  DARK OR RED COLORED URINE  CHEST OR BACK PAIN  SHOCK OR LOSS OF CONSCIOUSNESS  YELLOWING OF THE EYES OR SKIN     If you or your caregiver notice any of these symptoms, contact your ordering physician immediately and go to the nearest Emergency Department for treatment. When you arrive at the Emergency Department notify them that you recently received a blood transfusion. *For more detailed signs and symptoms of a transfusion reaction, please refer to the [place] education information below.          When You Need a Blood Transfusion (Adult)  A blood transfusion may be done when you have lost blood because of an injury or during surgery. It can also be done because of diseases or conditions that affect the blood. Blood is made up of several different parts (blood products). You may receive some or all of these blood products during a transfusion. Blood for transfusion is usually donated from another person (donor). Strict measures are taken to make sure that donated blood is safe before it's given to you. This sheet helps you understand how a blood transfusion is done. Your healthcare provider will discuss your condition with you and answer your questions.   The parts of blood  Blood can be broken down into different parts that perform special roles in the body. These parts include:  ? Red blood cells, which carry oxygen throughout the body.  ? Platelets, which help stop bleeding.  ? Plasma (the liquid part of blood), which carries red blood cells and platelets throughout the body. Plasma also helps platelets in stopping bleeding. Where does donated blood come from?  ? Volunteer donors. These are people who donate their blood to help others in need of blood. Blood donation can take place at several places, including a hospital, blood bank, or during a blood drive.  ? Directed donation. If you need a blood transfusion during a planned surgery, family and friends can have their blood tested for compatibility and donate blood for you before the surgery. This needs to be done at least 7 day(s) in advance. This is because the blood must be tested for safety.  ? Autologous donation. This is also called self-donation. For planned surgery, you can donate your own blood starting up to 6 weeks before surgery.  Are blood transfusions safe?  Donated blood is tested and processed to make sure that the blood is safe:  ? The health and medical history of each donor is carefully screened. If a person is considered high-risk for infection or problems, he or she isn't accepted as a blood donor.  ? Donated blood is tested for infections such as hepatitis, syphilis, and HIV (the virus that causes AIDS). If the tested blood is found to be unsafe, it's destroyed.  ? Blood is divided into four types: A, B, AB, and O. Blood also has Rh types: positive (+) and negative (-). You can only receive blood products that are compatible with (match) your blood type. A sample of your blood is tested for compatibility with donated blood. This is  done before blood products are prepared for a transfusion.  How is a blood transfusion done?  A blood transfusion takes place in a blood center, infusion center, hospital room, or operating room. Your healthcare provider will discuss the blood transfusion with you before it's done. You'll need to give permission for the blood transfusion by signing a consent form.  ? Two healthcare providers confirm your identity. They also confirm that they have the correct blood product(s) for you.  ? An intravenous (IV) line is placed in a vein if you do not already have an IV.  ? The blood product comes in a plastic bag that is hung on an IV pole. The blood product flows from the bag into your IV line. The IV line may be connected to a pump, which controls the transfusion rate. You may receive more than one kind of blood product through the IV.  ? Your vital signs (blood pressure, heart rate, respiratory rate, and temperature) are checked throughout the transfusion. This is to make sure you are not having a reaction to the blood product.  ? The IV line may be removed once the transfusion is complete.  Possible risks and complications of blood transfusions  Most transfusions are problem free. In some cases, reactions occur. These can happen within seconds to minutes during the transfusion or a week to a few months after the transfusion. Call your doctor or nurse right away if you have any of the signs or symptoms in the table below during or after a transfusion:  Reaction Timing Symptoms   Allergic reaction (mild) ? Within seconds to minutes during the transfusion  ? Up to 24 hours after the transfusion Hives or red welts on the skin, mild itching, rash, localized swelling, flushing (red face), wheezing, shortness of breath, or stridor (high-pitched noise or sound)   Anaphylactic reaction ? Within seconds to minutes during the transfusion  ? Up to 24 hours after the transfusion Shortness of breath, flushing (red face), wheezing, labored (working hard) breathing, low blood pressure, localized swelling, chest tightness, or cramps   Febrile nonhemolytic reaction ? Within minutes to hours during the transfusion  ? Within a few hours to 24 hours after the transfusion Fever (increase of 1? C or higher), chills, flushing (red face), nausea, headache, minor discomfort, or mild shortness of breath   Acute immune hemolytic reaction ? Within minutes during the transfusion  ? Up to 24 hours after the transfusion Fever, red or brown urine, back pain, fast heart rate (tachycardia), abdominal pain, low blood pressure, feeling anxious, chills, chest pain, nausea, or fainting spells   Transfusion-related acute lung injury (TRALI) ? Within 1 to 2 hours during the transfusion  ? Up to 6 hours after the transfusion Shortness of breath, trouble breathing, low blood pressure, fever, pulmonary edema   Transfusion-associated circulatory overload ? Near the end of the transfusion  ? Within 6 hours after the transfusion Shortness of breath, fast heart rate (tachycardia), problems breathing when lying on back, abnormal blood pressure   Post-transfusion purpura (PUP) ? Within 1 week  ? Up to 48 days after the transfusion Purple spots on skin; nose bleed; bleeding from the urinary tract, abdomen, colon, or rectum; fever; or chills     Delayed transfusion-related acute lung injury (TRALI) ? Within 72 hours (3 days) after the transfusion Sudden onset of respiratory distress or trouble breathing   Delayed hemolytic reaction ? Within 3 to 7 days  ? Up to weeks after the  transfusion Low-grade fever, mild jaundice (yellowing of the skin and whites of the eyes), decrease in hematocrit, chills, chest pain, back pain, nausea   ? 2000-2019 [employer], [place]. [address] [place], [place] [address]. All rights reserved. This information is not intended as a substitute for professional medical care. Always follow your healthcare professional's instructions.

## 2020-04-14 ENCOUNTER — Ambulatory Visit: Admit: 2020-04-14 | Discharge: 2020-04-14 | Payer: MEDICARE

## 2020-04-14 ENCOUNTER — Encounter: Admit: 2020-04-14 | Discharge: 2020-04-14 | Payer: MEDICARE

## 2020-04-14 DIAGNOSIS — E669 Obesity, unspecified: Secondary | ICD-10-CM

## 2020-04-14 DIAGNOSIS — I10 Essential (primary) hypertension: Secondary | ICD-10-CM

## 2020-04-14 DIAGNOSIS — K760 Fatty (change of) liver, not elsewhere classified: Secondary | ICD-10-CM

## 2020-04-14 DIAGNOSIS — Z01810 Encounter for preprocedural cardiovascular examination: Secondary | ICD-10-CM

## 2020-04-14 DIAGNOSIS — J329 Chronic sinusitis, unspecified: Secondary | ICD-10-CM

## 2020-04-14 DIAGNOSIS — K7460 Unspecified cirrhosis of liver: Secondary | ICD-10-CM

## 2020-04-14 DIAGNOSIS — M25532 Pain in left wrist: Secondary | ICD-10-CM

## 2020-04-14 DIAGNOSIS — R0600 Dyspnea, unspecified: Secondary | ICD-10-CM

## 2020-04-14 DIAGNOSIS — K31819 Angiodysplasia of stomach and duodenum without bleeding: Secondary | ICD-10-CM

## 2020-04-14 DIAGNOSIS — E119 Type 2 diabetes mellitus without complications: Secondary | ICD-10-CM

## 2020-04-14 DIAGNOSIS — D500 Iron deficiency anemia secondary to blood loss (chronic): Secondary | ICD-10-CM

## 2020-04-14 DIAGNOSIS — K5730 Diverticulosis of large intestine without perforation or abscess without bleeding: Secondary | ICD-10-CM

## 2020-04-14 DIAGNOSIS — I729 Aneurysm of unspecified site: Secondary | ICD-10-CM

## 2020-04-14 DIAGNOSIS — K7469 Other cirrhosis of liver: Secondary | ICD-10-CM

## 2020-04-14 LAB — GRAM STAIN

## 2020-04-14 MED ORDER — ALBUMIN, HUMAN 25 % IV SOLP
0 refills | Status: CP
Start: 2020-04-14 — End: ?
  Administered 2020-04-14: 16:00:00 37.5 g via INTRAVENOUS

## 2020-04-14 NOTE — Progress Notes
Date of Service: 04/14/2020    Subjective:                History of Present Illness    Kristin Moody is a 64 y.o. female.  She returns today for proximal fifth metacarpal fracture.  She has been in a short arm cast since we saw her last week.  She notes improvement in overall pain though it is still sore.  She feels like her skin is healing though slowly.  She had several abrasions after her fall.  1 is located under the volar aspect of the Exos splint proximally.  No other questions today.       Review of Systems   Musculoskeletal: Positive for arthralgias.   Neurological: Negative for weakness and numbness.   All other systems reviewed and are negative.        Objective:         ? cetirizine (ZYRTEC) 10 mg tablet Take 10 mg by mouth daily as needed for Allergy symptoms.   ? ciprofloxacin (CIPRO) 500 mg tablet Take one tablet by mouth daily.   ? duloxetine DR (CYMBALTA) 60 mg capsule Take 60 mg by mouth daily.   ? midodrine (PROAMITINE) 10 mg tablet Take one tablet by mouth three times daily.   ? pantoprazole DR (PROTONIX) 40 mg tablet Take one tablet by mouth twice daily.   ? polyethylene glycol 3350 (MIRALAX) 17 g packet Take one packet by mouth twice daily.   ? promethazine (PHENERGAN) 25 mg tablet Take 25 mg by mouth every 4 hours as needed. Just uses it with Tramadol   ? rifAXIMin (XIFAXAN) 550 mg tablet Take one tablet by mouth twice daily.   ? zinc sulfate 220 mg (50 mg elemental zinc) capsule Take one capsule by mouth daily.     Vitals:    04/14/20 1308   BP: 102/41   Pulse: 102   SpO2: 100%   Weight: 72.6 kg (160 lb)   Height: 160 cm (63)   PainSc: Two     Body mass index is 28.34 kg/m?Marland Kitchen     Physical Exam  Constitutional:       General: She is not in acute distress.     Appearance: She is well-developed. She is not diaphoretic.   HENT:      Head: Normocephalic and atraumatic.   Eyes:      Conjunctiva/sclera: Conjunctivae normal.   Pulmonary:      Effort: Pulmonary effort is normal. Musculoskeletal:      Cervical back: Normal range of motion.   Skin:     General: Skin is warm and dry.      Findings: No erythema.   Neurological:      Mental Status: She is alert and oriented to person, place, and time.       Left Hand Exam     Tenderness   Left hand tenderness location: Tenderness at the base of the fifth metacarpal.     Range of Motion   Wrist   Extension: normal   Flexion: normal   Hand   MP Little: normal   PIP Little: normal   DIP Little: normal     Muscle Strength   Wrist extension: 4/5   Wrist flexion: 4/5   Grip:  4/5     Other   Erythema: absent  Scars: absent  Sensation: normal  Pulse: present    Comments:  Wound located over the volar aspect of the proximal forearm continues to  heal.                 Assessment and Plan:       Kristin Moody was seen today for follow up.    Diagnoses and all orders for this visit:    Left wrist pain  -No significant displacement of the oblique fracture at the base of the fifth metacarpal.  Possible slight interval healing.  -Pain likely secondary to left fifth metacarpal fracture.  The nature of this pathology was discussed with the patient.  -Imaging: No further imaging today.  We will reimage in 1 week.  -Lifestyle modifications: Continue Exos splint unless bathing.  Recommended leaving out for 30 to 45 minutes after bathing to let dry.  The arm should be supported and not significantly used during this time.  We will attempt to let the skin dry well to avoid further skin issues as she is trying to heal from her previous fall.  -Medication: Continue home medications.  -Therapy: No specific therapy today.  -Intervention: No further intervention at this time.  -Activity: As tolerated.  -RTO in 1 weeks or sooner PRN increased pain.  -The pt understands the above assessment and plan and has no further questions             Lynnda Child, MD    Parts of this clinic note were completed using a dragon voice recognition software system. Please excuse any misspellings or grammatical errors.  If you need clarification please contact my office.

## 2020-04-14 NOTE — Patient Instructions
It was our pleasure to see you today.  If you need anything further contact our office at [phone]  Luke Thompson MD & Kasondra Junod RN at [facility].

## 2020-04-14 NOTE — Patient Instructions
PARACENTESIS   A paracentesis is the removal of an abnormal buildup of fluid in your abdominal cavity. This fluid buildup is called ascites and may be caused by conditions such as liver disease, heart failure, or cancer. During this procedure, a needle is inserted into your abdomen to drain the fluid. The fluid may then be sent to the lab for testing if medically indicated. Removal of the fluid may also relieve belly pressure and shortness of breath caused by the ascites.?  POST-PROCEDURE PAIN:   ? Pain control following your procedure is a priority for both you and your Physicians.  ? Some soreness or tenderness at the site is to be expected for several days. We recommend taking over the counter analgesics to help relieve this pain.  ? Alternative methods for pain relief include but not limited to heat or cold compress, relaxation techniques, rest, and changing of positions.  ? If pain continues after 5-7 days or you have severe pain not relieved by medication, please contact us as directed below.?  POST-PROCEDURE ACTIVITY:   ? A responsible adult must drive you home.  ? If you receive sedation, narcotic pain medication or anesthesia for the procedure, you should not drive or operate heavy machinery or do anything that requires concentration for at least 24 hours after procedure completion.  ? It is recommended that a responsible adult be with you until morning.?  POST-PROCEDURE SITE CARE:   ? You will have a small bandage over the procedure site. Keep this dry.  ? You may remove it in 24 hours.  ? You may shower in 24 hours, after removing the bandage.  ? A dry gauze bandage may be reapplied as necessary to protect your clothing as the site may sometimes leak for several days after the procedure.  ? Do not submerge the procedure site for 1 week (no bathtub, swimming, hot tub, etc.)  ? Do not use ointments, creams or powders on the puncture site.  ? Be sure your hands are clean when touching near the site.? DIET/MEDICATIONS:   ? You may resume your previous diet after the procedure.  ? If you receive sedation or narcotic pain medications, avoid any foods or beverages containing alcohol for at least 24 hours after the procedure.  ? Please see the Medication Reconciliation sheet for instructions regarding resuming your home medications.?  CALL THE DOCTOR IF:   ? Bright red blood soaks the bandage.  ? You have pain not relieved by medication. Some soreness at the site is to be expected.  ? You have signs of infection such as: fever greater than 101F, chills, redness, warmth, swelling, drainage or pus from the puncture site.  For any of the above symptoms or for problems or concerns related to the procedure performed at the [place] Location, call [phone] Monday-Friday from 7-5p. After-hours and weekends, please call [phone] and ask for the Interventional Radiology Resident on-call.   You or your caregiver should call 911 for any severe symptoms such as excessive bleeding, severe dizziness, trouble breathing or loss of consciousness.   ?  ?

## 2020-04-14 NOTE — Progress Notes
Interventional Radiology Outpatient Scheduling Checklist      1.  Name of Procedure(s):  Paracentesis       2.  Date of Procedure:   6/4, 6/11, 6/18, 6/25, 05/20/20      3.  Arrival Time:   1000      4.  Procedure Time:  1100      5.  Correct Procedural Room Assignment:  BH 7      6.  Blood Thinners Triaged and instructed per protocol: Y/N/NA:  NA  Confirmed accurate instructions sent to patient: Y/N:  NA       7.  Procedure Order Verified: Y/N:  Yes      9.  Patient instructed to have a driver: Y/N/NA:  Yes    10.  Patient instructed on NPO status: Y/N/NA:  No dietary restrictions  Confirmed accurate instructions sent to patient: Y/N:  Yes    11.  Specimen needed: Y/N/NA:  Yes   Verified Order placed: Y/N:  Yes    12.  Allergies Verified:  Y/N:  Yes    13.  Is there an Iodine Allergy: Y/N:  No  Does the Procedure Require contrast: Y/N:  No  If so, was the IR- Contrast Allergy Pre-Procedure Medication protocol ordered: Y/NA:  NA    14.  Does the patient have labs according to IR Pre-procedure Laboratory Parameter policy: Y/N/NA:  Yes  If No, was the patient instructed to obtain labs prior to procedure: Y/N/NA:  NA     15.  Will the patient need to be admitted or have a possible admission: Y/N:  No  If yes, confirmed accurate instructions sent to patient: Y/N/NA:  NA     16.  Patient States Understanding: Y/N:  Yes    17.  History of OSA:  Y/N:  No  If yes, confirm request to bring CPAP sent to patient: Y/N/NA:  NA    18. Patient declines electronic procedure instructions: Y/N:  No

## 2020-04-14 NOTE — H&P (View-Only)
Pre Procedure History and Physical/Sedation Plan-OP    Procedure Date: 04/14/2020     Planned Procedure(s):  US guided paracentesis     Indication for exam:  Refractory ascites   __________________________________________________________________    Chief Complaint:  See above    History of Present Illness: Kristin Moody is a 64 y.o. female with hx as below that presents to IR today for paracentesis. Patient reports abdominal pain and back pain on exam today.     Patient Active Problem List    Diagnosis Date Noted   ? 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 Hypotension 02/10/2020   ? 'Light-for-dates' infant with signs of fetal malnutrition 01/22/2020   ? 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 Anemia 09/28/2019   ? Encephalopathy 08/12/2019   ? Spasticity 08/10/2019   ? Hyperreflexia 08/10/2019   ? 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 Right leg weakness 05/14/2019   ? 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 Iron (Fe) deficiency anemia 01/19/2019   ? Preop cardiovascular exam 01/07/2019   ? Pre-transplant evaluation for liver transplant 01/06/2019   ? Cirrhosis (HCC) 12/16/2018   ? Acute on chronic anemia 12/16/2018   ? Lactic acidosis 12/16/2018   ? GI bleed 10/24/2018   ? Portal hypertension (HCC) 08/28/2018   ? Confusion 08/27/2018   ? Dyspnea 08/27/2018   ? Chronic abdominal pain 08/27/2018   ? 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 Acute on chronic blood loss anemia 08/27/2018   ? Iron deficiency anemia 04/13/2018   ? Pneumonia due to infectious organism 04/13/2018   ? Melena 04/10/2018   ? Esophageal varices without bleeding (HCC) 02/25/2018   ? Cirrhosis of liver without ascites (HCC) 02/25/2018   ? 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 Lower abdominal pain 10/24/2014   ? Chest discomfort 09/21/2014   ? Essential hypertension 08/13/2012   ? Abnormal liver function tests 05/13/2011   ? Fatty liver disease, nonalcoholic 05/13/2011   ? Obesity (BMI 30-39.9) 05/13/2011   ? Slow transit constipation 05/13/2011     Medical History:   Diagnosis Date   ? Aneurysm (HCC)    ? 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 Diverticulosis of colon     descending and sigmoid colon   ? Dyspnea    ? Essential hypertension 08/13/2012   ? Fatty infiltration of liver    ? HTN (hypertension)    ? Obesity (BMI 30-39.9) 05/13/2011   ? Preop cardiovascular exam 01/07/2019   ? Sinus infection    ? Type 2 diabetes mellitus (HCC) 09/21/2014      Surgical History:   Procedure Laterality Date   ? HYSTERECTOMY  1994   ? UPPER GASTROINTESTINAL ENDOSCOPY  2009   ? COLONOSCOPY  2009   ? CYSTOCELE REPAIR  2009    with endocele repair   ? RECTOCELE REPAIR  03/2009   ? LIVER BIOPSY  07/17/2010   ? COLONOSCOPY N/A 11/08/2017    Performed by Dawna Part, MD at [facility] ENDO   ? ESOPHAGOGASTRODUODENOSCOPY N/A 11/08/2017    Performed by Dawna Part, MD at [facility] ENDO   ? ESOPHAGOGASTRODUODENOSCOPY WITH BIOPSY - FLEXIBLE  11/08/2017    Performed by Dawna Part, MD at [facility] ENDO   ? COLONOSCOPY WITH HOT BIOPSY FORCEPS REMOVAL TUMOR/ POLYP/ OTHER LESION  11/08/2017    Performed by Earle Gell  M, MD at [facility] ENDO   ? ESOPHAGOGASTRODUODENOSCOPY WITH BAND LIGATION ESOPHAGEAL/ GASTRIC VARICES - FLEXIBLE N/A 04/10/2018    Performed by Normajean Baxter, MD at [facility] ENDO   ? ESOPHAGOGASTRODUODENOSCOPY WITH BIOPSY - FLEXIBLE N/A 04/10/2018    Performed by Normajean Baxter, MD at [facility] ENDO   ? ESOPHAGOGASTRODUODENOSCOPY WITH CONTROL OF BLEEDING - FLEXIBLE N/A 04/25/2018    Performed by Jolee Ewing, MD at [facility] ENDO   ? ESOPHAGOGASTRODUODENOSCOPY WITH BIOPSY - FLEXIBLE with push enteroscopy N/A 08/28/2018    Performed by Celesta Gentile, MD at [facility] ENDO   ? EGD N/A 10/27/2018    Performed by Eliott Nine, MD at [facility] ENDO   ? ESOPHAGOGASTRODUODENOSCOPY WITH SNARE REMOVAL TUMOR/ POLYP/ OTHER LESION - FLEXIBLE N/A 10/27/2018    Performed by Eliott Nine, MD at [facility] ENDO   ? ESOPHAGOGASTRODUODENOSCOPY WITH BIOPSY - FLEXIBLE N/A 12/16/2018    Performed by Buckles, Vinnie Level, MD at [facility] ENDO   ? ESOPHAGOGASTRODUODENOSCOPY WITH CONTROL OF BLEEDING - FLEXIBLE N/A 12/16/2018    Performed by Buckles, Vinnie Level, MD at [facility] ENDO   ? ESOPHAGOGASTRODUODENOSCOPY WITH SPECIMEN COLLECTION BY BRUSHING/ WASHING N/A 01/22/2019    Performed by Veneta Penton, MD at [facility] ENDO   ? ESOPHAGOGASTRODUODENOSCOPY WITH DILATION ESOPHAGUS WITH BALLOON 30 MM OR GREATER - FLEXIBLE N/A 01/22/2019    Performed by Veneta Penton, MD at [facility] ENDO   ? ESOPHAGOGASTRODUODENOSCOPY WITH BIOPSY - FLEXIBLE N/A 01/22/2019    Performed by Veneta Penton, MD at [facility] ENDO   ? ESOPHAGOGASTRODUODENOSCOPY WITH BIOPSY - FLEXIBLE N/A 06/19/2019    Performed by Dawna Part, MD at [facility] ENDO   ? ESOPHAGOGASTRODUODENOSCOPY [WITH APC]  WITH SPECIMEN COLLECTION BY BRUSHING/ WASHING N/A 08/07/2019    Performed by Dawna Part, MD at [facility] ENDO   ? ESOPHAGOGASTRODUODENOSCOPY WITH SPECIMEN COLLECTION BY BRUSHING/ WASHING N/A 09/10/2019    Performed by Buckles, Vinnie Level, MD at [facility] ENDO   ? ESOPHAGOGASTRODUODENOSCOPY WITH CONTROL OF BLEEDING - FLEXIBLE N/A 09/10/2019    Performed by Buckles, Vinnie Level, MD at [facility] ENDO   ? ESOPHAGOGASTRODUODENOSCOPY WITH CONTROL OF BLEEDING - FLEXIBLE N/A 02/12/2020    Performed by Lenor Derrick, MD at [facility] ENDO   ? ESOPHAGOGASTRODUODENOSCOPY WITH BIOPSY - FLEXIBLE N/A 02/26/2020    Performed by Buckles, Vinnie Level, MD at [facility] ENDO   ? CHOLECYSTECTOMY     ? COLONOSCOPY     ? ESOPHAGOGASTRIC FUNDOPLICATION  2003, 2004    laparoscopic   ? ESOPHAGOGASTRIC FUNDOPLICATION     ? HX HYSTERECTOMY     ? PR ESOPHAGOSCOPY FLEXIBLE TRANSORAL DIAGNOSTIC        Medications Prior to Admission   Medication Sig Dispense Refill Last Dose   ? cetirizine (ZYRTEC) 10 mg tablet Take 10 mg by mouth daily as needed for Allergy symptoms.      ? ciprofloxacin (CIPRO) 500 mg tablet Take one tablet by mouth daily. 30 tablet 1    ? duloxetine DR (CYMBALTA) 60 mg capsule Take 60 mg by mouth daily.      ? midodrine (PROAMITINE) 10 mg tablet Take one tablet by mouth three times daily. 90 tablet 1    ? pantoprazole DR (PROTONIX) 40 mg tablet Take one tablet by mouth twice daily. 60 tablet 3    ? polyethylene glycol 3350 (MIRALAX) 17 g packet Take one packet by mouth twice daily.      ? promethazine (PHENERGAN) 25 mg tablet Take 25 mg by mouth  every 4 hours as needed. Just uses it with Tramadol      ? rifAXIMin (XIFAXAN) 550 mg tablet Take one tablet by mouth twice daily. 180 tablet 3    ? zinc sulfate 220 mg (50 mg elemental zinc) capsule Take one capsule by mouth daily. 90 capsule 3      Allergies   Allergen Reactions   ? Tegaderm BLISTERS   ? Adhesive Tape (Rosins) RASH   ? Sulfa (Sulfonamide Antibiotics) RASH   ? Codeine NAUSEA AND VOMITING   ? Morphine SEE COMMENTS     Pt reports burns her veins   ? Penicillin G SEE COMMENTS     Throat issues, blisters in throat    ? Tramadol NAUSEA ONLY and ITCHING     Takes this medication regularly        Social History:   Social History     Tobacco Use   ? Smoking status: Never Smoker   ? Smokeless tobacco: Never Used   Substance Use Topics   ? Alcohol use: No      Family History   Problem Relation Age of Onset   ? Other Mother    ? Kidney Failure Mother    ? Osteoporosis Mother    ? Cancer Father         esophageal   ? Liver Disease Sister         endstage, s/p liver transplant   ? Cancer Brother         tonsil, liver    ? Hip Fracture Neg Hx         Review of Systems  Constitutional: negative for fevers and chills  Respiratory: negative for cough or dyspnea  Gastrointestinal: negative for nausea, vomiting and abdominal pain    Previous Anesthetic/Sedation History:  Denies adverse events r/t sedation/anesthesia.      Code Status: Prior    Physical Exam:  Vital Signs: Last Filed In 24 Hours Vital Signs: 24 Hour Range   BP: 102/41 (05/26 1514)  Temp: 36.5 ?C (97.7 ?F) (05/27 0940)  Pulse: 95 (05/27 0940)  Respirations: 28 PER MINUTE (05/27 0940)  SpO2: 100 % (05/27 0940)  SpO2 Pulse: 93 (05/27 0940) BP: (101-112)/(39-56)   Temp:  [36.5 ?C (97.7 ?F)-37 ?C (98.6 ?F)]   Pulse:  [90-95]   Respirations:  [18 PER MINUTE-28 PER MINUTE]   SpO2:  [97 %-100 %]    Intensity Pain Scale (Self Report): 8 (04/14/20 0940)        General appearance: alert and no distress  Neurologic: Grossly normal, at baseline  Lungs: Nonlabored with normal effort  Abdomen: soft, non-tender.     Airway:  airway assessment performed  Mallampati III (soft palate, base of uvula visible)   Anesthesia Classification:  ASA III (A patient with a severe systemic disease that limits activity, but is not incapacitating)  Pre procedure anxiolysis plan: Midazolam  Sedation/Medication Plan: Fentanyl, Lidocaine and Midazolam  Personal history of sedation complications: Denies adverse event.   Family history of sedation complications: Denies adverse event.   Medications for Reversal: Naloxone and Flumazenil  Discussion/Reviews:  Physician has discussed risks and alternatives of this type of sedation and above planned procedures with patient  NPO Status: Acceptable  Pregnancy Status: Not Pregnant        Lab/Radiology/Other Diagnostic Tests:  Labs:  Pertinent labs reviewed           Velora Mediate, APRN-NP  Pager [phone]

## 2020-04-14 NOTE — Progress Notes
OK to be d/c'd home per MD order protocol. Pt is awake and alert. Denies pain. O2 sats >97% on RA. NSR on telemetry. Tolerating fluids well. Dressing is C/D/I. Pt verbalized understanding of d/c instructions. Advised to call the MD for any questions or concerns or return to ER if fever or significant pain develops. Family to drive pt home. Pt will have IV removed and will be transported downstairs to lobby via wheelchair with all belongings.

## 2020-04-14 NOTE — Other
Immediate Post Procedure Note    Date:  04/14/2020                                         Attending Physician:   Dr. Ree Kida   Performing Provider:  Gilmore Laroche, MD    Consent:  Consent obtained from patient.  Time out performed: Consent obtained, correct patient verified, correct procedure verified, correct site verified, patient marked as necessary.  Pre/Post Procedure Diagnosis:  Ascites   Indications:  Ascites       Procedure(s):  Paracentesis   Findings:  Successful paracentesis      Estimated Blood Loss:  None/Negligible  Specimen(s) Removed/Disposition:  Yes, sent to pathology  Complications: None  Patient Tolerated Procedure: Well  Post-Procedure Condition:  stable    Gilmore Laroche, MD  Pager

## 2020-04-14 NOTE — Progress Notes
Procedural education done with pt who verbalized understanding of procedure. All questions answered. Pt in position of comfort at this time. Safety precautions in place. Will continue to monitor.

## 2020-04-14 NOTE — Patient Education
Dear Ms. Roel,         Thank you for choosing [facility] Interventional Radiology for your procedure. Your appointment information is listed below:    Appointment Dates: 6/4, 6/11, 6/18, 6/25, 7/2  Appointment Time: 11:00am  Arrival Time:10:00am  Location:     ? Main Campus: [address] [place], [place]  [address]  Parking: [address]age    INTERVENTIONAL RADIOLOGY  PRE-PROCEDURE INSTRUCTIONS     You are scheduled for a procedure in Interventional Radiology.  Please follow these instructions and any direction from your Primary Care/Managing Physician.  If you have questions about your procedure or need to reschedule please call [phone].    Medication Instructions:   You may take the following medications with a small sip of water:  Continue your regular medications as directed.     Diet Instructions: No dietary restrictions   Day of Exam Instructions:  1. Bathe or shower with an antibacterial soap prior to your appointment.  2. Bring a list of your current medications and the dosages.  3. Wear comfortable clothing and leave valuables at home.  4. Arrive 1 hour prior to your appointment.  This time will be spent registering, interviewing, assessing, educating and preparing you for the test.  ? You will be with [place] anywhere from 30 minutes to 6 hours after your exam depending on your procedure.    Interventional Radiology Team  Perioperative and Procedural Scheduling Department  [facility]  Ph: [phone]

## 2020-04-15 ENCOUNTER — Encounter: Admit: 2020-04-15 | Discharge: 2020-04-15 | Payer: MEDICARE

## 2020-04-15 DIAGNOSIS — D649 Anemia, unspecified: Secondary | ICD-10-CM

## 2020-04-15 DIAGNOSIS — D500 Iron deficiency anemia secondary to blood loss (chronic): Secondary | ICD-10-CM

## 2020-04-22 ENCOUNTER — Encounter: Admit: 2020-04-22 | Discharge: 2020-04-22 | Payer: MEDICARE

## 2020-04-22 ENCOUNTER — Ambulatory Visit: Admit: 2020-04-22 | Discharge: 2020-04-22 | Payer: MEDICARE

## 2020-04-22 DIAGNOSIS — I10 Essential (primary) hypertension: Secondary | ICD-10-CM

## 2020-04-22 DIAGNOSIS — K31819 Angiodysplasia of stomach and duodenum without bleeding: Secondary | ICD-10-CM

## 2020-04-22 DIAGNOSIS — J329 Chronic sinusitis, unspecified: Secondary | ICD-10-CM

## 2020-04-22 DIAGNOSIS — E669 Obesity, unspecified: Secondary | ICD-10-CM

## 2020-04-22 DIAGNOSIS — K7460 Unspecified cirrhosis of liver: Secondary | ICD-10-CM

## 2020-04-22 DIAGNOSIS — I729 Aneurysm of unspecified site: Secondary | ICD-10-CM

## 2020-04-22 DIAGNOSIS — R0600 Dyspnea, unspecified: Secondary | ICD-10-CM

## 2020-04-22 DIAGNOSIS — Z01810 Encounter for preprocedural cardiovascular examination: Secondary | ICD-10-CM

## 2020-04-22 DIAGNOSIS — K760 Fatty (change of) liver, not elsewhere classified: Secondary | ICD-10-CM

## 2020-04-22 DIAGNOSIS — K5730 Diverticulosis of large intestine without perforation or abscess without bleeding: Secondary | ICD-10-CM

## 2020-04-22 DIAGNOSIS — M25532 Pain in left wrist: Secondary | ICD-10-CM

## 2020-04-22 DIAGNOSIS — D500 Iron deficiency anemia secondary to blood loss (chronic): Secondary | ICD-10-CM

## 2020-04-22 DIAGNOSIS — R945 Abnormal results of liver function studies: Secondary | ICD-10-CM

## 2020-04-22 DIAGNOSIS — E119 Type 2 diabetes mellitus without complications: Secondary | ICD-10-CM

## 2020-04-22 DIAGNOSIS — K7469 Other cirrhosis of liver: Secondary | ICD-10-CM

## 2020-04-22 DIAGNOSIS — S62347D Nondisplaced fracture of base of fifth metacarpal bone. left hand, subsequent encounter for fracture with routine healing: Secondary | ICD-10-CM

## 2020-04-22 LAB — CBC AND DIFF
Lab: 0 10*3/uL (ref 0–0.20)
Lab: 0.1 10*3/uL (ref 0–0.45)
Lab: 0.8 10*3/uL — ABNORMAL LOW (ref 1.0–4.8)
Lab: 0.9 10*3/uL — ABNORMAL HIGH (ref 0–0.80)
Lab: 1 % (ref 0–2)
Lab: 11 % — ABNORMAL LOW (ref 24–44)
Lab: 13 % — ABNORMAL HIGH (ref 4–12)
Lab: 18 % — ABNORMAL LOW (ref 36–45)
Lab: 2.4 M/UL — ABNORMAL LOW (ref 4.0–5.0)
Lab: 20 % — ABNORMAL HIGH (ref 11–15)
Lab: 3 % (ref 0–5)
Lab: 31 g/dL — ABNORMAL LOW (ref 32.0–36.0)
Lab: 5.4 10*3/uL (ref 1.8–7.0)
Lab: 5.9 g/dL — CL (ref 12.0–15.0)
Lab: 7.5 10*3/uL (ref 4.5–11.0)
Lab: 72 % (ref 41–77)
Lab: 75 FL — ABNORMAL LOW (ref 80–100)
Lab: 8.5 FL (ref 7–11)

## 2020-04-22 LAB — GRAM STAIN

## 2020-04-22 LAB — COMPREHENSIVE METABOLIC PANEL
Lab: 1.2 mg/dL — ABNORMAL HIGH (ref 0.4–1.00)
Lab: 1.3 mg/dL — ABNORMAL HIGH (ref 0.3–1.2)
Lab: 104 MMOL/L (ref 98–110)
Lab: 123 mg/dL — ABNORMAL HIGH (ref 70–100)
Lab: 134 MMOL/L — ABNORMAL LOW (ref 137–147)
Lab: 140 U/L — ABNORMAL HIGH (ref 25–110)
Lab: 2.9 g/dL — ABNORMAL LOW (ref 3.5–5.0)
Lab: 21 MMOL/L (ref 21–30)
Lab: 33 mg/dL — ABNORMAL HIGH (ref 7–25)
Lab: 35 U/L (ref 7–40)
Lab: 4.5 MMOL/L (ref 3.5–5.1)
Lab: 5.6 g/dL — ABNORMAL LOW (ref 6.0–8.0)
Lab: 8.4 mg/dL — ABNORMAL LOW (ref 8.5–10.6)

## 2020-04-22 LAB — PROTIME INR (PT): Lab: 1.5 K/UL — ABNORMAL HIGH (ref 0.8–1.2)

## 2020-04-22 MED ORDER — ALBUMIN, HUMAN 25 % IV SOLP
0 refills | Status: CP
Start: 2020-04-22 — End: ?

## 2020-04-22 NOTE — Other
Immediate Post Procedure Note    Date:  04/22/2020                                         Attending Physician:   Carlis Stable, MD  Performing Provider:  Nadara Mustard, DO    Consent:  Consent obtained from patient.  Time out performed: Consent obtained, correct patient verified, correct procedure verified, correct site verified, patient marked as necessary.  Pre/Post Procedure Diagnosis:  Diagnostic and Therapeutic Paracentesis  Indications:  Ascites      Procedure(s):  Diagnostic and therapeutic paracentesis  Findings:  Large volume ascites     Estimated Blood Loss:  None/Negligible  Specimen(s) Removed/Disposition:  Yes, sent to pathology  Complications: None  Patient Tolerated Procedure: Well  Post-Procedure Condition:  stable    Asante Ritacco, DO

## 2020-04-22 NOTE — H&P (View-Only)
IR Pre-Procedure History and Physical/Sedation Plan    Procedure Date: 04/22/2020     Planned Procedure(s):  Ultrasound-guided paracentesis     Indication:  Fluid testing; Therapeutic drainage  __________________________________________________________________    Chief Complaint:  Ascites    History of Present Illness: Kristin Moody is a 64 y.o. female with a history as listed below who presents today for procedure. Pt complains of abdominal pain and dyspnea on exertion she relates to ascites.    Patient Active Problem List    Diagnosis Date Noted   ? 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 Hypotension 02/10/2020   ? 'Light-for-dates' infant with signs of fetal malnutrition 01/22/2020   ? 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 Anemia 09/28/2019   ? Encephalopathy 08/12/2019   ? Spasticity 08/10/2019   ? Hyperreflexia 08/10/2019   ? 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 Right leg weakness 05/14/2019   ? 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 Iron (Fe) deficiency anemia 01/19/2019   ? Preop cardiovascular exam 01/07/2019   ? Pre-transplant evaluation for liver transplant 01/06/2019   ? Cirrhosis (HCC) 12/16/2018   ? Acute on chronic anemia 12/16/2018   ? Lactic acidosis 12/16/2018   ? GI bleed 10/24/2018   ? Portal hypertension (HCC) 08/28/2018   ? Confusion 08/27/2018   ? Dyspnea 08/27/2018   ? Chronic abdominal pain 08/27/2018   ? 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 Acute on chronic blood loss anemia 08/27/2018   ? Iron deficiency anemia 04/13/2018   ? Pneumonia due to infectious organism 04/13/2018   ? Melena 04/10/2018   ? Esophageal varices without bleeding (HCC) 02/25/2018   ? Cirrhosis of liver without ascites (HCC) 02/25/2018   ? 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 Lower abdominal pain 10/24/2014   ? Chest discomfort 09/21/2014   ? Essential hypertension 08/13/2012   ? Abnormal liver function tests 05/13/2011   ? Fatty liver disease, nonalcoholic 05/13/2011   ? Obesity (BMI 30-39.9) 05/13/2011   ? Slow transit constipation 05/13/2011     Medical History:   Diagnosis Date   ? Aneurysm (HCC)    ? 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 Diverticulosis of colon     descending and sigmoid colon   ? Dyspnea    ? Essential hypertension 08/13/2012   ? Fatty infiltration of liver    ? HTN (hypertension)    ? Obesity (BMI 30-39.9) 05/13/2011   ? Preop cardiovascular exam 01/07/2019   ? Sinus infection    ? Type 2 diabetes mellitus (HCC) 09/21/2014      Surgical History:   Procedure Laterality Date   ? HYSTERECTOMY  1994   ? UPPER GASTROINTESTINAL ENDOSCOPY  2009   ? COLONOSCOPY  2009   ? CYSTOCELE REPAIR  2009    with endocele repair   ? RECTOCELE REPAIR  03/2009   ? LIVER BIOPSY  07/17/2010   ? COLONOSCOPY N/A 11/08/2017    Performed by Dawna Part, MD at [facility] ENDO   ? ESOPHAGOGASTRODUODENOSCOPY N/A 11/08/2017    Performed by Dawna Part, MD at [facility] ENDO   ? ESOPHAGOGASTRODUODENOSCOPY WITH BIOPSY - FLEXIBLE  11/08/2017    Performed by Dawna Part, MD at [facility] ENDO   ? COLONOSCOPY WITH HOT BIOPSY FORCEPS REMOVAL TUMOR/ POLYP/ OTHER LESION  11/08/2017    Performed by Dawna Part,  MD at [facility] ENDO   ? ESOPHAGOGASTRODUODENOSCOPY WITH BAND LIGATION ESOPHAGEAL/ GASTRIC VARICES - FLEXIBLE N/A 04/10/2018    Performed by Normajean Baxter, MD at [facility] ENDO   ? ESOPHAGOGASTRODUODENOSCOPY WITH BIOPSY - FLEXIBLE N/A 04/10/2018    Performed by Normajean Baxter, MD at [facility] ENDO   ? ESOPHAGOGASTRODUODENOSCOPY WITH CONTROL OF BLEEDING - FLEXIBLE N/A 04/25/2018    Performed by Jolee Ewing, MD at [facility] ENDO   ? ESOPHAGOGASTRODUODENOSCOPY WITH BIOPSY - FLEXIBLE with push enteroscopy N/A 08/28/2018    Performed by Celesta Gentile, MD at [facility] ENDO   ? EGD N/A 10/27/2018    Performed by Eliott Nine, MD at [facility] ENDO   ? ESOPHAGOGASTRODUODENOSCOPY WITH SNARE REMOVAL TUMOR/ POLYP/ OTHER LESION - FLEXIBLE N/A 10/27/2018    Performed by Eliott Nine, MD at [facility] ENDO   ? ESOPHAGOGASTRODUODENOSCOPY WITH BIOPSY - FLEXIBLE N/A 12/16/2018    Performed by Buckles, Vinnie Level, MD at [facility] ENDO   ? ESOPHAGOGASTRODUODENOSCOPY WITH CONTROL OF BLEEDING - FLEXIBLE N/A 12/16/2018    Performed by Buckles, Vinnie Level, MD at [facility] ENDO   ? ESOPHAGOGASTRODUODENOSCOPY WITH SPECIMEN COLLECTION BY BRUSHING/ WASHING N/A 01/22/2019    Performed by Veneta Penton, MD at [facility] ENDO   ? ESOPHAGOGASTRODUODENOSCOPY WITH DILATION ESOPHAGUS WITH BALLOON 30 MM OR GREATER - FLEXIBLE N/A 01/22/2019    Performed by Veneta Penton, MD at [facility] ENDO   ? ESOPHAGOGASTRODUODENOSCOPY WITH BIOPSY - FLEXIBLE N/A 01/22/2019    Performed by Veneta Penton, MD at [facility] ENDO   ? ESOPHAGOGASTRODUODENOSCOPY WITH BIOPSY - FLEXIBLE N/A 06/19/2019    Performed by Dawna Part, MD at [facility] ENDO   ? ESOPHAGOGASTRODUODENOSCOPY [WITH APC]  WITH SPECIMEN COLLECTION BY BRUSHING/ WASHING N/A 08/07/2019    Performed by Dawna Part, MD at [facility] ENDO   ? ESOPHAGOGASTRODUODENOSCOPY WITH SPECIMEN COLLECTION BY BRUSHING/ WASHING N/A 09/10/2019    Performed by Buckles, Vinnie Level, MD at [facility] ENDO   ? ESOPHAGOGASTRODUODENOSCOPY WITH CONTROL OF BLEEDING - FLEXIBLE N/A 09/10/2019    Performed by Buckles, Vinnie Level, MD at [facility] ENDO   ? ESOPHAGOGASTRODUODENOSCOPY WITH CONTROL OF BLEEDING - FLEXIBLE N/A 02/12/2020    Performed by Lenor Derrick, MD at [facility] ENDO   ? ESOPHAGOGASTRODUODENOSCOPY WITH BIOPSY - FLEXIBLE N/A 02/26/2020    Performed by Buckles, Vinnie Level, MD at [facility] ENDO   ? CHOLECYSTECTOMY     ? COLONOSCOPY     ? ESOPHAGOGASTRIC FUNDOPLICATION  2003, 2004    laparoscopic   ? ESOPHAGOGASTRIC FUNDOPLICATION     ? HX HYSTERECTOMY     ? PR ESOPHAGOSCOPY FLEXIBLE TRANSORAL DIAGNOSTIC        Social History     Tobacco Use   ? Smoking status: Never Smoker   ? Smokeless tobacco: Never Used   Substance Use Topics   ? Alcohol use: No      Family History   Problem Relation Age of Onset   ? Other Mother    ? Kidney Failure Mother    ? Osteoporosis Mother ? Cancer Father         esophageal   ? Liver Disease Sister         endstage, s/p liver transplant   ? Cancer Brother         tonsil, liver    ? Hip Fracture Neg Hx       Medications Prior to Admission   Medication Sig Dispense Refill Last Dose   ? cetirizine (ZYRTEC) 10 mg tablet  Take 10 mg by mouth daily as needed for Allergy symptoms.      ? ciprofloxacin (CIPRO) 500 mg tablet Take one tablet by mouth daily. 30 tablet 1    ? duloxetine DR (CYMBALTA) 60 mg capsule Take 60 mg by mouth daily.      ? midodrine (PROAMITINE) 10 mg tablet Take one tablet by mouth three times daily. 90 tablet 1    ? pantoprazole DR (PROTONIX) 40 mg tablet Take one tablet by mouth twice daily. 60 tablet 3    ? polyethylene glycol 3350 (MIRALAX) 17 g packet Take one packet by mouth twice daily.      ? promethazine (PHENERGAN) 25 mg tablet Take 25 mg by mouth every 4 hours as needed. Just uses it with Tramadol      ? rifAXIMin (XIFAXAN) 550 mg tablet Take one tablet by mouth twice daily. 180 tablet 3    ? zinc sulfate 220 mg (50 mg elemental zinc) capsule Take one capsule by mouth daily. 90 capsule 3      Allergies   Allergen Reactions   ? Tegaderm BLISTERS   ? Adhesive Tape (Rosins) RASH     Other reaction(s): irritated skin   ? Sulfa (Sulfonamide Antibiotics) RASH     Other reaction(s): Unknown Reaction   ? Codeine NAUSEA AND VOMITING   ? Morphine SEE COMMENTS     Pt reports burns her veins  Other reaction(s): Unknown Reaction   ? Penicillin G SEE COMMENTS     Throat issues, blisters in throat    ? Tramadol NAUSEA ONLY and ITCHING     Takes this medication regularly        Review of Systems  Constitutional: negative for fevers  Respiratory: positive for dyspnea on exertion  Cardiovascular: negative for chest pain  Gastrointestinal: positive for abdominal pain    Physical Exam:  Vital Signs: Last Filed In 24 Hours Vital Signs: 24 Hour Range   BP: 128/62 (06/04 0843)  Pulse: 98 (06/04 0843)  SpO2: 100 % (06/04 0843)  Height: 160 cm (63) (06/04 0843) BP: (128)/(62)   Pulse:  [98]   SpO2:  [100 %]           General appearance: alert and no distress noted.  Neurologic: Grossly normal.  Lungs: Non labored.  Heart: regular rate and rhythm  Abdomen: distended    Pre-procedure anxiolysis plan: N/A  Sedation/Medication Plan: Local anesthetic  Personal history of sedation complications: Denies adverse event.   Family history of sedation complications: Denies adverse event.   Medications for Reversal: NA  Discussion/Reviews:  Physician has discussed risks and alternatives of this type of sedation and above planned procedures with patient    NPO Status: NA  Airway:  NA  Head and Neck: NA  Mouth: NA   Pregnancy Status: N/A    Lab/Radiology/Other Diagnostic Tests:  Labs:  Pertinent labs reviewed           Samson Frederic, APRN-NP  Pager [phone]

## 2020-04-22 NOTE — Progress Notes
Notified Dr. Ladona Ridgel and Beverely Risen, RN, about pt's critical hemoglobin result of 5.9. RBC order was placed and pt to receive unit of blood in IR pre/post.

## 2020-04-22 NOTE — Progress Notes
Date of Service: 04/22/2020    Subjective:                History of Present Illness    Kristin Moody is a 64 y.o. female.  She returns today for proximal fifth metacarpal fracture.  She has been in a short arm cast for 2 weeks.  She notes improvement in overall pain though it is still sore.  Her skin continues to heal well.  She had several abrasions after her fall. No other questions today.       Review of Systems   Musculoskeletal: Positive for arthralgias.   Neurological: Negative for weakness and numbness.   All other systems reviewed and are negative.        Objective:         ? cetirizine (ZYRTEC) 10 mg tablet Take 10 mg by mouth daily as needed for Allergy symptoms.   ? ciprofloxacin (CIPRO) 500 mg tablet Take one tablet by mouth daily.   ? duloxetine DR (CYMBALTA) 60 mg capsule Take 60 mg by mouth daily.   ? midodrine (PROAMITINE) 10 mg tablet Take one tablet by mouth three times daily.   ? pantoprazole DR (PROTONIX) 40 mg tablet Take one tablet by mouth twice daily.   ? polyethylene glycol 3350 (MIRALAX) 17 g packet Take one packet by mouth twice daily.   ? promethazine (PHENERGAN) 25 mg tablet Take 25 mg by mouth every 4 hours as needed. Just uses it with Tramadol   ? rifAXIMin (XIFAXAN) 550 mg tablet Take one tablet by mouth twice daily.   ? zinc sulfate 220 mg (50 mg elemental zinc) capsule Take one capsule by mouth daily.     Vitals:    04/22/20 0843   BP: 128/62   Pulse: 98   SpO2: 100%   Height: 160 cm (63)   PainSc: Two     Body mass index is 28.34 kg/m?Marland Kitchen     Physical Exam  Constitutional:       General: She is not in acute distress.     Appearance: She is well-developed. She is not diaphoretic.   HENT:      Head: Normocephalic and atraumatic.   Eyes:      Conjunctiva/sclera: Conjunctivae normal.   Pulmonary:      Effort: Pulmonary effort is normal.   Musculoskeletal:      Cervical back: Normal range of motion.   Skin:     General: Skin is warm and dry.      Findings: No erythema. Neurological:      Mental Status: She is alert and oriented to person, place, and time.       Left Hand Exam     Tenderness   Left hand tenderness location: Tenderness at the base of the fifth metacarpal.     Range of Motion   Wrist   Extension: normal   Flexion: normal   Hand   MP Little: normal   PIP Little: normal   DIP Little: normal     Muscle Strength   Wrist extension: 4/5   Wrist flexion: 4/5   Grip:  4/5     Other   Erythema: absent  Scars: absent  Sensation: normal  Pulse: present    Comments:  Wound located over the volar aspect of the proximal forearm continues to heal.                 Assessment and Plan:       Kristin Moody  Kristin Moody was seen today for follow up.    Diagnoses and all orders for this visit:    Left wrist pain  -No significant displacement of the oblique fracture at the base of the fifth metacarpal.  Possible slight interval healing.  -Pain likely secondary to left fifth metacarpal fracture.  The nature of this pathology was discussed with the patient.  -Imaging: No further imaging today.  We will reimage in 2 weeks.  -Lifestyle modifications: Continue Exos splint unless bathing.  Recommended leaving out for 30 to 45 minutes after bathing to let dry.  The arm should be supported and not significantly used during this time.  We will attempt to let the skin dry well to avoid further skin issues as she is trying to heal from her previous fall.  -Medication: Continue home medications.  -Therapy: No specific therapy today.  -Intervention: No further intervention at this time.  -Activity: As tolerated.  -RTO in 2 weeks or sooner PRN increased pain.  She will be 4 weeks out from original injury at that time.  -The pt understands the above assessment and plan and has no further questions             Lynnda Child, MD    Parts of this clinic note were completed using a dragon voice recognition software system. Please excuse any misspellings or grammatical errors.  If you need clarification please contact my office.

## 2020-04-22 NOTE — Patient Instructions
It was our pleasure to see you today.  If you need anything further contact our office at [phone]  Luke Thompson MD & Christo Hain RN at [facility].

## 2020-04-25 ENCOUNTER — Encounter: Admit: 2020-04-25 | Discharge: 2020-04-25 | Payer: MEDICARE

## 2020-04-25 DIAGNOSIS — K760 Fatty (change of) liver, not elsewhere classified: Secondary | ICD-10-CM

## 2020-04-25 DIAGNOSIS — D649 Anemia, unspecified: Secondary | ICD-10-CM

## 2020-04-25 MED ORDER — ACETAMINOPHEN 500 MG PO TAB
500 mg | Freq: Once | ORAL | 0 refills | Status: CN
Start: 2020-04-25 — End: ?

## 2020-04-25 MED ORDER — IRON DEXTRAN IVPB
975 mg | Freq: Once | INTRAVENOUS | 0 refills | Status: CN
Start: 2020-04-25 — End: ?

## 2020-04-25 MED ORDER — DIPHENHYDRAMINE HCL 25 MG PO CAP
25 mg | Freq: Once | ORAL | 0 refills | Status: CN
Start: 2020-04-25 — End: ?

## 2020-04-25 MED ORDER — IRON DEXTRAN IVPB
25 mg | Freq: Once | INTRAVENOUS | 0 refills | Status: CN
Start: 2020-04-25 — End: ?

## 2020-04-25 NOTE — Telephone Encounter
spoke to patient, advised labs due tomorrow at [facility]. She v/u. Orders are in. appt. made in CFT.

## 2020-04-26 ENCOUNTER — Ambulatory Visit: Admit: 2020-04-26 | Discharge: 2020-04-26 | Payer: MEDICARE

## 2020-04-26 ENCOUNTER — Encounter: Admit: 2020-04-26 | Discharge: 2020-04-26 | Payer: MEDICARE

## 2020-04-26 DIAGNOSIS — R945 Abnormal results of liver function studies: Secondary | ICD-10-CM

## 2020-04-26 DIAGNOSIS — D500 Iron deficiency anemia secondary to blood loss (chronic): Secondary | ICD-10-CM

## 2020-04-26 DIAGNOSIS — D649 Anemia, unspecified: Secondary | ICD-10-CM

## 2020-04-26 DIAGNOSIS — K31819 Angiodysplasia of stomach and duodenum without bleeding: Secondary | ICD-10-CM

## 2020-04-26 DIAGNOSIS — K7469 Other cirrhosis of liver: Secondary | ICD-10-CM

## 2020-04-26 DIAGNOSIS — K760 Fatty (change of) liver, not elsewhere classified: Secondary | ICD-10-CM

## 2020-04-26 LAB — COMPREHENSIVE METABOLIC PANEL
Lab: 1.1 mg/dL (ref 0.3–1.2)
Lab: 1.2 mg/dL — ABNORMAL HIGH (ref 0.4–1.00)
Lab: 106 MMOL/L — ABNORMAL HIGH (ref >60)
Lab: 135 MMOL/L — ABNORMAL LOW (ref 137–147)
Lab: 147 U/L — ABNORMAL HIGH (ref 25–110)
Lab: 164 mg/dL — ABNORMAL HIGH (ref 70–100)
Lab: 2.8 g/dL — ABNORMAL LOW (ref 3.5–5.0)
Lab: 29 mg/dL — ABNORMAL HIGH (ref 7–25)
Lab: 4.4 MMOL/L — ABNORMAL LOW (ref >60)
Lab: 45 mL/min — ABNORMAL LOW (ref >60)
Lab: 5.3 g/dL — ABNORMAL LOW (ref 6.0–8.0)
Lab: 54 mL/min — ABNORMAL LOW (ref >60)
Lab: 8.3 mg/dL — ABNORMAL LOW (ref 8.5–10.6)

## 2020-04-26 LAB — CBC AND DIFF
Lab: 2.7 M/UL — ABNORMAL LOW (ref 4.0–5.0)
Lab: 6.5 g/dL — ABNORMAL LOW (ref 12.0–15.0)
Lab: 6.7 K/UL — ABNORMAL HIGH (ref 4.5–11.0)
Lab: 78 FL — ABNORMAL LOW (ref 80–100)

## 2020-04-26 LAB — IRON + BINDING CAPACITY + %SAT+ FERRITIN
Lab: 15 ug/dL — ABNORMAL LOW (ref 50–160)
Lab: 24 ng/mL (ref 10–200)
Lab: 340 ug/dL — ABNORMAL HIGH (ref 270–380)
Lab: 4 % — ABNORMAL LOW (ref 28–42)

## 2020-04-26 LAB — PROTIME INR (PT): Lab: 1.5 % — ABNORMAL HIGH (ref 0.8–1.2)

## 2020-04-26 MED ORDER — PANTOPRAZOLE 40 MG PO TBEC
ORAL_TABLET | Freq: Two times a day (BID) | ORAL | 1 refills | 90.00000 days | Status: AC
Start: 2020-04-26 — End: ?

## 2020-04-26 NOTE — Telephone Encounter
patient hgb 6.5, advised patient she is scheduled for blood transfusion tomorrow at 1230. She v/u. Patient advised to get repeat labs Tuesday 6/15 at [place].

## 2020-04-27 ENCOUNTER — Encounter: Admit: 2020-04-27 | Discharge: 2020-04-27 | Payer: MEDICARE

## 2020-04-27 ENCOUNTER — Ambulatory Visit: Admit: 2020-04-27 | Discharge: 2020-04-28 | Payer: MEDICARE

## 2020-04-27 DIAGNOSIS — D649 Anemia, unspecified: Principal | ICD-10-CM

## 2020-04-27 NOTE — Patient Instructions
[facility]  OUTPATIENT POST TRANSFUSION INSTRUCTIONS     During your transfusion you were monitored by nursing staff for signs of a transfusion reaction, however, you should continue to observe for the following symptoms after you have been dismissed from the clinic:     FEVER OF 100.5 OR HIGHER  CHILLS  NAUSEA OR VOMITING  FEELING FAINT OR DIZZY  SHORTNESS OF BREATH  DARK OR RED COLORED URINE  CHEST OR BACK PAIN  SHOCK OR LOSS OF CONSCIOUSNESS  YELLOWING OF THE EYES OR SKIN     If you or your caregiver notice any of these symptoms, contact your ordering physician immediately and go to the nearest Emergency Department for treatment. When you arrive at the Emergency Department notify them that you recently received a blood transfusion. *For more detailed signs and symptoms of a transfusion reaction, please refer to the [place] education information below.          When You Need a Blood Transfusion (Adult)  A blood transfusion may be done when you have lost blood because of an injury or during surgery. It can also be done because of diseases or conditions that affect the blood. Blood is made up of several different parts (blood products). You may receive some or all of these blood products during a transfusion. Blood for transfusion is usually donated from another person (donor). Strict measures are taken to make sure that donated blood is safe before it's given to you. This sheet helps you understand how a blood transfusion is done. Your healthcare provider will discuss your condition with you and answer your questions.   The parts of blood  Blood can be broken down into different parts that perform special roles in the body. These parts include:  ? Red blood cells, which carry oxygen throughout the body.  ? Platelets, which help stop bleeding.  ? Plasma (the liquid part of blood), which carries red blood cells and platelets throughout the body. Plasma also helps platelets in stopping bleeding. Where does donated blood come from?  ? Volunteer donors. These are people who donate their blood to help others in need of blood. Blood donation can take place at several places, including a hospital, blood bank, or during a blood drive.  ? Directed donation. If you need a blood transfusion during a planned surgery, family and friends can have their blood tested for compatibility and donate blood for you before the surgery. This needs to be done at least 7 day(s) in advance. This is because the blood must be tested for safety.  ? Autologous donation. This is also called self-donation. For planned surgery, you can donate your own blood starting up to 6 weeks before surgery.  Are blood transfusions safe?  Donated blood is tested and processed to make sure that the blood is safe:  ? The health and medical history of each donor is carefully screened. If a person is considered high-risk for infection or problems, he or she isn't accepted as a blood donor.  ? Donated blood is tested for infections such as hepatitis, syphilis, and HIV (the virus that causes AIDS). If the tested blood is found to be unsafe, it's destroyed.  ? Blood is divided into four types: A, B, AB, and O. Blood also has Rh types: positive (+) and negative (-). You can only receive blood products that are compatible with (match) your blood type. A sample of your blood is tested for compatibility with donated blood. This is  done before blood products are prepared for a transfusion.  How is a blood transfusion done?  A blood transfusion takes place in a blood center, infusion center, hospital room, or operating room. Your healthcare provider will discuss the blood transfusion with you before it's done. You'll need to give permission for the blood transfusion by signing a consent form.  ? Two healthcare providers confirm your identity. They also confirm that they have the correct blood product(s) for you.  ? An intravenous (IV) line is placed in a vein if you do not already have an IV.  ? The blood product comes in a plastic bag that is hung on an IV pole. The blood product flows from the bag into your IV line. The IV line may be connected to a pump, which controls the transfusion rate. You may receive more than one kind of blood product through the IV.  ? Your vital signs (blood pressure, heart rate, respiratory rate, and temperature) are checked throughout the transfusion. This is to make sure you are not having a reaction to the blood product.  ? The IV line may be removed once the transfusion is complete.  Possible risks and complications of blood transfusions  Most transfusions are problem free. In some cases, reactions occur. These can happen within seconds to minutes during the transfusion or a week to a few months after the transfusion. Call your doctor or nurse right away if you have any of the signs or symptoms in the table below during or after a transfusion:  Reaction Timing Symptoms   Allergic reaction (mild) ? Within seconds to minutes during the transfusion  ? Up to 24 hours after the transfusion Hives or red welts on the skin, mild itching, rash, localized swelling, flushing (red face), wheezing, shortness of breath, or stridor (high-pitched noise or sound)   Anaphylactic reaction ? Within seconds to minutes during the transfusion  ? Up to 24 hours after the transfusion Shortness of breath, flushing (red face), wheezing, labored (working hard) breathing, low blood pressure, localized swelling, chest tightness, or cramps   Febrile nonhemolytic reaction ? Within minutes to hours during the transfusion  ? Within a few hours to 24 hours after the transfusion Fever (increase of 1? C or higher), chills, flushing (red face), nausea, headache, minor discomfort, or mild shortness of breath   Acute immune hemolytic reaction ? Within minutes during the transfusion  ? Up to 24 hours after the transfusion Fever, red or brown urine, back pain, fast heart rate (tachycardia), abdominal pain, low blood pressure, feeling anxious, chills, chest pain, nausea, or fainting spells   Transfusion-related acute lung injury (TRALI) ? Within 1 to 2 hours during the transfusion  ? Up to 6 hours after the transfusion Shortness of breath, trouble breathing, low blood pressure, fever, pulmonary edema   Transfusion-associated circulatory overload ? Near the end of the transfusion  ? Within 6 hours after the transfusion Shortness of breath, fast heart rate (tachycardia), problems breathing when lying on back, abnormal blood pressure   Post-transfusion purpura (PUP) ? Within 1 week  ? Up to 48 days after the transfusion Purple spots on skin; nose bleed; bleeding from the urinary tract, abdomen, colon, or rectum; fever; or chills     Delayed transfusion-related acute lung injury (TRALI) ? Within 72 hours (3 days) after the transfusion Sudden onset of respiratory distress or trouble breathing   Delayed hemolytic reaction ? Within 3 to 7 days  ? Up to weeks after the  transfusion Low-grade fever, mild jaundice (yellowing of the skin and whites of the eyes), decrease in hematocrit, chills, chest pain, back pain, nausea   ? 2000-2019 [employer], [place]. [address] [place], [place] [address]. All rights reserved. This information is not intended as a substitute for professional medical care. Always follow your healthcare professional's instructions.

## 2020-04-28 ENCOUNTER — Encounter: Admit: 2020-04-28 | Discharge: 2020-04-28 | Payer: MEDICARE

## 2020-04-28 ENCOUNTER — Ambulatory Visit: Admit: 2020-04-28 | Discharge: 2020-04-29 | Payer: MEDICARE

## 2020-04-28 DIAGNOSIS — J329 Chronic sinusitis, unspecified: Secondary | ICD-10-CM

## 2020-04-28 DIAGNOSIS — D500 Iron deficiency anemia secondary to blood loss (chronic): Secondary | ICD-10-CM

## 2020-04-28 DIAGNOSIS — I729 Aneurysm of unspecified site: Secondary | ICD-10-CM

## 2020-04-28 DIAGNOSIS — E119 Type 2 diabetes mellitus without complications: Secondary | ICD-10-CM

## 2020-04-28 DIAGNOSIS — R0600 Dyspnea, unspecified: Secondary | ICD-10-CM

## 2020-04-28 DIAGNOSIS — I10 Essential (primary) hypertension: Secondary | ICD-10-CM

## 2020-04-28 DIAGNOSIS — K760 Fatty (change of) liver, not elsewhere classified: Secondary | ICD-10-CM

## 2020-04-28 DIAGNOSIS — D649 Anemia, unspecified: Secondary | ICD-10-CM

## 2020-04-28 DIAGNOSIS — Z01810 Encounter for preprocedural cardiovascular examination: Secondary | ICD-10-CM

## 2020-04-28 DIAGNOSIS — E669 Obesity, unspecified: Secondary | ICD-10-CM

## 2020-04-28 DIAGNOSIS — K5730 Diverticulosis of large intestine without perforation or abscess without bleeding: Secondary | ICD-10-CM

## 2020-04-28 DIAGNOSIS — K7460 Unspecified cirrhosis of liver: Secondary | ICD-10-CM

## 2020-04-28 MED ORDER — IRON DEXTRAN IVPB
975 mg | Freq: Once | INTRAVENOUS | 0 refills | Status: CP
Start: 2020-04-28 — End: ?

## 2020-04-28 MED ORDER — DIPHENHYDRAMINE HCL 25 MG PO CAP
25 mg | Freq: Once | ORAL | 0 refills | Status: CN
Start: 2020-04-28 — End: ?

## 2020-04-28 MED ORDER — IRON DEXTRAN IVPB
975 mg | Freq: Once | INTRAVENOUS | 0 refills | Status: CN
Start: 2020-04-28 — End: ?

## 2020-04-28 MED ORDER — IRON DEXTRAN IVPB
25 mg | Freq: Once | INTRAVENOUS | 0 refills | Status: CP
Start: 2020-04-28 — End: ?

## 2020-04-28 MED ORDER — ACETAMINOPHEN 500 MG PO TAB
500 mg | Freq: Once | ORAL | 0 refills | Status: CN
Start: 2020-04-28 — End: ?

## 2020-04-28 MED ORDER — DIPHENHYDRAMINE HCL 25 MG PO CAP
25 mg | Freq: Once | ORAL | 0 refills | Status: CP
Start: 2020-04-28 — End: ?

## 2020-04-28 MED ORDER — ACETAMINOPHEN 500 MG PO TAB
500 mg | Freq: Once | ORAL | 0 refills | Status: DC
Start: 2020-04-28 — End: 2020-04-28

## 2020-04-28 MED ORDER — IRON DEXTRAN IVPB
25 mg | Freq: Once | INTRAVENOUS | 0 refills | Status: CN
Start: 2020-04-28 — End: ?

## 2020-04-29 ENCOUNTER — Encounter: Admit: 2020-04-29 | Discharge: 2020-04-29 | Payer: MEDICARE

## 2020-04-29 ENCOUNTER — Ambulatory Visit: Admit: 2020-04-29 | Discharge: 2020-04-29 | Payer: MEDICARE

## 2020-04-29 DIAGNOSIS — R945 Abnormal results of liver function studies: Secondary | ICD-10-CM

## 2020-04-29 DIAGNOSIS — K760 Fatty (change of) liver, not elsewhere classified: Principal | ICD-10-CM

## 2020-04-29 DIAGNOSIS — D500 Iron deficiency anemia secondary to blood loss (chronic): Secondary | ICD-10-CM

## 2020-04-29 DIAGNOSIS — K31819 Angiodysplasia of stomach and duodenum without bleeding: Secondary | ICD-10-CM

## 2020-04-29 DIAGNOSIS — K7469 Other cirrhosis of liver: Secondary | ICD-10-CM

## 2020-04-29 DIAGNOSIS — D649 Anemia, unspecified: Secondary | ICD-10-CM

## 2020-04-29 LAB — COMPREHENSIVE METABOLIC PANEL
Lab: 1 mg/dL — ABNORMAL HIGH (ref 0.4–1.00)
Lab: 105 MMOL/L (ref 98–110)
Lab: 134 MMOL/L — ABNORMAL LOW (ref 137–147)
Lab: 148 U/L — ABNORMAL HIGH (ref 25–110)
Lab: 161 mg/dL — ABNORMAL HIGH (ref 70–100)
Lab: 2.9 g/dL — ABNORMAL LOW (ref 3.5–5.0)
Lab: 20 MMOL/L — ABNORMAL LOW (ref 21–30)
Lab: 20 U/L (ref 7–56)
Lab: 27 mg/dL — ABNORMAL HIGH (ref 7–25)
Lab: 38 U/L (ref 7–40)
Lab: 4 MMOL/L (ref 3.5–5.1)
Lab: 5.5 g/dL — ABNORMAL LOW (ref 6.0–8.0)
Lab: 51 mL/min — ABNORMAL LOW (ref >60)
Lab: 8.7 mg/dL (ref 8.5–10.6)
Lab: >60 mL/min (ref >60)
Lab: 9 (ref 3–12)

## 2020-04-29 LAB — CBC AND DIFF
Lab: 0.1 10*3/uL (ref 0–0.20)
Lab: 0.2 10*3/uL (ref 0–0.45)
Lab: 0.8 10*3/uL — ABNORMAL HIGH (ref 0–0.80)
Lab: 0.9 10*3/uL — ABNORMAL LOW (ref 1.0–4.8)
Lab: 11 % (ref 4–12)
Lab: 11 % — ABNORMAL LOW (ref 24–44)
Lab: 2 % (ref 0–2)
Lab: 206 10*3/uL (ref 150–400)
Lab: 21 % — ABNORMAL HIGH (ref 11–15)
Lab: 22 % — ABNORMAL LOW (ref 36–45)
Lab: 24 pg — ABNORMAL LOW (ref 26–34)
Lab: 3 % (ref 0–5)
Lab: 3 M/UL — ABNORMAL LOW (ref 4.0–5.0)
Lab: 32 g/dL (ref 32.0–36.0)
Lab: 5.8 10*3/uL (ref 1.8–7.0)
Lab: 7.3 g/dL — ABNORMAL LOW (ref 12.0–15.0)
Lab: 73 % (ref 41–77)
Lab: 75 FL — ABNORMAL LOW (ref 80–100)
Lab: 8 10*3/uL (ref 4.5–11.0)
Lab: 8.7 FL (ref 7–11)

## 2020-04-29 LAB — GRAM STAIN

## 2020-04-29 LAB — POC GLUCOSE: Lab: 167 mg/dL — ABNORMAL HIGH (ref 70–100)

## 2020-04-29 LAB — PROTIME INR (PT): Lab: 1.4 — ABNORMAL HIGH (ref 0.8–1.2)

## 2020-04-29 MED ORDER — ALBUMIN, HUMAN 25 % IV SOLP
0 refills | Status: CP
Start: 2020-04-29 — End: ?
  Administered 2020-04-29: 16:00:00 37.5 g via INTRAVENOUS

## 2020-04-29 NOTE — Telephone Encounter
04/29/20 Verified active Insurance via [employer]   [facility] [employer] Reference Number:  [id number]  [employer] [employer] Reference Number:  [id number]  No Listing Auth Required

## 2020-04-29 NOTE — Telephone Encounter
Received call from [facility] in IR. Patient is in IR for paracentesis, labs drawn and hgb 7.3, patient wanting to make sure she doesn't need transfusion. Reviewed previous notes, patient states goal to maintain >7, notes state plan for patient to come to [place] for labs Tuesday 6/15, no transfusion today, will reevaluate based on lab results from 6/15.

## 2020-04-29 NOTE — Progress Notes
Reviewed discharge instructions with patient. Pt verbalizes understanding and had no further questions at this time. PIV was removed, site clean, dry and intact. Pt was wheeled to front and into private vehicle with driver.

## 2020-04-29 NOTE — H&P (View-Only)
IR Pre-Procedure History and Physical/Sedation Plan    Procedure Date: 04/29/2020     Planned Procedure(s):  Ultrasound-guided paracentesis     Indication:  Fluid testing; Therapeutic drainage  __________________________________________________________________    Chief Complaint:  Ascites    History of Present Illness: Kristin Moody is a 64 y.o. female with a history as listed below who presents today for procedure.    Patient Active Problem List    Diagnosis Date Noted   ? 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 Hypotension 02/10/2020   ? 'Light-for-dates' infant with signs of fetal malnutrition 01/22/2020   ? 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 Anemia 09/28/2019   ? Encephalopathy 08/12/2019   ? Spasticity 08/10/2019   ? Hyperreflexia 08/10/2019   ? 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 Right leg weakness 05/14/2019   ? 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 Iron (Fe) deficiency anemia 01/19/2019   ? Preop cardiovascular exam 01/07/2019   ? Pre-transplant evaluation for liver transplant 01/06/2019   ? Cirrhosis (HCC) 12/16/2018   ? Acute on chronic anemia 12/16/2018   ? Lactic acidosis 12/16/2018   ? GI bleed 10/24/2018   ? Portal hypertension (HCC) 08/28/2018   ? Confusion 08/27/2018   ? Dyspnea 08/27/2018   ? Chronic abdominal pain 08/27/2018   ? 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 Acute on chronic blood loss anemia 08/27/2018   ? Iron deficiency anemia 04/13/2018   ? Pneumonia due to infectious organism 04/13/2018   ? Melena 04/10/2018   ? Esophageal varices without bleeding (HCC) 02/25/2018   ? Cirrhosis of liver without ascites (HCC) 02/25/2018   ? 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 Lower abdominal pain 10/24/2014   ? Chest discomfort 09/21/2014   ? Essential hypertension 08/13/2012   ? Abnormal liver function tests 05/13/2011   ? Fatty liver disease, nonalcoholic 05/13/2011   ? Obesity (BMI 30-39.9) 05/13/2011   ? Slow transit constipation 05/13/2011     Medical History:   Diagnosis Date   ? Aneurysm (HCC)    ? 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 Diverticulosis of colon     descending and sigmoid colon   ? Dyspnea    ? Essential hypertension 08/13/2012   ? Fatty infiltration of liver    ? HTN (hypertension)    ? Obesity (BMI 30-39.9) 05/13/2011   ? Preop cardiovascular exam 01/07/2019   ? Sinus infection    ? Type 2 diabetes mellitus (HCC) 09/21/2014      Surgical History:   Procedure Laterality Date   ? HYSTERECTOMY  1994   ? UPPER GASTROINTESTINAL ENDOSCOPY  2009   ? COLONOSCOPY  2009   ? CYSTOCELE REPAIR  2009    with endocele repair   ? RECTOCELE REPAIR  03/2009   ? LIVER BIOPSY  07/17/2010   ? COLONOSCOPY N/A 11/08/2017    Performed by Dawna Part, MD at [facility] ENDO   ? ESOPHAGOGASTRODUODENOSCOPY N/A 11/08/2017    Performed by Dawna Part, MD at [facility] ENDO   ? ESOPHAGOGASTRODUODENOSCOPY WITH BIOPSY - FLEXIBLE  11/08/2017    Performed by Dawna Part, MD at [facility] ENDO   ? COLONOSCOPY WITH HOT BIOPSY FORCEPS REMOVAL TUMOR/ POLYP/ OTHER LESION  11/08/2017    Performed by Dawna Part, MD at [facility] ENDO   ? ESOPHAGOGASTRODUODENOSCOPY WITH BAND LIGATION ESOPHAGEAL/ GASTRIC  VARICES - FLEXIBLE N/A 04/10/2018    Performed by Normajean Baxter, MD at [facility] ENDO   ? ESOPHAGOGASTRODUODENOSCOPY WITH BIOPSY - FLEXIBLE N/A 04/10/2018    Performed by Normajean Baxter, MD at [facility] ENDO   ? ESOPHAGOGASTRODUODENOSCOPY WITH CONTROL OF BLEEDING - FLEXIBLE N/A 04/25/2018    Performed by Jolee Ewing, MD at [facility] ENDO   ? ESOPHAGOGASTRODUODENOSCOPY WITH BIOPSY - FLEXIBLE with push enteroscopy N/A 08/28/2018    Performed by Celesta Gentile, MD at [facility] ENDO   ? EGD N/A 10/27/2018    Performed by Eliott Nine, MD at [facility] ENDO   ? ESOPHAGOGASTRODUODENOSCOPY WITH SNARE REMOVAL TUMOR/ POLYP/ OTHER LESION - FLEXIBLE N/A 10/27/2018    Performed by Eliott Nine, MD at [facility] ENDO   ? ESOPHAGOGASTRODUODENOSCOPY WITH BIOPSY - FLEXIBLE N/A 12/16/2018    Performed by Buckles, Vinnie Level, MD at [facility] ENDO   ? ESOPHAGOGASTRODUODENOSCOPY WITH CONTROL OF BLEEDING - FLEXIBLE N/A 12/16/2018    Performed by Buckles, Vinnie Level, MD at [facility] ENDO   ? ESOPHAGOGASTRODUODENOSCOPY WITH SPECIMEN COLLECTION BY BRUSHING/ WASHING N/A 01/22/2019    Performed by Veneta Penton, MD at [facility] ENDO   ? ESOPHAGOGASTRODUODENOSCOPY WITH DILATION ESOPHAGUS WITH BALLOON 30 MM OR GREATER - FLEXIBLE N/A 01/22/2019    Performed by Veneta Penton, MD at [facility] ENDO   ? ESOPHAGOGASTRODUODENOSCOPY WITH BIOPSY - FLEXIBLE N/A 01/22/2019    Performed by Veneta Penton, MD at [facility] ENDO   ? ESOPHAGOGASTRODUODENOSCOPY WITH BIOPSY - FLEXIBLE N/A 06/19/2019    Performed by Dawna Part, MD at [facility] ENDO   ? ESOPHAGOGASTRODUODENOSCOPY [WITH APC]  WITH SPECIMEN COLLECTION BY BRUSHING/ WASHING N/A 08/07/2019    Performed by Dawna Part, MD at [facility] ENDO   ? ESOPHAGOGASTRODUODENOSCOPY WITH SPECIMEN COLLECTION BY BRUSHING/ WASHING N/A 09/10/2019    Performed by Buckles, Vinnie Level, MD at [facility] ENDO   ? ESOPHAGOGASTRODUODENOSCOPY WITH CONTROL OF BLEEDING - FLEXIBLE N/A 09/10/2019    Performed by Buckles, Vinnie Level, MD at [facility] ENDO   ? ESOPHAGOGASTRODUODENOSCOPY WITH CONTROL OF BLEEDING - FLEXIBLE N/A 02/12/2020    Performed by Lenor Derrick, MD at [facility] ENDO   ? ESOPHAGOGASTRODUODENOSCOPY WITH BIOPSY - FLEXIBLE N/A 02/26/2020    Performed by Buckles, Vinnie Level, MD at [facility] ENDO   ? CHOLECYSTECTOMY     ? COLONOSCOPY     ? ESOPHAGOGASTRIC FUNDOPLICATION  2003, 2004    laparoscopic   ? ESOPHAGOGASTRIC FUNDOPLICATION     ? HX HYSTERECTOMY     ? PR ESOPHAGOSCOPY FLEXIBLE TRANSORAL DIAGNOSTIC        Social History     Tobacco Use   ? Smoking status: Never Smoker   ? Smokeless tobacco: Never Used   Substance Use Topics   ? Alcohol use: No      Family History   Problem Relation Age of Onset   ? Other Mother    ? Kidney Failure Mother    ? Osteoporosis Mother    ? Cancer Father         esophageal   ? Liver Disease Sister         endstage, s/p liver transplant   ? Cancer Brother         tonsil, liver    ? Hip Fracture Neg Hx       Medications Prior to Admission   Medication Sig Dispense Refill Last Dose   ? cetirizine (ZYRTEC) 10 mg tablet Take 10 mg by mouth daily as needed for Allergy  symptoms.      ? ciprofloxacin (CIPRO) 500 mg tablet Take one tablet by mouth daily. 30 tablet 1    ? duloxetine DR (CYMBALTA) 60 mg capsule Take 60 mg by mouth daily.      ? midodrine (PROAMITINE) 10 mg tablet Take one tablet by mouth three times daily. 90 tablet 1    ? pantoprazole DR (PROTONIX) 40 mg tablet TAKE 1 TABLET BY MOUTH TWICE A DAY 180 tablet 1    ? polyethylene glycol 3350 (MIRALAX) 17 g packet Take one packet by mouth twice daily.      ? promethazine (PHENERGAN) 25 mg tablet Take 25 mg by mouth every 4 hours as needed. Just uses it with Tramadol      ? rifAXIMin (XIFAXAN) 550 mg tablet Take one tablet by mouth twice daily. 180 tablet 3    ? zinc sulfate 220 mg (50 mg elemental zinc) capsule Take one capsule by mouth daily. 90 capsule 3      Allergies   Allergen Reactions   ? Tegaderm BLISTERS   ? Adhesive Tape (Rosins) RASH     Other reaction(s): irritated skin   ? Sulfa (Sulfonamide Antibiotics) RASH     Other reaction(s): Unknown Reaction   ? Codeine NAUSEA AND VOMITING   ? Morphine SEE COMMENTS     Pt reports burns her veins  Other reaction(s): Unknown Reaction   ? Penicillin G SEE COMMENTS     Throat issues, blisters in throat    ? Tramadol NAUSEA ONLY and ITCHING     Takes this medication regularly        Review of Systems  A comprehensive review of systems was negative.    Physical Exam:  Vital Signs: Last Filed In 24 Hours Vital Signs: 24 Hour Range   BP: 104/54 (06/10 1445)  Temp: 36.6 ?C (97.8 ?F) (06/10 1158)  Pulse: 96 (06/10 1445)  SpO2: 100 % (06/10 1158)  Height: 161.3 cm (63.5) (06/10 1158) BP: (104-108)/(54-56)   Temp:  [36.6 ?C (97.8 ?F)]   Pulse:  [96-98]   SpO2:  [100 %]           General appearance: alert and no distress noted. Neurologic: Grossly normal.  Lungs: Non labored.  Heart: regular rate and rhythm  Abdomen: distended    Pre-procedure anxiolysis plan: N/A  Sedation/Medication Plan: Local anesthetic  Personal history of sedation complications: Denies adverse event.   Family history of sedation complications: Denies adverse event.   Medications for Reversal: NA  Discussion/Reviews:  Physician has discussed risks and alternatives of this type of sedation and above planned procedures with patient    NPO Status: NA  Airway:  NA  Head and Neck: NA  Mouth: NA   Anesthesia Classification:  ASA III (A patient with a severe systemic disease that limits activity, but is not incapacitating)  Pregnancy Status: Not Pregnant    Lab/Radiology/Other Diagnostic Tests:  Labs:  Pertinent labs reviewed           Wesley Blas, APRN-NP  Pager [phone]

## 2020-04-29 NOTE — Patient Instructions
PARACENTESIS   A paracentesis is the removal of an abnormal buildup of fluid in your abdominal cavity. This fluid buildup is called ascites and may be caused by conditions such as liver disease, heart failure, or cancer. During this procedure, a needle is inserted into your abdomen to drain the fluid. The fluid may then be sent to the lab for testing if medically indicated. Removal of the fluid may also relieve belly pressure and shortness of breath caused by the ascites.?  POST-PROCEDURE PAIN:   ? Pain control following your procedure is a priority for both you and your Physicians.  ? Some soreness or tenderness at the site is to be expected for several days. We recommend taking over the counter analgesics to help relieve this pain.  ? Alternative methods for pain relief include but not limited to heat or cold compress, relaxation techniques, rest, and changing of positions.  ? If pain continues after 5-7 days or you have severe pain not relieved by medication, please contact us as directed below.?  POST-PROCEDURE ACTIVITY:   ? A responsible adult must drive you home.  ? If you receive sedation, narcotic pain medication or anesthesia for the procedure, you should not drive or operate heavy machinery or do anything that requires concentration for at least 24 hours after procedure completion.  ? It is recommended that a responsible adult be with you until morning.?  POST-PROCEDURE SITE CARE:   ? You will have a small bandage over the procedure site. Keep this dry.  ? You may remove it in 24 hours.  ? You may shower in 24 hours, after removing the bandage.  ? A dry gauze bandage may be reapplied as necessary to protect your clothing as the site may sometimes leak for several days after the procedure.  ? Do not submerge the procedure site for 1 week (no bathtub, swimming, hot tub, etc.)  ? Do not use ointments, creams or powders on the puncture site.  ? Be sure your hands are clean when touching near the site.? DIET/MEDICATIONS:   ? You may resume your previous diet after the procedure.  ? If you receive sedation or narcotic pain medications, avoid any foods or beverages containing alcohol for at least 24 hours after the procedure.  ? Please see the Medication Reconciliation sheet for instructions regarding resuming your home medications.?  CALL THE DOCTOR IF:   ? Bright red blood soaks the bandage.  ? You have pain not relieved by medication. Some soreness at the site is to be expected.  ? You have signs of infection such as: fever greater than 101F, chills, redness, warmth, swelling, drainage or pus from the puncture site.  For any of the above symptoms or for problems or concerns related to the procedure performed at the [place] Location, call [phone] Monday-Friday from 7-5p. After-hours and weekends, please call [phone] and ask for the Interventional Radiology Resident on-call.   You or your caregiver should call 911 for any severe symptoms such as excessive bleeding, severe dizziness, trouble breathing or loss of consciousness.   ?  ?

## 2020-05-03 ENCOUNTER — Ambulatory Visit: Admit: 2020-05-03 | Discharge: 2020-05-03 | Payer: MEDICARE

## 2020-05-03 ENCOUNTER — Encounter: Admit: 2020-05-03 | Discharge: 2020-05-03 | Payer: MEDICARE

## 2020-05-03 DIAGNOSIS — R945 Abnormal results of liver function studies: Secondary | ICD-10-CM

## 2020-05-03 DIAGNOSIS — K760 Fatty (change of) liver, not elsewhere classified: Secondary | ICD-10-CM

## 2020-05-03 DIAGNOSIS — D500 Iron deficiency anemia secondary to blood loss (chronic): Secondary | ICD-10-CM

## 2020-05-03 DIAGNOSIS — D649 Anemia, unspecified: Secondary | ICD-10-CM

## 2020-05-03 DIAGNOSIS — K7469 Other cirrhosis of liver: Secondary | ICD-10-CM

## 2020-05-03 DIAGNOSIS — K31819 Angiodysplasia of stomach and duodenum without bleeding: Secondary | ICD-10-CM

## 2020-05-03 LAB — COMPREHENSIVE METABOLIC PANEL
Lab: 1.4 mg/dL — ABNORMAL HIGH (ref 0.4–1.00)
Lab: 1.5 mg/dL — ABNORMAL HIGH (ref 0.3–1.2)
Lab: 10 10*3/uL — ABNORMAL HIGH (ref 3–12)
Lab: 136 MMOL/L — ABNORMAL LOW (ref 137–147)
Lab: 150 U/L — ABNORMAL HIGH (ref 25–110)
Lab: 206 mg/dL — ABNORMAL HIGH (ref 70–100)
Lab: 21 MMOL/L (ref 21–30)
Lab: 21 U/L — ABNORMAL LOW (ref 7–56)
Lab: 29 mg/dL — ABNORMAL HIGH (ref 7–25)
Lab: 3 g/dL — ABNORMAL LOW (ref 3.5–5.0)
Lab: 36 mL/min — ABNORMAL LOW (ref >60)
Lab: 39 U/L (ref 7–40)
Lab: 4.5 MMOL/L (ref 3.5–5.1)
Lab: 44 mL/min — ABNORMAL LOW (ref >60)
Lab: 5.5 g/dL — ABNORMAL LOW (ref 6.0–8.0)
Lab: 9 mg/dL (ref 8.5–10.6)

## 2020-05-03 LAB — CBC AND DIFF
Lab: 2.9 M/UL — ABNORMAL LOW (ref 4.0–5.0)
Lab: 7.5 g/dL — ABNORMAL LOW (ref 12.0–15.0)
Lab: 8.6 10*3/uL (ref 4.5–11.0)

## 2020-05-03 LAB — PROTIME INR (PT): Lab: 1.3 MMOL/L — ABNORMAL HIGH (ref 0.8–1.2)

## 2020-05-05 ENCOUNTER — Encounter: Admit: 2020-05-05 | Discharge: 2020-05-05 | Payer: MEDICARE

## 2020-05-05 DIAGNOSIS — Z20822 Encounter for screening laboratory testing for COVID-19 virus in asymptomatic patient: Secondary | ICD-10-CM

## 2020-05-06 ENCOUNTER — Encounter: Admit: 2020-05-06 | Discharge: 2020-05-06 | Payer: MEDICARE

## 2020-05-06 ENCOUNTER — Ambulatory Visit: Admit: 2020-05-06 | Discharge: 2020-05-06 | Payer: MEDICARE

## 2020-05-06 DIAGNOSIS — M25532 Pain in left wrist: Secondary | ICD-10-CM

## 2020-05-06 DIAGNOSIS — R0600 Dyspnea, unspecified: Secondary | ICD-10-CM

## 2020-05-06 DIAGNOSIS — K760 Fatty (change of) liver, not elsewhere classified: Secondary | ICD-10-CM

## 2020-05-06 DIAGNOSIS — J329 Chronic sinusitis, unspecified: Secondary | ICD-10-CM

## 2020-05-06 DIAGNOSIS — Z01810 Encounter for preprocedural cardiovascular examination: Secondary | ICD-10-CM

## 2020-05-06 DIAGNOSIS — I10 Essential (primary) hypertension: Secondary | ICD-10-CM

## 2020-05-06 DIAGNOSIS — S62347D Nondisplaced fracture of base of fifth metacarpal bone. left hand, subsequent encounter for fracture with routine healing: Secondary | ICD-10-CM

## 2020-05-06 DIAGNOSIS — E119 Type 2 diabetes mellitus without complications: Secondary | ICD-10-CM

## 2020-05-06 DIAGNOSIS — K5730 Diverticulosis of large intestine without perforation or abscess without bleeding: Secondary | ICD-10-CM

## 2020-05-06 DIAGNOSIS — K7460 Unspecified cirrhosis of liver: Secondary | ICD-10-CM

## 2020-05-06 DIAGNOSIS — I729 Aneurysm of unspecified site: Secondary | ICD-10-CM

## 2020-05-06 DIAGNOSIS — D500 Iron deficiency anemia secondary to blood loss (chronic): Secondary | ICD-10-CM

## 2020-05-06 DIAGNOSIS — R945 Abnormal results of liver function studies: Secondary | ICD-10-CM

## 2020-05-06 DIAGNOSIS — E669 Obesity, unspecified: Secondary | ICD-10-CM

## 2020-05-06 DIAGNOSIS — D649 Anemia, unspecified: Secondary | ICD-10-CM

## 2020-05-06 DIAGNOSIS — K7469 Other cirrhosis of liver: Secondary | ICD-10-CM

## 2020-05-06 DIAGNOSIS — K31819 Angiodysplasia of stomach and duodenum without bleeding: Secondary | ICD-10-CM

## 2020-05-06 LAB — COMPREHENSIVE METABOLIC PANEL
Lab: 1.2 mg/dL (ref 0.3–1.2)
Lab: 1.2 mg/dL — ABNORMAL HIGH (ref 0.4–1.00)
Lab: 106 MMOL/L (ref 98–110)
Lab: 111 mg/dL — ABNORMAL HIGH (ref 70–100)
Lab: 135 MMOL/L — ABNORMAL LOW (ref 137–147)
Lab: 171 U/L — ABNORMAL HIGH (ref 25–110)
Lab: 2.8 g/dL — ABNORMAL LOW (ref 3.5–5.0)
Lab: 20 MMOL/L — ABNORMAL LOW (ref 21–30)
Lab: 21 U/L (ref 7–56)
Lab: 31 mg/dL — ABNORMAL HIGH (ref 7–25)
Lab: 43 mL/min — ABNORMAL LOW (ref >60)
Lab: 5.4 g/dL — ABNORMAL LOW (ref 6.0–8.0)
Lab: 5.5 MMOL/L — ABNORMAL HIGH (ref 3.5–5.1)
Lab: 53 mL/min — ABNORMAL LOW (ref >60)
Lab: 73 U/L — ABNORMAL HIGH (ref 7–40)
Lab: 8.3 mg/dL — ABNORMAL LOW (ref 8.5–10.6)
Lab: 9 (ref 3–12)
Lab: 1.1 mg/dL (ref 0.3–1.2)
Lab: 1.1 mg/dL — ABNORMAL HIGH (ref 0.4–1.00)
Lab: 105 MMOL/L (ref 98–110)
Lab: 113 mg/dL — ABNORMAL HIGH (ref 70–100)
Lab: 135 MMOL/L — ABNORMAL LOW (ref 137–147)
Lab: 161 U/L — ABNORMAL HIGH (ref 25–110)
Lab: 17 U/L (ref 7–56)
Lab: 21 MMOL/L (ref 21–30)
Lab: 3.3 g/dL — ABNORMAL LOW (ref 3.5–5.0)
Lab: 31 mg/dL — ABNORMAL HIGH (ref 7–25)
Lab: 37 U/L (ref 7–40)
Lab: 4.3 MMOL/L (ref 3.5–5.1)
Lab: 47 mL/min — ABNORMAL LOW (ref >60)
Lab: 5.7 g/dL — ABNORMAL LOW (ref 6.0–8.0)
Lab: 57 mL/min — ABNORMAL LOW (ref >60)
Lab: 8.8 mg/dL (ref 8.5–10.6)
Lab: 9 (ref 3–12)

## 2020-05-06 LAB — GRAM STAIN

## 2020-05-06 LAB — CBC AND DIFF
Lab: 2.6 M/UL — ABNORMAL LOW (ref 4.0–5.0)
Lab: 7.3 g/dL — ABNORMAL LOW (ref 12.0–15.0)
Lab: 7.9 10*3/uL (ref 4.5–11.0)

## 2020-05-06 LAB — POC GLUCOSE: Lab: 126 mg/dL — ABNORMAL HIGH (ref 70–100)

## 2020-05-06 LAB — PROTIME INR (PT): Lab: 1.4 mg/dL — ABNORMAL HIGH (ref 0.8–1.2)

## 2020-05-06 MED ORDER — ALBUMIN, HUMAN 25 % IV SOLP
0 refills | Status: CP
Start: 2020-05-06 — End: ?
  Administered 2020-05-06 (×3): 12.5 g via INTRAVENOUS

## 2020-05-06 NOTE — Progress Notes
Date of Service: 05/06/2020    Subjective:                History of Present Illness    Kristin Moody is a 64 y.o. female.  She returns today for proximal fifth metacarpal fracture.  She has been in a short arm cast for 4 weeks.  She originally fractured the fifth metacarpal 4-1/2 weeks ago.  She notes improvement in overall pain.  Her skin is essentially healed at this point.  She had several abrasions after her fall. No other questions today.       Review of Systems   Musculoskeletal: Positive for arthralgias.   Neurological: Negative for weakness and numbness.   All other systems reviewed and are negative.        Objective:         ? cetirizine (ZYRTEC) 10 mg tablet Take 10 mg by mouth daily as needed for Allergy symptoms.   ? ciprofloxacin (CIPRO) 500 mg tablet Take one tablet by mouth daily.   ? duloxetine DR (CYMBALTA) 60 mg capsule Take 60 mg by mouth daily.   ? midodrine (PROAMITINE) 10 mg tablet Take one tablet by mouth three times daily.   ? pantoprazole DR (PROTONIX) 40 mg tablet TAKE 1 TABLET BY MOUTH TWICE A DAY   ? polyethylene glycol 3350 (MIRALAX) 17 g packet Take one packet by mouth twice daily.   ? promethazine (PHENERGAN) 25 mg tablet Take 25 mg by mouth every 4 hours as needed. Just uses it with Tramadol   ? rifAXIMin (XIFAXAN) 550 mg tablet Take one tablet by mouth twice daily.   ? zinc sulfate 220 mg (50 mg elemental zinc) capsule Take one capsule by mouth daily.     Vitals:    05/06/20 0828   BP: 127/59   Pulse: 99   SpO2: 100%   Weight: 78.9 kg (174 lb)   Height: 161.3 cm (63.5)   PainSc: Zero     Body mass index is 30.34 kg/m?Marland Kitchen     Physical Exam  Constitutional:       General: She is not in acute distress.     Appearance: She is well-developed. She is not diaphoretic.   HENT:      Head: Normocephalic and atraumatic.   Eyes:      Conjunctiva/sclera: Conjunctivae normal.   Pulmonary:      Effort: Pulmonary effort is normal.   Musculoskeletal:      Cervical back: Normal range of motion. Skin:     General: Skin is warm and dry.      Findings: No erythema.   Neurological:      Mental Status: She is alert and oriented to person, place, and time.       Left Hand Exam     Tenderness   Left hand tenderness location: minimal Tenderness at the base of the fifth metacarpal.     Range of Motion   Wrist   Extension: normal   Flexion: normal   Hand   MP Little: normal   PIP Little: normal   DIP Little: normal     Muscle Strength   Wrist extension: 5/5   Wrist flexion: 5/5   Grip:  5/5     Other   Erythema: absent  Scars: absent  Sensation: normal  Pulse: present                 Assessment and Plan:       Kristin Moody  was seen today for follow up.    Diagnoses and all orders for this visit:    Left wrist pain  -No significant displacement of the oblique fracture at the base of the fifth metacarpal.  Significant interval healing.  -Pain likely secondary to left fifth metacarpal fracture.  Improving with time and immobilization.  -Imaging: No further imaging today.  We will reimage in 3-4 weeks.  -Lifestyle modifications: Continue Exos splint with activity.  May come out of the splint for sedentary activities.    -Medication: Continue home medications.  -Therapy: Begin home range of motion exercises.  -Intervention: No further intervention at this time.  -Activity: As tolerated.  -RTO in 3-4 weeks or sooner PRN increased pain.  She will be 6+ weeks out from original injury at that time.  -The pt understands the above assessment and plan and has no further questions             Lynnda Child, MD    Parts of this clinic note were completed using a dragon voice recognition software system. Please excuse any misspellings or grammatical errors.  If you need clarification please contact my office.

## 2020-05-06 NOTE — H&P (View-Only)
IR Pre-Procedure History and Physical/Sedation Plan    Procedure Date: 05/06/2020     Planned Procedure(s):  Ultrasound-guided paracentesis     Indication:  Fluid testing; Therapeutic drainage  __________________________________________________________________    Chief Complaint:  Ascites    History of Present Illness: Kristin Moody is a 64 y.o. female with a history as listed below who presents today for procedure.    Patient Active Problem List    Diagnosis Date Noted   ? 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 Hypotension 02/10/2020   ? 'Light-for-dates' infant with signs of fetal malnutrition 01/22/2020   ? 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 Anemia 09/28/2019   ? Encephalopathy 08/12/2019   ? Spasticity 08/10/2019   ? Hyperreflexia 08/10/2019   ? 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 Right leg weakness 05/14/2019   ? 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 Iron (Fe) deficiency anemia 01/19/2019   ? Preop cardiovascular exam 01/07/2019   ? Pre-transplant evaluation for liver transplant 01/06/2019   ? Cirrhosis (HCC) 12/16/2018   ? Acute on chronic anemia 12/16/2018   ? Lactic acidosis 12/16/2018   ? GI bleed 10/24/2018   ? Portal hypertension (HCC) 08/28/2018   ? Confusion 08/27/2018   ? Dyspnea 08/27/2018   ? Chronic abdominal pain 08/27/2018   ? 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 Acute on chronic blood loss anemia 08/27/2018   ? Iron deficiency anemia 04/13/2018   ? Pneumonia due to infectious organism 04/13/2018   ? Melena 04/10/2018   ? Esophageal varices without bleeding (HCC) 02/25/2018   ? Cirrhosis of liver without ascites (HCC) 02/25/2018   ? 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 Lower abdominal pain 10/24/2014   ? Chest discomfort 09/21/2014   ? Essential hypertension 08/13/2012   ? Abnormal liver function tests 05/13/2011   ? Fatty liver disease, nonalcoholic 05/13/2011   ? Obesity (BMI 30-39.9) 05/13/2011   ? Slow transit constipation 05/13/2011     Medical History:   Diagnosis Date   ? Aneurysm (HCC)    ? 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 Diverticulosis of colon     descending and sigmoid colon   ? Dyspnea    ? Essential hypertension 08/13/2012   ? Fatty infiltration of liver    ? HTN (hypertension)    ? Obesity (BMI 30-39.9) 05/13/2011   ? Preop cardiovascular exam 01/07/2019   ? Sinus infection    ? Type 2 diabetes mellitus (HCC) 09/21/2014      Surgical History:   Procedure Laterality Date   ? HYSTERECTOMY  1994   ? UPPER GASTROINTESTINAL ENDOSCOPY  2009   ? COLONOSCOPY  2009   ? CYSTOCELE REPAIR  2009    with endocele repair   ? RECTOCELE REPAIR  03/2009   ? LIVER BIOPSY  07/17/2010   ? COLONOSCOPY N/A 11/08/2017    Performed by Dawna Part, MD at [facility] ENDO   ? ESOPHAGOGASTRODUODENOSCOPY N/A 11/08/2017    Performed by Dawna Part, MD at [facility] ENDO   ? ESOPHAGOGASTRODUODENOSCOPY WITH BIOPSY - FLEXIBLE  11/08/2017    Performed by Dawna Part, MD at [facility] ENDO   ? COLONOSCOPY WITH HOT BIOPSY FORCEPS REMOVAL TUMOR/ POLYP/ OTHER LESION  11/08/2017    Performed by Dawna Part, MD at [facility] ENDO   ? ESOPHAGOGASTRODUODENOSCOPY WITH BAND LIGATION ESOPHAGEAL/ GASTRIC  VARICES - FLEXIBLE N/A 04/10/2018    Performed by Normajean Baxter, MD at [facility] ENDO   ? ESOPHAGOGASTRODUODENOSCOPY WITH BIOPSY - FLEXIBLE N/A 04/10/2018    Performed by Normajean Baxter, MD at [facility] ENDO   ? ESOPHAGOGASTRODUODENOSCOPY WITH CONTROL OF BLEEDING - FLEXIBLE N/A 04/25/2018    Performed by Jolee Ewing, MD at [facility] ENDO   ? ESOPHAGOGASTRODUODENOSCOPY WITH BIOPSY - FLEXIBLE with push enteroscopy N/A 08/28/2018    Performed by Celesta Gentile, MD at [facility] ENDO   ? EGD N/A 10/27/2018    Performed by Eliott Nine, MD at [facility] ENDO   ? ESOPHAGOGASTRODUODENOSCOPY WITH SNARE REMOVAL TUMOR/ POLYP/ OTHER LESION - FLEXIBLE N/A 10/27/2018    Performed by Eliott Nine, MD at [facility] ENDO   ? ESOPHAGOGASTRODUODENOSCOPY WITH BIOPSY - FLEXIBLE N/A 12/16/2018    Performed by Buckles, Vinnie Level, MD at [facility] ENDO   ? ESOPHAGOGASTRODUODENOSCOPY WITH CONTROL OF BLEEDING - FLEXIBLE N/A 12/16/2018    Performed by Buckles, Vinnie Level, MD at [facility] ENDO   ? ESOPHAGOGASTRODUODENOSCOPY WITH SPECIMEN COLLECTION BY BRUSHING/ WASHING N/A 01/22/2019    Performed by Veneta Penton, MD at [facility] ENDO   ? ESOPHAGOGASTRODUODENOSCOPY WITH DILATION ESOPHAGUS WITH BALLOON 30 MM OR GREATER - FLEXIBLE N/A 01/22/2019    Performed by Veneta Penton, MD at [facility] ENDO   ? ESOPHAGOGASTRODUODENOSCOPY WITH BIOPSY - FLEXIBLE N/A 01/22/2019    Performed by Veneta Penton, MD at [facility] ENDO   ? ESOPHAGOGASTRODUODENOSCOPY WITH BIOPSY - FLEXIBLE N/A 06/19/2019    Performed by Dawna Part, MD at [facility] ENDO   ? ESOPHAGOGASTRODUODENOSCOPY [WITH APC]  WITH SPECIMEN COLLECTION BY BRUSHING/ WASHING N/A 08/07/2019    Performed by Dawna Part, MD at [facility] ENDO   ? ESOPHAGOGASTRODUODENOSCOPY WITH SPECIMEN COLLECTION BY BRUSHING/ WASHING N/A 09/10/2019    Performed by Buckles, Vinnie Level, MD at [facility] ENDO   ? ESOPHAGOGASTRODUODENOSCOPY WITH CONTROL OF BLEEDING - FLEXIBLE N/A 09/10/2019    Performed by Buckles, Vinnie Level, MD at [facility] ENDO   ? ESOPHAGOGASTRODUODENOSCOPY WITH CONTROL OF BLEEDING - FLEXIBLE N/A 02/12/2020    Performed by Lenor Derrick, MD at [facility] ENDO   ? ESOPHAGOGASTRODUODENOSCOPY WITH BIOPSY - FLEXIBLE N/A 02/26/2020    Performed by Buckles, Vinnie Level, MD at [facility] ENDO   ? CHOLECYSTECTOMY     ? COLONOSCOPY     ? ESOPHAGOGASTRIC FUNDOPLICATION  2003, 2004    laparoscopic   ? ESOPHAGOGASTRIC FUNDOPLICATION     ? HX HYSTERECTOMY     ? PR ESOPHAGOSCOPY FLEXIBLE TRANSORAL DIAGNOSTIC        Social History     Tobacco Use   ? Smoking status: Never Smoker   ? Smokeless tobacco: Never Used   Substance Use Topics   ? Alcohol use: No      Family History   Problem Relation Age of Onset   ? Other Mother    ? Kidney Failure Mother    ? Osteoporosis Mother    ? Cancer Father         esophageal   ? Liver Disease Sister         endstage, s/p liver transplant   ? Cancer Brother         tonsil, liver    ? Hip Fracture Neg Hx       Medications Prior to Admission   Medication Sig Dispense Refill Last Dose   ? cetirizine (ZYRTEC) 10 mg tablet Take 10 mg by mouth daily as needed for Allergy  symptoms.   05/06/2020   ? ciprofloxacin (CIPRO) 500 mg tablet Take one tablet by mouth daily. 30 tablet 1 05/06/2020   ? duloxetine DR (CYMBALTA) 60 mg capsule Take 60 mg by mouth daily.   05/06/2020   ? midodrine (PROAMITINE) 10 mg tablet Take one tablet by mouth three times daily. 90 tablet 1 05/06/2020   ? pantoprazole DR (PROTONIX) 40 mg tablet TAKE 1 TABLET BY MOUTH TWICE A DAY 180 tablet 1 05/06/2020   ? polyethylene glycol 3350 (MIRALAX) 17 g packet Take one packet by mouth twice daily.   05/05/2020   ? promethazine (PHENERGAN) 25 mg tablet Take 25 mg by mouth every 4 hours as needed. Just uses it with Tramadol   05/06/2020   ? rifAXIMin (XIFAXAN) 550 mg tablet Take one tablet by mouth twice daily. 180 tablet 3 05/06/2020   ? zinc sulfate 220 mg (50 mg elemental zinc) capsule Take one capsule by mouth daily. 90 capsule 3 05/06/2020     Allergies   Allergen Reactions   ? Tegaderm BLISTERS   ? Adhesive Tape (Rosins) RASH     Other reaction(s): irritated skin   ? Sulfa (Sulfonamide Antibiotics) RASH     Other reaction(s): Unknown Reaction   ? Codeine NAUSEA AND VOMITING   ? Morphine SEE COMMENTS     Pt reports burns her veins  Other reaction(s): Unknown Reaction   ? Penicillin G SEE COMMENTS     Throat issues, blisters in throat    ? Tramadol NAUSEA ONLY and ITCHING     Takes this medication regularly        Review of Systems  A comprehensive review of systems was negative.    Physical Exam:  Vital Signs: Last Filed In 24 Hours Vital Signs: 24 Hour Range   BP: 127/59 (06/18 0828)  Pulse: 99 (06/18 0828)  SpO2: 100 % (06/18 0828)  Height: 161.3 cm (63.5) (06/18 0828) BP: (127)/(59)   Pulse:  [99]   SpO2:  [100 %]           General appearance: alert and no distress noted. Neurologic: Grossly normal.  Lungs: Non labored.  Heart: regular rate and rhythm  Abdomen: distended    Pre-procedure anxiolysis plan: N/A  Sedation/Medication Plan: Local anesthetic  Personal history of sedation complications: Denies adverse event.   Family history of sedation complications: Denies adverse event.   Medications for Reversal: NA  Discussion/Reviews:  Physician has discussed risks and alternatives of this type of sedation and above planned procedures with patient    NPO Status: NA  Airway:  NA  Head and Neck: NA  Mouth: NA   Anesthesia Classification:  ASA III (A patient with a severe systemic disease that limits activity, but is not incapacitating)  Pregnancy Status: Not Pregnant    Lab/Radiology/Other Diagnostic Tests:  Labs:  Pertinent labs reviewed           Mirca Yale P Divine-Thiele, APRN-NP  Pager [phone]

## 2020-05-06 NOTE — Progress Notes
Pt DC with instructions and follow-up care explained. Pt understands education and has no additional questions at this time. PIV removed and pt wheeled off unit by tech without difficulty.

## 2020-05-06 NOTE — Other
Immediate Post Procedure Note    Date:  05/06/2020                                         Attending Physician:   Dr. Cameron Ali  Performing Provider:  Gwinda Passe, MD    Consent:  Consent obtained from patient.  Time out performed: Consent obtained, correct patient verified, correct procedure verified, correct site verified, patient marked as necessary.  Pre/Post Procedure Diagnosis:  ascites  Indications:  ascites      Procedure(s):  Ultrasound guided diagnostic and therapeutic paracentesis  Findings:  Large volume ascites. Successful paracentesis. removed, 60mL sent for testing     Estimated Blood Loss:  None/Negligible  Specimen(s) Removed/Disposition:  Yes, sent to pathology  Complications: None  Patient Tolerated Procedure: Well  Post-Procedure Condition:  stable    Gwinda Passe, MD  IR Pager [phone]

## 2020-05-06 NOTE — Patient Instructions
PARACENTESIS   A paracentesis is the removal of an abnormal buildup of fluid in your abdominal cavity. This fluid buildup is called ascites and may be caused by conditions such as liver disease, heart failure, or cancer. During this procedure, a needle is inserted into your abdomen to drain the fluid. The fluid may then be sent to the lab for testing if medically indicated. Removal of the fluid may also relieve belly pressure and shortness of breath caused by the ascites.?  POST-PROCEDURE PAIN:   ? Pain control following your procedure is a priority for both you and your Physicians.  ? Some soreness or tenderness at the site is to be expected for several days. We recommend taking over the counter analgesics to help relieve this pain.  ? Alternative methods for pain relief include but not limited to heat or cold compress, relaxation techniques, rest, and changing of positions.  ? If pain continues after 5-7 days or you have severe pain not relieved by medication, please contact us as directed below.?  POST-PROCEDURE ACTIVITY:   ? A responsible adult must drive you home.  ? If you receive sedation, narcotic pain medication or anesthesia for the procedure, you should not drive or operate heavy machinery or do anything that requires concentration for at least 24 hours after procedure completion.  ? It is recommended that a responsible adult be with you until morning.?  POST-PROCEDURE SITE CARE:   ? You will have a small bandage over the procedure site. Keep this dry.  ? You may remove it in 24 hours.  ? You may shower in 24 hours, after removing the bandage.  ? A dry gauze bandage may be reapplied as necessary to protect your clothing as the site may sometimes leak for several days after the procedure.  ? Do not submerge the procedure site for 1 week (no bathtub, swimming, hot tub, etc.)  ? Do not use ointments, creams or powders on the puncture site.  ? Be sure your hands are clean when touching near the site.? DIET/MEDICATIONS:   ? You may resume your previous diet after the procedure.  ? If you receive sedation or narcotic pain medications, avoid any foods or beverages containing alcohol for at least 24 hours after the procedure.  ? Please see the Medication Reconciliation sheet for instructions regarding resuming your home medications.?  CALL THE DOCTOR IF:   ? Bright red blood soaks the bandage.  ? You have pain not relieved by medication. Some soreness at the site is to be expected.  ? You have signs of infection such as: fever greater than 101F, chills, redness, warmth, swelling, drainage or pus from the puncture site.  For any of the above symptoms or for problems or concerns related to the procedure performed at the [place] Location, call [phone] Monday-Friday from 7-5p. After-hours and weekends, please call [phone] and ask for the Interventional Radiology Resident on-call.   You or your caregiver should call 911 for any severe symptoms such as excessive bleeding, severe dizziness, trouble breathing or loss of consciousness.   ?  ?

## 2020-05-10 ENCOUNTER — Ambulatory Visit: Admit: 2020-05-10 | Discharge: 2020-05-10 | Payer: MEDICARE

## 2020-05-10 ENCOUNTER — Encounter: Admit: 2020-05-10 | Discharge: 2020-05-10 | Payer: MEDICARE

## 2020-05-10 DIAGNOSIS — K7469 Other cirrhosis of liver: Secondary | ICD-10-CM

## 2020-05-10 DIAGNOSIS — K760 Fatty (change of) liver, not elsewhere classified: Secondary | ICD-10-CM

## 2020-05-10 DIAGNOSIS — D500 Iron deficiency anemia secondary to blood loss (chronic): Secondary | ICD-10-CM

## 2020-05-10 DIAGNOSIS — K31819 Angiodysplasia of stomach and duodenum without bleeding: Secondary | ICD-10-CM

## 2020-05-10 DIAGNOSIS — R945 Abnormal results of liver function studies: Secondary | ICD-10-CM

## 2020-05-10 DIAGNOSIS — D649 Anemia, unspecified: Secondary | ICD-10-CM

## 2020-05-10 LAB — CBC AND DIFF
Lab: 2.6 M/UL — ABNORMAL LOW (ref 4.0–5.0)
Lab: 23 % — ABNORMAL LOW (ref 36–45)
Lab: 7.8 K/UL (ref 4.5–11.0)

## 2020-05-10 LAB — COMPREHENSIVE METABOLIC PANEL
Lab: 1.1 mg/dL — ABNORMAL HIGH (ref 0.3–1.2)
Lab: 1.1 mg/dL — ABNORMAL HIGH (ref 0.4–1.00)
Lab: 104 MMOL/L — ABNORMAL LOW (ref 98–110)
Lab: 133 MMOL/L — ABNORMAL LOW (ref 137–147)
Lab: 167 mg/dL — ABNORMAL HIGH (ref 70–100)
Lab: 176 U/L — ABNORMAL HIGH (ref 25–110)
Lab: 19 U/L — ABNORMAL HIGH (ref 7–56)
Lab: 2.8 g/dL — ABNORMAL LOW (ref 3.5–5.0)
Lab: 22 MMOL/L — ABNORMAL LOW (ref 21–30)
Lab: 32 mg/dL — ABNORMAL HIGH (ref 7–25)
Lab: 4.6 MMOL/L (ref 3.5–5.1)
Lab: 40 U/L (ref 7–40)
Lab: 46 mL/min — ABNORMAL LOW (ref >60)
Lab: 5.2 g/dL — ABNORMAL LOW (ref 6.0–8.0)
Lab: 56 mL/min — ABNORMAL LOW (ref >60)
Lab: 7 K/UL (ref 3–12)
Lab: 8.5 mg/dL (ref 8.5–10.6)

## 2020-05-10 LAB — PROTIME INR (PT): Lab: 1.4 g/dL — ABNORMAL HIGH (ref 0.8–1.2)

## 2020-05-13 ENCOUNTER — Ambulatory Visit: Admit: 2020-05-13 | Discharge: 2020-05-14 | Payer: MEDICARE

## 2020-05-13 ENCOUNTER — Encounter: Admit: 2020-05-13 | Discharge: 2020-05-13 | Payer: MEDICARE

## 2020-05-13 ENCOUNTER — Ambulatory Visit: Admit: 2020-05-13 | Discharge: 2020-05-13 | Payer: MEDICARE

## 2020-05-13 DIAGNOSIS — R945 Abnormal results of liver function studies: Secondary | ICD-10-CM

## 2020-05-13 DIAGNOSIS — D649 Anemia, unspecified: Secondary | ICD-10-CM

## 2020-05-13 DIAGNOSIS — K7469 Other cirrhosis of liver: Secondary | ICD-10-CM

## 2020-05-13 DIAGNOSIS — K760 Fatty (change of) liver, not elsewhere classified: Secondary | ICD-10-CM

## 2020-05-13 DIAGNOSIS — D500 Iron deficiency anemia secondary to blood loss (chronic): Principal | ICD-10-CM

## 2020-05-13 DIAGNOSIS — K31819 Angiodysplasia of stomach and duodenum without bleeding: Secondary | ICD-10-CM

## 2020-05-13 DIAGNOSIS — K7460 Unspecified cirrhosis of liver: Secondary | ICD-10-CM

## 2020-05-13 LAB — COMPREHENSIVE METABOLIC PANEL
Lab: 1.1 mg/dL — AB (ref 0.3–1.2)
Lab: 1.1 mg/dL — ABNORMAL HIGH (ref 0.4–1.00)
Lab: 103 MMOL/L (ref 98–110)
Lab: 132 MMOL/L — ABNORMAL LOW (ref 137–147)
Lab: 139 U/L — ABNORMAL HIGH (ref 25–110)
Lab: 181 mg/dL — ABNORMAL HIGH (ref 70–100)
Lab: 2.7 g/dL — ABNORMAL LOW (ref 3.5–5.0)
Lab: 20 U/L (ref 7–56)
Lab: 21 MMOL/L (ref 21–30)
Lab: 38 U/L (ref 7–40)
Lab: 39 mg/dL — ABNORMAL HIGH (ref 7–25)
Lab: 4.4 MMOL/L (ref 3.5–5.1)
Lab: 46 mL/min — ABNORMAL LOW (ref >60)
Lab: 5.4 g/dL — ABNORMAL LOW (ref 6.0–8.0)
Lab: 56 mL/min — ABNORMAL LOW (ref >60)
Lab: 8.2 mg/dL — ABNORMAL LOW (ref 8.5–10.6)
Lab: 8 (ref 3–12)

## 2020-05-13 LAB — CBC AND DIFF
Lab: 0.6 10*3/uL — ABNORMAL LOW (ref 1.0–4.8)
Lab: 1 % (ref 0–2)
Lab: 1 % (ref 0–5)
Lab: 11 % (ref 4–12)
Lab: 2.4 M/UL — ABNORMAL LOW (ref 4.0–5.0)
Lab: 21 % — ABNORMAL LOW (ref 36–45)
Lab: 267 10*3/uL (ref 150–400)
Lab: 28 pg (ref 26–34)
Lab: 29 % — ABNORMAL HIGH (ref 11–15)
Lab: 32 g/dL (ref 32.0–36.0)
Lab: 6.3 10*3/uL (ref 1.8–7.0)
Lab: 6.8 g/dL — ABNORMAL LOW (ref 12.0–15.0)
Lab: 79 % — ABNORMAL HIGH (ref 41–77)
Lab: 8.1 10*3/uL (ref 4.5–11.0)
Lab: 8.5 FL (ref 7–11)
Lab: 86 FL (ref 80–100)

## 2020-05-13 LAB — GRAM STAIN

## 2020-05-13 LAB — PROTIME INR (PT): Lab: 1.4 % — ABNORMAL HIGH (ref 0.8–1.2)

## 2020-05-13 MED ORDER — ALBUMIN, HUMAN 25 % IV SOLP
0 refills | Status: CP
Start: 2020-05-13 — End: ?

## 2020-05-13 NOTE — Progress Notes
Paged Dr. Ladona Ridgel regarding low hemoglobin (6.8). Beverely Risen, RN scheduled infusion appointment for 6/25 1300. Type and Cross drawn.

## 2020-05-13 NOTE — Other
Immediate Post Procedure Note    Date:  05/13/2020                                         Attending Physician:   Nicholaus Corolla, MD  Performing Provider:  Rene Paci, MD    Consent:  Consent obtained from patient.  Time out performed: Consent obtained, correct patient verified, correct procedure verified, correct site verified, patient marked as necessary.  Pre/Post Procedure Diagnosis:  Ascites  Indications:  Ascites      Procedure(s):  US guided diagnostic and therapeutic paracentesis  Findings:  Successful US guided diagnostic and therapeutic paracentesis     Estimated Blood Loss:  None/Negligible  Specimen(s) Removed/Disposition:  Yes, sent to pathology  Complications: None  Patient Tolerated Procedure: Well  Post-Procedure Condition:  stable    Rene Paci, MD

## 2020-05-13 NOTE — Patient Instructions
[facility]  OUTPATIENT POST TRANSFUSION INSTRUCTIONS     During your transfusion you were monitored by nursing staff for signs of a transfusion reaction, however, you should continue to observe for the following symptoms after you have been dismissed from the clinic:     FEVER OF 100.5 OR HIGHER  CHILLS  NAUSEA OR VOMITING  FEELING FAINT OR DIZZY  SHORTNESS OF BREATH  DARK OR RED COLORED URINE  CHEST OR BACK PAIN  SHOCK OR LOSS OF CONSCIOUSNESS  YELLOWING OF THE EYES OR SKIN     If you or your caregiver notice any of these symptoms, contact your ordering physician immediately and go to the nearest Emergency Department for treatment. When you arrive at the Emergency Department notify them that you recently received a blood transfusion. *For more detailed signs and symptoms of a transfusion reaction, please refer to the [place] education information below.          When You Need a Blood Transfusion (Adult)  A blood transfusion may be done when you have lost blood because of an injury or during surgery. It can also be done because of diseases or conditions that affect the blood. Blood is made up of several different parts (blood products). You may receive some or all of these blood products during a transfusion. Blood for transfusion is usually donated from another person (donor). Strict measures are taken to make sure that donated blood is safe before it's given to you. This sheet helps you understand how a blood transfusion is done. Your healthcare provider will discuss your condition with you and answer your questions.   The parts of blood  Blood can be broken down into different parts that perform special roles in the body. These parts include:  ? Red blood cells, which carry oxygen throughout the body.  ? Platelets, which help stop bleeding.  ? Plasma (the liquid part of blood), which carries red blood cells and platelets throughout the body. Plasma also helps platelets in stopping bleeding. Where does donated blood come from?  ? Volunteer donors. These are people who donate their blood to help others in need of blood. Blood donation can take place at several places, including a hospital, blood bank, or during a blood drive.  ? Directed donation. If you need a blood transfusion during a planned surgery, family and friends can have their blood tested for compatibility and donate blood for you before the surgery. This needs to be done at least 7 day(s) in advance. This is because the blood must be tested for safety.  ? Autologous donation. This is also called self-donation. For planned surgery, you can donate your own blood starting up to 6 weeks before surgery.  Are blood transfusions safe?  Donated blood is tested and processed to make sure that the blood is safe:  ? The health and medical history of each donor is carefully screened. If a person is considered high-risk for infection or problems, he or she isn't accepted as a blood donor.  ? Donated blood is tested for infections such as hepatitis, syphilis, and HIV (the virus that causes AIDS). If the tested blood is found to be unsafe, it's destroyed.  ? Blood is divided into four types: A, B, AB, and O. Blood also has Rh types: positive (+) and negative (-). You can only receive blood products that are compatible with (match) your blood type. A sample of your blood is tested for compatibility with donated blood. This is  done before blood products are prepared for a transfusion.  How is a blood transfusion done?  A blood transfusion takes place in a blood center, infusion center, hospital room, or operating room. Your healthcare provider will discuss the blood transfusion with you before it's done. You'll need to give permission for the blood transfusion by signing a consent form.  ? Two healthcare providers confirm your identity. They also confirm that they have the correct blood product(s) for you.  ? An intravenous (IV) line is placed in a vein if you do not already have an IV.  ? The blood product comes in a plastic bag that is hung on an IV pole. The blood product flows from the bag into your IV line. The IV line may be connected to a pump, which controls the transfusion rate. You may receive more than one kind of blood product through the IV.  ? Your vital signs (blood pressure, heart rate, respiratory rate, and temperature) are checked throughout the transfusion. This is to make sure you are not having a reaction to the blood product.  ? The IV line may be removed once the transfusion is complete.  Possible risks and complications of blood transfusions  Most transfusions are problem free. In some cases, reactions occur. These can happen within seconds to minutes during the transfusion or a week to a few months after the transfusion. Call your doctor or nurse right away if you have any of the signs or symptoms in the table below during or after a transfusion:  Reaction Timing Symptoms   Allergic reaction (mild) ? Within seconds to minutes during the transfusion  ? Up to 24 hours after the transfusion Hives or red welts on the skin, mild itching, rash, localized swelling, flushing (red face), wheezing, shortness of breath, or stridor (high-pitched noise or sound)   Anaphylactic reaction ? Within seconds to minutes during the transfusion  ? Up to 24 hours after the transfusion Shortness of breath, flushing (red face), wheezing, labored (working hard) breathing, low blood pressure, localized swelling, chest tightness, or cramps   Febrile nonhemolytic reaction ? Within minutes to hours during the transfusion  ? Within a few hours to 24 hours after the transfusion Fever (increase of 1? C or higher), chills, flushing (red face), nausea, headache, minor discomfort, or mild shortness of breath   Acute immune hemolytic reaction ? Within minutes during the transfusion  ? Up to 24 hours after the transfusion Fever, red or brown urine, back pain, fast heart rate (tachycardia), abdominal pain, low blood pressure, feeling anxious, chills, chest pain, nausea, or fainting spells   Transfusion-related acute lung injury (TRALI) ? Within 1 to 2 hours during the transfusion  ? Up to 6 hours after the transfusion Shortness of breath, trouble breathing, low blood pressure, fever, pulmonary edema   Transfusion-associated circulatory overload ? Near the end of the transfusion  ? Within 6 hours after the transfusion Shortness of breath, fast heart rate (tachycardia), problems breathing when lying on back, abnormal blood pressure   Post-transfusion purpura (PUP) ? Within 1 week  ? Up to 48 days after the transfusion Purple spots on skin; nose bleed; bleeding from the urinary tract, abdomen, colon, or rectum; fever; or chills     Delayed transfusion-related acute lung injury (TRALI) ? Within 72 hours (3 days) after the transfusion Sudden onset of respiratory distress or trouble breathing   Delayed hemolytic reaction ? Within 3 to 7 days  ? Up to weeks after the  transfusion Low-grade fever, mild jaundice (yellowing of the skin and whites of the eyes), decrease in hematocrit, chills, chest pain, back pain, nausea   ? 2000-2019 [employer], [place]. [address] [place], [place] [address]. All rights reserved. This information is not intended as a substitute for professional medical care. Always follow your healthcare professional's instructions.

## 2020-05-13 NOTE — Progress Notes
Pt site clean, dry and intact. Discharge instructions reviewed. Pt has driver who is taking her to Infusion clinic appt. IV left in LAC.

## 2020-05-13 NOTE — H&P (View-Only)
IR Pre-Procedure History and Physical/Sedation Plan    Procedure Date: 05/13/2020     Planned Procedure(s):  Ultrasound-guided paracentesis     Indication:  Fluid testing; Therapeutic drainage  __________________________________________________________________    Chief Complaint:  Ascites    History of Present Illness: Kristin Moody is a 64 y.o. female with a history as listed below who presents today for procedure.    Patient Active Problem List    Diagnosis Date Noted   ? 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 Hypotension 02/10/2020   ? 'Light-for-dates' infant with signs of fetal malnutrition 01/22/2020   ? 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 Anemia 09/28/2019   ? Encephalopathy 08/12/2019   ? Spasticity 08/10/2019   ? Hyperreflexia 08/10/2019   ? 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 Right leg weakness 05/14/2019   ? 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 Iron (Fe) deficiency anemia 01/19/2019   ? Preop cardiovascular exam 01/07/2019   ? Pre-transplant evaluation for liver transplant 01/06/2019   ? Cirrhosis (HCC) 12/16/2018   ? Acute on chronic anemia 12/16/2018   ? Lactic acidosis 12/16/2018   ? GI bleed 10/24/2018   ? Portal hypertension (HCC) 08/28/2018   ? Confusion 08/27/2018   ? Dyspnea 08/27/2018   ? Chronic abdominal pain 08/27/2018   ? 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 Acute on chronic blood loss anemia 08/27/2018   ? Iron deficiency anemia 04/13/2018   ? Pneumonia due to infectious organism 04/13/2018   ? Melena 04/10/2018   ? Esophageal varices without bleeding (HCC) 02/25/2018   ? Cirrhosis of liver without ascites (HCC) 02/25/2018   ? 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 Lower abdominal pain 10/24/2014   ? Chest discomfort 09/21/2014   ? Essential hypertension 08/13/2012   ? Abnormal liver function tests 05/13/2011   ? Fatty liver disease, nonalcoholic 05/13/2011   ? Obesity (BMI 30-39.9) 05/13/2011   ? Slow transit constipation 05/13/2011     Medical History:   Diagnosis Date   ? Aneurysm (HCC)    ? 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 Diverticulosis of colon     descending and sigmoid colon   ? Dyspnea    ? Essential hypertension 08/13/2012   ? Fatty infiltration of liver    ? HTN (hypertension)    ? Obesity (BMI 30-39.9) 05/13/2011   ? Preop cardiovascular exam 01/07/2019   ? Sinus infection    ? Type 2 diabetes mellitus (HCC) 09/21/2014      Surgical History:   Procedure Laterality Date   ? HYSTERECTOMY  1994   ? UPPER GASTROINTESTINAL ENDOSCOPY  2009   ? COLONOSCOPY  2009   ? CYSTOCELE REPAIR  2009    with endocele repair   ? RECTOCELE REPAIR  03/2009   ? LIVER BIOPSY  07/17/2010   ? COLONOSCOPY N/A 11/08/2017    Performed by Dawna Part, MD at [facility] ENDO   ? ESOPHAGOGASTRODUODENOSCOPY N/A 11/08/2017    Performed by Dawna Part, MD at [facility] ENDO   ? ESOPHAGOGASTRODUODENOSCOPY WITH BIOPSY - FLEXIBLE  11/08/2017    Performed by Dawna Part, MD at [facility] ENDO   ? COLONOSCOPY WITH HOT BIOPSY FORCEPS REMOVAL TUMOR/ POLYP/ OTHER LESION  11/08/2017    Performed by Dawna Part, MD at [facility] ENDO   ? ESOPHAGOGASTRODUODENOSCOPY WITH BAND LIGATION ESOPHAGEAL/ GASTRIC  VARICES - FLEXIBLE N/A 04/10/2018    Performed by Normajean Baxter, MD at [facility] ENDO   ? ESOPHAGOGASTRODUODENOSCOPY WITH BIOPSY - FLEXIBLE N/A 04/10/2018    Performed by Normajean Baxter, MD at [facility] ENDO   ? ESOPHAGOGASTRODUODENOSCOPY WITH CONTROL OF BLEEDING - FLEXIBLE N/A 04/25/2018    Performed by Jolee Ewing, MD at [facility] ENDO   ? ESOPHAGOGASTRODUODENOSCOPY WITH BIOPSY - FLEXIBLE with push enteroscopy N/A 08/28/2018    Performed by Celesta Gentile, MD at [facility] ENDO   ? EGD N/A 10/27/2018    Performed by Eliott Nine, MD at [facility] ENDO   ? ESOPHAGOGASTRODUODENOSCOPY WITH SNARE REMOVAL TUMOR/ POLYP/ OTHER LESION - FLEXIBLE N/A 10/27/2018    Performed by Eliott Nine, MD at [facility] ENDO   ? ESOPHAGOGASTRODUODENOSCOPY WITH BIOPSY - FLEXIBLE N/A 12/16/2018    Performed by Buckles, Vinnie Level, MD at [facility] ENDO   ? ESOPHAGOGASTRODUODENOSCOPY WITH CONTROL OF BLEEDING - FLEXIBLE N/A 12/16/2018    Performed by Buckles, Vinnie Level, MD at [facility] ENDO   ? ESOPHAGOGASTRODUODENOSCOPY WITH SPECIMEN COLLECTION BY BRUSHING/ WASHING N/A 01/22/2019    Performed by Veneta Penton, MD at [facility] ENDO   ? ESOPHAGOGASTRODUODENOSCOPY WITH DILATION ESOPHAGUS WITH BALLOON 30 MM OR GREATER - FLEXIBLE N/A 01/22/2019    Performed by Veneta Penton, MD at [facility] ENDO   ? ESOPHAGOGASTRODUODENOSCOPY WITH BIOPSY - FLEXIBLE N/A 01/22/2019    Performed by Veneta Penton, MD at [facility] ENDO   ? ESOPHAGOGASTRODUODENOSCOPY WITH BIOPSY - FLEXIBLE N/A 06/19/2019    Performed by Dawna Part, MD at [facility] ENDO   ? ESOPHAGOGASTRODUODENOSCOPY [WITH APC]  WITH SPECIMEN COLLECTION BY BRUSHING/ WASHING N/A 08/07/2019    Performed by Dawna Part, MD at [facility] ENDO   ? ESOPHAGOGASTRODUODENOSCOPY WITH SPECIMEN COLLECTION BY BRUSHING/ WASHING N/A 09/10/2019    Performed by Buckles, Vinnie Level, MD at [facility] ENDO   ? ESOPHAGOGASTRODUODENOSCOPY WITH CONTROL OF BLEEDING - FLEXIBLE N/A 09/10/2019    Performed by Buckles, Vinnie Level, MD at [facility] ENDO   ? ESOPHAGOGASTRODUODENOSCOPY WITH CONTROL OF BLEEDING - FLEXIBLE N/A 02/12/2020    Performed by Lenor Derrick, MD at [facility] ENDO   ? ESOPHAGOGASTRODUODENOSCOPY WITH BIOPSY - FLEXIBLE N/A 02/26/2020    Performed by Buckles, Vinnie Level, MD at [facility] ENDO   ? CHOLECYSTECTOMY     ? COLONOSCOPY     ? ESOPHAGOGASTRIC FUNDOPLICATION  2003, 2004    laparoscopic   ? ESOPHAGOGASTRIC FUNDOPLICATION     ? HX HYSTERECTOMY     ? PR ESOPHAGOSCOPY FLEXIBLE TRANSORAL DIAGNOSTIC        Social History     Tobacco Use   ? Smoking status: Never Smoker   ? Smokeless tobacco: Never Used   Substance Use Topics   ? Alcohol use: No      Family History   Problem Relation Age of Onset   ? Other Mother    ? Kidney Failure Mother    ? Osteoporosis Mother    ? Cancer Father         esophageal   ? Liver Disease Sister         endstage, s/p liver transplant   ? Cancer Brother         tonsil, liver    ? Hip Fracture Neg Hx       Medications Prior to Admission   Medication Sig Dispense Refill Last Dose   ? cetirizine (ZYRTEC) 10 mg tablet Take 10 mg by mouth daily as needed for Allergy  symptoms.   05/12/2020   ? ciprofloxacin (CIPRO) 500 mg tablet Take one tablet by mouth daily. 30 tablet 1 05/13/2020   ? duloxetine DR (CYMBALTA) 60 mg capsule Take 60 mg by mouth daily.   05/12/2020   ? midodrine (PROAMITINE) 10 mg tablet Take one tablet by mouth three times daily. 90 tablet 1 05/13/2020   ? pantoprazole DR (PROTONIX) 40 mg tablet TAKE 1 TABLET BY MOUTH TWICE A DAY 180 tablet 1 05/13/2020   ? polyethylene glycol 3350 (MIRALAX) 17 g packet Take one packet by mouth twice daily.      ? promethazine (PHENERGAN) 25 mg tablet Take 25 mg by mouth every 4 hours as needed. Just uses it with Tramadol      ? rifAXIMin (XIFAXAN) 550 mg tablet Take one tablet by mouth twice daily. 180 tablet 3 05/13/2020   ? zinc sulfate 220 mg (50 mg elemental zinc) capsule Take one capsule by mouth daily. 90 capsule 3 05/13/2020     Allergies   Allergen Reactions   ? Tegaderm BLISTERS   ? Adhesive Tape (Rosins) RASH     Other reaction(s): irritated skin   ? Sulfa (Sulfonamide Antibiotics) RASH     Other reaction(s): Unknown Reaction   ? Codeine NAUSEA AND VOMITING   ? Morphine SEE COMMENTS     Pt reports burns her veins  Other reaction(s): Unknown Reaction   ? Penicillin G SEE COMMENTS     Throat issues, blisters in throat    ? Tramadol NAUSEA ONLY and ITCHING     Takes this medication regularly        Review of Systems  A comprehensive review of systems was negative.    Physical Exam:  Vital Signs: Last Filed In 24 Hours Vital Signs: 24 Hour Range   BP: 115/62 (06/25 1030)  Temp: 36.4 ?C (97.5 ?F) (06/25 1030)  Pulse: 99 (06/25 1030)  Respirations: 24 PER MINUTE (06/25 1030)  SpO2: 100 % (06/25 1030)  SpO2 Pulse: 99 (06/25 1030) BP: (115)/(62)   Temp:  [36.4 ?C (97.5 ?F)]   Pulse: [99]   Respirations:  [24 PER MINUTE]   SpO2:  [100 %]    Intensity Pain Scale (Self Report): 7 (05/13/20 1030)      General appearance: alert and no distress noted.  Neurologic: Grossly normal.  Lungs: Non labored.  Heart: regular rate and rhythm  Abdomen: distended    Pre-procedure anxiolysis plan: N/A  Sedation/Medication Plan: Local anesthetic  Personal history of sedation complications: Denies adverse event.   Family history of sedation complications: Denies adverse event.   Medications for Reversal: NA  Discussion/Reviews:  Physician has discussed risks and alternatives of this type of sedation and above planned procedures with patient    NPO Status: NA  Airway:  NA  Head and Neck: NA  Mouth: NA   Anesthesia Classification:  ASA III (A patient with a severe systemic disease that limits activity, but is not incapacitating)  Pregnancy Status: N/A    Lab/Radiology/Other Diagnostic Tests:  Labs:    Hematology:    Lab Results   Component Value Date    HGB 7.3 05/10/2020    HCT 23.9 05/10/2020    PLTCT 193 05/10/2020    WBC 7.8 05/10/2020    NEUT 74 05/10/2020    ANC 5.67 05/10/2020    LYMPH 9.3 02/23/2020    ALC 0.81 05/10/2020    MONA 13 05/10/2020    AMC 1.04 05/10/2020    ABC 0.11  05/10/2020    BASOPHILS 1.2 02/23/2020    MCV 89.5 05/10/2020    MCHC 30.6 05/10/2020    MPV 8.8 05/10/2020    RDW 31.9 05/10/2020   , Coagulation:    Lab Results   Component Value Date    PT 15.8 02/23/2020    PTT 32.8 02/25/2020    INR 1.4 05/10/2020    and General Chemistry:    Lab Results   Component Value Date    NA 133 05/10/2020    K 4.6 05/10/2020    CL 104 05/10/2020    GAP 7 05/10/2020    BUN 32 05/10/2020    CR 1.18 05/10/2020    GLU 167 05/10/2020    GLU 172 02/23/2020    CA 8.5 05/10/2020    ALBUMIN 2.8 05/10/2020    LACTIC 1.3 02/11/2020    MG 2.4 08/14/2019    TOTBILI 1.1 05/10/2020              Philmore Pali, APRN-NP  Pager [phone]

## 2020-05-16 ENCOUNTER — Encounter: Admit: 2020-05-16 | Discharge: 2020-05-17 | Payer: MEDICARE

## 2020-05-16 ENCOUNTER — Encounter: Admit: 2020-05-16 | Discharge: 2020-05-16 | Payer: MEDICARE

## 2020-05-16 ENCOUNTER — Ambulatory Visit: Admit: 2020-05-16 | Discharge: 2020-05-16 | Payer: MEDICARE

## 2020-05-16 LAB — COMPREHENSIVE METABOLIC PANEL
Lab: 1.2 mg/dL — ABNORMAL HIGH (ref 0.4–1.00)
Lab: 1.3 mg/dL — ABNORMAL HIGH (ref 0.3–1.2)
Lab: 103 MMOL/L — ABNORMAL HIGH (ref 98–110)
Lab: 109 mg/dL — ABNORMAL HIGH (ref 70–100)
Lab: 134 MMOL/L — ABNORMAL LOW (ref 137–147)
Lab: 135 U/L — ABNORMAL HIGH (ref 25–110)
Lab: 2.8 g/dL — ABNORMAL LOW (ref 3.5–5.0)
Lab: 22 MMOL/L — ABNORMAL HIGH (ref 21–30)
Lab: 22 U/L (ref 7–56)
Lab: 37 mg/dL — ABNORMAL HIGH (ref 7–25)
Lab: 4.8 MMOL/L — ABNORMAL LOW (ref 3.5–5.1)
Lab: 43 U/L — ABNORMAL HIGH (ref 7–40)
Lab: 45 mL/min — ABNORMAL LOW (ref >60)
Lab: 5.4 g/dL — ABNORMAL LOW (ref 6.0–8.0)
Lab: 55 mL/min — ABNORMAL LOW (ref >60)
Lab: 8.1 mg/dL — ABNORMAL LOW (ref 8.5–10.6)
Lab: 9 10*3/uL (ref 3–12)

## 2020-05-16 LAB — CBC AND DIFF
Lab: 2.7 M/UL — ABNORMAL LOW (ref 4.0–5.0)
Lab: 7.4 g/dL — ABNORMAL LOW (ref 12.0–15.0)
Lab: 8.5 10*3/uL (ref 4.5–11.0)
Lab: 86 FL (ref 80–100)

## 2020-05-16 LAB — PROTIME INR (PT): Lab: 1.4 % — ABNORMAL HIGH (ref 0.8–1.2)

## 2020-05-17 NOTE — Progress Notes
Interventional Radiology Outpatient Scheduling Checklist  ?  ? 1.? Name of Procedure(s):??Paracentesis   ?  ? 2.? Date of Procedure:?? 7/9,  7/16, 7/23, 7/30  ?  ? 3.? Arrival Time:??  1100, 1000, 1000 1000  ?  ? 4.? Procedure Time:?  1200, 1100 1100 1100  ?  ? 5.? Correct Procedural Room Assignment:? BH 6  ?  ? 6.? Blood Thinners Triaged and instructed per protocol: Y/N/NA:  NA  Confirmed accurate instructions sent to patient: Y/N:  NA   ?  ? 7.? Procedure Order Verified: Y/N:  Yes  ?  ? 9.? Patient instructed to have a driver: Y/N/NA:  Yes  ?  10.? Patient instructed on NPO status: Y/N/NA:  No dietary restrictions  Confirmed accurate instructions sent to patient: Y/N:  Yes  ?  11.? Specimen needed: Y/N/NA:  Yes   Verified Order placed: Y/N:  Yes  ?  12.? Allergies Verified:? Y/N:? Yes  ?  13.? Is there an Iodine Allergy: Y/N:? No  Does the Procedure Require contrast: Y/N:  No  If so, was the IR- Contrast Allergy Pre-Procedure Medication protocol ordered: Y/NA:  NA  ?  14.? Does the patient have labs according to IR Pre-procedure Laboratory Parameter policy: Y/N/NA:  Yes  If No, was the patient instructed to obtain labs prior to procedure: Y/N/NA:  NA   ?  15.? Will the patient need to be admitted or have a possible admission: Y/N:  No  If yes, confirmed accurate instructions sent to patient: Y/N/NA:  NA   ?  16.? Patient States Understanding:?Y/N:  Yes  ?  17.? History of OSA:? Y/N:  No  If yes, confirm request to bring CPAP sent to patient: Y/N/NA:  NA  ?  18. Patient declines electronic procedure instructions: Y/N:  No - MyChart

## 2020-05-17 NOTE — Patient Education
Dear Kristin Moody,???  ??  Thank you for choosing [facility] Interventional Radiology for your procedure. Your appointment information is listed below:  ?  Appointment Date: 05/27/20      Appointment Time: 12:00pm    Arrival Time:11:00am  Appointment Date: 06/03/20    Appointment Time: 11:00am    Arrival Time:10:00am  Appointment Date: 06/10/20    Appointment Time: 11:00am    Arrival Time:10:00am  Appointment Date: 06/17/20    Appointment Time: 11:00am    Arrival Time:10:00am        Location:   ?  ? Main Campus: [address], [place], [place]? [address]  Parking: [address] Parking Garage  ?  INTERVENTIONAL RADIOLOGY  PRE-PROCEDURE INSTRUCTIONS   ?  You are scheduled for a procedure in Interventional Radiology.? Please follow these instructions and any direction from your Primary Care/Managing Physician.? If you have questions about your procedure or need to reschedule please call [phone].  ?  Medication Instructions:   You may take the following medications with a small sip of water:? Continue your regular medications as directed.   ?  Diet Instructions: No dietary restrictions   Day of Exam Instructions:  1. Bathe or shower with an antibacterial soap prior to your appointment.  2. Bring a list of your current medications and the dosages.  3. Wear comfortable clothing and leave valuables at home.  4. Arrive 1 hour prior to your appointment.? This time will be spent registering, interviewing, assessing, educating and preparing you for the test.  ? You will be with [place] anywhere from 30 minutes to 6 hours after your exam depending on your procedure.  ?  Interventional Radiology Team  Perioperative and Procedural Scheduling Department  [facility]  Ph: [phone]

## 2020-05-18 ENCOUNTER — Encounter: Admit: 2020-05-18 | Discharge: 2020-05-18 | Payer: MEDICARE

## 2020-05-18 ENCOUNTER — Ambulatory Visit: Admit: 2020-05-18 | Discharge: 2020-05-18 | Payer: MEDICARE

## 2020-05-18 DIAGNOSIS — J329 Chronic sinusitis, unspecified: Secondary | ICD-10-CM

## 2020-05-18 DIAGNOSIS — Z01810 Encounter for preprocedural cardiovascular examination: Secondary | ICD-10-CM

## 2020-05-18 DIAGNOSIS — K7460 Unspecified cirrhosis of liver: Secondary | ICD-10-CM

## 2020-05-18 DIAGNOSIS — E669 Obesity, unspecified: Secondary | ICD-10-CM

## 2020-05-18 DIAGNOSIS — I729 Aneurysm of unspecified site: Secondary | ICD-10-CM

## 2020-05-18 DIAGNOSIS — K31819 Angiodysplasia of stomach and duodenum without bleeding: Secondary | ICD-10-CM

## 2020-05-18 DIAGNOSIS — K760 Fatty (change of) liver, not elsewhere classified: Secondary | ICD-10-CM

## 2020-05-18 DIAGNOSIS — E119 Type 2 diabetes mellitus without complications: Secondary | ICD-10-CM

## 2020-05-18 DIAGNOSIS — I10 Essential (primary) hypertension: Secondary | ICD-10-CM

## 2020-05-18 DIAGNOSIS — K5730 Diverticulosis of large intestine without perforation or abscess without bleeding: Secondary | ICD-10-CM

## 2020-05-18 DIAGNOSIS — R0600 Dyspnea, unspecified: Secondary | ICD-10-CM

## 2020-05-18 MED ORDER — LIDOCAINE (PF) 20 MG/ML (2 %) IJ SOLN
INTRAVENOUS | 0 refills | Status: DC
Start: 2020-05-18 — End: 2020-05-18

## 2020-05-18 MED ORDER — LACTATED RINGERS IV SOLP
1000 mL | Freq: Once | INTRAVENOUS | 0 refills | Status: DC
Start: 2020-05-18 — End: 2020-05-18

## 2020-05-18 MED ORDER — LACTATED RINGERS IV SOLP
INTRAVENOUS | 0 refills | Status: DC
Start: 2020-05-18 — End: 2020-05-18

## 2020-05-18 MED ORDER — LACTATED RINGERS IV SOLP
INTRAVENOUS | 0 refills
Start: 2020-05-18 — End: ?

## 2020-05-18 MED ORDER — PROPOFOL 10 MG/ML IV EMUL 20 ML (INFUSION)(AM)(OR)
INTRAVENOUS | 0 refills | Status: DC
Start: 2020-05-18 — End: 2020-05-18

## 2020-05-18 NOTE — Anesthesia Post-Procedure Evaluation
Post-Anesthesia Evaluation    Name: Kristin Moody      MRN: [id number]     DOB: Mar 21, 1956     Age: 64 y.o.     Sex: female   __________________________________________________________________________     Procedure Information     Anesthesia Start Date/Time: 05/18/20 1222    Procedures:       ESOPHAGOGASTRODUODENOSCOPY WITH BIOPSY - FLEXIBLE (N/A )      ESOPHAGOGASTRODUODENOSCOPY WITH CONTROL OF BLEEDING - FLEXIBLE (N/A )    Location: ENDO 5 / ENDO/GI    Surgeons: Lenor Derrick, MD          Post-Anesthesia Vitals  BP: 110/64 (06/30 1320)  Temp: 36.5 ?C (97.7 ?F) (06/30 1254)  Pulse: 100 (06/30 1320)  Respirations: 23 PER MINUTE (06/30 1320)  SpO2: 97 % (06/30 1320)  SpO2 Pulse: 100 (06/30 1320)   Vitals Value Taken Time   BP 110/64 05/18/20 1320   Temp     Pulse 100 05/18/20 1320   Respirations 23 PER MINUTE 05/18/20 1320   SpO2 97 % 05/18/20 1320         Post Anesthesia Evaluation Note    Evaluation location: Pre/Post  Patient participation: recovered; patient participated in evaluation  Level of consciousness: alert    Pain score: 0  Pain management: adequate    Hydration: normovolemia  Temperature: 36.0?C - 38.4?C  Airway patency: adequate    Perioperative Events       Post-op nausea and vomiting: no PONV    Postoperative Status  Cardiovascular status: hemodynamically stable  Respiratory status: spontaneous ventilation  Follow-up needed: none        Perioperative Events  Perioperative Event: No  Emergency Case Activation: No

## 2020-05-19 ENCOUNTER — Encounter: Admit: 2020-05-19 | Discharge: 2020-05-19 | Payer: MEDICARE

## 2020-05-19 ENCOUNTER — Ambulatory Visit: Admit: 2020-05-19 | Discharge: 2020-05-19 | Payer: MEDICARE

## 2020-05-19 DIAGNOSIS — K7460 Unspecified cirrhosis of liver: Secondary | ICD-10-CM

## 2020-05-19 DIAGNOSIS — K31819 Angiodysplasia of stomach and duodenum without bleeding: Secondary | ICD-10-CM

## 2020-05-19 DIAGNOSIS — E119 Type 2 diabetes mellitus without complications: Secondary | ICD-10-CM

## 2020-05-19 DIAGNOSIS — I10 Essential (primary) hypertension: Secondary | ICD-10-CM

## 2020-05-19 DIAGNOSIS — K760 Fatty (change of) liver, not elsewhere classified: Secondary | ICD-10-CM

## 2020-05-19 DIAGNOSIS — R945 Abnormal results of liver function studies: Secondary | ICD-10-CM

## 2020-05-19 DIAGNOSIS — K7469 Other cirrhosis of liver: Secondary | ICD-10-CM

## 2020-05-19 DIAGNOSIS — D649 Anemia, unspecified: Secondary | ICD-10-CM

## 2020-05-19 DIAGNOSIS — Z01810 Encounter for preprocedural cardiovascular examination: Secondary | ICD-10-CM

## 2020-05-19 DIAGNOSIS — E669 Obesity, unspecified: Secondary | ICD-10-CM

## 2020-05-19 DIAGNOSIS — R0600 Dyspnea, unspecified: Secondary | ICD-10-CM

## 2020-05-19 DIAGNOSIS — I729 Aneurysm of unspecified site: Secondary | ICD-10-CM

## 2020-05-19 DIAGNOSIS — K5730 Diverticulosis of large intestine without perforation or abscess without bleeding: Secondary | ICD-10-CM

## 2020-05-19 DIAGNOSIS — J329 Chronic sinusitis, unspecified: Secondary | ICD-10-CM

## 2020-05-19 DIAGNOSIS — D500 Iron deficiency anemia secondary to blood loss (chronic): Secondary | ICD-10-CM

## 2020-05-19 LAB — CBC AND DIFF
Lab: 2.4 M/UL — ABNORMAL LOW (ref 4.0–5.0)
Lab: 27 pg (ref 26–34)
Lab: 6.6 g/dL — ABNORMAL LOW (ref 12.0–15.0)
Lab: 7.1 10*3/uL (ref 4.5–11.0)
Lab: 85 FL (ref 80–100)

## 2020-05-19 LAB — COMPREHENSIVE METABOLIC PANEL
Lab: 1.2 mg/dL — ABNORMAL HIGH (ref 0.4–1.00)
Lab: 1.3 mg/dL — ABNORMAL HIGH (ref 0.3–1.2)
Lab: 102 MMOL/L (ref 98–110)
Lab: 129 U/L — ABNORMAL HIGH (ref 25–110)
Lab: 131 MMOL/L — ABNORMAL LOW (ref 137–147)
Lab: 152 mg/dL — ABNORMAL HIGH (ref 70–100)
Lab: 2.7 g/dL — ABNORMAL LOW (ref 3.5–5.0)
Lab: 20 MMOL/L — ABNORMAL LOW (ref 21–30)
Lab: 22 U/L (ref 7–56)
Lab: 35 mg/dL — ABNORMAL HIGH (ref 7–25)
Lab: 4.4 MMOL/L — ABNORMAL HIGH (ref 3.5–5.1)
Lab: 43 U/L — ABNORMAL HIGH (ref 7–40)
Lab: 43 mL/min — ABNORMAL LOW (ref >60)
Lab: 5.4 g/dL — ABNORMAL LOW (ref 6.0–8.0)
Lab: 52 mL/min — ABNORMAL LOW (ref >60)
Lab: 7.9 mg/dL — ABNORMAL LOW (ref 8.5–10.6)
Lab: 9 (ref 3–12)

## 2020-05-19 LAB — PROTIME INR (PT): Lab: 1.5 % — ABNORMAL HIGH (ref 0.8–1.2)

## 2020-05-19 LAB — IRON + BINDING CAPACITY + %SAT+ FERRITIN
Lab: 15 ug/dL — ABNORMAL LOW (ref 50–160)
Lab: 343 ug/dL (ref 270–380)
Lab: 4 % — ABNORMAL LOW (ref 28–42)
Lab: 58 ng/mL (ref 10–200)

## 2020-05-19 NOTE — Telephone Encounter
call returned. patient advised she needs labs today at [facility].  standing lab orders already in. appt. made at 1240. Patient c/o of intermittent dizziness since x2 days. Will review labs and have clinic staff perform BP check. appt. made, spoke to clinic RN Joey who will ensure patient gets BP check today at lab visit.

## 2020-05-19 NOTE — Telephone Encounter
Pt called in regarding blood work, stated that they took blood from her yesterday and would like to know does she need to still get them done

## 2020-05-19 NOTE — Telephone Encounter
LVM for patient with lab results and update that she will need blood transfusion tomorrow. Hbg 6.6. Will call back with further info about scheduling. Instructed her to go to ED if dizziness persists.   MELD labs reviewed. MELD 19. Pt updated in [facility].

## 2020-05-19 NOTE — Telephone Encounter
Called pt and updated her that blood transfusion is scheduled for 1030. Para rescheduled for 0800 with 0700 check in. Discussed if dizziness worsens she needs to be evaluated in the the ED.

## 2020-05-20 ENCOUNTER — Encounter: Admit: 2020-05-20 | Discharge: 2020-05-20 | Payer: MEDICARE

## 2020-05-20 ENCOUNTER — Ambulatory Visit: Admit: 2020-05-20 | Discharge: 2020-05-20 | Payer: MEDICARE

## 2020-05-20 DIAGNOSIS — K31819 Angiodysplasia of stomach and duodenum without bleeding: Secondary | ICD-10-CM

## 2020-05-20 DIAGNOSIS — K760 Fatty (change of) liver, not elsewhere classified: Secondary | ICD-10-CM

## 2020-05-20 DIAGNOSIS — K7460 Unspecified cirrhosis of liver: Secondary | ICD-10-CM

## 2020-05-20 DIAGNOSIS — K7469 Other cirrhosis of liver: Secondary | ICD-10-CM

## 2020-05-20 DIAGNOSIS — D649 Anemia, unspecified: Secondary | ICD-10-CM

## 2020-05-20 DIAGNOSIS — D500 Iron deficiency anemia secondary to blood loss (chronic): Secondary | ICD-10-CM

## 2020-05-20 LAB — GRAM STAIN

## 2020-05-20 MED ORDER — ALBUMIN, HUMAN 25 % IV SOLP
0 refills | Status: CP
Start: 2020-05-20 — End: ?

## 2020-05-20 NOTE — Other
Immediate Post Procedure Note    Date:  05/20/2020                                         Attending Physician:   Carlis Stable MD  Performing Provider: Thayer Dallas DO, Arlyce Harman, MD    Consent:  Consent obtained from patient.  Time out performed: Consent obtained, correct patient verified, correct procedure verified, correct site verified, patient marked as necessary.  Pre/Post Procedure Diagnosis:  ascites  Indications:  As above      Procedure(s):  paracentesis  Findings:  Straw colored fluid     Estimated Blood Loss:  None/Negligible  Specimen(s) Removed/Disposition:  Yes, sent to pathology  Complications: None  Patient Tolerated Procedure: Well  Post-Procedure Condition:  stable    Arlyce Harman, MD  Pager [phone]

## 2020-05-20 NOTE — H&P (View-Only)
IR Pre-Procedure History and Physical/Sedation Plan    Procedure Date: 05/20/2020     Planned Procedure(s):  Ultrasound-guided paracentesis     Indication:  Fluid testing; Therapeutic drainage  __________________________________________________________________    Chief Complaint:  Ascites    History of Present Illness: Kristin Moody is a 64 y.o. female with a history as listed below who presents today for procedure.    Patient Active Problem List    Diagnosis Date Noted   ? 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 Hypotension 02/10/2020   ? 'Light-for-dates' infant with signs of fetal malnutrition 01/22/2020   ? 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 Anemia 09/28/2019   ? Encephalopathy 08/12/2019   ? Spasticity 08/10/2019   ? Hyperreflexia 08/10/2019   ? 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 Right leg weakness 05/14/2019   ? 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 Iron (Fe) deficiency anemia 01/19/2019   ? Preop cardiovascular exam 01/07/2019   ? Pre-transplant evaluation for liver transplant 01/06/2019   ? Cirrhosis (HCC) 12/16/2018   ? Acute on chronic anemia 12/16/2018   ? Lactic acidosis 12/16/2018   ? GI bleed 10/24/2018   ? Portal hypertension (HCC) 08/28/2018   ? Confusion 08/27/2018   ? Dyspnea 08/27/2018   ? Chronic abdominal pain 08/27/2018   ? 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 Acute on chronic blood loss anemia 08/27/2018   ? Iron deficiency anemia 04/13/2018   ? Pneumonia due to infectious organism 04/13/2018   ? Melena 04/10/2018   ? Esophageal varices without bleeding (HCC) 02/25/2018   ? Cirrhosis of liver without ascites (HCC) 02/25/2018   ? 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 Lower abdominal pain 10/24/2014   ? Chest discomfort 09/21/2014   ? Essential hypertension 08/13/2012   ? Abnormal liver function tests 05/13/2011   ? Fatty liver disease, nonalcoholic 05/13/2011   ? Obesity (BMI 30-39.9) 05/13/2011   ? Slow transit constipation 05/13/2011     Medical History:   Diagnosis Date   ? Aneurysm (HCC)    ? 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 Diverticulosis of colon     descending and sigmoid colon   ? Dyspnea    ? Essential hypertension 08/13/2012   ? Fatty infiltration of liver    ? HTN (hypertension)    ? Obesity (BMI 30-39.9) 05/13/2011   ? Preop cardiovascular exam 01/07/2019   ? Sinus infection    ? Type 2 diabetes mellitus (HCC) 09/21/2014      Surgical History:   Procedure Laterality Date   ? HYSTERECTOMY  1994   ? UPPER GASTROINTESTINAL ENDOSCOPY  2009   ? COLONOSCOPY  2009   ? CYSTOCELE REPAIR  2009    with endocele repair   ? RECTOCELE REPAIR  03/2009   ? LIVER BIOPSY  07/17/2010   ? COLONOSCOPY N/A 11/08/2017    Performed by Dawna Part, MD at [facility] ENDO   ? ESOPHAGOGASTRODUODENOSCOPY N/A 11/08/2017    Performed by Dawna Part, MD at [facility] ENDO   ? ESOPHAGOGASTRODUODENOSCOPY WITH BIOPSY - FLEXIBLE  11/08/2017    Performed by Dawna Part, MD at [facility] ENDO   ? COLONOSCOPY WITH HOT BIOPSY FORCEPS REMOVAL TUMOR/ POLYP/ OTHER LESION  11/08/2017    Performed by Dawna Part, MD at [facility] ENDO   ? ESOPHAGOGASTRODUODENOSCOPY WITH BAND LIGATION ESOPHAGEAL/ GASTRIC  VARICES - FLEXIBLE N/A 04/10/2018    Performed by Normajean Baxter, MD at [facility] ENDO   ? ESOPHAGOGASTRODUODENOSCOPY WITH BIOPSY - FLEXIBLE N/A 04/10/2018    Performed by Normajean Baxter, MD at [facility] ENDO   ? ESOPHAGOGASTRODUODENOSCOPY WITH CONTROL OF BLEEDING - FLEXIBLE N/A 04/25/2018    Performed by Jolee Ewing, MD at [facility] ENDO   ? ESOPHAGOGASTRODUODENOSCOPY WITH BIOPSY - FLEXIBLE with push enteroscopy N/A 08/28/2018    Performed by Celesta Gentile, MD at [facility] ENDO   ? EGD N/A 10/27/2018    Performed by Eliott Nine, MD at [facility] ENDO   ? ESOPHAGOGASTRODUODENOSCOPY WITH SNARE REMOVAL TUMOR/ POLYP/ OTHER LESION - FLEXIBLE N/A 10/27/2018    Performed by Eliott Nine, MD at [facility] ENDO   ? ESOPHAGOGASTRODUODENOSCOPY WITH BIOPSY - FLEXIBLE N/A 12/16/2018    Performed by Buckles, Vinnie Level, MD at [facility] ENDO   ? ESOPHAGOGASTRODUODENOSCOPY WITH CONTROL OF BLEEDING - FLEXIBLE N/A 12/16/2018    Performed by Buckles, Vinnie Level, MD at [facility] ENDO   ? ESOPHAGOGASTRODUODENOSCOPY WITH SPECIMEN COLLECTION BY BRUSHING/ WASHING N/A 01/22/2019    Performed by Veneta Penton, MD at [facility] ENDO   ? ESOPHAGOGASTRODUODENOSCOPY WITH DILATION ESOPHAGUS WITH BALLOON 30 MM OR GREATER - FLEXIBLE N/A 01/22/2019    Performed by Veneta Penton, MD at [facility] ENDO   ? ESOPHAGOGASTRODUODENOSCOPY WITH BIOPSY - FLEXIBLE N/A 01/22/2019    Performed by Veneta Penton, MD at [facility] ENDO   ? ESOPHAGOGASTRODUODENOSCOPY WITH BIOPSY - FLEXIBLE N/A 06/19/2019    Performed by Dawna Part, MD at [facility] ENDO   ? ESOPHAGOGASTRODUODENOSCOPY [WITH APC]  WITH SPECIMEN COLLECTION BY BRUSHING/ WASHING N/A 08/07/2019    Performed by Dawna Part, MD at [facility] ENDO   ? ESOPHAGOGASTRODUODENOSCOPY WITH SPECIMEN COLLECTION BY BRUSHING/ WASHING N/A 09/10/2019    Performed by Buckles, Vinnie Level, MD at [facility] ENDO   ? ESOPHAGOGASTRODUODENOSCOPY WITH CONTROL OF BLEEDING - FLEXIBLE N/A 09/10/2019    Performed by Buckles, Vinnie Level, MD at [facility] ENDO   ? ESOPHAGOGASTRODUODENOSCOPY WITH CONTROL OF BLEEDING - FLEXIBLE N/A 02/12/2020    Performed by Lenor Derrick, MD at [facility] ENDO   ? ESOPHAGOGASTRODUODENOSCOPY WITH BIOPSY - FLEXIBLE N/A 02/26/2020    Performed by Buckles, Vinnie Level, MD at [facility] ENDO   ? ESOPHAGOGASTRODUODENOSCOPY WITH BIOPSY - FLEXIBLE N/A 05/18/2020    Performed by Lenor Derrick, MD at [facility] ENDO   ? ESOPHAGOGASTRODUODENOSCOPY WITH CONTROL OF BLEEDING - FLEXIBLE N/A 05/18/2020    Performed by Lenor Derrick, MD at [facility] ENDO   ? CHOLECYSTECTOMY     ? COLONOSCOPY     ? ESOPHAGOGASTRIC FUNDOPLICATION  2003, 2004    laparoscopic   ? ESOPHAGOGASTRIC FUNDOPLICATION     ? HX HYSTERECTOMY     ? PR ESOPHAGOSCOPY FLEXIBLE TRANSORAL DIAGNOSTIC        Social History     Tobacco Use   ? Smoking status: Never Smoker   ? Smokeless tobacco: Never Used Substance Use Topics   ? Alcohol use: No      Family History   Problem Relation Age of Onset   ? Other Mother    ? Kidney Failure Mother    ? Osteoporosis Mother    ? Cancer Father         esophageal   ? Liver Disease Sister         endstage, s/p liver transplant   ? Cancer Brother         tonsil, liver    ?  Hip Fracture Neg Hx       Medications Prior to Admission   Medication Sig Dispense Refill Last Dose   ? cetirizine (ZYRTEC) 10 mg tablet Take 10 mg by mouth daily as needed for Allergy symptoms.      ? ciprofloxacin (CIPRO) 500 mg tablet Take one tablet by mouth daily. 30 tablet 1    ? duloxetine DR (CYMBALTA) 60 mg capsule Take 60 mg by mouth daily.      ? midodrine (PROAMITINE) 10 mg tablet Take one tablet by mouth three times daily. 90 tablet 1    ? pantoprazole DR (PROTONIX) 40 mg tablet TAKE 1 TABLET BY MOUTH TWICE A DAY 180 tablet 1    ? polyethylene glycol 3350 (MIRALAX) 17 g packet Take one packet by mouth twice daily.      ? promethazine (PHENERGAN) 25 mg tablet Take 25 mg by mouth every 4 hours as needed. Just uses it with Tramadol      ? rifAXIMin (XIFAXAN) 550 mg tablet Take one tablet by mouth twice daily. 180 tablet 3    ? zinc sulfate 220 mg (50 mg elemental zinc) capsule Take one capsule by mouth daily. 90 capsule 3      Allergies   Allergen Reactions   ? Tegaderm BLISTERS   ? Adhesive Tape (Rosins) RASH     Other reaction(s): irritated skin   ? Sulfa (Sulfonamide Antibiotics) RASH     Other reaction(s): Unknown Reaction   ? Codeine NAUSEA AND VOMITING   ? Morphine SEE COMMENTS     Pt reports burns her veins  Other reaction(s): Unknown Reaction   ? Penicillin G SEE COMMENTS     Throat issues, blisters in throat    ? Tramadol NAUSEA ONLY and ITCHING     Takes this medication regularly        Review of Systems  Constitutional: negative for fevers  Respiratory: positive for dyspnea on exertion  Cardiovascular: negative for chest pain  Gastrointestinal: positive for nausea and abdominal pain Physical Exam:  Vital Signs: Last Filed In 24 Hours Vital Signs: 24 Hour Range   BP: 108/51 (07/01 1306)  Pulse: 100 (07/02 0740)  Respirations: 21 PER MINUTE (07/02 0740)  SpO2: 99 % (07/02 0740)  SpO2 Pulse: 101 (07/02 0740) BP: (108)/(51)   Pulse:  [100]   Respirations:  [21 PER MINUTE]   SpO2:  [99 %]           General appearance: alert and no distress noted.  Neurologic: Grossly normal.  Lungs: Non labored.  Heart: regular rate and rhythm  Abdomen: distended    Pre-procedure anxiolysis plan: N/A  Sedation/Medication Plan: Local anesthetic  Personal history of sedation complications: Denies adverse event.   Family history of sedation complications: Denies adverse event.   Medications for Reversal: NA  Discussion/Reviews:  Physician has discussed risks and alternatives of this type of sedation and above planned procedures with patient    NPO Status: NA  Airway:  NA  Head and Neck: NA  Mouth: NA   Anesthesia Classification:  ASA III (A patient with a severe systemic disease that limits activity, but is not incapacitating)  Pregnancy Status: N/A    Lab/Radiology/Other Diagnostic Tests:  Labs:  Pertinent labs reviewed           Samson Frederic, APRN-NP  Pager [phone]

## 2020-05-20 NOTE — Patient Instructions
PARACENTESIS   A paracentesis is the removal of an abnormal buildup of fluid in your abdominal cavity. This fluid buildup is called ascites and may be caused by conditions such as liver disease, heart failure, or cancer. During this procedure, a needle is inserted into your abdomen to drain the fluid. The fluid may then be sent to the lab for testing if medically indicated. Removal of the fluid may also relieve belly pressure and shortness of breath caused by the ascites.?  POST-PROCEDURE PAIN:   ? Pain control following your procedure is a priority for both you and your Physicians.  ? Some soreness or tenderness at the site is to be expected for several days. We recommend taking over the counter analgesics to help relieve this pain.  ? Alternative methods for pain relief include but not limited to heat or cold compress, relaxation techniques, rest, and changing of positions.  ? If pain continues after 5-7 days or you have severe pain not relieved by medication, please contact us as directed below.?  POST-PROCEDURE ACTIVITY:   ? A responsible adult must drive you home.  ? If you receive sedation, narcotic pain medication or anesthesia for the procedure, you should not drive or operate heavy machinery or do anything that requires concentration for at least 24 hours after procedure completion.  ? It is recommended that a responsible adult be with you until morning.?  POST-PROCEDURE SITE CARE:   ? You will have a small bandage over the procedure site. Keep this dry.  ? You may remove it in 24 hours.  ? You may shower in 24 hours, after removing the bandage.  ? A dry gauze bandage may be reapplied as necessary to protect your clothing as the site may sometimes leak for several days after the procedure.  ? Do not submerge the procedure site for 1 week (no bathtub, swimming, hot tub, etc.)  ? Do not use ointments, creams or powders on the puncture site.  ? Be sure your hands are clean when touching near the site.? DIET/MEDICATIONS:   ? You may resume your previous diet after the procedure.  ? If you receive sedation or narcotic pain medications, avoid any foods or beverages containing alcohol for at least 24 hours after the procedure.  ? Please see the Medication Reconciliation sheet for instructions regarding resuming your home medications.?  CALL THE DOCTOR IF:   ? Bright red blood soaks the bandage.  ? You have pain not relieved by medication. Some soreness at the site is to be expected.  ? You have signs of infection such as: fever greater than 101F, chills, redness, warmth, swelling, drainage or pus from the puncture site.  For any of the above symptoms or for problems or concerns related to the procedure performed at the [place] Location, call [phone] Monday-Friday from 7-5p. After-hours and weekends, please call [phone] and ask for the Interventional Radiology Resident on-call.   You or your caregiver should call 911 for any severe symptoms such as excessive bleeding, severe dizziness, trouble breathing or loss of consciousness.   ?  ?

## 2020-05-20 NOTE — Patient Instructions
[facility]  OUTPATIENT POST TRANSFUSION INSTRUCTIONS     During your transfusion you were monitored by nursing staff for signs of a transfusion reaction, however, you should continue to observe for the following symptoms after you have been dismissed from the clinic:     FEVER OF 100.5 OR HIGHER  CHILLS  NAUSEA OR VOMITING  FEELING FAINT OR DIZZY  SHORTNESS OF BREATH  DARK OR RED COLORED URINE  CHEST OR BACK PAIN  SHOCK OR LOSS OF CONSCIOUSNESS  YELLOWING OF THE EYES OR SKIN     If you or your caregiver notice any of these symptoms, contact your ordering physician immediately and go to the nearest Emergency Department for treatment. When you arrive at the Emergency Department notify them that you recently received a blood transfusion. *For more detailed signs and symptoms of a transfusion reaction, please refer to the [place] education information below.          When You Need a Blood Transfusion (Adult)  A blood transfusion may be done when you have lost blood because of an injury or during surgery. It can also be done because of diseases or conditions that affect the blood. Blood is made up of several different parts (blood products). You may receive some or all of these blood products during a transfusion. Blood for transfusion is usually donated from another person (donor). Strict measures are taken to make sure that donated blood is safe before it's given to you. This sheet helps you understand how a blood transfusion is done. Your healthcare provider will discuss your condition with you and answer your questions.   The parts of blood  Blood can be broken down into different parts that perform special roles in the body. These parts include:  ? Red blood cells, which carry oxygen throughout the body.  ? Platelets, which help stop bleeding.  ? Plasma (the liquid part of blood), which carries red blood cells and platelets throughout the body. Plasma also helps platelets in stopping bleeding. Where does donated blood come from?  ? Volunteer donors. These are people who donate their blood to help others in need of blood. Blood donation can take place at several places, including a hospital, blood bank, or during a blood drive.  ? Directed donation. If you need a blood transfusion during a planned surgery, family and friends can have their blood tested for compatibility and donate blood for you before the surgery. This needs to be done at least 7 day(s) in advance. This is because the blood must be tested for safety.  ? Autologous donation. This is also called self-donation. For planned surgery, you can donate your own blood starting up to 6 weeks before surgery.  Are blood transfusions safe?  Donated blood is tested and processed to make sure that the blood is safe:  ? The health and medical history of each donor is carefully screened. If a person is considered high-risk for infection or problems, he or she isn't accepted as a blood donor.  ? Donated blood is tested for infections such as hepatitis, syphilis, and HIV (the virus that causes AIDS). If the tested blood is found to be unsafe, it's destroyed.  ? Blood is divided into four types: A, B, AB, and O. Blood also has Rh types: positive (+) and negative (-). You can only receive blood products that are compatible with (match) your blood type. A sample of your blood is tested for compatibility with donated blood. This is  done before blood products are prepared for a transfusion.  How is a blood transfusion done?  A blood transfusion takes place in a blood center, infusion center, hospital room, or operating room. Your healthcare provider will discuss the blood transfusion with you before it's done. You'll need to give permission for the blood transfusion by signing a consent form.  ? Two healthcare providers confirm your identity. They also confirm that they have the correct blood product(s) for you.  ? An intravenous (IV) line is placed in a vein if you do not already have an IV.  ? The blood product comes in a plastic bag that is hung on an IV pole. The blood product flows from the bag into your IV line. The IV line may be connected to a pump, which controls the transfusion rate. You may receive more than one kind of blood product through the IV.  ? Your vital signs (blood pressure, heart rate, respiratory rate, and temperature) are checked throughout the transfusion. This is to make sure you are not having a reaction to the blood product.  ? The IV line may be removed once the transfusion is complete.  Possible risks and complications of blood transfusions  Most transfusions are problem free. In some cases, reactions occur. These can happen within seconds to minutes during the transfusion or a week to a few months after the transfusion. Call your doctor or nurse right away if you have any of the signs or symptoms in the table below during or after a transfusion:  Reaction Timing Symptoms   Allergic reaction (mild) ? Within seconds to minutes during the transfusion  ? Up to 24 hours after the transfusion Hives or red welts on the skin, mild itching, rash, localized swelling, flushing (red face), wheezing, shortness of breath, or stridor (high-pitched noise or sound)   Anaphylactic reaction ? Within seconds to minutes during the transfusion  ? Up to 24 hours after the transfusion Shortness of breath, flushing (red face), wheezing, labored (working hard) breathing, low blood pressure, localized swelling, chest tightness, or cramps   Febrile nonhemolytic reaction ? Within minutes to hours during the transfusion  ? Within a few hours to 24 hours after the transfusion Fever (increase of 1? C or higher), chills, flushing (red face), nausea, headache, minor discomfort, or mild shortness of breath   Acute immune hemolytic reaction ? Within minutes during the transfusion  ? Up to 24 hours after the transfusion Fever, red or brown urine, back pain, fast heart rate (tachycardia), abdominal pain, low blood pressure, feeling anxious, chills, chest pain, nausea, or fainting spells   Transfusion-related acute lung injury (TRALI) ? Within 1 to 2 hours during the transfusion  ? Up to 6 hours after the transfusion Shortness of breath, trouble breathing, low blood pressure, fever, pulmonary edema   Transfusion-associated circulatory overload ? Near the end of the transfusion  ? Within 6 hours after the transfusion Shortness of breath, fast heart rate (tachycardia), problems breathing when lying on back, abnormal blood pressure   Post-transfusion purpura (PUP) ? Within 1 week  ? Up to 48 days after the transfusion Purple spots on skin; nose bleed; bleeding from the urinary tract, abdomen, colon, or rectum; fever; or chills     Delayed transfusion-related acute lung injury (TRALI) ? Within 72 hours (3 days) after the transfusion Sudden onset of respiratory distress or trouble breathing   Delayed hemolytic reaction ? Within 3 to 7 days  ? Up to weeks after the  transfusion Low-grade fever, mild jaundice (yellowing of the skin and whites of the eyes), decrease in hematocrit, chills, chest pain, back pain, nausea   ? 2000-2019 [employer], [place]. [address] [place], [place] [address]. All rights reserved. This information is not intended as a substitute for professional medical care. Always follow your healthcare professional's instructions.

## 2020-05-21 ENCOUNTER — Ambulatory Visit: Admit: 2020-05-21 | Discharge: 2020-05-21 | Payer: MEDICARE

## 2020-05-21 DIAGNOSIS — K31819 Angiodysplasia of stomach and duodenum without bleeding: Secondary | ICD-10-CM

## 2020-05-21 DIAGNOSIS — K7460 Unspecified cirrhosis of liver: Secondary | ICD-10-CM

## 2020-05-24 ENCOUNTER — Encounter: Admit: 2020-05-24 | Discharge: 2020-05-24 | Payer: MEDICARE

## 2020-05-24 ENCOUNTER — Ambulatory Visit: Admit: 2020-05-24 | Discharge: 2020-05-24 | Payer: MEDICARE

## 2020-05-24 DIAGNOSIS — K760 Fatty (change of) liver, not elsewhere classified: Secondary | ICD-10-CM

## 2020-05-24 DIAGNOSIS — D649 Anemia, unspecified: Secondary | ICD-10-CM

## 2020-05-24 DIAGNOSIS — R945 Abnormal results of liver function studies: Secondary | ICD-10-CM

## 2020-05-24 DIAGNOSIS — D500 Iron deficiency anemia secondary to blood loss (chronic): Secondary | ICD-10-CM

## 2020-05-24 DIAGNOSIS — K31819 Angiodysplasia of stomach and duodenum without bleeding: Secondary | ICD-10-CM

## 2020-05-24 DIAGNOSIS — K7469 Other cirrhosis of liver: Secondary | ICD-10-CM

## 2020-05-24 LAB — CBC AND DIFF
Lab: 2.8 M/UL — ABNORMAL LOW (ref 4.0–5.0)
Lab: 23 % — ABNORMAL LOW (ref 36–45)
Lab: 7.2 10*3/uL (ref 4.5–11.0)

## 2020-05-24 LAB — COMPREHENSIVE METABOLIC PANEL
Lab: 1.2 mg/dL — ABNORMAL HIGH (ref 0.4–1.00)
Lab: 1.5 mg/dL — ABNORMAL HIGH (ref 0.3–1.2)
Lab: 102 MMOL/L (ref 98–110)
Lab: 131 MMOL/L — ABNORMAL LOW (ref 137–147)
Lab: 140 U/L — ABNORMAL HIGH (ref 25–110)
Lab: 171 mg/dL — ABNORMAL HIGH (ref 70–100)
Lab: 2.9 g/dL — ABNORMAL LOW (ref 3.5–5.0)
Lab: 21 U/L (ref 7–56)
Lab: 22 MMOL/L — ABNORMAL LOW (ref 21–30)
Lab: 30 mg/dL — ABNORMAL HIGH (ref 7–25)
Lab: 4.6 MMOL/L (ref 3.5–5.1)
Lab: 43 U/L — ABNORMAL HIGH (ref 7–40)
Lab: 43 mL/min — ABNORMAL LOW (ref >60)
Lab: 5.5 g/dL — ABNORMAL LOW (ref 6.0–8.0)
Lab: 53 mL/min — ABNORMAL LOW (ref >60)
Lab: 7 10*3/uL (ref 3–12)
Lab: 8.4 mg/dL — ABNORMAL LOW (ref 8.5–10.6)

## 2020-05-24 LAB — PROTIME INR (PT): Lab: 1.4 g/dL — ABNORMAL HIGH (ref 0.8–1.2)

## 2020-05-24 MED ORDER — MIDODRINE 10 MG PO TAB
10 mg | ORAL_TABLET | Freq: Three times a day (TID) | ORAL | 1 refills | Status: DC
Start: 2020-05-24 — End: 2020-06-16

## 2020-05-24 NOTE — Telephone Encounter
patient called asking for refill of midodrine. Rx filled.

## 2020-05-27 ENCOUNTER — Ambulatory Visit: Admit: 2020-05-27 | Discharge: 2020-05-28 | Payer: MEDICARE

## 2020-05-27 ENCOUNTER — Encounter: Admit: 2020-05-27 | Discharge: 2020-05-27 | Payer: MEDICARE

## 2020-05-27 ENCOUNTER — Ambulatory Visit: Admit: 2020-05-27 | Discharge: 2020-05-27 | Payer: MEDICARE

## 2020-05-27 DIAGNOSIS — E669 Obesity, unspecified: Secondary | ICD-10-CM

## 2020-05-27 DIAGNOSIS — K760 Fatty (change of) liver, not elsewhere classified: Secondary | ICD-10-CM

## 2020-05-27 DIAGNOSIS — K31819 Angiodysplasia of stomach and duodenum without bleeding: Secondary | ICD-10-CM

## 2020-05-27 DIAGNOSIS — E119 Type 2 diabetes mellitus without complications: Secondary | ICD-10-CM

## 2020-05-27 DIAGNOSIS — D649 Anemia, unspecified: Secondary | ICD-10-CM

## 2020-05-27 DIAGNOSIS — Z01810 Encounter for preprocedural cardiovascular examination: Secondary | ICD-10-CM

## 2020-05-27 DIAGNOSIS — D500 Iron deficiency anemia secondary to blood loss (chronic): Secondary | ICD-10-CM

## 2020-05-27 DIAGNOSIS — I10 Essential (primary) hypertension: Secondary | ICD-10-CM

## 2020-05-27 DIAGNOSIS — K5730 Diverticulosis of large intestine without perforation or abscess without bleeding: Secondary | ICD-10-CM

## 2020-05-27 DIAGNOSIS — R0600 Dyspnea, unspecified: Secondary | ICD-10-CM

## 2020-05-27 DIAGNOSIS — I729 Aneurysm of unspecified site: Secondary | ICD-10-CM

## 2020-05-27 DIAGNOSIS — Z713 Dietary counseling and surveillance: Secondary | ICD-10-CM

## 2020-05-27 DIAGNOSIS — R188 Other ascites: Secondary | ICD-10-CM

## 2020-05-27 DIAGNOSIS — K7460 Unspecified cirrhosis of liver: Secondary | ICD-10-CM

## 2020-05-27 DIAGNOSIS — J329 Chronic sinusitis, unspecified: Secondary | ICD-10-CM

## 2020-05-27 DIAGNOSIS — R945 Abnormal results of liver function studies: Secondary | ICD-10-CM

## 2020-05-27 DIAGNOSIS — K7469 Other cirrhosis of liver: Secondary | ICD-10-CM

## 2020-05-27 DIAGNOSIS — K7290 Hepatic failure, unspecified without coma: Secondary | ICD-10-CM

## 2020-05-27 DIAGNOSIS — K7030 Alcoholic cirrhosis of liver without ascites: Secondary | ICD-10-CM

## 2020-05-27 LAB — COMPREHENSIVE METABOLIC PANEL
Lab: 1.2 mg/dL (ref 0.3–1.2)
Lab: 1.4 mg/dL — ABNORMAL HIGH (ref 0.4–1.00)
Lab: 103 MMOL/L (ref 98–110)
Lab: 109 mg/dL — ABNORMAL HIGH (ref 70–100)
Lab: 132 MMOL/L — ABNORMAL LOW (ref 137–147)
Lab: 146 U/L — ABNORMAL HIGH (ref 25–110)
Lab: 2.8 g/dL — ABNORMAL LOW (ref 3.5–5.0)
Lab: 21 U/L (ref 7–56)
Lab: 23 MMOL/L (ref 21–30)
Lab: 37 mL/min — ABNORMAL LOW (ref >60)
Lab: 38 mg/dL — ABNORMAL HIGH (ref 7–25)
Lab: 4.5 MMOL/L — ABNORMAL HIGH (ref 3.5–5.1)
Lab: 41 U/L — ABNORMAL HIGH (ref 7–40)
Lab: 45 mL/min — ABNORMAL LOW (ref >60)
Lab: 5.5 g/dL — ABNORMAL LOW (ref 6.0–8.0)
Lab: 8.2 mg/dL — ABNORMAL LOW (ref 8.5–10.6)
Lab: 6 (ref 3–12)

## 2020-05-27 LAB — PROTIME INR (PT): Lab: 1.5 g/dL — ABNORMAL HIGH (ref 0.8–1.2)

## 2020-05-27 LAB — CBC AND DIFF
Lab: 2.5 M/UL — ABNORMAL LOW (ref 4.0–5.0)
Lab: 20 % — ABNORMAL LOW (ref 36–45)
Lab: 25 pg — ABNORMAL LOW (ref 26–34)
Lab: 81 FL (ref 80–100)
Lab: 9 10*3/uL (ref 4.5–11.0)

## 2020-05-27 MED ORDER — ALBUMIN, HUMAN 25 % IV SOLP
0 refills | Status: CP
Start: 2020-05-27 — End: ?

## 2020-05-27 MED ORDER — MIDODRINE 5 MG PO TAB
10 mg | Freq: Once | ORAL | 0 refills | Status: CP
Start: 2020-05-27 — End: ?

## 2020-05-27 NOTE — Other
Immediate Post Procedure Note    Date: 05/27/2020                                    Attending Physician:   Thomasena Edis  Performing Provider:  Nicholes Stairs, MD    Consent:  Consent obtained from patient.  Time out performed: Consent obtained, correct patient verified, correct procedure verified, correct site verified, patient marked as necessary.  Pre/Post Procedure Diagnosis:  Ascites  Indications:  Ascites    Anesthesia: Local  Procedure(s):  US guided paracentesis  Findings:  Successful Ultrasound guided paracentesis      Estimated Blood Loss:  None/Negligible  Specimen(s) Removed/Disposition:  Yes, sent to pathology  Complications: None  Patient Tolerated Procedure: Well  Post-Procedure Condition:  stable    Nicholes Stairs, MD

## 2020-05-27 NOTE — Progress Notes
IR called, hgb drawn 6.7. Called HF infusion for 1 unit PRBCs, patient to go to infusion post IR treatment. Both Infusion and IR teams notified of this information. blood transfusion orders placed.

## 2020-05-27 NOTE — Progress Notes
Patient approximately 1.5 hours in to her blood transfusion and her BP is now 94/49. Her BP prior to transfusion was 106/50. Patient asymptomatic, and no concern for transfusion reaction at this time. However, patient reports she her BP is probably lower because she missed her noon dose of midodrine and does not have it with her to take. Notified Coralee North, APRN. Per Shanda Bumps, will order and give midodrine 10 mg now to replace missed home dose.

## 2020-05-27 NOTE — H&P (View-Only)
IR Pre-Procedure History and Physical/Sedation Plan    Procedure Date: 05/27/2020     Planned Procedure(s):  Ultrasound-guided paracentesis     Indication:  Diagnostic/therapeutic; hx of cirrhosis with refractory ascites   __________________________________________________________________    Chief Complaint:  Ascites    History of Present Illness: Kristin Moody is a 64 y.o. female with a history as listed below who presents today for procedure. She reports abdominal distention and is agreeable to procedure.     Patient Active Problem List    Diagnosis Date Noted   ? 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 Hypotension 02/10/2020   ? 'Light-for-dates' infant with signs of fetal malnutrition 01/22/2020   ? 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 Anemia 09/28/2019   ? Encephalopathy 08/12/2019   ? Spasticity 08/10/2019   ? Hyperreflexia 08/10/2019   ? 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 Right leg weakness 05/14/2019   ? 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 Iron (Fe) deficiency anemia 01/19/2019   ? Preop cardiovascular exam 01/07/2019   ? Pre-transplant evaluation for liver transplant 01/06/2019   ? Cirrhosis (HCC) 12/16/2018   ? Acute on chronic anemia 12/16/2018   ? Lactic acidosis 12/16/2018   ? GI bleed 10/24/2018   ? Portal hypertension (HCC) 08/28/2018   ? Confusion 08/27/2018   ? Dyspnea 08/27/2018   ? Chronic abdominal pain 08/27/2018   ? 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 Acute on chronic blood loss anemia 08/27/2018   ? Iron deficiency anemia 04/13/2018   ? Pneumonia due to infectious organism 04/13/2018   ? Melena 04/10/2018   ? Esophageal varices without bleeding (HCC) 02/25/2018   ? Cirrhosis of liver without ascites (HCC) 02/25/2018   ? 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 Lower abdominal pain 10/24/2014   ? Chest discomfort 09/21/2014   ? Essential hypertension 08/13/2012   ? Abnormal liver function tests 05/13/2011   ? Fatty liver disease, nonalcoholic 05/13/2011   ? Obesity (BMI 30-39.9) 05/13/2011   ? Slow transit constipation 05/13/2011     Medical History:   Diagnosis Date   ? Aneurysm (HCC)    ? 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 Diverticulosis of colon     descending and sigmoid colon   ? Dyspnea    ? Essential hypertension 08/13/2012   ? Fatty infiltration of liver    ? HTN (hypertension)    ? Obesity (BMI 30-39.9) 05/13/2011   ? Preop cardiovascular exam 01/07/2019   ? Sinus infection    ? Type 2 diabetes mellitus (HCC) 09/21/2014      Surgical History:   Procedure Laterality Date   ? HYSTERECTOMY  1994   ? UPPER GASTROINTESTINAL ENDOSCOPY  2009   ? COLONOSCOPY  2009   ? CYSTOCELE REPAIR  2009    with endocele repair   ? RECTOCELE REPAIR  03/2009   ? LIVER BIOPSY  07/17/2010   ? COLONOSCOPY N/A 11/08/2017    Performed by Dawna Part, MD at [facility] ENDO   ? ESOPHAGOGASTRODUODENOSCOPY N/A 11/08/2017    Performed by Dawna Part, MD at [facility] ENDO   ? ESOPHAGOGASTRODUODENOSCOPY WITH BIOPSY - FLEXIBLE  11/08/2017    Performed by Dawna Part, MD at [facility] ENDO   ? COLONOSCOPY WITH HOT BIOPSY FORCEPS REMOVAL TUMOR/ POLYP/ OTHER LESION  11/08/2017    Performed by Earle Gell  M, MD at [facility] ENDO   ? ESOPHAGOGASTRODUODENOSCOPY WITH BAND LIGATION ESOPHAGEAL/ GASTRIC VARICES - FLEXIBLE N/A 04/10/2018    Performed by Normajean Baxter, MD at [facility] ENDO   ? ESOPHAGOGASTRODUODENOSCOPY WITH BIOPSY - FLEXIBLE N/A 04/10/2018    Performed by Normajean Baxter, MD at [facility] ENDO   ? ESOPHAGOGASTRODUODENOSCOPY WITH CONTROL OF BLEEDING - FLEXIBLE N/A 04/25/2018    Performed by Jolee Ewing, MD at [facility] ENDO   ? ESOPHAGOGASTRODUODENOSCOPY WITH BIOPSY - FLEXIBLE with push enteroscopy N/A 08/28/2018    Performed by Celesta Gentile, MD at [facility] ENDO   ? EGD N/A 10/27/2018    Performed by Eliott Nine, MD at [facility] ENDO   ? ESOPHAGOGASTRODUODENOSCOPY WITH SNARE REMOVAL TUMOR/ POLYP/ OTHER LESION - FLEXIBLE N/A 10/27/2018 Performed by Eliott Nine, MD at [facility] ENDO   ? ESOPHAGOGASTRODUODENOSCOPY WITH BIOPSY - FLEXIBLE N/A 12/16/2018    Performed by Buckles, Vinnie Level, MD at [facility] ENDO   ? ESOPHAGOGASTRODUODENOSCOPY WITH CONTROL OF BLEEDING - FLEXIBLE N/A 12/16/2018    Performed by Buckles, Vinnie Level, MD at [facility] ENDO   ? ESOPHAGOGASTRODUODENOSCOPY WITH SPECIMEN COLLECTION BY BRUSHING/ WASHING N/A 01/22/2019    Performed by Veneta Penton, MD at [facility] ENDO   ? ESOPHAGOGASTRODUODENOSCOPY WITH DILATION ESOPHAGUS WITH BALLOON 30 MM OR GREATER - FLEXIBLE N/A 01/22/2019    Performed by Veneta Penton, MD at [facility] ENDO   ? ESOPHAGOGASTRODUODENOSCOPY WITH BIOPSY - FLEXIBLE N/A 01/22/2019    Performed by Veneta Penton, MD at [facility] ENDO   ? ESOPHAGOGASTRODUODENOSCOPY WITH BIOPSY - FLEXIBLE N/A 06/19/2019    Performed by Dawna Part, MD at [facility] ENDO   ? ESOPHAGOGASTRODUODENOSCOPY [WITH APC]  WITH SPECIMEN COLLECTION BY BRUSHING/ WASHING N/A 08/07/2019    Performed by Dawna Part, MD at [facility] ENDO   ? ESOPHAGOGASTRODUODENOSCOPY WITH SPECIMEN COLLECTION BY BRUSHING/ WASHING N/A 09/10/2019    Performed by Buckles, Vinnie Level, MD at [facility] ENDO   ? ESOPHAGOGASTRODUODENOSCOPY WITH CONTROL OF BLEEDING - FLEXIBLE N/A 09/10/2019    Performed by Buckles, Vinnie Level, MD at [facility] ENDO   ? ESOPHAGOGASTRODUODENOSCOPY WITH CONTROL OF BLEEDING - FLEXIBLE N/A 02/12/2020    Performed by Lenor Derrick, MD at [facility] ENDO   ? ESOPHAGOGASTRODUODENOSCOPY WITH BIOPSY - FLEXIBLE N/A 02/26/2020    Performed by Buckles, Vinnie Level, MD at [facility] ENDO   ? ESOPHAGOGASTRODUODENOSCOPY WITH BIOPSY - FLEXIBLE N/A 05/18/2020    Performed by Lenor Derrick, MD at [facility] ENDO   ? ESOPHAGOGASTRODUODENOSCOPY WITH CONTROL OF BLEEDING - FLEXIBLE N/A 05/18/2020    Performed by Lenor Derrick, MD at [facility] ENDO   ? CHOLECYSTECTOMY     ? COLONOSCOPY     ? ESOPHAGOGASTRIC FUNDOPLICATION  2003, 2004    laparoscopic   ? ESOPHAGOGASTRIC FUNDOPLICATION     ? HX HYSTERECTOMY     ? PR ESOPHAGOSCOPY FLEXIBLE TRANSORAL DIAGNOSTIC        Social History     Tobacco Use   ? Smoking status: Never Smoker   ? Smokeless tobacco: Never Used   Substance Use Topics   ? Alcohol use: No      Family History   Problem Relation Age of Onset   ? Other Mother    ? Kidney Failure Mother    ? Osteoporosis Mother    ? Cancer Father         esophageal   ? Liver Disease Sister         endstage, s/p liver transplant   ? Cancer  Brother         tonsil, liver    ? Hip Fracture Neg Hx       Medications Prior to Admission   Medication Sig Dispense Refill Last Dose   ? cetirizine (ZYRTEC) 10 mg tablet Take 10 mg by mouth daily as needed for Allergy symptoms.      ? ciprofloxacin (CIPRO) 500 mg tablet Take one tablet by mouth daily. 30 tablet 1    ? duloxetine DR (CYMBALTA) 60 mg capsule Take 60 mg by mouth daily.      ? midodrine (PROAMITINE) 10 mg tablet Take one tablet by mouth three times daily. 90 tablet 1    ? pantoprazole DR (PROTONIX) 40 mg tablet TAKE 1 TABLET BY MOUTH TWICE A DAY 180 tablet 1    ? polyethylene glycol 3350 (MIRALAX) 17 g packet Take one packet by mouth twice daily.      ? promethazine (PHENERGAN) 25 mg tablet Take 25 mg by mouth every 4 hours as needed. Just uses it with Tramadol      ? rifAXIMin (XIFAXAN) 550 mg tablet Take one tablet by mouth twice daily. 180 tablet 3    ? traMADoL (ULTRAM) 50 mg tablet Take 50 mg by mouth every 8 hours as needed for Pain.      ? zinc sulfate 220 mg (50 mg elemental zinc) capsule Take one capsule by mouth daily. 90 capsule 3      Allergies   Allergen Reactions   ? Tegaderm BLISTERS   ? Adhesive Tape (Rosins) RASH     Other reaction(s): irritated skin   ? Sulfa (Sulfonamide Antibiotics) RASH     Other reaction(s): Unknown Reaction   ? Codeine NAUSEA AND VOMITING   ? Morphine SEE COMMENTS     Pt reports burns her veins  Other reaction(s): Unknown Reaction   ? Penicillin G SEE COMMENTS     Throat issues, blisters in throat    ? Tramadol NAUSEA ONLY and ITCHING     Takes this medication regularly        Review of Systems Constitutional: negative for fevers  Respiratory: positive for dyspnea on exertion  Cardiovascular: negative for chest pain  Gastrointestinal: positive for nausea and abdominal pain    Physical Exam:  Vital Signs: Last Filed In 24 Hours Vital Signs: 24 Hour Range                General appearance: alert and no distress noted.  Neurologic: Grossly normal.  Lungs: Non labored.  Heart: regular rate and rhythm  Abdomen: distended    Pre-procedure anxiolysis plan: N/A  Sedation/Medication Plan: Local anesthetic  Personal history of sedation complications: Denies adverse event.   Family history of sedation complications: Denies adverse event.   Medications for Reversal: NA  Discussion/Reviews:  Physician has discussed risks and alternatives of this type of sedation and above planned procedures with patient    NPO Status: NA  Airway:  NA  Head and Neck: NA  Mouth: NA   Anesthesia Classification:  ASA III (A patient with a severe systemic disease that limits activity, but is not incapacitating)  Pregnancy Status: N/A    Lab/Radiology/Other Diagnostic Tests:  Labs:  Pertinent labs reviewed           Kathleen Lime, APRN-NP  Pager [phone]

## 2020-05-27 NOTE — Progress Notes
Patients hemoglobin resulted at 6.7, Kristin Moody contacted. Per [name] patient to be transported to [facility] on [facility] 9th floor. PIV left in for use in the infusion clinic. Reviewed discharge instructions with patient. Pt verbalizes understanding and had no further questions at this time.

## 2020-05-27 NOTE — Patient Instructions
Your blood pressure ended a little lower.  Please monitor your blood pressure at home and also monitor for worsening symptoms like dizziness, etc.  If the top number is consistently below 90, call your doctor and/or go to the emergency room.      HEART FAILURE INFUSION CLINIC  OUTPATIENT POST TRANSFUSION INSTRUCTIONS     During your transfusion you were monitored by nursing staff for signs of a transfusion reaction, however, you should continue to observe for the following symptoms after you have been dismissed from the clinic:     FEVER OF 100.5 OR HIGHER  CHILLS  NAUSEA OR VOMITING  FEELING FAINT OR DIZZY  SHORTNESS OF BREATH  DARK OR RED COLORED URINE  CHEST OR BACK PAIN  SHOCK OR LOSS OF CONSCIOUSNESS  YELLOWING OF THE EYES OR SKIN     If you or your caregiver notice any of these symptoms, contact your ordering physician immediately and go to the nearest Emergency Department for treatment. When you arrive at the Emergency Department notify them that you recently received a blood transfusion. *For more detailed signs and symptoms of a transfusion reaction, please refer to the [place] education information below.          When You Need a Blood Transfusion (Adult)  A blood transfusion may be done when you have lost blood because of an injury or during surgery. It can also be done because of diseases or conditions that affect the blood. Blood is made up of several different parts (blood products). You may receive some or all of these blood products during a transfusion. Blood for transfusion is usually donated from another person (donor). Strict measures are taken to make sure that donated blood is safe before it's given to you. This sheet helps you understand how a blood transfusion is done. Your healthcare provider will discuss your condition with you and answer your questions.   The parts of blood  Blood can be broken down into different parts that perform special roles in the body. These parts include:  ? Red blood cells, which carry oxygen throughout the body.  ? Platelets, which help stop bleeding.  ? Plasma (the liquid part of blood), which carries red blood cells and platelets throughout the body. Plasma also helps platelets in stopping bleeding.  Where does donated blood come from?  ? Volunteer donors. These are people who donate their blood to help others in need of blood. Blood donation can take place at several places, including a hospital, blood bank, or during a blood drive.  ? Directed donation. If you need a blood transfusion during a planned surgery, family and friends can have their blood tested for compatibility and donate blood for you before the surgery. This needs to be done at least 7 day(s) in advance. This is because the blood must be tested for safety.  ? Autologous donation. This is also called self-donation. For planned surgery, you can donate your own blood starting up to 6 weeks before surgery.  Are blood transfusions safe?  Donated blood is tested and processed to make sure that the blood is safe:  ? The health and medical history of each donor is carefully screened. If a person is considered high-risk for infection or problems, he or she isn't accepted as a blood donor.  ? Donated blood is tested for infections such as hepatitis, syphilis, and HIV (the virus that causes AIDS). If the tested blood is found to be unsafe, it's destroyed.  ? Blood  is divided into four types: A, B, AB, and O. Blood also has Rh types: positive (+) and negative (-). You can only receive blood products that are compatible with (match) your blood type. A sample of your blood is tested for compatibility with donated blood. This is done before blood products are prepared for a transfusion.  How is a blood transfusion done?  A blood transfusion takes place in a blood center, infusion center, hospital room, or operating room. Your healthcare provider will discuss the blood transfusion with you before it's done. You'll need to give permission for the blood transfusion by signing a consent form.  ? Two healthcare providers confirm your identity. They also confirm that they have the correct blood product(s) for you.  ? An intravenous (IV) line is placed in a vein if you do not already have an IV.  ? The blood product comes in a plastic bag that is hung on an IV pole. The blood product flows from the bag into your IV line. The IV line may be connected to a pump, which controls the transfusion rate. You may receive more than one kind of blood product through the IV.  ? Your vital signs (blood pressure, heart rate, respiratory rate, and temperature) are checked throughout the transfusion. This is to make sure you are not having a reaction to the blood product.  ? The IV line may be removed once the transfusion is complete.  Possible risks and complications of blood transfusions  Most transfusions are problem free. In some cases, reactions occur. These can happen within seconds to minutes during the transfusion or a week to a few months after the transfusion. Call your doctor or nurse right away if you have any of the signs or symptoms in the table below during or after a transfusion:  Reaction Timing Symptoms   Allergic reaction (mild) ? Within seconds to minutes during the transfusion  ? Up to 24 hours after the transfusion Hives or red welts on the skin, mild itching, rash, localized swelling, flushing (red face), wheezing, shortness of breath, or stridor (high-pitched noise or sound)   Anaphylactic reaction ? Within seconds to minutes during the transfusion  ? Up to 24 hours after the transfusion Shortness of breath, flushing (red face), wheezing, labored (working hard) breathing, low blood pressure, localized swelling, chest tightness, or cramps   Febrile nonhemolytic reaction ? Within minutes to hours during the transfusion  ? Within a few hours to 24 hours after the transfusion Fever (increase of 1? C or higher), chills, flushing (red face), nausea, headache, minor discomfort, or mild shortness of breath   Acute immune hemolytic reaction ? Within minutes during the transfusion  ? Up to 24 hours after the transfusion Fever, red or brown urine, back pain, fast heart rate (tachycardia), abdominal pain, low blood pressure, feeling anxious, chills, chest pain, nausea, or fainting spells   Transfusion-related acute lung injury (TRALI) ? Within 1 to 2 hours during the transfusion  ? Up to 6 hours after the transfusion Shortness of breath, trouble breathing, low blood pressure, fever, pulmonary edema   Transfusion-associated circulatory overload ? Near the end of the transfusion  ? Within 6 hours after the transfusion Shortness of breath, fast heart rate (tachycardia), problems breathing when lying on back, abnormal blood pressure   Post-transfusion purpura (PUP) ? Within 1 week  ? Up to 48 days after the transfusion Purple spots on skin; nose bleed; bleeding from the urinary tract, abdomen, colon, or rectum;  fever; or chills     Delayed transfusion-related acute lung injury (TRALI) ? Within 72 hours (3 days) after the transfusion Sudden onset of respiratory distress or trouble breathing   Delayed hemolytic reaction ? Within 3 to 7 days  ? Up to weeks after the transfusion Low-grade fever, mild jaundice (yellowing of the skin and whites of the eyes), decrease in hematocrit, chills, chest pain, back pain, nausea   ? 2000-2019 [employer], [place]. [address] [place], [place] [address]. All rights reserved. This information is not intended as a substitute for professional medical care. Always follow your healthcare professional's instructions.

## 2020-05-27 NOTE — Patient Instructions
PARACENTESIS   A paracentesis is the removal of an abnormal buildup of fluid in your abdominal cavity. This fluid buildup is called ascites and may be caused by conditions such as liver disease, heart failure, or cancer. During this procedure, a needle is inserted into your abdomen to drain the fluid. The fluid may then be sent to the lab for testing if medically indicated. Removal of the fluid may also relieve belly pressure and shortness of breath caused by the ascites.?  POST-PROCEDURE PAIN:   ? Pain control following your procedure is a priority for both you and your Physicians.  ? Some soreness or tenderness at the site is to be expected for several days. We recommend taking over the counter analgesics to help relieve this pain.  ? Alternative methods for pain relief include but not limited to heat or cold compress, relaxation techniques, rest, and changing of positions.  ? If pain continues after 5-7 days or you have severe pain not relieved by medication, please contact us as directed below.?  POST-PROCEDURE ACTIVITY:   ? A responsible adult must drive you home.  ? If you receive sedation, narcotic pain medication or anesthesia for the procedure, you should not drive or operate heavy machinery or do anything that requires concentration for at least 24 hours after procedure completion.  ? It is recommended that a responsible adult be with you until morning.?  POST-PROCEDURE SITE CARE:   ? You will have a small bandage over the procedure site. Keep this dry.  ? You may remove it in 24 hours.  ? You may shower in 24 hours, after removing the bandage.  ? A dry gauze bandage may be reapplied as necessary to protect your clothing as the site may sometimes leak for several days after the procedure.  ? Do not submerge the procedure site for 1 week (no bathtub, swimming, hot tub, etc.)  ? Do not use ointments, creams or powders on the puncture site.  ? Be sure your hands are clean when touching near the site.? DIET/MEDICATIONS:   ? You may resume your previous diet after the procedure.  ? If you receive sedation or narcotic pain medications, avoid any foods or beverages containing alcohol for at least 24 hours after the procedure.  ? Please see the Medication Reconciliation sheet for instructions regarding resuming your home medications.?  CALL THE DOCTOR IF:   ? Bright red blood soaks the bandage.  ? You have pain not relieved by medication. Some soreness at the site is to be expected.  ? You have signs of infection such as: fever greater than 101F, chills, redness, warmth, swelling, drainage or pus from the puncture site.  For any of the above symptoms or for problems or concerns related to the procedure performed at the [place] Location, call [phone] Monday-Friday from 7-5p. After-hours and weekends, please call [phone] and ask for the Interventional Radiology Resident on-call.   You or your caregiver should call 911 for any severe symptoms such as excessive bleeding, severe dizziness, trouble breathing or loss of consciousness.   ?  ?

## 2020-05-28 LAB — GRAM STAIN

## 2020-05-31 ENCOUNTER — Encounter: Admit: 2020-05-31 | Discharge: 2020-05-31 | Payer: MEDICARE

## 2020-05-31 ENCOUNTER — Ambulatory Visit: Admit: 2020-05-31 | Discharge: 2020-05-31 | Payer: MEDICARE

## 2020-05-31 DIAGNOSIS — R945 Abnormal results of liver function studies: Secondary | ICD-10-CM

## 2020-05-31 DIAGNOSIS — K7460 Unspecified cirrhosis of liver: Secondary | ICD-10-CM

## 2020-05-31 DIAGNOSIS — E119 Type 2 diabetes mellitus without complications: Secondary | ICD-10-CM

## 2020-05-31 DIAGNOSIS — Z01810 Encounter for preprocedural cardiovascular examination: Secondary | ICD-10-CM

## 2020-05-31 DIAGNOSIS — K7469 Other cirrhosis of liver: Secondary | ICD-10-CM

## 2020-05-31 DIAGNOSIS — R0600 Dyspnea, unspecified: Secondary | ICD-10-CM

## 2020-05-31 DIAGNOSIS — K31819 Angiodysplasia of stomach and duodenum without bleeding: Secondary | ICD-10-CM

## 2020-05-31 DIAGNOSIS — I729 Aneurysm of unspecified site: Secondary | ICD-10-CM

## 2020-05-31 DIAGNOSIS — I10 Essential (primary) hypertension: Secondary | ICD-10-CM

## 2020-05-31 DIAGNOSIS — D649 Anemia, unspecified: Secondary | ICD-10-CM

## 2020-05-31 DIAGNOSIS — K5730 Diverticulosis of large intestine without perforation or abscess without bleeding: Secondary | ICD-10-CM

## 2020-05-31 DIAGNOSIS — J329 Chronic sinusitis, unspecified: Secondary | ICD-10-CM

## 2020-05-31 DIAGNOSIS — K760 Fatty (change of) liver, not elsewhere classified: Secondary | ICD-10-CM

## 2020-05-31 DIAGNOSIS — D500 Iron deficiency anemia secondary to blood loss (chronic): Secondary | ICD-10-CM

## 2020-05-31 DIAGNOSIS — E669 Obesity, unspecified: Secondary | ICD-10-CM

## 2020-05-31 LAB — COMPREHENSIVE METABOLIC PANEL
Lab: 1.1 mg/dL — ABNORMAL HIGH (ref 0.4–1.00)
Lab: 1.6 mg/dL — ABNORMAL HIGH (ref 0.3–1.2)
Lab: 131 MMOL/L — ABNORMAL LOW (ref 137–147)
Lab: 143 U/L — ABNORMAL HIGH (ref 25–110)
Lab: 143 mg/dL — ABNORMAL HIGH (ref 70–100)
Lab: 20 U/L (ref 7–56)
Lab: 22 MMOL/L (ref 21–30)
Lab: 3.1 g/dL — ABNORMAL LOW (ref 3.5–5.0)
Lab: 35 mg/dL — ABNORMAL HIGH (ref 7–25)
Lab: 4.9 MMOL/L — ABNORMAL LOW (ref 3.5–5.1)
Lab: 45 U/L — ABNORMAL HIGH (ref 7–40)
Lab: 49 mL/min — ABNORMAL LOW (ref >60)
Lab: 5.6 g/dL — ABNORMAL LOW (ref 6.0–8.0)
Lab: 6 K/UL — ABNORMAL LOW (ref 3–12)
Lab: 60 mL/min — ABNORMAL LOW (ref >60)
Lab: 8.7 mg/dL (ref 8.5–10.6)

## 2020-05-31 LAB — CBC AND DIFF
Lab: 0 10*3/uL (ref 0–0.20)
Lab: 2.8 M/UL — ABNORMAL LOW (ref 4.0–5.0)
Lab: 7.5 10*3/uL (ref 4.5–11.0)

## 2020-05-31 LAB — PROTIME INR (PT): Lab: 1.4 MMOL/L — ABNORMAL HIGH (ref 0.8–1.2)

## 2020-05-31 LAB — ALPHA FETO PROTEIN (AFP): Lab: 4 ng/mL (ref 0.0–15.0)

## 2020-06-01 ENCOUNTER — Encounter: Admit: 2020-06-01 | Discharge: 2020-06-01 | Payer: MEDICARE

## 2020-06-01 DIAGNOSIS — D649 Anemia, unspecified: Secondary | ICD-10-CM

## 2020-06-01 DIAGNOSIS — K31819 Angiodysplasia of stomach and duodenum without bleeding: Secondary | ICD-10-CM

## 2020-06-02 ENCOUNTER — Ambulatory Visit: Admit: 2020-06-02 | Discharge: 2020-06-03 | Payer: MEDICARE

## 2020-06-02 ENCOUNTER — Encounter: Admit: 2020-06-02 | Discharge: 2020-06-02 | Payer: MEDICARE

## 2020-06-02 DIAGNOSIS — D649 Anemia, unspecified: Secondary | ICD-10-CM

## 2020-06-02 NOTE — Patient Instructions
[facility]  OUTPATIENT POST TRANSFUSION INSTRUCTIONS     During your transfusion you were monitored by nursing staff for signs of a transfusion reaction, however, you should continue to observe for the following symptoms after you have been dismissed from the clinic:     FEVER OF 100.5 OR HIGHER  CHILLS  NAUSEA OR VOMITING  FEELING FAINT OR DIZZY  SHORTNESS OF BREATH  DARK OR RED COLORED URINE  CHEST OR BACK PAIN  SHOCK OR LOSS OF CONSCIOUSNESS  YELLOWING OF THE EYES OR SKIN     If you or your caregiver notice any of these symptoms, contact your ordering physician immediately and go to the nearest Emergency Department for treatment. When you arrive at the Emergency Department notify them that you recently received a blood transfusion. *For more detailed signs and symptoms of a transfusion reaction, please refer to the [place] education information below.          When You Need a Blood Transfusion (Adult)  A blood transfusion may be done when you have lost blood because of an injury or during surgery. It can also be done because of diseases or conditions that affect the blood. Blood is made up of several different parts (blood products). You may receive some or all of these blood products during a transfusion. Blood for transfusion is usually donated from another person (donor). Strict measures are taken to make sure that donated blood is safe before it's given to you. This sheet helps you understand how a blood transfusion is done. Your healthcare provider will discuss your condition with you and answer your questions.   The parts of blood  Blood can be broken down into different parts that perform special roles in the body. These parts include:  ? Red blood cells, which carry oxygen throughout the body.  ? Platelets, which help stop bleeding.  ? Plasma (the liquid part of blood), which carries red blood cells and platelets throughout the body. Plasma also helps platelets in stopping bleeding. Where does donated blood come from?  ? Volunteer donors. These are people who donate their blood to help others in need of blood. Blood donation can take place at several places, including a hospital, blood bank, or during a blood drive.  ? Directed donation. If you need a blood transfusion during a planned surgery, family and friends can have their blood tested for compatibility and donate blood for you before the surgery. This needs to be done at least 7 day(s) in advance. This is because the blood must be tested for safety.  ? Autologous donation. This is also called self-donation. For planned surgery, you can donate your own blood starting up to 6 weeks before surgery.  Are blood transfusions safe?  Donated blood is tested and processed to make sure that the blood is safe:  ? The health and medical history of each donor is carefully screened. If a person is considered high-risk for infection or problems, he or she isn't accepted as a blood donor.  ? Donated blood is tested for infections such as hepatitis, syphilis, and HIV (the virus that causes AIDS). If the tested blood is found to be unsafe, it's destroyed.  ? Blood is divided into four types: A, B, AB, and O. Blood also has Rh types: positive (+) and negative (-). You can only receive blood products that are compatible with (match) your blood type. A sample of your blood is tested for compatibility with donated blood. This is  done before blood products are prepared for a transfusion.  How is a blood transfusion done?  A blood transfusion takes place in a blood center, infusion center, hospital room, or operating room. Your healthcare provider will discuss the blood transfusion with you before it's done. You'll need to give permission for the blood transfusion by signing a consent form.  ? Two healthcare providers confirm your identity. They also confirm that they have the correct blood product(s) for you.  ? An intravenous (IV) line is placed in a vein if you do not already have an IV.  ? The blood product comes in a plastic bag that is hung on an IV pole. The blood product flows from the bag into your IV line. The IV line may be connected to a pump, which controls the transfusion rate. You may receive more than one kind of blood product through the IV.  ? Your vital signs (blood pressure, heart rate, respiratory rate, and temperature) are checked throughout the transfusion. This is to make sure you are not having a reaction to the blood product.  ? The IV line may be removed once the transfusion is complete.  Possible risks and complications of blood transfusions  Most transfusions are problem free. In some cases, reactions occur. These can happen within seconds to minutes during the transfusion or a week to a few months after the transfusion. Call your doctor or nurse right away if you have any of the signs or symptoms in the table below during or after a transfusion:  Reaction Timing Symptoms   Allergic reaction (mild) ? Within seconds to minutes during the transfusion  ? Up to 24 hours after the transfusion Hives or red welts on the skin, mild itching, rash, localized swelling, flushing (red face), wheezing, shortness of breath, or stridor (high-pitched noise or sound)   Anaphylactic reaction ? Within seconds to minutes during the transfusion  ? Up to 24 hours after the transfusion Shortness of breath, flushing (red face), wheezing, labored (working hard) breathing, low blood pressure, localized swelling, chest tightness, or cramps   Febrile nonhemolytic reaction ? Within minutes to hours during the transfusion  ? Within a few hours to 24 hours after the transfusion Fever (increase of 1? C or higher), chills, flushing (red face), nausea, headache, minor discomfort, or mild shortness of breath   Acute immune hemolytic reaction ? Within minutes during the transfusion  ? Up to 24 hours after the transfusion Fever, red or brown urine, back pain, fast heart rate (tachycardia), abdominal pain, low blood pressure, feeling anxious, chills, chest pain, nausea, or fainting spells   Transfusion-related acute lung injury (TRALI) ? Within 1 to 2 hours during the transfusion  ? Up to 6 hours after the transfusion Shortness of breath, trouble breathing, low blood pressure, fever, pulmonary edema   Transfusion-associated circulatory overload ? Near the end of the transfusion  ? Within 6 hours after the transfusion Shortness of breath, fast heart rate (tachycardia), problems breathing when lying on back, abnormal blood pressure   Post-transfusion purpura (PUP) ? Within 1 week  ? Up to 48 days after the transfusion Purple spots on skin; nose bleed; bleeding from the urinary tract, abdomen, colon, or rectum; fever; or chills     Delayed transfusion-related acute lung injury (TRALI) ? Within 72 hours (3 days) after the transfusion Sudden onset of respiratory distress or trouble breathing   Delayed hemolytic reaction ? Within 3 to 7 days  ? Up to weeks after the  transfusion Low-grade fever, mild jaundice (yellowing of the skin and whites of the eyes), decrease in hematocrit, chills, chest pain, back pain, nausea   ? 2000-2019 [employer], [place]. [address] [place], [place] [address]. All rights reserved. This information is not intended as a substitute for professional medical care. Always follow your healthcare professional's instructions.

## 2020-06-02 NOTE — Progress Notes
Patient presents to [facility] for blood transfusion. Upon arrival she is rating pain in abdomen and lower back 8/10, describing the pain as constant. VSS. She also reports nausea that started this morning. Patient requesting I notify Beverely Risen, RN working with Dr. Ladona Ridgel and ask if they can order pain medication for here or at home.    Beverely Risen, RN notified. Orders for 1000 mg PO tylenol now with a limit of 2,000 mg daily. Patient updated with orders but is refusing to take tylenol because it stops her from urinating. Tara notified.    Per [name], patient can either take 1000 mg tylenol now or take her prescribed tramadol with food when she gets home. [name] to f/u with Dr. Ladona Ridgel [id number] CT to reassess patient's abdominal AA as she suspects this may be bothering her. Patient updated with plan of care, verbalized understanding and reports she will hold off on taking any tylenol at this time. Will continue to monitor closely.

## 2020-06-03 ENCOUNTER — Ambulatory Visit: Admit: 2020-06-03 | Discharge: 2020-06-03 | Payer: MEDICARE

## 2020-06-03 ENCOUNTER — Encounter: Admit: 2020-06-03 | Discharge: 2020-06-03 | Payer: MEDICARE

## 2020-06-03 DIAGNOSIS — K31819 Angiodysplasia of stomach and duodenum without bleeding: Principal | ICD-10-CM

## 2020-06-03 DIAGNOSIS — D649 Anemia, unspecified: Secondary | ICD-10-CM

## 2020-06-03 DIAGNOSIS — R945 Abnormal results of liver function studies: Secondary | ICD-10-CM

## 2020-06-03 DIAGNOSIS — K7469 Other cirrhosis of liver: Secondary | ICD-10-CM

## 2020-06-03 DIAGNOSIS — K760 Fatty (change of) liver, not elsewhere classified: Secondary | ICD-10-CM

## 2020-06-03 DIAGNOSIS — D500 Iron deficiency anemia secondary to blood loss (chronic): Secondary | ICD-10-CM

## 2020-06-03 LAB — COMPREHENSIVE METABOLIC PANEL
Lab: 1.3 mg/dL — ABNORMAL HIGH (ref 0.4–1.00)
Lab: 103 MMOL/L (ref 98–110)
Lab: 127 mg/dL — ABNORMAL HIGH (ref 70–100)
Lab: 133 MMOL/L — ABNORMAL LOW (ref 137–147)
Lab: 146 U/L — ABNORMAL HIGH (ref 25–110)
Lab: 19 U/L — ABNORMAL HIGH (ref 7–56)
Lab: 2 mg/dL — ABNORMAL HIGH (ref 0.3–1.2)
Lab: 2.9 g/dL — ABNORMAL LOW (ref 3.5–5.0)
Lab: 22 MMOL/L — ABNORMAL LOW (ref 21–30)
Lab: 38 mg/dL — ABNORMAL HIGH (ref 7–25)
Lab: 4.5 MMOL/L — ABNORMAL LOW (ref 3.5–5.1)
Lab: 41 mL/min — ABNORMAL LOW (ref >60)
Lab: 42 U/L — ABNORMAL HIGH (ref 7–40)
Lab: 5.7 g/dL — ABNORMAL LOW (ref 6.0–8.0)
Lab: 50 mL/min — ABNORMAL LOW (ref >60)
Lab: 8 10*3/uL (ref 3–12)
Lab: 8.6 mg/dL (ref 8.5–10.6)

## 2020-06-03 LAB — CBC AND DIFF
Lab: 2.8 M/UL — ABNORMAL LOW (ref 4.0–5.0)
Lab: 22 % — ABNORMAL LOW (ref 36–45)
Lab: 8.9 10*3/uL (ref 4.5–11.0)

## 2020-06-03 LAB — GRAM STAIN

## 2020-06-03 LAB — PROTIME INR (PT): Lab: 1.4 g/dL — ABNORMAL HIGH (ref 0.8–1.2)

## 2020-06-03 MED ORDER — ALBUMIN, HUMAN 25 % IV SOLP
0 refills | Status: CP
Start: 2020-06-03 — End: ?

## 2020-06-03 NOTE — Progress Notes
Pt site clean, dry and intact. IV removed. Discharge instructions reviewed. Pt has driver. Pt in wheelchair to front lobby to get in private vehicle.

## 2020-06-03 NOTE — Other
Immediate Post Procedure Note    Date:  06/03/2020                                         Attending Physician: Judi Saa MD  Operating Physician: Wilkie Aye, M.D.    Consent:  Consent obtained from patient.  Time out performed: Consent obtained, correct patient verified, correct procedure verified, correct site verified, patient marked as necessary.  Pre/Post Procedure Diagnosis/Indication:  Patient with ascites secondary to cirrhosis    Anesthesia: local anesthetic only  Procedure(s):  paracentesis. Please see separate PACS dictation for further detail.  Findings: large amount of ascites    Estimated Blood Loss:  None/Negligible  Specimen(s) Removed/Disposition:  Ascites, sample sent to laboratory for testing  Complications: None  Patient Tolerated Procedure: Well  Post-Procedure Condition:  Stable

## 2020-06-03 NOTE — H&P (View-Only)
IR Pre-Procedure History and Physical/Sedation Plan    Procedure Date: 06/03/2020     Planned Procedure(s):  Ultrasound-guided paracentesis     Indication:  Fluid testing; Therapeutic drainage  __________________________________________________________________    Chief Complaint:  Ascites    History of Present Illness: Kristin Moody is a 64 y.o. female with a history as listed below who presents today for procedure.    Patient Active Problem List    Diagnosis Date Noted   ? 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 Hypotension 02/10/2020   ? 'Light-for-dates' infant with signs of fetal malnutrition 01/22/2020   ? 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 Anemia 09/28/2019   ? Encephalopathy 08/12/2019   ? Spasticity 08/10/2019   ? Hyperreflexia 08/10/2019   ? 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 Right leg weakness 05/14/2019   ? 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 Iron (Fe) deficiency anemia 01/19/2019   ? Preop cardiovascular exam 01/07/2019   ? Pre-transplant evaluation for liver transplant 01/06/2019   ? Cirrhosis (HCC) 12/16/2018   ? Acute on chronic anemia 12/16/2018   ? Lactic acidosis 12/16/2018   ? GI bleed 10/24/2018   ? Portal hypertension (HCC) 08/28/2018   ? Confusion 08/27/2018   ? Dyspnea 08/27/2018   ? Chronic abdominal pain 08/27/2018   ? 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 Acute on chronic blood loss anemia 08/27/2018   ? Iron deficiency anemia 04/13/2018   ? Pneumonia due to infectious organism 04/13/2018   ? Melena 04/10/2018   ? Esophageal varices without bleeding (HCC) 02/25/2018   ? Cirrhosis of liver without ascites (HCC) 02/25/2018   ? 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 Lower abdominal pain 10/24/2014   ? Chest discomfort 09/21/2014   ? Essential hypertension 08/13/2012   ? Abnormal liver function tests 05/13/2011   ? Fatty liver disease, nonalcoholic 05/13/2011   ? Obesity (BMI 30-39.9) 05/13/2011   ? Slow transit constipation 05/13/2011     Medical History:   Diagnosis Date   ? Aneurysm (HCC)    ? 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 Diverticulosis of colon     descending and sigmoid colon   ? Dyspnea    ? Essential hypertension 08/13/2012   ? Fatty infiltration of liver    ? HTN (hypertension)    ? Obesity (BMI 30-39.9) 05/13/2011   ? Preop cardiovascular exam 01/07/2019   ? Sinus infection    ? Type 2 diabetes mellitus (HCC) 09/21/2014      Surgical History:   Procedure Laterality Date   ? HYSTERECTOMY  1994   ? UPPER GASTROINTESTINAL ENDOSCOPY  2009   ? COLONOSCOPY  2009   ? CYSTOCELE REPAIR  2009    with endocele repair   ? RECTOCELE REPAIR  03/2009   ? LIVER BIOPSY  07/17/2010   ? COLONOSCOPY N/A 11/08/2017    Performed by Dawna Part, MD at [facility] ENDO   ? ESOPHAGOGASTRODUODENOSCOPY N/A 11/08/2017    Performed by Dawna Part, MD at [facility] ENDO   ? ESOPHAGOGASTRODUODENOSCOPY WITH BIOPSY - FLEXIBLE  11/08/2017    Performed by Dawna Part, MD at [facility] ENDO   ? COLONOSCOPY WITH HOT BIOPSY FORCEPS REMOVAL TUMOR/ POLYP/ OTHER LESION  11/08/2017    Performed by Dawna Part, MD at [facility] ENDO   ? ESOPHAGOGASTRODUODENOSCOPY WITH BAND LIGATION ESOPHAGEAL/ GASTRIC  VARICES - FLEXIBLE N/A 04/10/2018    Performed by Normajean Baxter, MD at [facility] ENDO   ? ESOPHAGOGASTRODUODENOSCOPY WITH BIOPSY - FLEXIBLE N/A 04/10/2018    Performed by Normajean Baxter, MD at [facility] ENDO   ? ESOPHAGOGASTRODUODENOSCOPY WITH CONTROL OF BLEEDING - FLEXIBLE N/A 04/25/2018    Performed by Jolee Ewing, MD at [facility] ENDO   ? ESOPHAGOGASTRODUODENOSCOPY WITH BIOPSY - FLEXIBLE with push enteroscopy N/A 08/28/2018    Performed by Celesta Gentile, MD at [facility] ENDO   ? EGD N/A 10/27/2018    Performed by Eliott Nine, MD at [facility] ENDO   ? ESOPHAGOGASTRODUODENOSCOPY WITH SNARE REMOVAL TUMOR/ POLYP/ OTHER LESION - FLEXIBLE N/A 10/27/2018    Performed by Eliott Nine, MD at [facility] ENDO   ? ESOPHAGOGASTRODUODENOSCOPY WITH BIOPSY - FLEXIBLE N/A 12/16/2018    Performed by Buckles, Vinnie Level, MD at [facility] ENDO   ? ESOPHAGOGASTRODUODENOSCOPY WITH CONTROL OF BLEEDING - FLEXIBLE N/A 12/16/2018    Performed by Buckles, Vinnie Level, MD at [facility] ENDO   ? ESOPHAGOGASTRODUODENOSCOPY WITH SPECIMEN COLLECTION BY BRUSHING/ WASHING N/A 01/22/2019    Performed by Veneta Penton, MD at [facility] ENDO   ? ESOPHAGOGASTRODUODENOSCOPY WITH DILATION ESOPHAGUS WITH BALLOON 30 MM OR GREATER - FLEXIBLE N/A 01/22/2019    Performed by Veneta Penton, MD at [facility] ENDO   ? ESOPHAGOGASTRODUODENOSCOPY WITH BIOPSY - FLEXIBLE N/A 01/22/2019    Performed by Veneta Penton, MD at [facility] ENDO   ? ESOPHAGOGASTRODUODENOSCOPY WITH BIOPSY - FLEXIBLE N/A 06/19/2019    Performed by Dawna Part, MD at [facility] ENDO   ? ESOPHAGOGASTRODUODENOSCOPY [WITH APC]  WITH SPECIMEN COLLECTION BY BRUSHING/ WASHING N/A 08/07/2019    Performed by Dawna Part, MD at [facility] ENDO   ? ESOPHAGOGASTRODUODENOSCOPY WITH SPECIMEN COLLECTION BY BRUSHING/ WASHING N/A 09/10/2019    Performed by Buckles, Vinnie Level, MD at [facility] ENDO   ? ESOPHAGOGASTRODUODENOSCOPY WITH CONTROL OF BLEEDING - FLEXIBLE N/A 09/10/2019    Performed by Buckles, Vinnie Level, MD at [facility] ENDO   ? ESOPHAGOGASTRODUODENOSCOPY WITH CONTROL OF BLEEDING - FLEXIBLE N/A 02/12/2020    Performed by Lenor Derrick, MD at [facility] ENDO   ? ESOPHAGOGASTRODUODENOSCOPY WITH BIOPSY - FLEXIBLE N/A 02/26/2020    Performed by Buckles, Vinnie Level, MD at [facility] ENDO   ? ESOPHAGOGASTRODUODENOSCOPY WITH BIOPSY - FLEXIBLE N/A 05/18/2020    Performed by Lenor Derrick, MD at [facility] ENDO   ? ESOPHAGOGASTRODUODENOSCOPY WITH CONTROL OF BLEEDING - FLEXIBLE N/A 05/18/2020    Performed by Lenor Derrick, MD at [facility] ENDO   ? CHOLECYSTECTOMY     ? COLONOSCOPY     ? ESOPHAGOGASTRIC FUNDOPLICATION  2003, 2004    laparoscopic   ? ESOPHAGOGASTRIC FUNDOPLICATION     ? HX HYSTERECTOMY     ? PR ESOPHAGOSCOPY FLEXIBLE TRANSORAL DIAGNOSTIC        Social History     Tobacco Use   ? Smoking status: Never Smoker   ? Smokeless tobacco: Never Used Substance Use Topics   ? Alcohol use: No      Family History   Problem Relation Age of Onset   ? Other Mother    ? Kidney Failure Mother    ? Osteoporosis Mother    ? Cancer Father         esophageal   ? Liver Disease Sister         endstage, s/p liver transplant   ? Cancer Brother         tonsil, liver    ?  Hip Fracture Neg Hx       Medications Prior to Admission   Medication Sig Dispense Refill Last Dose   ? cetirizine (ZYRTEC) 10 mg tablet Take 10 mg by mouth daily as needed for Allergy symptoms.      ? ciprofloxacin (CIPRO) 500 mg tablet Take one tablet by mouth daily. 30 tablet 1    ? duloxetine DR (CYMBALTA) 60 mg capsule Take 60 mg by mouth daily.      ? midodrine (PROAMITINE) 10 mg tablet Take one tablet by mouth three times daily. 90 tablet 1    ? pantoprazole DR (PROTONIX) 40 mg tablet TAKE 1 TABLET BY MOUTH TWICE A DAY 180 tablet 1    ? polyethylene glycol 3350 (MIRALAX) 17 g packet Take one packet by mouth twice daily.      ? promethazine (PHENERGAN) 25 mg tablet Take 25 mg by mouth every 4 hours as needed. Just uses it with Tramadol      ? rifAXIMin (XIFAXAN) 550 mg tablet Take one tablet by mouth twice daily. 180 tablet 3    ? traMADoL (ULTRAM) 50 mg tablet Take 50 mg by mouth every 8 hours as needed for Pain.      ? zinc sulfate 220 mg (50 mg elemental zinc) capsule Take one capsule by mouth daily. 90 capsule 3      Allergies   Allergen Reactions   ? Tegaderm BLISTERS   ? Adhesive Tape (Rosins) RASH     Other reaction(s): irritated skin   ? Sulfa (Sulfonamide Antibiotics) RASH     Other reaction(s): Unknown Reaction   ? Codeine NAUSEA AND VOMITING   ? Morphine SEE COMMENTS     Pt reports burns her veins  Other reaction(s): Unknown Reaction   ? Penicillin G SEE COMMENTS     Throat issues, blisters in throat    ? Tramadol NAUSEA ONLY and ITCHING     Takes this medication regularly        Review of Systems  A comprehensive review of systems was negative.    Physical Exam:  Vital Signs: Last Filed In 24 Hours Vital Signs: 24 Hour Range   BP: 113/52 (07/15 1424)  Temp: 36.8 ?C (98.3 ?F) (07/15 1424)  Pulse: 91 (07/15 1424)  Respirations: 18 PER MINUTE (07/15 1424)  SpO2: 100 % (07/15 1424) BP: (100-120)/(34-52)   Temp:  [36.7 ?C (98.1 ?F)-36.9 ?C (98.4 ?F)]   Pulse:  [91-99]   Respirations:  [18 PER MINUTE-20 PER MINUTE]   SpO2:  [99 %-100 %]           General appearance: alert and no distress noted.  Neurologic: Grossly normal.  Lungs: Non labored.  Heart: regular rate and rhythm  Abdomen: distended    Pre-procedure anxiolysis plan: N/A  Sedation/Medication Plan: Local anesthetic  Personal history of sedation complications: Denies adverse event.   Family history of sedation complications: Denies adverse event.   Medications for Reversal: NA  Discussion/Reviews:  Physician has discussed risks and alternatives of this type of sedation and above planned procedures with patient    NPO Status: NA  Airway:  NA  Head and Neck: NA  Mouth: NA   Anesthesia Classification:  ASA III (A patient with a severe systemic disease that limits activity, but is not incapacitating)  Pregnancy Status: Not Pregnant    Lab/Radiology/Other Diagnostic Tests:  Labs:    Hematology:    Lab Results   Component Value Date    HGB 7.1 05/31/2020  HCT 22.9 05/31/2020    PLTCT 239 05/31/2020    WBC 7.5 05/31/2020    NEUT 79 05/31/2020    ANC 5.98 05/31/2020    LYMPH 9.3 02/23/2020    ALC 0.58 05/31/2020    MONA 10 05/31/2020    AMC 0.71 05/31/2020    ABC 0.07 05/31/2020    BASOPHILS 1.2 02/23/2020    MCV 81.6 05/31/2020    MCHC 31.0 05/31/2020    MPV 8.5 05/31/2020    RDW 23.1 05/31/2020   , Coagulation:    Lab Results   Component Value Date    PT 15.8 02/23/2020    PTT 32.8 02/25/2020    INR 1.4 05/31/2020    and General Chemistry:    Lab Results   Component Value Date    NA 131 05/31/2020    K 4.9 05/31/2020    CL 103 05/31/2020    GAP 6 05/31/2020    BUN 35 05/31/2020    CR 1.12 05/31/2020    GLU 143 05/31/2020    GLU 172 02/23/2020    CA 8.7 05/31/2020    ALBUMIN 3.1 05/31/2020    LACTIC 1.3 02/11/2020    MG 2.4 08/14/2019    TOTBILI 1.6 05/31/2020              Philmore Pali, APRN-NP  Pager [phone]

## 2020-06-03 NOTE — Patient Instructions
PARACENTESIS   A paracentesis is the removal of an abnormal buildup of fluid in your abdominal cavity. This fluid buildup is called ascites and may be caused by conditions such as liver disease, heart failure, or cancer. During this procedure, a needle is inserted into your abdomen to drain the fluid. The fluid may then be sent to the lab for testing if medically indicated. Removal of the fluid may also relieve belly pressure and shortness of breath caused by the ascites.?  POST-PROCEDURE PAIN:   ? Pain control following your procedure is a priority for both you and your Physicians.  ? Some soreness or tenderness at the site is to be expected for several days. We recommend taking over the counter analgesics to help relieve this pain.  ? Alternative methods for pain relief include but not limited to heat or cold compress, relaxation techniques, rest, and changing of positions.  ? If pain continues after 5-7 days or you have severe pain not relieved by medication, please contact us as directed below.?  POST-PROCEDURE ACTIVITY:   ? A responsible adult must drive you home.  ? If you receive sedation, narcotic pain medication or anesthesia for the procedure, you should not drive or operate heavy machinery or do anything that requires concentration for at least 24 hours after procedure completion.  ? It is recommended that a responsible adult be with you until morning.?  POST-PROCEDURE SITE CARE:   ? You will have a small bandage over the procedure site. Keep this dry.  ? You may remove it in 24 hours.  ? You may shower in 24 hours, after removing the bandage.  ? A dry gauze bandage may be reapplied as necessary to protect your clothing as the site may sometimes leak for several days after the procedure.  ? Do not submerge the procedure site for 1 week (no bathtub, swimming, hot tub, etc.)  ? Do not use ointments, creams or powders on the puncture site.  ? Be sure your hands are clean when touching near the site.? DIET/MEDICATIONS:   ? You may resume your previous diet after the procedure.  ? If you receive sedation or narcotic pain medications, avoid any foods or beverages containing alcohol for at least 24 hours after the procedure.  ? Please see the Medication Reconciliation sheet for instructions regarding resuming your home medications.?  CALL THE DOCTOR IF:   ? Bright red blood soaks the bandage.  ? You have pain not relieved by medication. Some soreness at the site is to be expected.  ? You have signs of infection such as: fever greater than 101F, chills, redness, warmth, swelling, drainage or pus from the puncture site.  For any of the above symptoms or for problems or concerns related to the procedure performed at the [place] Location, call [phone] Monday-Friday from 7-5p. After-hours and weekends, please call [phone] and ask for the Interventional Radiology Resident on-call.   You or your caregiver should call 911 for any severe symptoms such as excessive bleeding, severe dizziness, trouble breathing or loss of consciousness.   ?  ?

## 2020-06-03 NOTE — Progress Notes
Procedural education done with pt who verbalized understanding of procedure. All questions answered. Pt in position of comfort at this time. Safety precautions in place. Will continue to monitor.

## 2020-06-04 ENCOUNTER — Encounter: Admit: 2020-06-04 | Discharge: 2020-06-04 | Payer: MEDICARE

## 2020-06-04 ENCOUNTER — Emergency Department: Admit: 2020-06-04 | Discharge: 2020-06-04 | Payer: MEDICARE

## 2020-06-04 ENCOUNTER — Inpatient Hospital Stay: Admit: 2020-06-04 | Payer: MEDICARE

## 2020-06-04 DIAGNOSIS — K760 Fatty (change of) liver, not elsewhere classified: Secondary | ICD-10-CM

## 2020-06-04 DIAGNOSIS — I729 Aneurysm of unspecified site: Secondary | ICD-10-CM

## 2020-06-04 DIAGNOSIS — R945 Abnormal results of liver function studies: Secondary | ICD-10-CM

## 2020-06-04 DIAGNOSIS — D649 Anemia, unspecified: Secondary | ICD-10-CM

## 2020-06-04 DIAGNOSIS — R0600 Dyspnea, unspecified: Secondary | ICD-10-CM

## 2020-06-04 DIAGNOSIS — E669 Obesity, unspecified: Secondary | ICD-10-CM

## 2020-06-04 DIAGNOSIS — E119 Type 2 diabetes mellitus without complications: Secondary | ICD-10-CM

## 2020-06-04 DIAGNOSIS — K7460 Unspecified cirrhosis of liver: Secondary | ICD-10-CM

## 2020-06-04 DIAGNOSIS — J329 Chronic sinusitis, unspecified: Secondary | ICD-10-CM

## 2020-06-04 DIAGNOSIS — I10 Essential (primary) hypertension: Secondary | ICD-10-CM

## 2020-06-04 DIAGNOSIS — K5730 Diverticulosis of large intestine without perforation or abscess without bleeding: Secondary | ICD-10-CM

## 2020-06-04 DIAGNOSIS — Z01810 Encounter for preprocedural cardiovascular examination: Secondary | ICD-10-CM

## 2020-06-04 MED ORDER — CEFTRIAXONE INJ 1GM IVP
1 g | INTRAVENOUS | 0 refills | Status: DC
Start: 2020-06-04 — End: 2020-06-07
  Administered 2020-06-05 – 2020-06-06 (×3): 1 g via INTRAVENOUS

## 2020-06-04 MED ORDER — DULOXETINE 60 MG PO CPDR
60 mg | Freq: Every day | ORAL | 0 refills | Status: DC
Start: 2020-06-04 — End: 2020-06-07
  Administered 2020-06-05 – 2020-06-07 (×3): 60 mg via ORAL

## 2020-06-04 MED ORDER — ZINC SULFATE 50 MG ZINC (220 MG) PO CAP
220 mg | Freq: Every day | ORAL | 0 refills | Status: DC
Start: 2020-06-04 — End: 2020-06-07
  Administered 2020-06-05 – 2020-06-07 (×3): 220 mg via ORAL

## 2020-06-04 MED ORDER — FUROSEMIDE 10 MG/ML IJ SOLN
40 mg | Freq: Once | INTRAVENOUS | 0 refills | Status: CP
Start: 2020-06-04 — End: ?
  Administered 2020-06-05: 02:00:00 40 mg via INTRAVENOUS

## 2020-06-04 MED ORDER — PANTOPRAZOLE 40 MG PO TBEC
40 mg | Freq: Two times a day (BID) | ORAL | 0 refills | Status: DC
Start: 2020-06-04 — End: 2020-06-04

## 2020-06-04 MED ORDER — LACTATED RINGERS IV SOLP
500 mL | Freq: Once | INTRAVENOUS | 0 refills | Status: CP
Start: 2020-06-04 — End: ?
  Administered 2020-06-04: 15:00:00 500 mL via INTRAVENOUS

## 2020-06-04 MED ORDER — FENTANYL CITRATE (PF) 50 MCG/ML IJ SOLN
50 ug | Freq: Once | INTRAVENOUS | 0 refills | Status: CP
Start: 2020-06-04 — End: ?
  Administered 2020-06-04: 15:00:00 50 ug via INTRAVENOUS

## 2020-06-04 MED ORDER — OCTREOTIDE ACETATE 200 MCG/ML IJ SOLN
100 ug | Freq: Three times a day (TID) | SUBCUTANEOUS | 0 refills | Status: DC
Start: 2020-06-04 — End: 2020-06-05
  Administered 2020-06-05 (×2): 100 ug via SUBCUTANEOUS

## 2020-06-04 MED ORDER — POLYETHYLENE GLYCOL 3350 17 GRAM PO PWPK
17 g | Freq: Two times a day (BID) | ORAL | 0 refills | Status: DC
Start: 2020-06-04 — End: 2020-06-07
  Administered 2020-06-05 – 2020-06-07 (×4): 17 g via ORAL

## 2020-06-04 MED ORDER — IOHEXOL 350 MG IODINE/ML IV SOLN
80 mL | Freq: Once | INTRAVENOUS | 0 refills | Status: CP
Start: 2020-06-04 — End: ?
  Administered 2020-06-04: 16:00:00 80 mL via INTRAVENOUS

## 2020-06-04 MED ORDER — SODIUM CHLORIDE 0.9 % IJ SOLN
50 mL | Freq: Once | INTRAVENOUS | 0 refills | Status: CP
Start: 2020-06-04 — End: ?
  Administered 2020-06-04: 16:00:00 50 mL via INTRAVENOUS

## 2020-06-04 MED ORDER — CIPROFLOXACIN HCL 500 MG PO TAB
500 mg | Freq: Every day | ORAL | 0 refills | Status: DC
Start: 2020-06-04 — End: 2020-06-04

## 2020-06-04 MED ORDER — RIFAXIMIN 550 MG PO TAB
550 mg | Freq: Two times a day (BID) | ORAL | 0 refills | Status: DC
Start: 2020-06-04 — End: 2020-06-07
  Administered 2020-06-05 – 2020-06-07 (×6): 550 mg via ORAL

## 2020-06-04 MED ORDER — MIDODRINE 5 MG PO TAB
10 mg | Freq: Three times a day (TID) | ORAL | 0 refills | Status: DC
Start: 2020-06-04 — End: 2020-06-07
  Administered 2020-06-05 – 2020-06-07 (×8): 10 mg via ORAL

## 2020-06-04 MED ORDER — PANTOPRAZOLE 40 MG IV SOLR
40 mg | Freq: Two times a day (BID) | INTRAVENOUS | 0 refills | Status: DC
Start: 2020-06-04 — End: 2020-06-05
  Administered 2020-06-04 – 2020-06-05 (×2): 40 mg via INTRAVENOUS

## 2020-06-04 MED ORDER — TRAMADOL 50 MG PO TAB
50 mg | ORAL | 0 refills | Status: DC | PRN
Start: 2020-06-04 — End: 2020-06-04

## 2020-06-04 MED ORDER — OXYCODONE 5 MG PO TAB
5 mg | ORAL | 0 refills | Status: DC | PRN
Start: 2020-06-04 — End: 2020-06-07
  Administered 2020-06-05 – 2020-06-07 (×8): 5 mg via ORAL

## 2020-06-04 NOTE — Consults
Hepatology Consult Note  Patient Name:Kristin Moody         ZOX:[id number]  Admission Date: 06/04/2020  7:20 AM      Principal Problem:    Pleurodynia  Active Problems:    Fatty liver disease, nonalcoholic    Essential hypertension    Cirrhosis of liver with ascites (HCC)    Acute on chronic blood loss anemia    Portal hypertension (HCC)    Anemia    Pneumothorax      History of Present Illness/Subjective:  Kristin Moody is a 64 y.o. 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  She was also noted to have anemia for which GI is consulted.  Patient receives paracentesis weekly due to refractory ascites.  After her paracentesis on 7/16 she started to develop right-sided pleuritic chest pain that did not improve overnight prompting her to present to the emergency department.  In the ED she was found to have a small right-sided hydropneumothorax and was started on a non-rebreather.  Labs showed hemoglobin of 6.8 from 7.2.  She does have a history of chronic anemia related to recurrent GI bleeding and has had black tarry stools for years and typically receives a unit of blood about once a week.  She denies any abdominal pain.  She recently underwent EGD on 6/30 where she was found to have small esophageal varices.  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   Regarding her history of liver disease she has decompensated cirrhosis 2/2 NASH with most recent meld score of 19 and is actively listed for transplant.  Her disease has been decompensated by varices and undergoes routine screening.  She also has refractory ascites requiring paracentesis weekly.  She has had recurrent AKI and diuretics and diuretics were discontinued in April. She is not a TIPS candidate.  She also has hepatic encephalopathy and is on Xifaxan and MiraLAX twice daily with goal of 3-4 bowel movements daily.        Assessment/ Plan:  #Chronic anemia 2/2 recurrent GI bleeding 2/2 GAVE/portal hypertensive gastropathy  Presents with hemoglobin 6.8 from 7.2.  Hemoglobin has been around 6-8 since 2019.  History of chronic anemia due to recurrent GI bleeding.  Receives weekly blood transfusions.  Patient has had chronic melena for years.  Recent EGD on 6/30 with bleeding GAVE successfully treated with APC.  Currently hemodynamically stable    #Acute pleuritic chest pain 2/2 right sided pneumothorax  Developed acute right-sided pleuritic chest pain after paracentesis on 7/16.  CT chest shows small right-sided pneumothorax and was started on nonrebreather.    #Decompensated Cirrhosis 2/2 NASH   MELD-Na score: 19 at 06/04/2020  8:07 AM  MELD score: 14 at 06/04/2020  8:07 AM  Calculated from:  Serum Creatinine: 1.25 MG/DL at 0/98/1191  4:78 AM  Serum Sodium: 131 MMOL/L at 06/04/2020  8:07 AM  Total Bilirubin: 1.5 MG/DL at 2/95/6213  0:86 AM  INR(ratio): 1.4 at 06/03/2020  9:53 AM  Age: 64 years 8 months  Actively listed for transplant.  Liver chemistries stable.  INR unchanged at 1.4.    #Esophageal Varices  Known small esophageal varices  on recent EGD    #Fluid management  History of refractory ascites requiring weekly paracentesis.  Not a TIPS candidate.  Most recent paracentesis does not show evidence of SBP.  Had previously been on Cipro for SBP prophylaxis, but was discontinued due to concerns for neuropathy.  Diuretics held in April due to recurrent AKI CT abdomen pelvis with contrast today does show moderate ascites.    #Hepatic Encephalopathy  Now well controlled on rifaximin twice daily and MiraLAX twice daily with goal of 3-4 bowel movements daily Recommendations:  1.  As patient's anemia and melena are chronic, is currently hemodynamically stable and underwent recent EGD no plans for endoscopy at this time  2.  Agree with 1 unit PRBCs  3.  PTX treatment per primary, treating with nonrebreather with plan for repeat CXR in a.m.  4.  Patient does not appear to be having acute bleeding, do not feel strongly about continuing IV PPI or octreotide  5.  Continue rifaximin and MiraLAX twice daily  6.  Can continue IV Rocephin, but would hold off on continuing Cipro, per recent Hepatology clinic note was discontinued due to concern for neuropathy    Patient seen/discussed with Dr. Zada Finders, DO  Gastroenterology and Hepatology Fellow  06/04/2020 3:31 PM       -----------------------------  PMH:  Medical History:   Diagnosis Date   ? Aneurysm (HCC)    ? 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 Diverticulosis of colon     descending and sigmoid colon   ? Dyspnea    ? Essential hypertension 08/13/2012   ? Fatty infiltration of liver    ? HTN (hypertension)    ? Obesity (BMI 30-39.9) 05/13/2011   ? Preop cardiovascular exam 01/07/2019   ? Sinus infection    ? Type 2 diabetes mellitus (HCC) 09/21/2014       Current medications:  No current facility-administered medications on file prior to encounter.     Current Outpatient Medications on File Prior to Encounter   Medication Sig Dispense Refill   ? cetirizine (ZYRTEC) 10 mg tablet Take 10 mg by mouth daily as needed for Allergy symptoms.     ? ciprofloxacin (CIPRO) 500 mg tablet Take one tablet by mouth daily. 30 tablet 1   ? duloxetine DR (CYMBALTA) 60 mg capsule Take 60 mg by mouth daily.     ? midodrine (PROAMITINE) 10 mg tablet Take one tablet by mouth three times daily. 90 tablet 1   ? pantoprazole DR (PROTONIX) 40 mg tablet TAKE 1 TABLET BY MOUTH TWICE A DAY 180 tablet 1   ? polyethylene glycol 3350 (MIRALAX) 17 g packet Take one packet by mouth twice daily.     ? promethazine (PHENERGAN) 25 mg tablet Take 25 mg by mouth every 4 hours as needed. Just uses it with Tramadol     ? rifAXIMin (XIFAXAN) 550 mg tablet Take one tablet by mouth twice daily. 180 tablet 3   ? traMADoL (ULTRAM) 50 mg tablet Take 50 mg by mouth every 8 hours as needed for Pain.     ? zinc sulfate 220 mg (50 mg elemental zinc) capsule Take one capsule by mouth daily. 90 capsule 3       PSH:  Surgical History:   Procedure Laterality Date   ? HYSTERECTOMY  1994   ? UPPER GASTROINTESTINAL ENDOSCOPY  2009   ? COLONOSCOPY  2009   ? CYSTOCELE  REPAIR  2009    with endocele repair   ? RECTOCELE REPAIR  03/2009   ? LIVER BIOPSY  07/17/2010   ? COLONOSCOPY N/A 11/08/2017    Performed by Dawna Part, MD at [facility] ENDO   ? ESOPHAGOGASTRODUODENOSCOPY N/A 11/08/2017    Performed by Dawna Part, MD at [facility] ENDO   ? ESOPHAGOGASTRODUODENOSCOPY WITH BIOPSY - FLEXIBLE  11/08/2017    Performed by Dawna Part, MD at [facility] ENDO   ? COLONOSCOPY WITH HOT BIOPSY FORCEPS REMOVAL TUMOR/ POLYP/ OTHER LESION  11/08/2017    Performed by Dawna Part, MD at [facility] ENDO   ? ESOPHAGOGASTRODUODENOSCOPY WITH BAND LIGATION ESOPHAGEAL/ GASTRIC VARICES - FLEXIBLE N/A 04/10/2018    Performed by Normajean Baxter, MD at [facility] ENDO   ? ESOPHAGOGASTRODUODENOSCOPY WITH BIOPSY - FLEXIBLE N/A 04/10/2018    Performed by Normajean Baxter, MD at [facility] ENDO   ? ESOPHAGOGASTRODUODENOSCOPY WITH CONTROL OF BLEEDING - FLEXIBLE N/A 04/25/2018    Performed by Jolee Ewing, MD at [facility] ENDO   ? ESOPHAGOGASTRODUODENOSCOPY WITH BIOPSY - FLEXIBLE with push enteroscopy N/A 08/28/2018    Performed by Celesta Gentile, MD at [facility] ENDO   ? EGD N/A 10/27/2018    Performed by Eliott Nine, MD at [facility] ENDO   ? ESOPHAGOGASTRODUODENOSCOPY WITH SNARE REMOVAL TUMOR/ POLYP/ OTHER LESION - FLEXIBLE N/A 10/27/2018    Performed by Eliott Nine, MD at [facility] ENDO   ? ESOPHAGOGASTRODUODENOSCOPY WITH BIOPSY - FLEXIBLE N/A 12/16/2018    Performed by Buckles, Vinnie Level, MD at [facility] ENDO   ? ESOPHAGOGASTRODUODENOSCOPY WITH CONTROL OF BLEEDING - FLEXIBLE N/A 12/16/2018    Performed by Buckles, Vinnie Level, MD at [facility] ENDO   ? ESOPHAGOGASTRODUODENOSCOPY WITH SPECIMEN COLLECTION BY BRUSHING/ WASHING N/A 01/22/2019    Performed by Veneta Penton, MD at [facility] ENDO   ? ESOPHAGOGASTRODUODENOSCOPY WITH DILATION ESOPHAGUS WITH BALLOON 30 MM OR GREATER - FLEXIBLE N/A 01/22/2019    Performed by Veneta Penton, MD at [facility] ENDO   ? ESOPHAGOGASTRODUODENOSCOPY WITH BIOPSY - FLEXIBLE N/A 01/22/2019    Performed by Veneta Penton, MD at [facility] ENDO   ? ESOPHAGOGASTRODUODENOSCOPY WITH BIOPSY - FLEXIBLE N/A 06/19/2019    Performed by Dawna Part, MD at [facility] ENDO   ? ESOPHAGOGASTRODUODENOSCOPY [WITH APC]  WITH SPECIMEN COLLECTION BY BRUSHING/ WASHING N/A 08/07/2019    Performed by Dawna Part, MD at [facility] ENDO   ? ESOPHAGOGASTRODUODENOSCOPY WITH SPECIMEN COLLECTION BY BRUSHING/ WASHING N/A 09/10/2019    Performed by Buckles, Vinnie Level, MD at [facility] ENDO   ? ESOPHAGOGASTRODUODENOSCOPY WITH CONTROL OF BLEEDING - FLEXIBLE N/A 09/10/2019    Performed by Buckles, Vinnie Level, MD at [facility] ENDO   ? ESOPHAGOGASTRODUODENOSCOPY WITH CONTROL OF BLEEDING - FLEXIBLE N/A 02/12/2020    Performed by Lenor Derrick, MD at [facility] ENDO   ? ESOPHAGOGASTRODUODENOSCOPY WITH BIOPSY - FLEXIBLE N/A 02/26/2020    Performed by Buckles, Vinnie Level, MD at [facility] ENDO   ? ESOPHAGOGASTRODUODENOSCOPY WITH BIOPSY - FLEXIBLE N/A 05/18/2020    Performed by Lenor Derrick, MD at [facility] ENDO   ? ESOPHAGOGASTRODUODENOSCOPY WITH CONTROL OF BLEEDING - FLEXIBLE N/A 05/18/2020    Performed by Lenor Derrick, MD at [facility] ENDO   ? CHOLECYSTECTOMY     ? COLONOSCOPY     ? ESOPHAGOGASTRIC FUNDOPLICATION  2003, 2004    laparoscopic   ? ESOPHAGOGASTRIC FUNDOPLICATION     ? HX HYSTERECTOMY     ? PR ESOPHAGOSCOPY FLEXIBLE TRANSORAL DIAGNOSTIC  SH:  Social History     Socioeconomic History   ? Marital status: Married     Spouse name: [name]   ? Number of children: 1   ? Years of education: Not on file   ? Highest education level: Not on file Occupational History   ? Occupation: Geographical information systems officer: EMPLOYED   Tobacco Use   ? Smoking status: Never Smoker   ? Smokeless tobacco: Never Used   Substance and Sexual Activity   ? Alcohol use: No   ? Drug use: No   ? Sexual activity: Yes     Partners: Male     Birth control/protection: Other   Other Topics Concern   ? Not on file   Social History Narrative    Negative  For tobacco, alcohol, or illicit drug use.  No high risk sexual behavior or tattoo placement and no transfusion prior to 1992.  Married, one child healthy.  Works in Radio broadcast assistant.    Works in [place]        FH:  Family History   Problem Relation Age of Onset   ? Other Mother    ? Kidney Failure Mother    ? Osteoporosis Mother    ? Cancer Father         esophageal   ? Liver Disease Sister         endstage, s/p liver transplant   ? Cancer Brother         tonsil, liver    ? Hip Fracture Neg Hx        Review of Systems:  Constitutional: No fevers, chills, weight loss  Eyes: No vision deficit, no icterus  Ears, nose, mouth: No oral bleeding, ulcer  Cardiovascular: + chest pain, no palpitations  Respiratory: No dyspnea, cough   Gastrointestinal: No abdominal pain, + chronic black stools  Musculoskeltal: No joint inflammation, new deformity  Integumentary: No rashes, exudate  Neurologic: No cognitive change, focal weakness  Hematologic: No for easy bleeding or bruising  Please see HPI for additional pertinent documentation    Physical Exam:  Vitals:    06/04/20 1135 06/04/20 1250 06/04/20 1408 06/04/20 1509   BP:  111/60 107/53 101/46   BP Source:       Pulse:  108 103 102   Temp:  36.6 ?C (97.9 ?F) 36.9 ?C (98.4 ?F) 36.9 ?C (98.4 ?F)   SpO2: 100% 100% 100% 100%   Weight:         Constitutional- Vitals above, no acute distress.   Head - Normocephalic, atraumatic.   Eyes -  EOMI grossly. No icterus or injection.   Ears, nose, mouth, throat- No oral ulcer or bleeding.   Neck - No swelling or tracheal deviation.   Respiratory - Symmetric chest rise, no increased work of breathing, diminished right-sided breath sounds.  Nonrebreather in place  Cardiovascular - Peripheral pulses intact, no pedal edema.  Gastrointestinal-soft, Non-TTP, moderate distention with fluid wave.  Mild hepatosplenomegaly appreciated.  Skin - No exposed rash, lesion.  Neurologic - No gross CN deficit, normal fluid speech.  No asterixis present.  Psychiatric - Judgement intact, thought content appropriate.    Labs/Imaging:  Pertient labs/imaging were reviewed on initiation of progress note.

## 2020-06-04 NOTE — ED Notes
Patient placed on nonrebreather, 15L per resident physician to assist with comfort and ease of breathing. Patient and patient's niece updated that patient has a pneumothorax and the physician will be in to discuss this furhter with them. RT also notified of patient going on nonrebreather for comfort.

## 2020-06-04 NOTE — Progress Notes
MD notified - Pt.admitted to 43  ER nurse , Florentina Addison gave report earlier before pt. Arrived.

## 2020-06-04 NOTE — ED Notes
Patient continues to report pain 9/10 and verbalizes that the Fentanyl did not help her pain and when she went to the CT scanner and had to transfer from the ED cart to CT scanner this only increased her pain. Discussed with patient asking her provider for pain medication and patient was agreeable to this.

## 2020-06-04 NOTE — ED Notes
Patient presents to [facility] ED with reports of chest pain. Patient verbalizes that her chest pain started a couple days ago. Patient reports that every Friday she comes to  to get a paracentesis performed, pt did have a paracentesis yesterday and reports that they took around 7.4L off yesterday. Patient reports she has a hx of fatty liver/ liver failure. Patient reports during her paracentesis yesterday she had chest pain; however it was relieved after the fluid was taken off but then it started up again. Patient's niece reports the patient took her home dose of Tramadol around 1600 and midnight, which did not help therefore she came into the ED.     Patient reports chest pain radiates to her right arm and reports numbness and tingling but states nothing less than normal. Niece reports the patient had some dizziness yesterday but has not fallen since she broke her wrist about 6 months ago. Patient reports when asked that her chest pain does radiate up into her jaw a little bit and down her throat. Patient reports that she is short of breath with ambultion and has intermitted shortness of breath when at rest. Patient denies nausea or vomiting. Reports also pain into her neck and down to her hip. Patient reports her current chest pain at a 9/10; neck and back pain pt reports a 9/10.    Medical History:   Diagnosis Date   ? Aneurysm (HCC)    ? 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 Diverticulosis of colon     descending and sigmoid colon   ? Dyspnea    ? Essential hypertension 08/13/2012   ? Fatty infiltration of liver    ? HTN (hypertension)    ? Obesity (BMI 30-39.9) 05/13/2011   ? Preop cardiovascular exam 01/07/2019   ? Sinus infection    ? Type 2 diabetes mellitus (HCC) 09/21/2014     BELONGINGS: Patient currently wearing her glasses, black shoes, white socks, black pants and verbalizes she is wearing a full set of dentures.

## 2020-06-04 NOTE — H&P (View-Only)
Admission History and Physical Assessment         Name:  Kristin Moody                                             MRN:  [id number]   Admission Date:  06/04/2020  Principal Problem:    Pleurodynia  Active Problems:    Fatty liver disease, nonalcoholic    Essential hypertension    Cirrhosis of liver with ascites (HCC)    Acute on chronic blood loss anemia    Portal hypertension (HCC)    Anemia    Pneumothorax                       Assessment and Plan       64 y.o. female with past medical history of liver cirrhosis, hypertension, diabetes mellitus type 2, obesity, aortic aneurysm who presented to the hospital with acute onset right-sided chest pain.     Chest pain:  Pneumothorax:   - CTA chest: No pulmonary embolism. Small right hydropneumothorax. Low lung volumes with mild basilar opacities, likely atelectasis.   - chest pain started post paracentesis on 06/03/20.   - EKG sinus, no stemi. Rate 100. Trop neg.   - continue with non-rebreather for now. Repeat X ray in AM.   - IV fentanyl prn for pain. (avoiding tramadol as inadequate and on cipro with QTc of 492).     Acute on chronic blood loss anemia:   - hx of   - last egd 05/18/20: Grade I and small (< 5 mm) esophageal varices. Gastric antral vascular ectasia with bleeding. Treated with argon plasma coagulation (APC). A single 25 mm semi-sessile ulcerated polyp with no bleeding was found in the gastric antrum. Multiple gastric polyps. Erythematous duodenopathy.  Plan:   - s/p 1 u of PRBC in the ED.   - hepatology consult.   - IV PPI.   - IV ceftriaxone, Octreotide.       ESLD:   - 2/2 to NASH cirrhosis.   - MELD-Na score: 19 at 06/04/2020  8:07 AM  MELD score: 14 at 06/04/2020  8:07 AM  Calculated from:  Serum Creatinine: 1.25 MG/DL at 11/27/3233  5:73 AM  Serum Sodium: 131 MMOL/L at 06/04/2020  8:07 AM  Total Bilirubin: 1.5 MG/DL at 01/08/2541  7:06 AM  INR(ratio): 1.4 at 06/03/2020  9:53 AM  Age: 62 years 8 months   - CT abd/pelvis: No focal inflammatory process or bowel obstruction. Cirrhosis and portal hypertension with moderate ascites and varices. Known polyp of the gastric antrum which is further described on recent upper endoscopy.   Plan:   - last paracentesis was 06/03/20 fluid not consistent with SBP.   - continue PTA cipro ppx.  - Midodirne 10 mg TID.   - Zinc.   - Miralax BID.   - monitor for HE.   Serial cbc.    Elevated D dimer:   - D dimer was 4463.   - CTA chest: no acute PE.    Hx of DM:   - A1c inaccurate.e   - monitor glucose.         FEN:  npo    Prophylaxis Review:  Lines:  p iv  Urinary Catheter:  none  VTE: scd    Code status: full code.     Disposition: admit  to med.    Murlean Caller, MD  Pager [phone]        ___________________________________________________________________________  Primary Care Physician: Concha Norway      Chief Complaint: Chest pain    History of Present Illness:     Kristin Moody is a 64 y.o. female with past medical history of liver cirrhosis, hypertension, diabetes mellitus type 2, obesity, aortic aneurysm who presented to the hospital with acute onset right-sided chest pain.  Patient reports symptoms started approximately few minutes after she had paracentesis performed by interventional radiology on 06/03/2020 patient reports that the symptoms have been progressive, right-sided chest pain is worse with deep inspiration.  She reports being uncomfortable all night last night and the pain did not improve so she presented to the emergency department.  In the emergency department she was noted to have right-sided pneumothorax and was placed on nonrebreather oxygen.  Patient reports ongoing black tarry stools which has been acute on chronic for her.  She was also noted to be anemic in the emergency department and was started on blood transfusion.  Patient denies any hematemesis, nausea.  She denies any dizziness or lightheadedness.  Patient reports taking medications as prescribed.      Past Medical History:  Medical History:   Diagnosis Date   ? Aneurysm (HCC)    ? 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 Diverticulosis of colon     descending and sigmoid colon   ? Dyspnea    ? Essential hypertension 08/13/2012   ? Fatty infiltration of liver    ? HTN (hypertension)    ? Obesity (BMI 30-39.9) 05/13/2011   ? Preop cardiovascular exam 01/07/2019   ? Sinus infection    ? Type 2 diabetes mellitus (HCC) 09/21/2014          Past Surgical History:  Surgical History:   Procedure Laterality Date   ? HYSTERECTOMY  1994   ? UPPER GASTROINTESTINAL ENDOSCOPY  2009   ? COLONOSCOPY  2009   ? CYSTOCELE REPAIR  2009    with endocele repair   ? RECTOCELE REPAIR  03/2009   ? LIVER BIOPSY  07/17/2010   ? COLONOSCOPY N/A 11/08/2017    Performed by Dawna Part, MD at [facility] ENDO   ? ESOPHAGOGASTRODUODENOSCOPY N/A 11/08/2017    Performed by Dawna Part, MD at [facility] ENDO   ? ESOPHAGOGASTRODUODENOSCOPY WITH BIOPSY - FLEXIBLE  11/08/2017    Performed by Dawna Part, MD at [facility] ENDO   ? COLONOSCOPY WITH HOT BIOPSY FORCEPS REMOVAL TUMOR/ POLYP/ OTHER LESION  11/08/2017    Performed by Dawna Part, MD at [facility] ENDO   ? ESOPHAGOGASTRODUODENOSCOPY WITH BAND LIGATION ESOPHAGEAL/ GASTRIC VARICES - FLEXIBLE N/A 04/10/2018    Performed by Normajean Baxter, MD at [facility] ENDO   ? ESOPHAGOGASTRODUODENOSCOPY WITH BIOPSY - FLEXIBLE N/A 04/10/2018    Performed by Normajean Baxter, MD at [facility] ENDO   ? ESOPHAGOGASTRODUODENOSCOPY WITH CONTROL OF BLEEDING - FLEXIBLE N/A 04/25/2018    Performed by Jolee Ewing, MD at [facility] ENDO   ? ESOPHAGOGASTRODUODENOSCOPY WITH BIOPSY - FLEXIBLE with push enteroscopy N/A 08/28/2018    Performed by Celesta Gentile, MD at [facility] ENDO   ? EGD N/A 10/27/2018    Performed by Eliott Nine, MD at [facility] ENDO   ? ESOPHAGOGASTRODUODENOSCOPY WITH SNARE REMOVAL TUMOR/ POLYP/ OTHER LESION - FLEXIBLE N/A 10/27/2018    Performed by Eliott Nine, MD at [facility] ENDO   ?  ESOPHAGOGASTRODUODENOSCOPY WITH BIOPSY - FLEXIBLE N/A 12/16/2018 Performed by Buckles, Vinnie Level, MD at [facility] ENDO   ? ESOPHAGOGASTRODUODENOSCOPY WITH CONTROL OF BLEEDING - FLEXIBLE N/A 12/16/2018    Performed by Buckles, Vinnie Level, MD at [facility] ENDO   ? ESOPHAGOGASTRODUODENOSCOPY WITH SPECIMEN COLLECTION BY BRUSHING/ WASHING N/A 01/22/2019    Performed by Veneta Penton, MD at [facility] ENDO   ? ESOPHAGOGASTRODUODENOSCOPY WITH DILATION ESOPHAGUS WITH BALLOON 30 MM OR GREATER - FLEXIBLE N/A 01/22/2019    Performed by Veneta Penton, MD at [facility] ENDO   ? ESOPHAGOGASTRODUODENOSCOPY WITH BIOPSY - FLEXIBLE N/A 01/22/2019    Performed by Veneta Penton, MD at [facility] ENDO   ? ESOPHAGOGASTRODUODENOSCOPY WITH BIOPSY - FLEXIBLE N/A 06/19/2019    Performed by Dawna Part, MD at [facility] ENDO   ? ESOPHAGOGASTRODUODENOSCOPY [WITH APC]  WITH SPECIMEN COLLECTION BY BRUSHING/ WASHING N/A 08/07/2019    Performed by Dawna Part, MD at [facility] ENDO   ? ESOPHAGOGASTRODUODENOSCOPY WITH SPECIMEN COLLECTION BY BRUSHING/ WASHING N/A 09/10/2019    Performed by Buckles, Vinnie Level, MD at [facility] ENDO   ? ESOPHAGOGASTRODUODENOSCOPY WITH CONTROL OF BLEEDING - FLEXIBLE N/A 09/10/2019    Performed by Buckles, Vinnie Level, MD at [facility] ENDO   ? ESOPHAGOGASTRODUODENOSCOPY WITH CONTROL OF BLEEDING - FLEXIBLE N/A 02/12/2020    Performed by Lenor Derrick, MD at [facility] ENDO   ? ESOPHAGOGASTRODUODENOSCOPY WITH BIOPSY - FLEXIBLE N/A 02/26/2020    Performed by Buckles, Vinnie Level, MD at [facility] ENDO   ? ESOPHAGOGASTRODUODENOSCOPY WITH BIOPSY - FLEXIBLE N/A 05/18/2020    Performed by Lenor Derrick, MD at [facility] ENDO   ? ESOPHAGOGASTRODUODENOSCOPY WITH CONTROL OF BLEEDING - FLEXIBLE N/A 05/18/2020    Performed by Lenor Derrick, MD at [facility] ENDO   ? CHOLECYSTECTOMY     ? COLONOSCOPY     ? ESOPHAGOGASTRIC FUNDOPLICATION  2003, 2004    laparoscopic   ? ESOPHAGOGASTRIC FUNDOPLICATION     ? HX HYSTERECTOMY     ? PR ESOPHAGOSCOPY FLEXIBLE TRANSORAL DIAGNOSTIC            Home Medications:  No current facility-administered medications on file prior to encounter.     Current Outpatient Medications on File Prior to Encounter   Medication Sig Dispense Refill   ? cetirizine (ZYRTEC) 10 mg tablet Take 10 mg by mouth daily as needed for Allergy symptoms.     ? ciprofloxacin (CIPRO) 500 mg tablet Take one tablet by mouth daily. 30 tablet 1   ? duloxetine DR (CYMBALTA) 60 mg capsule Take 60 mg by mouth daily.     ? midodrine (PROAMITINE) 10 mg tablet Take one tablet by mouth three times daily. 90 tablet 1   ? pantoprazole DR (PROTONIX) 40 mg tablet TAKE 1 TABLET BY MOUTH TWICE A DAY 180 tablet 1   ? polyethylene glycol 3350 (MIRALAX) 17 g packet Take one packet by mouth twice daily.     ? promethazine (PHENERGAN) 25 mg tablet Take 25 mg by mouth every 4 hours as needed. Just uses it with Tramadol     ? rifAXIMin (XIFAXAN) 550 mg tablet Take one tablet by mouth twice daily. 180 tablet 3   ? traMADoL (ULTRAM) 50 mg tablet Take 50 mg by mouth every 8 hours as needed for Pain.     ? zinc sulfate 220 mg (50 mg elemental zinc) capsule Take one capsule by mouth daily. 90 capsule 3          Allergies:  Tegaderm, Adhesive  tape (rosins), Sulfa (sulfonamide antibiotics), Codeine, Morphine, Penicillin g, and Tramadol      Social History:  Social History     Socioeconomic History   ? Marital status: Married     Spouse name: [name]   ? Number of children: 1   ? Years of education: Not on file   ? Highest education level: Not on file   Occupational History   ? Occupation: Geographical information systems officer: EMPLOYED   Tobacco Use   ? Smoking status: Never Smoker   ? Smokeless tobacco: Never Used   Substance and Sexual Activity   ? Alcohol use: No   ? Drug use: No   ? Sexual activity: Yes     Partners: Male     Birth control/protection: Other   Other Topics Concern   ? Not on file   Social History Narrative    Negative  For tobacco, alcohol, or illicit drug use.  No high risk sexual behavior or tattoo placement and no transfusion prior to 1992.  Married, one child healthy.  Works in Radio broadcast assistant.    Works in [place] Family History:  Family History   Problem Relation Age of Onset   ? Other Mother    ? Kidney Failure Mother    ? Osteoporosis Mother    ? Cancer Father         esophageal   ? Liver Disease Sister         endstage, s/p liver transplant   ? Cancer Brother         tonsil, liver    ? Hip Fracture Neg Hx           Immunizations:   Immunization History   Administered Date(s) Administered   ? Flu Vaccine =>6 Months Quadrivalent PF 08/27/2017, 08/13/2019   ? Hepatitis A vaccine Adult IM 08/13/2013             Review of Systems:    {Review of Systems   Constitutional: Negative for chills, fever, malaise/fatigue and weight loss.   HENT: Negative for congestion, ear discharge, ear pain, hearing loss, nosebleeds, sinus pain and tinnitus.    Eyes: Negative for blurred vision, double vision, photophobia and pain.   Respiratory: Negative for cough, hemoptysis, sputum production and shortness of breath.    Cardiovascular: Positive for chest pain. Negative for palpitations, orthopnea, claudication and leg swelling.   Gastrointestinal: Positive for abdominal pain and nausea. Negative for diarrhea, heartburn and vomiting.   Genitourinary: Negative for dysuria, frequency, hematuria and urgency.   Musculoskeletal: Negative for back pain, myalgias and neck pain.   Skin: Negative for rash.   Neurological: Negative for dizziness, tremors, sensory change, speech change and focal weakness.   Endo/Heme/Allergies: Does not bruise/bleed easily.   Psychiatric/Behavioral: Negative for depression, hallucinations and substance abuse. The patient is not nervous/anxious.         PHYSICAL EXAM:    Vital Signs: Last Filed In 24 Hours   BP: 100/47 (07/17 1600)  Temp: 36.9 ?C (98.4 ?F) (07/17 1509)  Pulse: 99 (07/17 1600)  Respirations: 19 PER MINUTE (07/17 1600)  SpO2: 100 % (07/17 1600)  SpO2 Pulse: 101 (07/17 1600)     {Physical Exam  Constitutional:       General: She is in acute distress.      Appearance: She is ill-appearing.   HENT: Head: Normocephalic.      Right Ear: External ear normal. There is no impacted cerumen.  Left Ear: External ear normal. There is no impacted cerumen.      Nose: Nose normal. No congestion.      Mouth/Throat:      Mouth: Mucous membranes are moist.      Pharynx: No oropharyngeal exudate.   Eyes:      General: No scleral icterus.  Cardiovascular:      Rate and Rhythm: Normal rate.      Pulses: Normal pulses.      Heart sounds: No murmur.   Pulmonary:      Effort: Pulmonary effort is normal. No respiratory distress.      Breath sounds: Normal breath sounds. No stridor.   Abdominal:      General: Abdomen is flat. Bowel sounds are normal. There is no distension.      Palpations: There is no mass.      Tenderness: There is no abdominal tenderness.   Musculoskeletal:         General: No swelling. Normal range of motion.      Cervical back: No rigidity.   Skin:     General: Skin is warm.      Coloration: Skin is not jaundiced.   Neurological:      General: No focal deficit present.      Mental Status: She is alert and oriented to person, place, and time.      Cranial Nerves: No cranial nerve deficit.      Sensory: No sensory deficit.   Psychiatric:         Mood and Affect: Mood normal.          Labs and Data:  24-hour labs:    Results for orders placed or performed during the hospital encounter of 06/04/20 (from the past 24 hour(s))   COMPREHENSIVE METABOLIC PANEL    Collection Time: 06/04/20  8:07 AM   Result Value Ref Range    Sodium 131 (L) 137 - 147 MMOL/L    Potassium 4.6 3.5 - 5.1 MMOL/L    Chloride 101 98 - 110 MMOL/L    Glucose 124 (H) 70 - 100 MG/DL    Blood Urea Nitrogen 39 (H) 7 - 25 MG/DL    Creatinine 1.61 (H) 0.4 - 1.00 MG/DL    Calcium 8.2 (L) 8.5 - 10.6 MG/DL    Total Protein 5.7 (L) 6.0 - 8.0 G/DL    Total Bilirubin 1.5 (H) 0.3 - 1.2 MG/DL    Albumin 3.2 (L) 3.5 - 5.0 G/DL    Alk Phosphatase 096 (H) 25 - 110 U/L    AST (SGOT) 44 (H) 7 - 40 U/L    CO2 21 21 - 30 MMOL/L    ALT (SGPT) 19 7 - 56 U/L Anion Gap 9 3 - 12    eGFR Non African American 43 (L) >60 mL/min    eGFR African American 53 (L) >60 mL/min   CBC AND DIFF    Collection Time: 06/04/20  8:07 AM   Result Value Ref Range    White Blood Cells 11.3 (H) 4.5 - 11.0 K/UL    RBC 2.82 (L) 4.0 - 5.0 M/UL    Hemoglobin 6.8 (L) 12.0 - 15.0 GM/DL    Hematocrit 04.5 (L) 36 - 45 %    MCV 79.2 (L) 80 - 100 FL    MCH 24.2 (L) 26 - 34 PG    MCHC 30.6 (L) 32.0 - 36.0 G/DL    RDW 40.9 (H) 11 - 15 %    Platelet  Count 241 150 - 400 K/UL    MPV 8.7 7 - 11 FL    Neutrophils 78 (H) 41 - 77 %    Lymphocytes 7 (L) 24 - 44 %    Monocytes 13 (H) 4 - 12 %    Eosinophils 1 0 - 5 %    Basophils 1 0 - 2 %    Absolute Neutrophil Count 8.89 (H) 1.8 - 7.0 K/UL    Absolute Lymph Count 0.78 (L) 1.0 - 4.8 K/UL    Absolute Monocyte Count 1.46 (H) 0 - 0.80 K/UL    Absolute Eosinophil Count 0.12 0 - 0.45 K/UL    Absolute Basophil Count 0.10 0 - 0.20 K/UL    MDW (Monocyte Distribution Width) 24.7 (H) <20.7   LIPASE    Collection Time: 06/04/20  8:07 AM   Result Value Ref Range    Lipase 53 11 - 82 U/L   MAGNESIUM    Collection Time: 06/04/20  8:07 AM   Result Value Ref Range    Magnesium 2.2 1.6 - 2.6 mg/dL   POC TROPONIN    Collection Time: 06/04/20  8:14 AM   Result Value Ref Range    Troponin-I-POC 0.01 0.00 - 0.05 NG/ML   BNP POC ER    Collection Time: 06/04/20  9:19 AM   Result Value Ref Range    BNP POC 180.0 (H) 0 - 100 PG/ML   POC LACTATE    Collection Time: 06/04/20  9:21 AM   Result Value Ref Range    LACTIC ACID POC 2.3 (H) 0.5 - 2.0 MMOL/L   D-DIMER    Collection Time: 06/04/20  9:22 AM   Result Value Ref Range    D-Dimer 4,463 (H) <500 ng/mL FEU   COVID-19 (SARS-COV-2) PCR    Collection Time: 06/04/20  9:22 AM    Specimen: Nasopharyngeal; Flocked Swab   Result Value Ref Range    COVID-19 (SARS-CoV-2) PCR Source FLOCKED SWAB  NASOPHARYNGEAL       COVID-19 (SARS-CoV-2) PCR NOT DETECTED DN-NOT DETECTED   POC LACTATE    Collection Time: 06/04/20 11:24 AM   Result Value Ref Range LACTIC ACID POC 1.6 0.5 - 2.0 MMOL/L     Glucose: (!) 124 (06/04/20 0807)    CTA CHEST WO/W CONTRAST+POST P    Result Date: 06/04/2020  CHEST: 1. No pulmonary embolism. 2. Small right hydropneumothorax. 3. Low lung volumes with mild basilar opacities, likely atelectasis. Pneumothorax was discussed with Dr. Manson Passey by telephone at 11:26 AM on 06/04/2020. ABDOMEN AND PELVIS: 1. No focal inflammatory process or bowel obstruction. 2. Cirrhosis and portal hypertension with moderate ascites and varices. 3. Known polyp of the gastric antrum which is further described on recent upper endoscopy. By my electronic signature, I attest that I have personally reviewed the images for this examination and formulated the interpretations and opinions expressed in this report  Finalized by Darrick Meigs, M.D. on 06/04/2020 11:45 AM. Dictated by Norva Pavlov, MD on 06/04/2020 11:07 AM.     CHEST SINGLE VIEW    Result Date: 06/04/2020  Low lung volume with patchy bibasilar opacities, likely atelectasis. PA and lateral views of the chest could be obtained for further evaluation.Budd Palmer by Francis Dowse, M.D. on 06/04/2020 10:52 AM. Dictated by Francis Dowse, M.D. on 06/04/2020 10:51 AM.     CT ABD/PELV W CONTRAST    Result Date: 06/04/2020  CHEST: 1. No pulmonary embolism. 2. Small right hydropneumothorax. 3. Low lung volumes with  mild basilar opacities, likely atelectasis. Pneumothorax was discussed with Dr. Manson Passey by telephone at 11:26 AM on 06/04/2020. ABDOMEN AND PELVIS: 1. No focal inflammatory process or bowel obstruction. 2. Cirrhosis and portal hypertension with moderate ascites and varices. 3. Known polyp of the gastric antrum which is further described on recent upper endoscopy. By my electronic signature, I attest that I have personally reviewed the images for this examination and formulated the interpretations and opinions expressed in this report  Finalized by Darrick Meigs, M.D. on 06/04/2020 11:45 AM. Dictated by Norva Pavlov, MD on 06/04/2020 11:07 AM.       Pertinent radiology reviewed.

## 2020-06-04 NOTE — ED Notes
Blood transfusion started.

## 2020-06-04 NOTE — ED Notes
X-ray at bedside

## 2020-06-05 ENCOUNTER — Inpatient Hospital Stay: Admit: 2020-06-05 | Discharge: 2020-06-05 | Payer: MEDICARE

## 2020-06-05 MED ORDER — FENTANYL CITRATE (PF) 50 MCG/ML IJ SOLN
25 ug | INTRAVENOUS | 0 refills | Status: DC | PRN
Start: 2020-06-05 — End: 2020-06-07
  Administered 2020-06-05 – 2020-06-06 (×5): 25 ug via INTRAVENOUS

## 2020-06-05 MED ORDER — PANTOPRAZOLE 40 MG PO TBEC
40 mg | Freq: Two times a day (BID) | ORAL | 0 refills | Status: DC
Start: 2020-06-05 — End: 2020-06-07
  Administered 2020-06-05 – 2020-06-07 (×5): 40 mg via ORAL

## 2020-06-05 MED ORDER — LIDOCAINE 5 % TP PTMD
1 | Freq: Every day | TOPICAL | 0 refills | Status: DC
Start: 2020-06-05 — End: 2020-06-07
  Administered 2020-06-05 – 2020-06-07 (×3): 1 via TOPICAL

## 2020-06-05 NOTE — Progress Notes
Pt hemoglobin resulted 6.6 at 1844.     RN notified provider via voalte. Provider placed a repeat CBC order.     RN drew repeat CBC. Resulted 7.6 at 2015.

## 2020-06-06 ENCOUNTER — Encounter: Admit: 2020-06-06 | Discharge: 2020-06-06 | Payer: MEDICARE

## 2020-06-06 ENCOUNTER — Inpatient Hospital Stay: Admit: 2020-06-06 | Discharge: 2020-06-06 | Payer: MEDICARE

## 2020-06-06 NOTE — Telephone Encounter
Verified active Insurance via [employer]. [facility] [employer] Reference Number:  [id number]  [facility] Reference Number:  [id number]

## 2020-06-07 MED ORDER — LIDOCAINE 4 % TP PTMD
1 | Freq: Every day | TOPICAL | 1 refills | 30.00000 days | Status: AC
Start: 2020-06-07 — End: ?

## 2020-06-07 MED ORDER — FUROSEMIDE 40 MG PO TAB
20 mg | ORAL_TABLET | Freq: Every morning | ORAL | 0 refills | 90.00000 days | Status: AC
Start: 2020-06-07 — End: ?

## 2020-06-07 MED ORDER — OXYCODONE 5 MG PO TAB
5 mg | ORAL_TABLET | ORAL | 0 refills | 6.00000 days | Status: AC | PRN
Start: 2020-06-07 — End: ?

## 2020-06-07 MED ORDER — CIPROFLOXACIN HCL 500 MG PO TAB
500 mg | ORAL_TABLET | Freq: Every day | ORAL | 1 refills | 10.00000 days | Status: AC
Start: 2020-06-07 — End: ?

## 2020-06-09 ENCOUNTER — Encounter: Admit: 2020-06-09 | Discharge: 2020-06-09 | Payer: MEDICARE

## 2020-06-09 NOTE — Telephone Encounter
Spoke with pt today.  She reports she is feeling better.  She is scheduled for for IR procedures on 7/23 she will come get labs drawn at 920 to check hgb.

## 2020-06-10 ENCOUNTER — Encounter: Admit: 2020-06-10 | Discharge: 2020-06-10 | Payer: MEDICARE

## 2020-06-10 ENCOUNTER — Ambulatory Visit: Admit: 2020-06-10 | Discharge: 2020-06-10 | Payer: MEDICARE

## 2020-06-10 ENCOUNTER — Ambulatory Visit: Admit: 2020-06-10 | Discharge: 2020-06-11 | Payer: MEDICARE

## 2020-06-10 DIAGNOSIS — K7469 Other cirrhosis of liver: Secondary | ICD-10-CM

## 2020-06-10 DIAGNOSIS — D649 Anemia, unspecified: Secondary | ICD-10-CM

## 2020-06-10 DIAGNOSIS — K31819 Angiodysplasia of stomach and duodenum without bleeding: Secondary | ICD-10-CM

## 2020-06-10 DIAGNOSIS — K760 Fatty (change of) liver, not elsewhere classified: Secondary | ICD-10-CM

## 2020-06-10 DIAGNOSIS — R945 Abnormal results of liver function studies: Secondary | ICD-10-CM

## 2020-06-10 DIAGNOSIS — D500 Iron deficiency anemia secondary to blood loss (chronic): Secondary | ICD-10-CM

## 2020-06-10 LAB — CBC AND DIFF
Lab: 2.8 M/UL — ABNORMAL LOW (ref 4.0–5.0)
Lab: 22 % — ABNORMAL LOW (ref 36–45)
Lab: 7.1 10*3/uL (ref 4.5–11.0)
Lab: 0 10*3/uL (ref 0–0.20)
Lab: 0.1 10*3/uL (ref 0–0.45)
Lab: 0.6 10*3/uL — ABNORMAL LOW (ref 1.0–4.8)
Lab: 1 % (ref 0–2)
Lab: 1 10*3/uL — ABNORMAL HIGH (ref 0–0.80)
Lab: 14 % — ABNORMAL HIGH (ref 4–12)
Lab: 2 % (ref 0–5)
Lab: 2.6 M/UL — ABNORMAL LOW (ref 4.0–5.0)
Lab: 20 % — ABNORMAL LOW (ref 36–45)
Lab: 21 % — ABNORMAL HIGH (ref 11–15)
Lab: 25 pg — ABNORMAL LOW (ref 26–34)
Lab: 266 10*3/uL (ref 150–400)
Lab: 32 g/dL (ref 32.0–36.0)
Lab: 5.6 10*3/uL (ref 1.8–7.0)
Lab: 6.8 g/dL — ABNORMAL LOW (ref 12.0–15.0)
Lab: 7.5 10*3/uL (ref 4.5–11.0)
Lab: 74 % (ref 41–77)
Lab: 78 FL — ABNORMAL LOW (ref 80–100)
Lab: 8.3 FL (ref 7–11)
Lab: 9 % — ABNORMAL LOW (ref 24–44)

## 2020-06-10 LAB — COMPREHENSIVE METABOLIC PANEL
Lab: 1.2 mg/dL — ABNORMAL HIGH (ref 0.4–1.00)
Lab: 1.3 mg/dL — ABNORMAL HIGH (ref 0.3–1.2)
Lab: 129 MMOL/L — ABNORMAL LOW (ref 137–147)
Lab: 131 mg/dL — ABNORMAL HIGH (ref 70–100)
Lab: 2.8 g/dL — ABNORMAL LOW (ref 3.5–5.0)
Lab: 21 MMOL/L — ABNORMAL LOW (ref 21–30)
Lab: 218 U/L — ABNORMAL HIGH (ref 25–110)
Lab: 27 U/L — ABNORMAL HIGH (ref 7–56)
Lab: 35 mg/dL — ABNORMAL HIGH (ref 7–25)
Lab: 4.2 MMOL/L — ABNORMAL LOW (ref 3.5–5.1)
Lab: 46 mL/min — ABNORMAL LOW (ref >60)
Lab: 5.5 g/dL — ABNORMAL LOW (ref 6.0–8.0)
Lab: 55 mL/min — ABNORMAL LOW (ref >60)
Lab: 70 U/L — ABNORMAL HIGH (ref 7–40)
Lab: 8.1 mg/dL — ABNORMAL LOW (ref 8.5–10.6)
Lab: 9 10*3/uL (ref 3–12)
Lab: 99 MMOL/L — ABNORMAL LOW (ref 98–110)
Lab: 129 MMOL/L — ABNORMAL LOW (ref 137–147)
Lab: 4.2 MMOL/L — ABNORMAL HIGH (ref 3.5–5.1)

## 2020-06-10 LAB — PROTIME INR (PT): Lab: 1.6 g/dL — ABNORMAL HIGH (ref 0.8–1.2)

## 2020-06-10 MED ORDER — MIDODRINE 5 MG PO TAB
10 mg | Freq: Once | ORAL | 0 refills | Status: CP
Start: 2020-06-10 — End: ?
  Administered 2020-06-10: 19:00:00 10 mg via ORAL

## 2020-06-10 MED ORDER — ONDANSETRON HCL (PF) 4 MG/2 ML IJ SOLN
4 mg | Freq: Once | INTRAVENOUS | 0 refills | Status: DC
Start: 2020-06-10 — End: 2020-06-10

## 2020-06-10 MED ORDER — ALBUMIN, HUMAN 25 % IV SOLP
0 refills | Status: CP
Start: 2020-06-10 — End: ?

## 2020-06-10 NOTE — Telephone Encounter
Questions regarding lab values needing answered before discharging pt from IR. Please call Marisue Ivan at [phone]

## 2020-06-10 NOTE — Progress Notes
Patient's BP prior to starting blood transfusion is 91/43, all other VSS. Patient reports she did not take her noon dose of midodrine 10 mg and forgot to bring it with her. Coralee North, APRN notified. Telephone order from Keno to give 10 mg PO midodrine now.

## 2020-06-10 NOTE — Progress Notes
Patient's BP post transfusion was 93/48, patient feels fine.  Notified Coralee North, APRN, of diastolic BP < 50 per Jessica's orders.  Per Shanda Bumps, it is okay for patient to discharge home at this time. BP rechecked a few minutes later and was 90/50.

## 2020-06-10 NOTE — Patient Instructions
Discharge Instructions forParacentesis  Paracentesis is a procedure to remove extra fluid from your belly (abdomen). This fluid buildup in the abdomen is calledascites. The procedure may have been done to take a sample of the fluid. Or, it may have been done to drain the extra fluid from your abdomenand help make you more comfortable.      Ascites is buildup of excess fluid in the abdomen.   Home care   If you have pain after the procedure, your healthcare provider can prescribe or recommend pain medicines. Take these exactly as directed. If you stopped taking other medicines before the procedure, ask your provider when you can start them again.   Take it easy for 24 hours after the procedure. Don't do any physical activity until your provider says it's OK.   You will have a small bandage over the puncture site. Stitches, surgical staples, adhesive tapes, adhesive strips, or surgical glue may be used to close the incision. They also help stop bleeding and speed healing. You may take the bandage off in 24 hours.   Check the puncture site for the signs of infection listed below.    Follow-up care  Make a follow-up appointment with your healthcare provider as directed. During your follow-up visit, your provider will check your healing. Let your provider know how you are feeling. You can also discuss the cause of your ascites and if you need any further treatment. If your fluid is infected, you will be sent home on antibiotics. In some cases, the paracentesis may need to be repeated if the fluid returns. Your provider may also prescribe medicines that increase urination (diuretics) to decrease the buildup of fluid.   When to call your healthcare provider  Call your healthcare provider if you have any of the following after the procedure:   A fever of 100.4 F ( 38.0C) or higher, or as directed by your provider   Chills   Trouble breathing   Pain that doesn't go away even after taking pain medicine   Belly  pain not caused by having the skin punctured   Bleeding from the puncture site   More than a small amount of fluid leaking from the puncture site   Swollen belly   Signs of infection at the puncture site. These include increased pain, redness, or swelling, warmth, or bad-smelling drainage.   Blood in your urine   Feeling dizzy or lightheaded, or fainting  [employer] last reviewed this educational content on 08/19/2018   2000-2021 [employer]. All rights reserved. This information is not intended as a substitute for professional medical care. Always follow your healthcare professional's instructions.

## 2020-06-10 NOTE — Patient Instructions
[facility]  OUTPATIENT POST TRANSFUSION INSTRUCTIONS     During your transfusion you were monitored by nursing staff for signs of a transfusion reaction, however, you should continue to observe for the following symptoms after you have been dismissed from the clinic:     FEVER OF 100.5 OR HIGHER  CHILLS  NAUSEA OR VOMITING  FEELING FAINT OR DIZZY  SHORTNESS OF BREATH  DARK OR RED COLORED URINE  CHEST OR BACK PAIN  SHOCK OR LOSS OF CONSCIOUSNESS  YELLOWING OF THE EYES OR SKIN     If you or your caregiver notice any of these symptoms, contact your ordering physician immediately and go to the nearest Emergency Department for treatment. When you arrive at the Emergency Department notify them that you recently received a blood transfusion. *For more detailed signs and symptoms of a transfusion reaction, please refer to the [place] education information below.          When You Need a Blood Transfusion (Adult)  A blood transfusion may be done when you have lost blood because of an injury or during surgery. It can also be done because of diseases or conditions that affect the blood. Blood is made up of several different parts (blood products). You may receive some or all of these blood products during a transfusion. Blood for transfusion is usually donated from another person (donor). Strict measures are taken to make sure that donated blood is safe before it's given to you. This sheet helps you understand how a blood transfusion is done. Your healthcare provider will discuss your condition with you and answer your questions.   The parts of blood  Blood can be broken down into different parts that perform special roles in the body. These parts include:  ? Red blood cells, which carry oxygen throughout the body.  ? Platelets, which help stop bleeding.  ? Plasma (the liquid part of blood), which carries red blood cells and platelets throughout the body. Plasma also helps platelets in stopping bleeding. Where does donated blood come from?  ? Volunteer donors. These are people who donate their blood to help others in need of blood. Blood donation can take place at several places, including a hospital, blood bank, or during a blood drive.  ? Directed donation. If you need a blood transfusion during a planned surgery, family and friends can have their blood tested for compatibility and donate blood for you before the surgery. This needs to be done at least 7 day(s) in advance. This is because the blood must be tested for safety.  ? Autologous donation. This is also called self-donation. For planned surgery, you can donate your own blood starting up to 6 weeks before surgery.  Are blood transfusions safe?  Donated blood is tested and processed to make sure that the blood is safe:  ? The health and medical history of each donor is carefully screened. If a person is considered high-risk for infection or problems, he or she isn't accepted as a blood donor.  ? Donated blood is tested for infections such as hepatitis, syphilis, and HIV (the virus that causes AIDS). If the tested blood is found to be unsafe, it's destroyed.  ? Blood is divided into four types: A, B, AB, and O. Blood also has Rh types: positive (+) and negative (-). You can only receive blood products that are compatible with (match) your blood type. A sample of your blood is tested for compatibility with donated blood. This is  done before blood products are prepared for a transfusion.  How is a blood transfusion done?  A blood transfusion takes place in a blood center, infusion center, hospital room, or operating room. Your healthcare provider will discuss the blood transfusion with you before it's done. You'll need to give permission for the blood transfusion by signing a consent form.  ? Two healthcare providers confirm your identity. They also confirm that they have the correct blood product(s) for you.  ? An intravenous (IV) line is placed in a vein if you do not already have an IV.  ? The blood product comes in a plastic bag that is hung on an IV pole. The blood product flows from the bag into your IV line. The IV line may be connected to a pump, which controls the transfusion rate. You may receive more than one kind of blood product through the IV.  ? Your vital signs (blood pressure, heart rate, respiratory rate, and temperature) are checked throughout the transfusion. This is to make sure you are not having a reaction to the blood product.  ? The IV line may be removed once the transfusion is complete.  Possible risks and complications of blood transfusions  Most transfusions are problem free. In some cases, reactions occur. These can happen within seconds to minutes during the transfusion or a week to a few months after the transfusion. Call your doctor or nurse right away if you have any of the signs or symptoms in the table below during or after a transfusion:  Reaction Timing Symptoms   Allergic reaction (mild) ? Within seconds to minutes during the transfusion  ? Up to 24 hours after the transfusion Hives or red welts on the skin, mild itching, rash, localized swelling, flushing (red face), wheezing, shortness of breath, or stridor (high-pitched noise or sound)   Anaphylactic reaction ? Within seconds to minutes during the transfusion  ? Up to 24 hours after the transfusion Shortness of breath, flushing (red face), wheezing, labored (working hard) breathing, low blood pressure, localized swelling, chest tightness, or cramps   Febrile nonhemolytic reaction ? Within minutes to hours during the transfusion  ? Within a few hours to 24 hours after the transfusion Fever (increase of 1? C or higher), chills, flushing (red face), nausea, headache, minor discomfort, or mild shortness of breath   Acute immune hemolytic reaction ? Within minutes during the transfusion  ? Up to 24 hours after the transfusion Fever, red or brown urine, back pain, fast heart rate (tachycardia), abdominal pain, low blood pressure, feeling anxious, chills, chest pain, nausea, or fainting spells   Transfusion-related acute lung injury (TRALI) ? Within 1 to 2 hours during the transfusion  ? Up to 6 hours after the transfusion Shortness of breath, trouble breathing, low blood pressure, fever, pulmonary edema   Transfusion-associated circulatory overload ? Near the end of the transfusion  ? Within 6 hours after the transfusion Shortness of breath, fast heart rate (tachycardia), problems breathing when lying on back, abnormal blood pressure   Post-transfusion purpura (PUP) ? Within 1 week  ? Up to 48 days after the transfusion Purple spots on skin; nose bleed; bleeding from the urinary tract, abdomen, colon, or rectum; fever; or chills     Delayed transfusion-related acute lung injury (TRALI) ? Within 72 hours (3 days) after the transfusion Sudden onset of respiratory distress or trouble breathing   Delayed hemolytic reaction ? Within 3 to 7 days  ? Up to weeks after the  transfusion Low-grade fever, mild jaundice (yellowing of the skin and whites of the eyes), decrease in hematocrit, chills, chest pain, back pain, nausea   ? 2000-2019 [employer], [place]. [address] [place], [place] [address]. All rights reserved. This information is not intended as a substitute for professional medical care. Always follow your healthcare professional's instructions.

## 2020-06-10 NOTE — Progress Notes
Patient's blood has been transfusing for approximately 1.5 hours, 10 mg midodrine given 1 hour ago. BP now 88/45, all other VSS, patient asymptomatic. Kristin North, APRN notified. Per Shanda Bumps, will notify her if BP is less than 90/50 but if it is after 5 pm notify Oncall GI and they can decide if patient is stable to discharge home or not.

## 2020-06-10 NOTE — H&P (View-Only)
IR Pre-Procedure History and Physical/Sedation Plan    Procedure Date: 06/10/2020     Planned Procedure(s):  Paracentesis    Procedural code status: Prior    Indication:  Ascites  __________________________________________________________________    Chief Complaint:  Ascites    History of Present Illness: Kristin Moody is a 64 y.o. female with a history as listed below who presents today for procedure.     Patient Active Problem List    Diagnosis Date Noted   ? Pleurodynia 06/04/2020   ? Pneumothorax 06/04/2020   ? 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 Hypotension 02/10/2020   ? 'Light-for-dates' infant with signs of fetal malnutrition 01/22/2020   ? 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 Anemia 09/28/2019   ? Encephalopathy 08/12/2019   ? Spasticity 08/10/2019   ? Hyperreflexia 08/10/2019   ? 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 Right leg weakness 05/14/2019   ? 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 Iron (Fe) deficiency anemia 01/19/2019   ? Preop cardiovascular exam 01/07/2019   ? Pre-transplant evaluation for liver transplant 01/06/2019   ? Cirrhosis (HCC) 12/16/2018   ? Acute on chronic anemia 12/16/2018   ? Lactic acidosis 12/16/2018   ? GI bleed 10/24/2018   ? Portal hypertension (HCC) 08/28/2018   ? Confusion 08/27/2018   ? Dyspnea 08/27/2018   ? Chronic abdominal pain 08/27/2018   ? 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 Acute on chronic blood loss anemia 08/27/2018   ? Iron deficiency anemia 04/13/2018   ? Pneumonia due to infectious organism 04/13/2018   ? Melena 04/10/2018   ? Esophageal varices without bleeding (HCC) 02/25/2018   ? 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 Lower abdominal pain 10/24/2014   ? Chest discomfort 09/21/2014   ? Essential hypertension 08/13/2012   ? Abnormal liver function tests 05/13/2011   ? Fatty liver disease, nonalcoholic 05/13/2011   ? Obesity (BMI 30-39.9) 05/13/2011 ? Slow transit constipation 05/13/2011     Medical History:   Diagnosis Date   ? Aneurysm (HCC)    ? 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 Diverticulosis of colon     descending and sigmoid colon   ? Dyspnea    ? Essential hypertension 08/13/2012   ? Fatty infiltration of liver    ? HTN (hypertension)    ? Obesity (BMI 30-39.9) 05/13/2011   ? Preop cardiovascular exam 01/07/2019   ? Sinus infection    ? Type 2 diabetes mellitus (HCC) 09/21/2014      Surgical History:   Procedure Laterality Date   ? HYSTERECTOMY  1994   ? UPPER GASTROINTESTINAL ENDOSCOPY  2009   ? COLONOSCOPY  2009   ? CYSTOCELE REPAIR  2009    with endocele repair   ? RECTOCELE REPAIR  03/2009   ? LIVER BIOPSY  07/17/2010   ? COLONOSCOPY N/A 11/08/2017    Performed by Dawna Part, MD at [facility] ENDO   ? ESOPHAGOGASTRODUODENOSCOPY N/A 11/08/2017    Performed by Dawna Part, MD at [facility] ENDO   ? ESOPHAGOGASTRODUODENOSCOPY WITH BIOPSY - FLEXIBLE  11/08/2017    Performed by Dawna Part, MD at [facility] ENDO   ? COLONOSCOPY WITH HOT BIOPSY FORCEPS REMOVAL TUMOR/ POLYP/ OTHER LESION  11/08/2017    Performed by Dawna Part, MD at [facility] ENDO   ? ESOPHAGOGASTRODUODENOSCOPY WITH BAND LIGATION ESOPHAGEAL/  GASTRIC VARICES - FLEXIBLE N/A 04/10/2018    Performed by Normajean Baxter, MD at [facility] ENDO   ? ESOPHAGOGASTRODUODENOSCOPY WITH BIOPSY - FLEXIBLE N/A 04/10/2018    Performed by Normajean Baxter, MD at [facility] ENDO   ? ESOPHAGOGASTRODUODENOSCOPY WITH CONTROL OF BLEEDING - FLEXIBLE N/A 04/25/2018    Performed by Jolee Ewing, MD at [facility] ENDO   ? ESOPHAGOGASTRODUODENOSCOPY WITH BIOPSY - FLEXIBLE with push enteroscopy N/A 08/28/2018    Performed by Celesta Gentile, MD at [facility] ENDO   ? EGD N/A 10/27/2018    Performed by Eliott Nine, MD at [facility] ENDO   ? ESOPHAGOGASTRODUODENOSCOPY WITH SNARE REMOVAL TUMOR/ POLYP/ OTHER LESION - FLEXIBLE N/A 10/27/2018    Performed by Eliott Nine, MD at [facility] ENDO   ? ESOPHAGOGASTRODUODENOSCOPY WITH BIOPSY - FLEXIBLE N/A 12/16/2018 Performed by Buckles, Vinnie Level, MD at [facility] ENDO   ? ESOPHAGOGASTRODUODENOSCOPY WITH CONTROL OF BLEEDING - FLEXIBLE N/A 12/16/2018    Performed by Buckles, Vinnie Level, MD at [facility] ENDO   ? ESOPHAGOGASTRODUODENOSCOPY WITH SPECIMEN COLLECTION BY BRUSHING/ WASHING N/A 01/22/2019    Performed by Veneta Penton, MD at [facility] ENDO   ? ESOPHAGOGASTRODUODENOSCOPY WITH DILATION ESOPHAGUS WITH BALLOON 30 MM OR GREATER - FLEXIBLE N/A 01/22/2019    Performed by Veneta Penton, MD at [facility] ENDO   ? ESOPHAGOGASTRODUODENOSCOPY WITH BIOPSY - FLEXIBLE N/A 01/22/2019    Performed by Veneta Penton, MD at [facility] ENDO   ? ESOPHAGOGASTRODUODENOSCOPY WITH BIOPSY - FLEXIBLE N/A 06/19/2019    Performed by Dawna Part, MD at [facility] ENDO   ? ESOPHAGOGASTRODUODENOSCOPY [WITH APC]  WITH SPECIMEN COLLECTION BY BRUSHING/ WASHING N/A 08/07/2019    Performed by Dawna Part, MD at [facility] ENDO   ? ESOPHAGOGASTRODUODENOSCOPY WITH SPECIMEN COLLECTION BY BRUSHING/ WASHING N/A 09/10/2019    Performed by Buckles, Vinnie Level, MD at [facility] ENDO   ? ESOPHAGOGASTRODUODENOSCOPY WITH CONTROL OF BLEEDING - FLEXIBLE N/A 09/10/2019    Performed by Buckles, Vinnie Level, MD at [facility] ENDO   ? ESOPHAGOGASTRODUODENOSCOPY WITH CONTROL OF BLEEDING - FLEXIBLE N/A 02/12/2020    Performed by Lenor Derrick, MD at [facility] ENDO   ? ESOPHAGOGASTRODUODENOSCOPY WITH BIOPSY - FLEXIBLE N/A 02/26/2020    Performed by Buckles, Vinnie Level, MD at [facility] ENDO   ? ESOPHAGOGASTRODUODENOSCOPY WITH BIOPSY - FLEXIBLE N/A 05/18/2020    Performed by Lenor Derrick, MD at [facility] ENDO   ? ESOPHAGOGASTRODUODENOSCOPY WITH CONTROL OF BLEEDING - FLEXIBLE N/A 05/18/2020    Performed by Lenor Derrick, MD at [facility] ENDO   ? CHOLECYSTECTOMY     ? COLONOSCOPY     ? ESOPHAGOGASTRIC FUNDOPLICATION  2003, 2004    laparoscopic   ? ESOPHAGOGASTRIC FUNDOPLICATION     ? HX HYSTERECTOMY     ? PR ESOPHAGOSCOPY FLEXIBLE TRANSORAL DIAGNOSTIC        Social History     Tobacco Use   ? Smoking status: Never Smoker   ? Smokeless tobacco: Never Used   Substance Use Topics ? Alcohol use: No      Family History   Problem Relation Age of Onset   ? Other Mother    ? Kidney Failure Mother    ? Osteoporosis Mother    ? Cancer Father         esophageal   ? Liver Disease Sister         endstage, s/p liver transplant   ? Cancer Brother         tonsil, liver    ?  Hip Fracture Neg Hx       Medications Prior to Admission   Medication Sig Dispense Refill Last Dose   ? ciprofloxacin (CIPRO) 500 mg tablet Take one tablet by mouth daily. 30 tablet 1    ? duloxetine DR (CYMBALTA) 60 mg capsule Take 60 mg by mouth daily.      ? furosemide (LASIX) 40 mg tablet Take one-half tablet by mouth every morning. 90 tablet 0    ? lidocaine (ASPERCREME PATCH) 4 % topical patch Apply one each topically to affected area daily. 5 each 1    ? midodrine (PROAMITINE) 10 mg tablet Take one tablet by mouth three times daily. 90 tablet 1    ? oxyCODONE (ROXICODONE) 5 mg tablet Take one tablet by mouth every 6 hours as needed 20 tablet 0    ? pantoprazole DR (PROTONIX) 40 mg tablet TAKE 1 TABLET BY MOUTH TWICE A DAY 180 tablet 1    ? polyethylene glycol 3350 (MIRALAX) 17 g packet Take one packet by mouth twice daily.      ? promethazine (PHENERGAN) 25 mg tablet Take 25 mg by mouth every 4 hours as needed. Just uses it with Tramadol      ? rifAXIMin (XIFAXAN) 550 mg tablet Take one tablet by mouth twice daily. 180 tablet 3    ? zinc sulfate 220 mg (50 mg elemental zinc) capsule Take one capsule by mouth daily. 90 capsule 3      Allergies   Allergen Reactions   ? Tegaderm BLISTERS   ? Adhesive Tape (Rosins) RASH     Other reaction(s): irritated skin   ? Sulfa (Sulfonamide Antibiotics) RASH     Other reaction(s): Unknown Reaction   ? Codeine NAUSEA AND VOMITING   ? Morphine SEE COMMENTS     Pt reports burns her veins  Other reaction(s): Unknown Reaction   ? Penicillin G SEE COMMENTS     Throat issues, blisters in throat    ? Tramadol NAUSEA ONLY and ITCHING     Takes this medication regularly        Review of Systems  A comprehensive review of systems was negative.    Previous Personal Anesthetic/Sedation History:  Denies adverse events related to sedation/anesthesia.     Previous Family Anesthetic/Sedation History: Denies adverse events related to sedation/anesthesia.    Physical Exam:  Vital Signs: Last Filed In 24 Hours Vital Signs: 24 Hour Range                General appearance: Alert and no distress noted.  Neurologic: Grossly normal.  Lungs: Non labored at rest.  Heart: Regular rate and rhythm  Abdomen: Distended     Airway: airway assessment performed  Mallampati II (soft palate, uvula, fauces visible)  Head and Neck: no abnormalities noted  Mouth: no abnormalities noted  NPO status: Acceptable  Pregnancy Status: Not Pregnant  Anesthesia Classification:  ASA III (A patient with a severe systemic disease that limits activity, but is not incapacitating)  Pre-operative anxiolysis Plan: N/A  Sedation/Medication Plan: Lidocaine  Discussion/Reviews:  Physician has discussed risks and alternatives of this type of sedation and above planned procedures with patient    Lab/Radiology/Other Diagnostic Tests:  Labs:  Pertinent labs reviewed           Pollyann Kennedy, APRN-NP  Pager [phone]

## 2020-06-10 NOTE — Telephone Encounter
Spoke to [place] in [employer]. Hgb 6.8. Discussed plan for blood transfusion. Confirmed with [facility] that they are able to take patient now for transfusion. Marisue Ivan to update pt with the plan. Order placed for 1U PRBC's.

## 2020-06-10 NOTE — Other
Immediate Post Procedure Note    Date: 06/10/2020                                    Attending Physician:   Cameron Ali  Performing Provider:  Nicholes Stairs, MD    Consent:  Consent obtained from patient.  Time out performed: Consent obtained, correct patient verified, correct procedure verified, correct site verified, patient marked as necessary.  Pre/Post Procedure Diagnosis:  Ascites  Indications:  Ascites    Anesthesia: Local  Procedure(s):  US guided paracentesis  Findings:  Successful Ultrasound guided paracentesis      Estimated Blood Loss:  None/Negligible  Specimen(s) Removed/Disposition:  Yes, sent to pathology  Complications: None  Patient Tolerated Procedure: Well  Post-Procedure Condition:  stable    Nicholes Stairs, MD

## 2020-06-10 NOTE — Progress Notes
Criteria met for discharge. VSS. Right abodmen Site clean, dry, and intact. Reviewed discharge information and no further questions. Patient relative to drive home. Transported to check in for blood transfusion in different department of [facility], IV remains in at this time for that transfusion.

## 2020-06-11 DIAGNOSIS — K7469 Other cirrhosis of liver: Secondary | ICD-10-CM

## 2020-06-11 LAB — GRAM STAIN

## 2020-06-14 ENCOUNTER — Encounter: Admit: 2020-06-14 | Discharge: 2020-06-14 | Payer: MEDICARE

## 2020-06-14 ENCOUNTER — Ambulatory Visit: Admit: 2020-06-14 | Discharge: 2020-06-14 | Payer: MEDICARE

## 2020-06-14 DIAGNOSIS — D649 Anemia, unspecified: Secondary | ICD-10-CM

## 2020-06-14 DIAGNOSIS — K31819 Angiodysplasia of stomach and duodenum without bleeding: Secondary | ICD-10-CM

## 2020-06-14 DIAGNOSIS — R945 Abnormal results of liver function studies: Secondary | ICD-10-CM

## 2020-06-14 DIAGNOSIS — D500 Iron deficiency anemia secondary to blood loss (chronic): Secondary | ICD-10-CM

## 2020-06-14 LAB — COMPREHENSIVE METABOLIC PANEL
Lab: 1.1 mg/dL — ABNORMAL HIGH (ref 0.4–1.00)
Lab: 1.9 mg/dL — ABNORMAL HIGH (ref 0.3–1.2)
Lab: 102 MMOL/L (ref 98–110)
Lab: 132 MMOL/L — ABNORMAL LOW (ref 137–147)
Lab: 137 mg/dL — ABNORMAL HIGH (ref 70–100)
Lab: 151 U/L — ABNORMAL HIGH (ref 25–110)
Lab: 2.8 g/dL — ABNORMAL LOW (ref 3.5–5.0)
Lab: 23 MMOL/L (ref 21–30)
Lab: 23 U/L (ref 7–56)
Lab: 33 mg/dL — ABNORMAL HIGH (ref 7–25)
Lab: 4.6 MMOL/L (ref 3.5–5.1)
Lab: 46 mL/min — ABNORMAL LOW (ref >60)
Lab: 5.2 g/dL — ABNORMAL LOW (ref 6.0–8.0)
Lab: 56 U/L — ABNORMAL HIGH (ref 7–40)
Lab: 56 mL/min — ABNORMAL LOW (ref >60)
Lab: 8.7 mg/dL (ref 8.5–10.6)
Lab: 7 (ref 3–12)

## 2020-06-14 LAB — CBC AND DIFF
Lab: 0.2 10*3/uL (ref 0–0.45)
Lab: 0.7 10*3/uL — ABNORMAL LOW (ref 1.0–4.8)
Lab: 1 % (ref 0–2)
Lab: 11 % — ABNORMAL LOW (ref 24–44)
Lab: 2.9 M/UL — ABNORMAL LOW (ref 4.0–5.0)
Lab: 21 % — ABNORMAL HIGH (ref 11–15)
Lab: 23 % — ABNORMAL LOW (ref 36–45)
Lab: 261 10*3/uL (ref 150–400)
Lab: 3 % (ref 0–5)
Lab: 31 g/dL — ABNORMAL LOW (ref 32.0–36.0)
Lab: 5 10*3/uL (ref 1.8–7.0)
Lab: 7 10*3/uL (ref 4.5–11.0)
Lab: 7.4 g/dL — ABNORMAL LOW (ref 12.0–15.0)
Lab: 73 % (ref 41–77)
Lab: 8.5 FL (ref 7–11)
Lab: 80 FL (ref 80–100)

## 2020-06-14 LAB — PROTIME INR (PT): Lab: 1.5 % — ABNORMAL HIGH (ref 0.8–1.2)

## 2020-06-16 ENCOUNTER — Encounter: Admit: 2020-06-16 | Discharge: 2020-06-16 | Payer: MEDICARE

## 2020-06-16 DIAGNOSIS — K31819 Angiodysplasia of stomach and duodenum without bleeding: Secondary | ICD-10-CM

## 2020-06-16 MED ORDER — MIDODRINE 10 MG PO TAB
15 mg | ORAL_TABLET | Freq: Three times a day (TID) | ORAL | 3 refills | Status: DC
Start: 2020-06-16 — End: 2020-06-28

## 2020-06-16 NOTE — Patient Education
Dear?Ms Rogacki,  ?  Thank you for choosing [facility] Interventional Radiology for your procedure. Your appointment information is listed below:  ?  Appointment Date:?8/5, 8/12, 8/19 and 07/14/2020  Appointment Time:?9AM  Arrival Time:?8AM  Location:   ?  ? Main Campus:?[address], [place], [place]  Parking: [address] Park[address] Garage  ?  ?  ?  INTERVENTIONAL RADIOLOGY  PRE-PROCEDURE INSTRUCTIONS LOCAL  ?  You are scheduled for a procedure in Interventional Radiology. ?Please follow these instructions and any direction from your Primary Care/Managing Physician. ?If you have questions about your procedure or need to reschedule please call [phone].  ?  ?  Medication Instructions: ?Continue scheduled medication. ??  ?  Diet Instructions: Maintain regular diet with no restrictions.   ?  Day of Exam Instructions:  1. Bathe or shower with an antibacterial soap prior to your appointment.  2. Bring a list of your current medications and the dosages.  3. Wear comfortable clothing and leave valuables at home.  4. Arrive (1) hour prior to your appointment. ?This time will be spent registering, interviewing, assessing, educating, and preparing you for the test.  ? You will be with [place] anywhere from 30 minutes to 2 hours after your exam depending on your procedure.  5. Depending on the procedure and as instructed by the nurse a responsible adult may be required to drive you home (no Benedetto Goad, taxis or buses are allowed). If a driver is required and you do not have one ?we will be unable to perform your procedure.   6. The nurse will give you discharge instructions about your care and activities after the procedure.

## 2020-06-16 NOTE — Progress Notes
Interventional Radiology Outpatient Scheduling Checklist  ?  ??1.??Name of Procedure(s):???Paracentesis  ?  ??2.??Date of Procedure:???8/5, 8/12, 8/19 and 07/14/2020???  ?  ??3.??Arrival Time:?0800  ?  ??4.??Procedure Time:??0900  ?  ??5.??Correct Procedural Room Assignment:??IR BH #7?[facility]  ?  ??6.??Blood Thinners Triaged and instructed per protocol: Y/N/NA: ?NA  Confirmed accurate instructions sent to patient: Y/N: ?NA   ?  ??7.??Procedure Order Verified: Y/N: ?Yes  ?  ??9.??Patient instructed to have a driver: Y/N/NA: ?Yes  ?  10.??Patient instructed on NPO status: Y/N/NA: ?NO Restrictions.  Confirmed accurate instructions sent to patient: Y/N: ?Yes  ?  11.??Specimen needed: Y/N/NA: ?Yes   Verified Order placed: Y/N: ?Yes  ?  12.??Allergies Verified:??Y/N:??Yes  ?  13.??Is there an Iodine Allergy: Y/N:??No  Does the Procedure Require contrast: Y/N:??NO  If so, was the IR- Contrast Allergy Pre-Procedure Medication protocol ordered: Y/NA: ?NA  ?  14.??Does the patient have labs according to IR Pre-procedure Laboratory Parameter policy: Y/N/NA: ?Yes  If No, was the patient instructed to obtain labs prior to procedure: Y/N/NA: ?NA   ?  15.??Will the patient need to be admitted or have a possible admission: Y/N: ?No  If yes, confirmed accurate instructions sent to patient: Y/N/NA: ?NA   ?  16.??Patient States Understanding:?Y/N: ?Yes  ?  17.??History of OSA:??Y/N: ?No  If yes, confirm request to bring CPAP sent to patient: Y/N/NA: ?NA  ?  18. Patient declines electronic procedure instructions: Y/N: ?No

## 2020-06-17 ENCOUNTER — Encounter: Admit: 2020-06-17 | Discharge: 2020-06-17 | Payer: MEDICARE

## 2020-06-17 ENCOUNTER — Ambulatory Visit: Admit: 2020-06-17 | Discharge: 2020-06-17 | Payer: MEDICARE

## 2020-06-17 DIAGNOSIS — K760 Fatty (change of) liver, not elsewhere classified: Secondary | ICD-10-CM

## 2020-06-17 DIAGNOSIS — K7469 Other cirrhosis of liver: Secondary | ICD-10-CM

## 2020-06-17 DIAGNOSIS — K31819 Angiodysplasia of stomach and duodenum without bleeding: Secondary | ICD-10-CM

## 2020-06-17 DIAGNOSIS — D500 Iron deficiency anemia secondary to blood loss (chronic): Secondary | ICD-10-CM

## 2020-06-17 LAB — GRAM STAIN

## 2020-06-17 LAB — COMPREHENSIVE METABOLIC PANEL
Lab: 102 MMOL/L (ref 98–110)
Lab: 127 mg/dL — ABNORMAL HIGH (ref 70–100)
Lab: 131 MMOL/L — ABNORMAL LOW (ref 137–147)
Lab: 4.5 MMOL/L (ref 3.5–5.1)

## 2020-06-17 LAB — PROTIME INR (PT): Lab: 1.6 — ABNORMAL HIGH (ref 0.8–1.2)

## 2020-06-17 LAB — CBC AND DIFF
Lab: 2.8 M/UL — ABNORMAL LOW (ref 4.0–5.0)
Lab: 7.4 10*3/uL — ABNORMAL HIGH (ref 4.5–11.0)

## 2020-06-17 MED ORDER — ALBUMIN, HUMAN 25 % IV SOLP
0 refills | Status: CP
Start: 2020-06-17 — End: ?

## 2020-06-17 NOTE — Procedures
Immediate Post Procedure Note    Date:  06/17/2020                                      Attending Physician:   Thomasena Edis  Assistant(s):  None      Procedure(s):  Paracentesis    Preprocedure/postprocedure diagnosis:  Abdominal Ascites  Description of procedure and procedural findings: Clear yellow ascites aspirated from the abdomen.  Total volumes pending, please refer to separate dictation.   Anesthesia: Local 10 mL 1% lidocaine without epinephrine  Sedation/Medication Plan: Other    Time out performed: Consent obtained, correct patient verified, correct procedure verified, correct site verified, patient marked as necessary.  Estimated Blood Loss:  None/Negligible  Specimen(s) Removed/Disposition:  None  Complications: None      Maricela Curet, MD

## 2020-06-17 NOTE — Progress Notes
Called [employer] to verify which labs will need to drawn today prior to procedure. Will proceed with 4 tubes of blood and 2 orders for paracentesis fluid.

## 2020-06-17 NOTE — Progress Notes
Pt verbalized understanding of discharge. Pt educated on incisional care.All questions answered. Pt's belongings sent with pt. All vital signs stable, discharge criteria met. Pt transported via wheelchair [facility].

## 2020-06-17 NOTE — H&P (View-Only)
IR Pre-Procedure History and Physical/Sedation Plan    Procedure Date: 06/17/2020     Planned Procedure(s):  Paracentesis     Procedural code status: Prior    Indication:  Ascites  __________________________________________________________________    Chief Complaint:  Ascites    History of Present Illness: Kristin Moody is a 64 y.o. female with a history as listed below who presents today for procedure. She denies concerns.     Patient Active Problem List    Diagnosis Date Noted   ? Pleurodynia 06/04/2020   ? Pneumothorax 06/04/2020   ? 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 Hypotension 02/10/2020   ? 'Light-for-dates' infant with signs of fetal malnutrition 01/22/2020   ? 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 Anemia 09/28/2019   ? Encephalopathy 08/12/2019   ? Spasticity 08/10/2019   ? Hyperreflexia 08/10/2019   ? 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 Right leg weakness 05/14/2019   ? 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 Iron (Fe) deficiency anemia 01/19/2019   ? Preop cardiovascular exam 01/07/2019   ? Pre-transplant evaluation for liver transplant 01/06/2019   ? Cirrhosis (HCC) 12/16/2018   ? Acute on chronic anemia 12/16/2018   ? Lactic acidosis 12/16/2018   ? GI bleed 10/24/2018   ? Portal hypertension (HCC) 08/28/2018   ? Confusion 08/27/2018   ? Dyspnea 08/27/2018   ? Chronic abdominal pain 08/27/2018   ? 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 Acute on chronic blood loss anemia 08/27/2018   ? Iron deficiency anemia 04/13/2018   ? Pneumonia due to infectious organism 04/13/2018   ? Melena 04/10/2018   ? Esophageal varices without bleeding (HCC) 02/25/2018   ? 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 Lower abdominal pain 10/24/2014   ? Chest discomfort 09/21/2014   ? Essential hypertension 08/13/2012   ? Abnormal liver function tests 05/13/2011   ? Fatty liver disease, nonalcoholic 05/13/2011   ? Obesity (BMI 30-39.9) 05/13/2011   ? Slow transit constipation 05/13/2011     Medical History:   Diagnosis Date   ? Aneurysm (HCC)    ? 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 Diverticulosis of colon     descending and sigmoid colon   ? Dyspnea    ? Essential hypertension 08/13/2012   ? Fatty infiltration of liver    ? HTN (hypertension)    ? Obesity (BMI 30-39.9) 05/13/2011   ? Preop cardiovascular exam 01/07/2019   ? Sinus infection    ? Type 2 diabetes mellitus (HCC) 09/21/2014      Surgical History:   Procedure Laterality Date   ? HYSTERECTOMY  1994   ? UPPER GASTROINTESTINAL ENDOSCOPY  2009   ? COLONOSCOPY  2009   ? CYSTOCELE REPAIR  2009    with endocele repair   ? RECTOCELE REPAIR  03/2009   ? LIVER BIOPSY  07/17/2010   ? COLONOSCOPY N/A 11/08/2017    Performed by Dawna Part, MD at [facility] ENDO   ? ESOPHAGOGASTRODUODENOSCOPY N/A 11/08/2017    Performed by Dawna Part, MD at [facility] ENDO   ? ESOPHAGOGASTRODUODENOSCOPY WITH BIOPSY - FLEXIBLE  11/08/2017    Performed by Dawna Part, MD at [facility] ENDO   ? COLONOSCOPY WITH HOT BIOPSY FORCEPS REMOVAL TUMOR/ POLYP/ OTHER LESION  11/08/2017    Performed by Dawna Part, MD at [facility] ENDO   ?  ESOPHAGOGASTRODUODENOSCOPY WITH BAND LIGATION ESOPHAGEAL/ GASTRIC VARICES - FLEXIBLE N/A 04/10/2018    Performed by Normajean Baxter, MD at [facility] ENDO   ? ESOPHAGOGASTRODUODENOSCOPY WITH BIOPSY - FLEXIBLE N/A 04/10/2018    Performed by Normajean Baxter, MD at [facility] ENDO   ? ESOPHAGOGASTRODUODENOSCOPY WITH CONTROL OF BLEEDING - FLEXIBLE N/A 04/25/2018    Performed by Jolee Ewing, MD at [facility] ENDO   ? ESOPHAGOGASTRODUODENOSCOPY WITH BIOPSY - FLEXIBLE with push enteroscopy N/A 08/28/2018    Performed by Celesta Gentile, MD at [facility] ENDO   ? EGD N/A 10/27/2018    Performed by Eliott Nine, MD at [facility] ENDO   ? ESOPHAGOGASTRODUODENOSCOPY WITH SNARE REMOVAL TUMOR/ POLYP/ OTHER LESION - FLEXIBLE N/A 10/27/2018    Performed by Eliott Nine, MD at [facility] ENDO   ? ESOPHAGOGASTRODUODENOSCOPY WITH BIOPSY - FLEXIBLE N/A 12/16/2018    Performed by Buckles, Vinnie Level, MD at [facility] ENDO   ? ESOPHAGOGASTRODUODENOSCOPY WITH CONTROL OF BLEEDING - FLEXIBLE N/A 12/16/2018    Performed by Buckles, Vinnie Level, MD at [facility] ENDO   ? ESOPHAGOGASTRODUODENOSCOPY WITH SPECIMEN COLLECTION BY BRUSHING/ WASHING N/A 01/22/2019    Performed by Veneta Penton, MD at [facility] ENDO   ? ESOPHAGOGASTRODUODENOSCOPY WITH DILATION ESOPHAGUS WITH BALLOON 30 MM OR GREATER - FLEXIBLE N/A 01/22/2019    Performed by Veneta Penton, MD at [facility] ENDO   ? ESOPHAGOGASTRODUODENOSCOPY WITH BIOPSY - FLEXIBLE N/A 01/22/2019    Performed by Veneta Penton, MD at [facility] ENDO   ? ESOPHAGOGASTRODUODENOSCOPY WITH BIOPSY - FLEXIBLE N/A 06/19/2019    Performed by Dawna Part, MD at [facility] ENDO   ? ESOPHAGOGASTRODUODENOSCOPY [WITH APC]  WITH SPECIMEN COLLECTION BY BRUSHING/ WASHING N/A 08/07/2019    Performed by Dawna Part, MD at [facility] ENDO   ? ESOPHAGOGASTRODUODENOSCOPY WITH SPECIMEN COLLECTION BY BRUSHING/ WASHING N/A 09/10/2019    Performed by Buckles, Vinnie Level, MD at [facility] ENDO   ? ESOPHAGOGASTRODUODENOSCOPY WITH CONTROL OF BLEEDING - FLEXIBLE N/A 09/10/2019    Performed by Buckles, Vinnie Level, MD at [facility] ENDO   ? ESOPHAGOGASTRODUODENOSCOPY WITH CONTROL OF BLEEDING - FLEXIBLE N/A 02/12/2020    Performed by Lenor Derrick, MD at [facility] ENDO   ? ESOPHAGOGASTRODUODENOSCOPY WITH BIOPSY - FLEXIBLE N/A 02/26/2020    Performed by Buckles, Vinnie Level, MD at [facility] ENDO   ? ESOPHAGOGASTRODUODENOSCOPY WITH BIOPSY - FLEXIBLE N/A 05/18/2020    Performed by Lenor Derrick, MD at [facility] ENDO   ? ESOPHAGOGASTRODUODENOSCOPY WITH CONTROL OF BLEEDING - FLEXIBLE N/A 05/18/2020    Performed by Lenor Derrick, MD at [facility] ENDO   ? CHOLECYSTECTOMY     ? COLONOSCOPY     ? ESOPHAGOGASTRIC FUNDOPLICATION  2003, 2004    laparoscopic   ? ESOPHAGOGASTRIC FUNDOPLICATION     ? HX HYSTERECTOMY     ? PR ESOPHAGOSCOPY FLEXIBLE TRANSORAL DIAGNOSTIC        Social History     Tobacco Use   ? Smoking status: Never Smoker   ? Smokeless tobacco: Never Used   Substance Use Topics   ? Alcohol use: No      Family History   Problem Relation Age of Onset   ? Other Mother    ? Kidney Failure Mother    ? Osteoporosis Mother    ? Cancer Father         esophageal   ? Liver Disease Sister         endstage, s/p liver transplant   ? Cancer Brother  tonsil, liver    ? Hip Fracture Neg Hx       Medications Prior to Admission   Medication Sig Dispense Refill Last Dose   ? ciprofloxacin (CIPRO) 500 mg tablet Take one tablet by mouth daily. 30 tablet 1    ? duloxetine DR (CYMBALTA) 60 mg capsule Take 60 mg by mouth daily.      ? furosemide (LASIX) 40 mg tablet Take one-half tablet by mouth every morning. 90 tablet 0    ? lidocaine (ASPERCREME PATCH) 4 % topical patch Apply one each topically to affected area daily. 5 each 1    ? midodrine (PROAMITINE) 10 mg tablet Take 1.5 tablets by mouth three times daily. 135 tablet 3    ? oxyCODONE (ROXICODONE) 5 mg tablet Take one tablet by mouth every 6 hours as needed 20 tablet 0    ? pantoprazole DR (PROTONIX) 40 mg tablet TAKE 1 TABLET BY MOUTH TWICE A DAY 180 tablet 1    ? polyethylene glycol 3350 (MIRALAX) 17 g packet Take one packet by mouth twice daily.      ? promethazine (PHENERGAN) 25 mg tablet Take 25 mg by mouth every 4 hours as needed. Just uses it with Tramadol      ? rifAXIMin (XIFAXAN) 550 mg tablet Take one tablet by mouth twice daily. 180 tablet 3    ? zinc sulfate 220 mg (50 mg elemental zinc) capsule Take one capsule by mouth daily. 90 capsule 3      Allergies   Allergen Reactions   ? Tegaderm BLISTERS   ? Adhesive Tape (Rosins) RASH     Other reaction(s): irritated skin   ? Sulfa (Sulfonamide Antibiotics) RASH     Other reaction(s): Unknown Reaction   ? Codeine NAUSEA AND VOMITING   ? Morphine SEE COMMENTS     Pt reports burns her veins  Other reaction(s): Unknown Reaction   ? Penicillin G SEE COMMENTS     Throat issues, blisters in throat    ? Tramadol NAUSEA ONLY and ITCHING     Takes this medication regularly        Review of Systems  A comprehensive review of systems was negative.    Previous Personal Anesthetic/Sedation History:  Denies adverse events related to sedation/anesthesia.     Previous Family Anesthetic/Sedation History: Denies adverse events related to sedation/anesthesia.    Physical Exam:  Vital Signs: Last Filed In 24 Hours Vital Signs: 24 Hour Range   BP: 116/60 (07/30 0817)  Pulse: 92 (07/30 0817)  Respirations: 28 PER MINUTE (07/30 0817)  SpO2: 100 % (07/30 0817)  SpO2 Pulse: 92 (07/30 0817) BP: (116)/(60)   Pulse:  [92]   Respirations:  [28 PER MINUTE]   SpO2:  [100 %]           General appearance: Alert and no distress noted.  Neurologic: Grossly normal.  Lungs: Non labored at rest.  Heart: Regular rate and rhythm  Abdomen: Non-distended    Airway: airway assessment performed  Mallampati II (soft palate, uvula, fauces visible)  Head and Neck: no abnormalities noted  Mouth: no abnormalities noted  NPO status: Acceptable  Pregnancy Status: Not Pregnant  Anesthesia Classification:  ASA II (A normal patient with mild systemic disease)  Pre-operative anxiolysis Plan: N/A  Sedation/Medication Plan: Lidocaine  Discussion/Reviews:  Physician has discussed risks and alternatives of this type of sedation and above planned procedures with patient    Lab/Radiology/Other Diagnostic Tests:  Labs:  Pertinent labs reviewed  Pollyann Kennedy, APRN-NP  Pager [phone]

## 2020-06-17 NOTE — Patient Instructions
PARACENTESIS   A paracentesis is the removal of an abnormal buildup of fluid in your abdominal cavity. This fluid buildup is called ascites and may be caused by conditions such as liver disease, heart failure, or cancer. During this procedure, a needle is inserted into your abdomen to drain the fluid. The fluid may then be sent to the lab for testing if medically indicated. Removal of the fluid may also relieve belly pressure and shortness of breath caused by the ascites.?  POST-PROCEDURE PAIN:   ? Pain control following your procedure is a priority for both you and your Physicians.  ? Some soreness or tenderness at the site is to be expected for several days. We recommend taking over the counter analgesics to help relieve this pain.  ? Alternative methods for pain relief include but not limited to heat or cold compress, relaxation techniques, rest, and changing of positions.  ? If pain continues after 5-7 days or you have severe pain not relieved by medication, please contact us as directed below.?  POST-PROCEDURE ACTIVITY:   ? A responsible adult must drive you home.  ? If you receive sedation, narcotic pain medication or anesthesia for the procedure, you should not drive or operate heavy machinery or do anything that requires concentration for at least 24 hours after procedure completion.  ? It is recommended that a responsible adult be with you until morning.?  POST-PROCEDURE SITE CARE:   ? You will have a small bandage over the procedure site. Keep this dry.  ? You may remove it in 24 hours.  ? You may shower in 24 hours, after removing the bandage.  ? A dry gauze bandage may be reapplied as necessary to protect your clothing as the site may sometimes leak for several days after the procedure.  ? Do not submerge the procedure site for 1 week (no bathtub, swimming, hot tub, etc.)  ? Do not use ointments, creams or powders on the puncture site.  ? Be sure your hands are clean when touching near the site.? DIET/MEDICATIONS:   ? You may resume your previous diet after the procedure.  ? If you receive sedation or narcotic pain medications, avoid any foods or beverages containing alcohol for at least 24 hours after the procedure.  ? Please see the Medication Reconciliation sheet for instructions regarding resuming your home medications.?  CALL THE DOCTOR IF:   ? Bright red blood soaks the bandage.  ? You have pain not relieved by medication. Some soreness at the site is to be expected.  ? You have signs of infection such as: fever greater than 101F, chills, redness, warmth, swelling, drainage or pus from the puncture site.  For any of the above symptoms or for problems or concerns related to the procedure performed at the [place] Location, call [phone] Monday-Friday from 7-5p. After-hours and weekends, please call [phone] and ask for the Interventional Radiology Resident on-call.   You or your caregiver should call 911 for any severe symptoms such as excessive bleeding, severe dizziness, trouble breathing or loss of consciousness.   ?  ?

## 2020-06-17 NOTE — Progress Notes
Pt verbalized understanding of discharge. Pt educated on incisional care. All questions answered. Pt's belongings sent with pt. All vital signs stable, discharge criteria met. Pt transported via wheelchair to next appointment with IR tech and pt husband.

## 2020-06-17 NOTE — Progress Notes
Patient arrived to HF infusion for 1 unit PRBC transfusion as ordered by Coralee North, APRN. Per Shanda Bumps, she would like to be notified for any BP <90/50.    1115: Starting BP 97/42. Beverely Risen, RN working with [employer] notified. No new orders at this time, OK to proceed with transfusion.     1246: BP 97/44, patient asymptomatic. Tara notified. No new orders, OK to continue as long as patient remains asymptomatic.     1346: BP 98/41. Per [name], OK to proceed, no new orders at this time.     1442: Transfusion complete. Ending BP 97/42. Patient remains asymptomatic. Tara notified. No new orders. OK for patient to d/c home.

## 2020-06-19 ENCOUNTER — Encounter: Admit: 2020-06-19 | Discharge: 2020-06-19 | Payer: MEDICARE

## 2020-06-20 ENCOUNTER — Ambulatory Visit: Admit: 2020-06-20 | Discharge: 2020-06-20 | Payer: MEDICARE

## 2020-06-20 ENCOUNTER — Encounter: Admit: 2020-06-20 | Discharge: 2020-06-20 | Payer: MEDICARE

## 2020-06-20 DIAGNOSIS — R945 Abnormal results of liver function studies: Secondary | ICD-10-CM

## 2020-06-20 DIAGNOSIS — K31819 Angiodysplasia of stomach and duodenum without bleeding: Secondary | ICD-10-CM

## 2020-06-20 DIAGNOSIS — D649 Anemia, unspecified: Secondary | ICD-10-CM

## 2020-06-20 DIAGNOSIS — D500 Iron deficiency anemia secondary to blood loss (chronic): Secondary | ICD-10-CM

## 2020-06-20 LAB — COMPREHENSIVE METABOLIC PANEL
Lab: 1.2 mg/dL — ABNORMAL HIGH (ref 0.4–1.00)
Lab: 1.7 mg/dL — ABNORMAL HIGH (ref 0.3–1.2)
Lab: 102 MMOL/L — ABNORMAL LOW (ref 98–110)
Lab: 132 MMOL/L — ABNORMAL LOW (ref 137–147)
Lab: 132 mg/dL — ABNORMAL HIGH (ref 70–100)
Lab: 152 U/L — ABNORMAL HIGH (ref 25–110)
Lab: 2.9 g/dL — ABNORMAL LOW (ref 3.5–5.0)
Lab: 21 U/L — ABNORMAL HIGH (ref 7–56)
Lab: 22 MMOL/L — ABNORMAL LOW (ref 21–30)
Lab: 35 mg/dL — ABNORMAL HIGH (ref 7–25)
Lab: 4.7 MMOL/L — ABNORMAL LOW (ref 3.5–5.1)
Lab: 43 mL/min — ABNORMAL LOW (ref >60)
Lab: 5.5 g/dL — ABNORMAL LOW (ref 6.0–8.0)
Lab: 50 U/L — ABNORMAL HIGH (ref 7–40)
Lab: 52 mL/min — ABNORMAL LOW (ref >60)
Lab: 8 10*3/uL (ref 3–12)
Lab: 8.4 mg/dL — ABNORMAL LOW (ref 8.5–10.6)

## 2020-06-20 LAB — CBC AND DIFF
Lab: 24 % — ABNORMAL LOW (ref 36–45)
Lab: 3 M/UL — ABNORMAL LOW (ref 4.0–5.0)
Lab: 8.1 10*3/uL (ref 4.5–11.0)

## 2020-06-20 LAB — PROTIME INR (PT): Lab: 1.5 g/dL — ABNORMAL HIGH (ref 0.8–1.2)

## 2020-06-22 ENCOUNTER — Encounter: Admit: 2020-06-22 | Discharge: 2020-06-22 | Payer: MEDICARE

## 2020-06-22 ENCOUNTER — Ambulatory Visit: Admit: 2020-06-22 | Discharge: 2020-06-23 | Payer: MEDICARE

## 2020-06-22 DIAGNOSIS — K760 Fatty (change of) liver, not elsewhere classified: Secondary | ICD-10-CM

## 2020-06-22 DIAGNOSIS — Z780 Asymptomatic menopausal state: Secondary | ICD-10-CM

## 2020-06-22 DIAGNOSIS — E119 Type 2 diabetes mellitus without complications: Secondary | ICD-10-CM

## 2020-06-22 DIAGNOSIS — J329 Chronic sinusitis, unspecified: Secondary | ICD-10-CM

## 2020-06-22 DIAGNOSIS — K7460 Unspecified cirrhosis of liver: Secondary | ICD-10-CM

## 2020-06-22 DIAGNOSIS — I729 Aneurysm of unspecified site: Secondary | ICD-10-CM

## 2020-06-22 DIAGNOSIS — I10 Essential (primary) hypertension: Secondary | ICD-10-CM

## 2020-06-22 DIAGNOSIS — Z01810 Encounter for preprocedural cardiovascular examination: Secondary | ICD-10-CM

## 2020-06-22 DIAGNOSIS — M818 Other osteoporosis without current pathological fracture: Secondary | ICD-10-CM

## 2020-06-22 DIAGNOSIS — M810 Age-related osteoporosis without current pathological fracture: Secondary | ICD-10-CM

## 2020-06-22 DIAGNOSIS — E669 Obesity, unspecified: Secondary | ICD-10-CM

## 2020-06-22 DIAGNOSIS — K5730 Diverticulosis of large intestine without perforation or abscess without bleeding: Secondary | ICD-10-CM

## 2020-06-22 DIAGNOSIS — R0600 Dyspnea, unspecified: Secondary | ICD-10-CM

## 2020-06-22 NOTE — Telephone Encounter
Called pt to notify of dexa due  She verbalized understanding  Scheduled her for 1:20 dexa today prior to appt

## 2020-06-23 ENCOUNTER — Ambulatory Visit: Admit: 2020-06-23 | Discharge: 2020-06-23 | Payer: MEDICARE

## 2020-06-23 ENCOUNTER — Encounter: Admit: 2020-06-23 | Discharge: 2020-06-23 | Payer: MEDICARE

## 2020-06-23 DIAGNOSIS — E669 Obesity, unspecified: Secondary | ICD-10-CM

## 2020-06-23 DIAGNOSIS — K7469 Other cirrhosis of liver: Secondary | ICD-10-CM

## 2020-06-23 DIAGNOSIS — K760 Fatty (change of) liver, not elsewhere classified: Secondary | ICD-10-CM

## 2020-06-23 DIAGNOSIS — D500 Iron deficiency anemia secondary to blood loss (chronic): Secondary | ICD-10-CM

## 2020-06-23 DIAGNOSIS — I729 Aneurysm of unspecified site: Secondary | ICD-10-CM

## 2020-06-23 DIAGNOSIS — K7460 Unspecified cirrhosis of liver: Secondary | ICD-10-CM

## 2020-06-23 DIAGNOSIS — K5730 Diverticulosis of large intestine without perforation or abscess without bleeding: Secondary | ICD-10-CM

## 2020-06-23 DIAGNOSIS — Z01810 Encounter for preprocedural cardiovascular examination: Secondary | ICD-10-CM

## 2020-06-23 DIAGNOSIS — K31819 Angiodysplasia of stomach and duodenum without bleeding: Secondary | ICD-10-CM

## 2020-06-23 DIAGNOSIS — E119 Type 2 diabetes mellitus without complications: Secondary | ICD-10-CM

## 2020-06-23 DIAGNOSIS — D649 Anemia, unspecified: Secondary | ICD-10-CM

## 2020-06-23 DIAGNOSIS — J329 Chronic sinusitis, unspecified: Secondary | ICD-10-CM

## 2020-06-23 DIAGNOSIS — R945 Abnormal results of liver function studies: Secondary | ICD-10-CM

## 2020-06-23 DIAGNOSIS — I10 Essential (primary) hypertension: Secondary | ICD-10-CM

## 2020-06-23 DIAGNOSIS — R0600 Dyspnea, unspecified: Secondary | ICD-10-CM

## 2020-06-23 LAB — PARATHYROID HORMONE: Lab: 155 pg/mL — ABNORMAL HIGH (ref 10–65)

## 2020-06-23 LAB — CBC AND DIFF
Lab: 10 % — ABNORMAL LOW (ref 24–44)
Lab: 2.6 M/UL — ABNORMAL LOW (ref 4.0–5.0)
Lab: 21 % — ABNORMAL LOW (ref 36–45)
Lab: 22 % — ABNORMAL HIGH (ref 11–15)
Lab: 25 pg — ABNORMAL LOW (ref 26–34)
Lab: 278 10*3/uL (ref 150–400)
Lab: 31 g/dL — ABNORMAL LOW (ref 32.0–36.0)
Lab: 6.8 g/dL — ABNORMAL LOW (ref 12.0–15.0)
Lab: 7.4 10*3/uL (ref 4.5–11.0)
Lab: 74 % (ref 41–77)
Lab: 8.5 FL (ref 7–11)

## 2020-06-23 LAB — GRAM STAIN

## 2020-06-23 LAB — COMPREHENSIVE METABOLIC PANEL
Lab: 102 MMOL/L (ref 98–110)
Lab: 131 MMOL/L — ABNORMAL LOW (ref 137–147)
Lab: 149 mg/dL — ABNORMAL HIGH (ref 70–100)
Lab: 4.8 MMOL/L (ref 3.5–5.1)

## 2020-06-23 LAB — PROTIME INR (PT): Lab: 1.5 FL — ABNORMAL HIGH (ref 0.8–1.2)

## 2020-06-23 LAB — 25-OH VITAMIN D (D2 + D3): Lab: 27 ng/mL — ABNORMAL LOW (ref 30–80)

## 2020-06-23 MED ORDER — ALBUMIN, HUMAN 25 % IV SOLP
0 refills | Status: CP
Start: 2020-06-23 — End: ?
  Administered 2020-06-23: 14:00:00 37.5 g via INTRAVENOUS

## 2020-06-23 MED ORDER — PROMETHAZINE 25 MG PO TAB
25 mg | Freq: Once | ORAL | 0 refills | Status: DC
Start: 2020-06-23 — End: 2020-06-23

## 2020-06-23 NOTE — Other
Immediate Post Procedure Note    Date:  06/23/2020                                         Attending Physician:   Dr. Laural Benes  Performing Provider:  Earlean Shawl, MD    Consent:  Consent obtained from patient.  Time out performed: Consent obtained, correct patient verified, correct procedure verified, correct site verified, patient marked as necessary.  Pre/Post Procedure Diagnosis:  Paracentesis  Indications:  Ascites      Procedure(s):  Paracentesis  Findings:  Clear, yellow colored fluid     Estimated Blood Loss:  None/Negligible  Specimen(s) Removed/Disposition:  Yes, sent to pathology  Complications: None  Patient Tolerated Procedure: Well  Post-Procedure Condition:  stable    Earlean Shawl, MD

## 2020-06-23 NOTE — Telephone Encounter
patient notified hgb 6.8. arranged for 2 units of PRBCs tomorrow in infusion center. Intstructed patient to arrive at 0800. confirmed with Noreene Larsson in infusion center. Patient v/u

## 2020-06-23 NOTE — H&P (View-Only)
IR Pre-Procedure History and Physical/Sedation Plan    Procedure Date: 06/23/2020     Planned Procedure(s):  Ultrasound-guided paracentesis     Indication:  Fluid testing; Therapeutic drainage  __________________________________________________________________    Chief Complaint:  Ascites    History of Present Illness: Kristin Moody is a 64 y.o. female with a history as listed below who presents today for procedure.    Patient Active Problem List    Diagnosis Date Noted   ? Pleurodynia 06/04/2020   ? Pneumothorax 06/04/2020   ? 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 Hypotension 02/10/2020   ? 'Light-for-dates' infant with signs of fetal malnutrition 01/22/2020   ? 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 Anemia 09/28/2019   ? Encephalopathy 08/12/2019   ? Spasticity 08/10/2019   ? Hyperreflexia 08/10/2019   ? 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 Right leg weakness 05/14/2019   ? 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 Iron (Fe) deficiency anemia 01/19/2019   ? Preop cardiovascular exam 01/07/2019   ? Pre-transplant evaluation for liver transplant 01/06/2019   ? Cirrhosis (HCC) 12/16/2018   ? Acute on chronic anemia 12/16/2018   ? Lactic acidosis 12/16/2018   ? GI bleed 10/24/2018   ? Portal hypertension (HCC) 08/28/2018   ? Confusion 08/27/2018   ? Dyspnea 08/27/2018   ? Chronic abdominal pain 08/27/2018   ? 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 Acute on chronic blood loss anemia 08/27/2018   ? Iron deficiency anemia 04/13/2018   ? Pneumonia due to infectious organism 04/13/2018   ? Melena 04/10/2018   ? Esophageal varices without bleeding (HCC) 02/25/2018   ? 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 Lower abdominal pain 10/24/2014   ? Chest discomfort 09/21/2014   ? Essential hypertension 08/13/2012   ? Abnormal liver function tests 05/13/2011   ? Fatty liver disease, nonalcoholic 05/13/2011   ? Obesity (BMI 30-39.9) 05/13/2011   ? Slow transit constipation 05/13/2011     Medical History:   Diagnosis Date   ? Aneurysm (HCC)    ? 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 Diverticulosis of colon     descending and sigmoid colon   ? Dyspnea    ? Essential hypertension 08/13/2012   ? Fatty infiltration of liver    ? HTN (hypertension)    ? Obesity (BMI 30-39.9) 05/13/2011   ? Preop cardiovascular exam 01/07/2019   ? Sinus infection    ? Type 2 diabetes mellitus (HCC) 09/21/2014      Surgical History:   Procedure Laterality Date   ? HYSTERECTOMY  1994   ? UPPER GASTROINTESTINAL ENDOSCOPY  2009   ? COLONOSCOPY  2009   ? CYSTOCELE REPAIR  2009    with endocele repair   ? RECTOCELE REPAIR  03/2009   ? LIVER BIOPSY  07/17/2010   ? COLONOSCOPY N/A 11/08/2017    Performed by Dawna Part, MD at [facility] ENDO   ? ESOPHAGOGASTRODUODENOSCOPY N/A 11/08/2017    Performed by Dawna Part, MD at [facility] ENDO   ? ESOPHAGOGASTRODUODENOSCOPY WITH BIOPSY - FLEXIBLE  11/08/2017    Performed by Dawna Part, MD at [facility] ENDO   ? COLONOSCOPY WITH HOT BIOPSY FORCEPS REMOVAL TUMOR/ POLYP/ OTHER LESION  11/08/2017    Performed by Dawna Part, MD at [facility] ENDO   ? ESOPHAGOGASTRODUODENOSCOPY WITH BAND LIGATION ESOPHAGEAL/ GASTRIC  VARICES - FLEXIBLE N/A 04/10/2018    Performed by Normajean Baxter, MD at [facility] ENDO   ? ESOPHAGOGASTRODUODENOSCOPY WITH BIOPSY - FLEXIBLE N/A 04/10/2018    Performed by Normajean Baxter, MD at [facility] ENDO   ? ESOPHAGOGASTRODUODENOSCOPY WITH CONTROL OF BLEEDING - FLEXIBLE N/A 04/25/2018    Performed by Jolee Ewing, MD at [facility] ENDO   ? ESOPHAGOGASTRODUODENOSCOPY WITH BIOPSY - FLEXIBLE with push enteroscopy N/A 08/28/2018    Performed by Celesta Gentile, MD at [facility] ENDO   ? EGD N/A 10/27/2018    Performed by Eliott Nine, MD at [facility] ENDO   ? ESOPHAGOGASTRODUODENOSCOPY WITH SNARE REMOVAL TUMOR/ POLYP/ OTHER LESION - FLEXIBLE N/A 10/27/2018    Performed by Eliott Nine, MD at [facility] ENDO   ? ESOPHAGOGASTRODUODENOSCOPY WITH BIOPSY - FLEXIBLE N/A 12/16/2018    Performed by Buckles, Vinnie Level, MD at [facility] ENDO   ? ESOPHAGOGASTRODUODENOSCOPY WITH CONTROL OF BLEEDING - FLEXIBLE N/A 12/16/2018    Performed by Buckles, Vinnie Level, MD at [facility] ENDO   ? ESOPHAGOGASTRODUODENOSCOPY WITH SPECIMEN COLLECTION BY BRUSHING/ WASHING N/A 01/22/2019    Performed by Veneta Penton, MD at [facility] ENDO   ? ESOPHAGOGASTRODUODENOSCOPY WITH DILATION ESOPHAGUS WITH BALLOON 30 MM OR GREATER - FLEXIBLE N/A 01/22/2019    Performed by Veneta Penton, MD at [facility] ENDO   ? ESOPHAGOGASTRODUODENOSCOPY WITH BIOPSY - FLEXIBLE N/A 01/22/2019    Performed by Veneta Penton, MD at [facility] ENDO   ? ESOPHAGOGASTRODUODENOSCOPY WITH BIOPSY - FLEXIBLE N/A 06/19/2019    Performed by Dawna Part, MD at [facility] ENDO   ? ESOPHAGOGASTRODUODENOSCOPY [WITH APC]  WITH SPECIMEN COLLECTION BY BRUSHING/ WASHING N/A 08/07/2019    Performed by Dawna Part, MD at [facility] ENDO   ? ESOPHAGOGASTRODUODENOSCOPY WITH SPECIMEN COLLECTION BY BRUSHING/ WASHING N/A 09/10/2019    Performed by Buckles, Vinnie Level, MD at [facility] ENDO   ? ESOPHAGOGASTRODUODENOSCOPY WITH CONTROL OF BLEEDING - FLEXIBLE N/A 09/10/2019    Performed by Buckles, Vinnie Level, MD at [facility] ENDO   ? ESOPHAGOGASTRODUODENOSCOPY WITH CONTROL OF BLEEDING - FLEXIBLE N/A 02/12/2020    Performed by Lenor Derrick, MD at [facility] ENDO   ? ESOPHAGOGASTRODUODENOSCOPY WITH BIOPSY - FLEXIBLE N/A 02/26/2020    Performed by Buckles, Vinnie Level, MD at [facility] ENDO   ? ESOPHAGOGASTRODUODENOSCOPY WITH BIOPSY - FLEXIBLE N/A 05/18/2020    Performed by Lenor Derrick, MD at [facility] ENDO   ? ESOPHAGOGASTRODUODENOSCOPY WITH CONTROL OF BLEEDING - FLEXIBLE N/A 05/18/2020    Performed by Lenor Derrick, MD at [facility] ENDO   ? CHOLECYSTECTOMY     ? COLONOSCOPY     ? ESOPHAGOGASTRIC FUNDOPLICATION  2003, 2004    laparoscopic   ? ESOPHAGOGASTRIC FUNDOPLICATION     ? HX HYSTERECTOMY     ? PR ESOPHAGOSCOPY FLEXIBLE TRANSORAL DIAGNOSTIC        Social History     Tobacco Use   ? Smoking status: Never Smoker   ? Smokeless tobacco: Never Used Substance Use Topics   ? Alcohol use: No      Family History   Problem Relation Age of Onset   ? Other Mother    ? Kidney Failure Mother    ? Osteoporosis Mother    ? Cancer Father         esophageal   ? Liver Disease Sister         endstage, s/p liver transplant   ? Cancer Brother         tonsil, liver    ?  Hip Fracture Neg Hx       Medications Prior to Admission   Medication Sig Dispense Refill Last Dose   ? ciprofloxacin (CIPRO) 500 mg tablet Take one tablet by mouth daily. 30 tablet 1    ? duloxetine DR (CYMBALTA) 60 mg capsule Take 60 mg by mouth daily.      ? furosemide (LASIX) 40 mg tablet Take one-half tablet by mouth every morning. 90 tablet 0    ? lidocaine (ASPERCREME PATCH) 4 % topical patch Apply one each topically to affected area daily. 5 each 1    ? midodrine (PROAMITINE) 10 mg tablet Take 1.5 tablets by mouth three times daily. 135 tablet 3    ? oxyCODONE (ROXICODONE) 5 mg tablet Take one tablet by mouth every 6 hours as needed 20 tablet 0    ? pantoprazole DR (PROTONIX) 40 mg tablet TAKE 1 TABLET BY MOUTH TWICE A DAY 180 tablet 1    ? polyethylene glycol 3350 (MIRALAX) 17 g packet Take one packet by mouth twice daily.      ? promethazine (PHENERGAN) 25 mg tablet Take 25 mg by mouth every 4 hours as needed. Just uses it with Tramadol      ? rifAXIMin (XIFAXAN) 550 mg tablet Take one tablet by mouth twice daily. 180 tablet 3    ? zinc sulfate 220 mg (50 mg elemental zinc) capsule Take one capsule by mouth daily. 90 capsule 3      Allergies   Allergen Reactions   ? Tegaderm BLISTERS   ? Adhesive Tape (Rosins) RASH     Other reaction(s): irritated skin   ? Sulfa (Sulfonamide Antibiotics) RASH     Other reaction(s): Unknown Reaction   ? Codeine NAUSEA AND VOMITING   ? Morphine SEE COMMENTS     Pt reports burns her veins  Other reaction(s): Unknown Reaction   ? Penicillin G SEE COMMENTS     Throat issues, blisters in throat    ? Tramadol NAUSEA ONLY and ITCHING     Takes this medication regularly Review of Systems  A comprehensive review of systems was negative.    Physical Exam:  Vital Signs: Last Filed In 24 Hours Vital Signs: 24 Hour Range   BP: 103/59 (08/04 1310)  Pulse: 91 (08/04 1310)  Height: 157.5 cm (62) (08/04 1310) BP: (103)/(59)   Pulse:  [91]           General appearance: alert and no distress noted.  Neurologic: Grossly normal.  Lungs: Non labored.  Heart: regular rate and rhythm  Abdomen: distended    Pre-procedure anxiolysis plan: N/A  Sedation/Medication Plan: Local anesthetic  Personal history of sedation complications: Denies adverse event.   Family history of sedation complications: Denies adverse event.   Medications for Reversal: NA  Discussion/Reviews:  Physician has discussed risks and alternatives of this type of sedation and above planned procedures with patient    NPO Status: NA  Airway:  NA  Head and Neck: NA  Mouth: NA   Anesthesia Classification:  ASA III (A patient with a severe systemic disease that limits activity, but is not incapacitating)  Pregnancy Status: N/A    Lab/Radiology/Other Diagnostic Tests:  Labs:  Pertinent labs reviewed           Gilverto Dileonardo P Divine-Thiele, APRN-NP  Pager [phone]

## 2020-06-24 ENCOUNTER — Encounter: Admit: 2020-06-24 | Discharge: 2020-06-24 | Payer: MEDICARE

## 2020-06-24 DIAGNOSIS — M810 Age-related osteoporosis without current pathological fracture: Secondary | ICD-10-CM

## 2020-06-24 NOTE — Progress Notes
Patient presents for blood transfusion.  BP is 105/37, patient feels alright.  Notified Coralee North, APRN, due to BP below set parameters of 90/50.  Per Shanda Bumps, no diastolic parameters going forward and only need to notify provider of patient's post transfusion BP.

## 2020-06-24 NOTE — Patient Instructions
[facility]  OUTPATIENT POST TRANSFUSION INSTRUCTIONS     During your transfusion you were monitored by nursing staff for signs of a transfusion reaction, however, you should continue to observe for the following symptoms after you have been dismissed from the clinic:     FEVER OF 100.5 OR HIGHER  CHILLS  NAUSEA OR VOMITING  FEELING FAINT OR DIZZY  SHORTNESS OF BREATH  DARK OR RED COLORED URINE  CHEST OR BACK PAIN  SHOCK OR LOSS OF CONSCIOUSNESS  YELLOWING OF THE EYES OR SKIN     If you or your caregiver notice any of these symptoms, contact your ordering physician immediately and go to the nearest Emergency Department for treatment. When you arrive at the Emergency Department notify them that you recently received a blood transfusion. *For more detailed signs and symptoms of a transfusion reaction, please refer to the [place] education information below.          When You Need a Blood Transfusion (Adult)  A blood transfusion may be done when you have lost blood because of an injury or during surgery. It can also be done because of diseases or conditions that affect the blood. Blood is made up of several different parts (blood products). You may receive some or all of these blood products during a transfusion. Blood for transfusion is usually donated from another person (donor). Strict measures are taken to make sure that donated blood is safe before it's given to you. This sheet helps you understand how a blood transfusion is done. Your healthcare provider will discuss your condition with you and answer your questions.   The parts of blood  Blood can be broken down into different parts that perform special roles in the body. These parts include:  ? Red blood cells, which carry oxygen throughout the body.  ? Platelets, which help stop bleeding.  ? Plasma (the liquid part of blood), which carries red blood cells and platelets throughout the body. Plasma also helps platelets in stopping bleeding. Where does donated blood come from?  ? Volunteer donors. These are people who donate their blood to help others in need of blood. Blood donation can take place at several places, including a hospital, blood bank, or during a blood drive.  ? Directed donation. If you need a blood transfusion during a planned surgery, family and friends can have their blood tested for compatibility and donate blood for you before the surgery. This needs to be done at least 7 day(s) in advance. This is because the blood must be tested for safety.  ? Autologous donation. This is also called self-donation. For planned surgery, you can donate your own blood starting up to 6 weeks before surgery.  Are blood transfusions safe?  Donated blood is tested and processed to make sure that the blood is safe:  ? The health and medical history of each donor is carefully screened. If a person is considered high-risk for infection or problems, he or she isn't accepted as a blood donor.  ? Donated blood is tested for infections such as hepatitis, syphilis, and HIV (the virus that causes AIDS). If the tested blood is found to be unsafe, it's destroyed.  ? Blood is divided into four types: A, B, AB, and O. Blood also has Rh types: positive (+) and negative (-). You can only receive blood products that are compatible with (match) your blood type. A sample of your blood is tested for compatibility with donated blood. This is  done before blood products are prepared for a transfusion.  How is a blood transfusion done?  A blood transfusion takes place in a blood center, infusion center, hospital room, or operating room. Your healthcare provider will discuss the blood transfusion with you before it's done. You'll need to give permission for the blood transfusion by signing a consent form.  ? Two healthcare providers confirm your identity. They also confirm that they have the correct blood product(s) for you.  ? An intravenous (IV) line is placed in a vein if you do not already have an IV.  ? The blood product comes in a plastic bag that is hung on an IV pole. The blood product flows from the bag into your IV line. The IV line may be connected to a pump, which controls the transfusion rate. You may receive more than one kind of blood product through the IV.  ? Your vital signs (blood pressure, heart rate, respiratory rate, and temperature) are checked throughout the transfusion. This is to make sure you are not having a reaction to the blood product.  ? The IV line may be removed once the transfusion is complete.  Possible risks and complications of blood transfusions  Most transfusions are problem free. In some cases, reactions occur. These can happen within seconds to minutes during the transfusion or a week to a few months after the transfusion. Call your doctor or nurse right away if you have any of the signs or symptoms in the table below during or after a transfusion:  Reaction Timing Symptoms   Allergic reaction (mild) ? Within seconds to minutes during the transfusion  ? Up to 24 hours after the transfusion Hives or red welts on the skin, mild itching, rash, localized swelling, flushing (red face), wheezing, shortness of breath, or stridor (high-pitched noise or sound)   Anaphylactic reaction ? Within seconds to minutes during the transfusion  ? Up to 24 hours after the transfusion Shortness of breath, flushing (red face), wheezing, labored (working hard) breathing, low blood pressure, localized swelling, chest tightness, or cramps   Febrile nonhemolytic reaction ? Within minutes to hours during the transfusion  ? Within a few hours to 24 hours after the transfusion Fever (increase of 1? C or higher), chills, flushing (red face), nausea, headache, minor discomfort, or mild shortness of breath   Acute immune hemolytic reaction ? Within minutes during the transfusion  ? Up to 24 hours after the transfusion Fever, red or brown urine, back pain, fast heart rate (tachycardia), abdominal pain, low blood pressure, feeling anxious, chills, chest pain, nausea, or fainting spells   Transfusion-related acute lung injury (TRALI) ? Within 1 to 2 hours during the transfusion  ? Up to 6 hours after the transfusion Shortness of breath, trouble breathing, low blood pressure, fever, pulmonary edema   Transfusion-associated circulatory overload ? Near the end of the transfusion  ? Within 6 hours after the transfusion Shortness of breath, fast heart rate (tachycardia), problems breathing when lying on back, abnormal blood pressure   Post-transfusion purpura (PUP) ? Within 1 week  ? Up to 48 days after the transfusion Purple spots on skin; nose bleed; bleeding from the urinary tract, abdomen, colon, or rectum; fever; or chills     Delayed transfusion-related acute lung injury (TRALI) ? Within 72 hours (3 days) after the transfusion Sudden onset of respiratory distress or trouble breathing   Delayed hemolytic reaction ? Within 3 to 7 days  ? Up to weeks after the  transfusion Low-grade fever, mild jaundice (yellowing of the skin and whites of the eyes), decrease in hematocrit, chills, chest pain, back pain, nausea   ? 2000-2019 [employer], [place]. [address] [place], [place] [address]. All rights reserved. This information is not intended as a substitute for professional medical care. Always follow your healthcare professional's instructions.

## 2020-06-25 ENCOUNTER — Ambulatory Visit: Admit: 2020-06-24 | Discharge: 2020-06-25 | Payer: MEDICARE

## 2020-06-25 DIAGNOSIS — K31819 Angiodysplasia of stomach and duodenum without bleeding: Secondary | ICD-10-CM

## 2020-06-27 ENCOUNTER — Encounter: Admit: 2020-06-27 | Discharge: 2020-06-27 | Payer: MEDICARE

## 2020-06-27 ENCOUNTER — Ambulatory Visit: Admit: 2020-06-27 | Discharge: 2020-06-27 | Payer: MEDICARE

## 2020-06-27 DIAGNOSIS — J329 Chronic sinusitis, unspecified: Secondary | ICD-10-CM

## 2020-06-27 DIAGNOSIS — K5730 Diverticulosis of large intestine without perforation or abscess without bleeding: Secondary | ICD-10-CM

## 2020-06-27 DIAGNOSIS — Z20822 Encounter for screening laboratory testing for COVID-19 virus in asymptomatic patient: Secondary | ICD-10-CM

## 2020-06-27 DIAGNOSIS — I729 Aneurysm of unspecified site: Secondary | ICD-10-CM

## 2020-06-27 DIAGNOSIS — E119 Type 2 diabetes mellitus without complications: Secondary | ICD-10-CM

## 2020-06-27 DIAGNOSIS — I10 Essential (primary) hypertension: Secondary | ICD-10-CM

## 2020-06-27 DIAGNOSIS — S62347D Nondisplaced fracture of base of fifth metacarpal bone. left hand, subsequent encounter for fracture with routine healing: Secondary | ICD-10-CM

## 2020-06-27 DIAGNOSIS — Z01810 Encounter for preprocedural cardiovascular examination: Secondary | ICD-10-CM

## 2020-06-27 DIAGNOSIS — K7460 Unspecified cirrhosis of liver: Secondary | ICD-10-CM

## 2020-06-27 DIAGNOSIS — E669 Obesity, unspecified: Secondary | ICD-10-CM

## 2020-06-27 DIAGNOSIS — K760 Fatty (change of) liver, not elsewhere classified: Secondary | ICD-10-CM

## 2020-06-27 DIAGNOSIS — R0600 Dyspnea, unspecified: Secondary | ICD-10-CM

## 2020-06-28 MED ORDER — MIDODRINE 10 MG PO TAB
ORAL_TABLET | Freq: Three times a day (TID) | 1 refills | Status: AC
Start: 2020-06-28 — End: ?

## 2020-06-29 ENCOUNTER — Ambulatory Visit: Admit: 2020-06-29 | Discharge: 2020-06-29 | Payer: MEDICARE

## 2020-06-29 ENCOUNTER — Encounter: Admit: 2020-06-29 | Discharge: 2020-06-29 | Payer: MEDICARE

## 2020-06-29 DIAGNOSIS — E669 Obesity, unspecified: Secondary | ICD-10-CM

## 2020-06-29 DIAGNOSIS — K5730 Diverticulosis of large intestine without perforation or abscess without bleeding: Secondary | ICD-10-CM

## 2020-06-29 DIAGNOSIS — S62347D Nondisplaced fracture of base of fifth metacarpal bone. left hand, subsequent encounter for fracture with routine healing: Secondary | ICD-10-CM

## 2020-06-29 DIAGNOSIS — K760 Fatty (change of) liver, not elsewhere classified: Secondary | ICD-10-CM

## 2020-06-29 DIAGNOSIS — K7460 Unspecified cirrhosis of liver: Secondary | ICD-10-CM

## 2020-06-29 DIAGNOSIS — I10 Essential (primary) hypertension: Secondary | ICD-10-CM

## 2020-06-29 DIAGNOSIS — J329 Chronic sinusitis, unspecified: Secondary | ICD-10-CM

## 2020-06-29 DIAGNOSIS — Z01810 Encounter for preprocedural cardiovascular examination: Secondary | ICD-10-CM

## 2020-06-29 DIAGNOSIS — R0600 Dyspnea, unspecified: Secondary | ICD-10-CM

## 2020-06-29 DIAGNOSIS — I729 Aneurysm of unspecified site: Secondary | ICD-10-CM

## 2020-06-29 DIAGNOSIS — E119 Type 2 diabetes mellitus without complications: Secondary | ICD-10-CM

## 2020-06-30 ENCOUNTER — Encounter: Admit: 2020-06-30 | Discharge: 2020-06-30 | Payer: MEDICARE

## 2020-06-30 ENCOUNTER — Ambulatory Visit: Admit: 2020-06-30 | Discharge: 2020-06-30 | Payer: MEDICARE

## 2020-06-30 DIAGNOSIS — D649 Anemia, unspecified: Secondary | ICD-10-CM

## 2020-06-30 DIAGNOSIS — K760 Fatty (change of) liver, not elsewhere classified: Secondary | ICD-10-CM

## 2020-06-30 DIAGNOSIS — K7460 Unspecified cirrhosis of liver: Secondary | ICD-10-CM

## 2020-06-30 DIAGNOSIS — K5730 Diverticulosis of large intestine without perforation or abscess without bleeding: Secondary | ICD-10-CM

## 2020-06-30 DIAGNOSIS — K31819 Angiodysplasia of stomach and duodenum without bleeding: Secondary | ICD-10-CM

## 2020-06-30 DIAGNOSIS — J329 Chronic sinusitis, unspecified: Secondary | ICD-10-CM

## 2020-06-30 DIAGNOSIS — K7469 Other cirrhosis of liver: Secondary | ICD-10-CM

## 2020-06-30 DIAGNOSIS — I10 Essential (primary) hypertension: Secondary | ICD-10-CM

## 2020-06-30 DIAGNOSIS — E119 Type 2 diabetes mellitus without complications: Secondary | ICD-10-CM

## 2020-06-30 DIAGNOSIS — R0600 Dyspnea, unspecified: Secondary | ICD-10-CM

## 2020-06-30 DIAGNOSIS — I729 Aneurysm of unspecified site: Secondary | ICD-10-CM

## 2020-06-30 DIAGNOSIS — E669 Obesity, unspecified: Secondary | ICD-10-CM

## 2020-06-30 DIAGNOSIS — D500 Iron deficiency anemia secondary to blood loss (chronic): Secondary | ICD-10-CM

## 2020-06-30 DIAGNOSIS — Z01810 Encounter for preprocedural cardiovascular examination: Secondary | ICD-10-CM

## 2020-06-30 LAB — COMPREHENSIVE METABOLIC PANEL
Lab: 1.4 mg/dL — ABNORMAL HIGH (ref 0.4–1.00)
Lab: 1.7 mg/dL — ABNORMAL HIGH (ref 0.3–1.2)
Lab: 102 MMOL/L (ref 98–110)
Lab: 132 MMOL/L — ABNORMAL LOW (ref 137–147)
Lab: 146 U/L — ABNORMAL HIGH (ref 25–110)
Lab: 147 mg/dL — ABNORMAL HIGH (ref 70–100)
Lab: 2.9 g/dL — ABNORMAL LOW (ref 3.5–5.0)
Lab: 22 U/L (ref 7–56)
Lab: 36 mL/min — ABNORMAL LOW (ref >60)
Lab: 4.8 MMOL/L (ref 3.5–5.1)
Lab: 44 mL/min — ABNORMAL LOW (ref >60)
Lab: 44 mg/dL — ABNORMAL HIGH (ref 7–25)
Lab: 5.8 g/dL — ABNORMAL LOW (ref 6.0–8.0)
Lab: 52 U/L — ABNORMAL HIGH (ref 7–40)
Lab: 8.6 mg/dL (ref 8.5–10.6)
Lab: 10 (ref 3–12)

## 2020-06-30 LAB — PROTIME INR (PT): Lab: 1.5 MMOL/L — ABNORMAL HIGH (ref 0.8–1.2)

## 2020-06-30 LAB — CBC AND DIFF
Lab: 0.1 10*3/uL (ref 0–0.20)
Lab: 0.1 10*3/uL (ref 0–0.45)
Lab: 0.5 10*3/uL — ABNORMAL LOW (ref 1.0–4.8)
Lab: 0.8 10*3/uL — ABNORMAL HIGH (ref 0–0.80)
Lab: 1 % (ref 0–2)
Lab: 1 % (ref 0–5)
Lab: 11 % (ref 4–12)
Lab: 2.6 M/UL — ABNORMAL LOW (ref 4.0–5.0)
Lab: 20 % — ABNORMAL HIGH (ref 11–15)
Lab: 21 % — ABNORMAL LOW (ref 36–45)
Lab: 25 pg — ABNORMAL LOW (ref 26–34)
Lab: 274 10*3/uL (ref 150–400)
Lab: 31 g/dL — ABNORMAL LOW (ref 32.0–36.0)
Lab: 5.9 10*3/uL (ref 1.8–7.0)
Lab: 6.8 g/dL — ABNORMAL LOW (ref 12.0–15.0)
Lab: 7.6 10*3/uL (ref 4.5–11.0)
Lab: 79 % — ABNORMAL HIGH (ref 41–77)
Lab: 79 FL — ABNORMAL LOW (ref 80–100)
Lab: 8 % — ABNORMAL LOW (ref 24–44)
Lab: 8.5 FL (ref 7–11)

## 2020-06-30 LAB — GRAM STAIN

## 2020-06-30 MED ORDER — ALBUMIN, HUMAN 25 % IV SOLP
0 refills | Status: CP
Start: 2020-06-30 — End: ?
  Administered 2020-06-30 (×3): 12.5 g via INTRAVENOUS

## 2020-06-30 NOTE — Other
Immediate Post Procedure Note    Date:  06/30/2020                                         Attending Physician:   Dr. Thomasena Edis  Performing Provider:  Earlean Shawl, MD    Consent:  Consent obtained from patient.  Time out performed: Consent obtained, correct patient verified, correct procedure verified, correct site verified, patient marked as necessary.  Pre/Post Procedure Diagnosis:  Paracentesis  Indications:  Ascites      Procedure(s): Paracentesis  Findings:  Clear yellow colored fluid     Estimated Blood Loss:  None/Negligible  Specimen(s) Removed/Disposition:  Yes, sent to pathology  Complications: None  Patient Tolerated Procedure: Well  Post-Procedure Condition:  stable    Earlean Shawl, MD

## 2020-06-30 NOTE — Progress Notes
Procedural education done with pt who verbalized understanding of procedure and post procedure care. All questions answered. Pt in position of comfort at this time. Safety precautions in place, call light in reach.

## 2020-06-30 NOTE — Telephone Encounter
06/30/20 Verified active MDCR and BCBS Supplement coverage via [employer]. MDCR [facility] [facility] [employer] [facility] Reference Number:  [id number]. BCBS Supplement [facility] Reference Number:  [id number]. No Auth's Required

## 2020-06-30 NOTE — H&P (View-Only)
IR Pre-Procedure History and Physical/Sedation Plan    Procedure Date: 06/30/2020     Planned Procedure(s):  Ultrasound-guided paracentesis     Indication:  Fluid testing; Therapeutic drainage  __________________________________________________________________    Chief Complaint:  Ascites    History of Present Illness: Kristin Moody is a 64 y.o. female with a history as listed below who presents today for procedure.    Patient Active Problem List    Diagnosis Date Noted   ? Pleurodynia 06/04/2020   ? Pneumothorax 06/04/2020   ? 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 Hypotension 02/10/2020   ? 'Light-for-dates' infant with signs of fetal malnutrition 01/22/2020   ? 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 Anemia 09/28/2019   ? Encephalopathy 08/12/2019   ? Spasticity 08/10/2019   ? Hyperreflexia 08/10/2019   ? 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 Right leg weakness 05/14/2019   ? 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 Iron (Fe) deficiency anemia 01/19/2019   ? Preop cardiovascular exam 01/07/2019   ? Pre-transplant evaluation for liver transplant 01/06/2019   ? Cirrhosis (HCC) 12/16/2018   ? Acute on chronic anemia 12/16/2018   ? Lactic acidosis 12/16/2018   ? GI bleed 10/24/2018   ? Portal hypertension (HCC) 08/28/2018   ? Confusion 08/27/2018   ? Dyspnea 08/27/2018   ? Chronic abdominal pain 08/27/2018   ? 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 Acute on chronic blood loss anemia 08/27/2018   ? Iron deficiency anemia 04/13/2018   ? Pneumonia due to infectious organism 04/13/2018   ? Melena 04/10/2018   ? Esophageal varices without bleeding (HCC) 02/25/2018   ? 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 Lower abdominal pain 10/24/2014   ? Chest discomfort 09/21/2014   ? Essential hypertension 08/13/2012   ? Abnormal liver function tests 05/13/2011   ? Fatty liver disease, nonalcoholic 05/13/2011   ? Obesity (BMI 30-39.9) 05/13/2011   ? Slow transit constipation 05/13/2011     Medical History:   Diagnosis Date   ? Aneurysm (HCC)    ? 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 Diverticulosis of colon     descending and sigmoid colon   ? Dyspnea    ? Essential hypertension 08/13/2012   ? Fatty infiltration of liver    ? HTN (hypertension)    ? Obesity (BMI 30-39.9) 05/13/2011   ? Preop cardiovascular exam 01/07/2019   ? Sinus infection    ? Type 2 diabetes mellitus (HCC) 09/21/2014      Surgical History:   Procedure Laterality Date   ? HYSTERECTOMY  1994   ? UPPER GASTROINTESTINAL ENDOSCOPY  2009   ? COLONOSCOPY  2009   ? CYSTOCELE REPAIR  2009    with endocele repair   ? RECTOCELE REPAIR  03/2009   ? LIVER BIOPSY  07/17/2010   ? COLONOSCOPY N/A 11/08/2017    Performed by Dawna Part, MD at [facility] ENDO   ? ESOPHAGOGASTRODUODENOSCOPY N/A 11/08/2017    Performed by Dawna Part, MD at [facility] ENDO   ? ESOPHAGOGASTRODUODENOSCOPY WITH BIOPSY - FLEXIBLE  11/08/2017    Performed by Dawna Part, MD at [facility] ENDO   ? COLONOSCOPY WITH HOT BIOPSY FORCEPS REMOVAL TUMOR/ POLYP/ OTHER LESION  11/08/2017    Performed by Dawna Part, MD at [facility] ENDO   ? ESOPHAGOGASTRODUODENOSCOPY WITH BAND LIGATION ESOPHAGEAL/ GASTRIC  VARICES - FLEXIBLE N/A 04/10/2018    Performed by Normajean Baxter, MD at [facility] ENDO   ? ESOPHAGOGASTRODUODENOSCOPY WITH BIOPSY - FLEXIBLE N/A 04/10/2018    Performed by Normajean Baxter, MD at [facility] ENDO   ? ESOPHAGOGASTRODUODENOSCOPY WITH CONTROL OF BLEEDING - FLEXIBLE N/A 04/25/2018    Performed by Jolee Ewing, MD at [facility] ENDO   ? ESOPHAGOGASTRODUODENOSCOPY WITH BIOPSY - FLEXIBLE with push enteroscopy N/A 08/28/2018    Performed by Celesta Gentile, MD at [facility] ENDO   ? EGD N/A 10/27/2018    Performed by Eliott Nine, MD at [facility] ENDO   ? ESOPHAGOGASTRODUODENOSCOPY WITH SNARE REMOVAL TUMOR/ POLYP/ OTHER LESION - FLEXIBLE N/A 10/27/2018    Performed by Eliott Nine, MD at [facility] ENDO   ? ESOPHAGOGASTRODUODENOSCOPY WITH BIOPSY - FLEXIBLE N/A 12/16/2018    Performed by Buckles, Vinnie Level, MD at [facility] ENDO   ? ESOPHAGOGASTRODUODENOSCOPY WITH CONTROL OF BLEEDING - FLEXIBLE N/A 12/16/2018    Performed by Buckles, Vinnie Level, MD at [facility] ENDO   ? ESOPHAGOGASTRODUODENOSCOPY WITH SPECIMEN COLLECTION BY BRUSHING/ WASHING N/A 01/22/2019    Performed by Veneta Penton, MD at [facility] ENDO   ? ESOPHAGOGASTRODUODENOSCOPY WITH DILATION ESOPHAGUS WITH BALLOON 30 MM OR GREATER - FLEXIBLE N/A 01/22/2019    Performed by Veneta Penton, MD at [facility] ENDO   ? ESOPHAGOGASTRODUODENOSCOPY WITH BIOPSY - FLEXIBLE N/A 01/22/2019    Performed by Veneta Penton, MD at [facility] ENDO   ? ESOPHAGOGASTRODUODENOSCOPY WITH BIOPSY - FLEXIBLE N/A 06/19/2019    Performed by Dawna Part, MD at [facility] ENDO   ? ESOPHAGOGASTRODUODENOSCOPY [WITH APC]  WITH SPECIMEN COLLECTION BY BRUSHING/ WASHING N/A 08/07/2019    Performed by Dawna Part, MD at [facility] ENDO   ? ESOPHAGOGASTRODUODENOSCOPY WITH SPECIMEN COLLECTION BY BRUSHING/ WASHING N/A 09/10/2019    Performed by Buckles, Vinnie Level, MD at [facility] ENDO   ? ESOPHAGOGASTRODUODENOSCOPY WITH CONTROL OF BLEEDING - FLEXIBLE N/A 09/10/2019    Performed by Buckles, Vinnie Level, MD at [facility] ENDO   ? ESOPHAGOGASTRODUODENOSCOPY WITH CONTROL OF BLEEDING - FLEXIBLE N/A 02/12/2020    Performed by Lenor Derrick, MD at [facility] ENDO   ? ESOPHAGOGASTRODUODENOSCOPY WITH BIOPSY - FLEXIBLE N/A 02/26/2020    Performed by Buckles, Vinnie Level, MD at [facility] ENDO   ? ESOPHAGOGASTRODUODENOSCOPY WITH BIOPSY - FLEXIBLE N/A 05/18/2020    Performed by Lenor Derrick, MD at [facility] ENDO   ? ESOPHAGOGASTRODUODENOSCOPY WITH CONTROL OF BLEEDING - FLEXIBLE N/A 05/18/2020    Performed by Lenor Derrick, MD at [facility] ENDO   ? CHOLECYSTECTOMY     ? COLONOSCOPY     ? ESOPHAGOGASTRIC FUNDOPLICATION  2003, 2004    laparoscopic   ? ESOPHAGOGASTRIC FUNDOPLICATION     ? HX HYSTERECTOMY     ? PR ESOPHAGOSCOPY FLEXIBLE TRANSORAL DIAGNOSTIC        Social History     Tobacco Use   ? Smoking status: Never Smoker   ? Smokeless tobacco: Never Used Substance Use Topics   ? Alcohol use: No      Family History   Problem Relation Age of Onset   ? Other Mother    ? Kidney Failure Mother    ? Osteoporosis Mother    ? Cancer Father         esophageal   ? Liver Disease Sister         endstage, s/p liver transplant   ? Cancer Brother         tonsil, liver    ?  Hip Fracture Neg Hx       Medications Prior to Admission   Medication Sig Dispense Refill Last Dose   ? ciprofloxacin (CIPRO) 500 mg tablet Take one tablet by mouth daily. 30 tablet 1    ? duloxetine DR (CYMBALTA) 60 mg capsule Take 60 mg by mouth daily.      ? furosemide (LASIX) 40 mg tablet Take one-half tablet by mouth every morning. 90 tablet 0    ? lidocaine (ASPERCREME PATCH) 4 % topical patch Apply one each topically to affected area daily. 5 each 1    ? midodrine (PROAMITINE) 10 mg tablet TAKE 1 TABLET BY MOUTH THREE TIMES A DAY 90 tablet 1    ? oxyCODONE (ROXICODONE) 5 mg tablet Take one tablet by mouth every 6 hours as needed 20 tablet 0    ? pantoprazole DR (PROTONIX) 40 mg tablet TAKE 1 TABLET BY MOUTH TWICE A DAY 180 tablet 1    ? polyethylene glycol 3350 (MIRALAX) 17 g packet Take one packet by mouth twice daily.      ? promethazine (PHENERGAN) 25 mg tablet Take 25 mg by mouth every 4 hours as needed. Just uses it with Tramadol      ? rifAXIMin (XIFAXAN) 550 mg tablet Take one tablet by mouth twice daily. 180 tablet 3    ? zinc sulfate 220 mg (50 mg elemental zinc) capsule Take one capsule by mouth daily. 90 capsule 3      Allergies   Allergen Reactions   ? Tegaderm BLISTERS   ? Adhesive Tape (Rosins) RASH     Other reaction(s): irritated skin   ? Sulfa (Sulfonamide Antibiotics) RASH     Other reaction(s): Unknown Reaction   ? Codeine NAUSEA AND VOMITING   ? Morphine SEE COMMENTS     Pt reports burns her veins  Other reaction(s): Unknown Reaction   ? Penicillin G SEE COMMENTS     Throat issues, blisters in throat    ? Tramadol NAUSEA ONLY and ITCHING     Takes this medication regularly Review of Systems  A comprehensive review of systems was negative except for: Gastrointestinal: positive for abdominal pain    Physical Exam:  Vital Signs: Last Filed In 24 Hours Vital Signs: 24 Hour Range   BP: 144/35 (08/11 1334)  Pulse: 56 (08/11 1334)  SpO2: 100 % (08/11 1334) BP: (144)/(35)   Pulse:  [56]   SpO2:  [100 %]           General appearance: alert and no distress noted.  Neurologic: Grossly normal.  Lungs: Non labored.  Heart: regular rate and rhythm  Abdomen: distended    Pre-procedure anxiolysis plan: N/A  Sedation/Medication Plan: Local anesthetic  Personal history of sedation complications: Denies adverse event.   Family history of sedation complications: Denies adverse event.   Medications for Reversal: NA  Discussion/Reviews:  Physician has discussed risks and alternatives of this type of sedation and above planned procedures with patient    NPO Status: NA  Airway:  NA  Head and Neck: NA  Mouth: NA   Anesthesia Classification:  ASA III (A patient with a severe systemic disease that limits activity, but is not incapacitating)  Pregnancy Status: Not Pregnant    Lab/Radiology/Other Diagnostic Tests:  Labs:  Pertinent labs reviewed           Dearra Myhand P Divine-Thiele, APRN-NP  Pager [phone]

## 2020-06-30 NOTE — Patient Instructions
IR-PARACENTESIS   A paracentesis is the removal of an abnormal buildup of fluid in your abdominal cavity. This fluid buildup is called ascites and may be caused by conditions such as liver disease, heart failure, or cancer. During this procedure, a needle is inserted into your abdomen to drain the fluid. The fluid may then be sent to the lab for testing if medically indicated. Removal of the fluid may also relieve belly pressure and shortness of breath caused by the ascites.?  POST-PROCEDURE PAIN:   ? Pain control following your procedure is a priority for both you and your Physicians.  ? Some soreness or tenderness at the site is to be expected for several days. We recommend taking over the counter analgesics to help relieve this pain.  ? Alternative methods for pain relief include but not limited to heat or cold compress, relaxation techniques, rest, and changing of positions.  ? If pain continues after 5-7 days or you have severe pain not relieved by medication, please contact us as directed below.?  POST-PROCEDURE ACTIVITY:   ? A responsible adult must drive you home.  ? If you receive sedation, narcotic pain medication or anesthesia for the procedure, you should not drive or operate heavy machinery or do anything that requires concentration for at least 24 hours after procedure completion.  ? It is recommended that a responsible adult be with you until morning.?  POST-PROCEDURE SITE CARE:   ? You will have a small bandage over the procedure site. Keep this dry.  ? You may remove it in 24 hours.  ? You may shower in 24 hours, after removing the bandage.  ? A dry gauze bandage may be reapplied as necessary to protect your clothing as the site may sometimes leak for several days after the procedure.  ? Do not submerge the procedure site for 1 week (no bathtub, swimming, hot tub, etc.)  ? Do not use ointments, creams or powders on the puncture site.  ? Be sure your hands are clean when touching near the site.?  DIET/MEDICATIONS:   ? You may resume your previous diet after the procedure.  ? If you receive sedation or narcotic pain medications, avoid any foods or beverages containing alcohol for at least 24 hours after the procedure.  ? Please see the Medication Reconciliation sheet for instructions regarding resuming your home medications.?  CALL THE DOCTOR IF:   ? Bright red blood soaks the bandage.  ? You have pain not relieved by medication. Some soreness at the site is to be expected.  ? You have signs of infection such as: fever greater than 101F, chills, redness, warmth, swelling, drainage or pus from the puncture site.  For any of the above symptoms or for problems or concerns related to the procedure performed at the [facility], call [phone] Monday-Friday from 7-5p. After-hours and weekends, please call [phone] and ask for the Interventional Radiology Resident on-call.   You or your caregiver should call 911 for any severe symptoms such as excessive bleeding, severe dizziness, trouble breathing or loss of consciousness.   ?  ?

## 2020-06-30 NOTE — Progress Notes
[name] returned call and pt to come to infusion clinic at 0800 07-01-20. Pt agreeable with this plan of care.

## 2020-07-01 ENCOUNTER — Encounter: Admit: 2020-07-01 | Discharge: 2020-07-01 | Payer: MEDICARE

## 2020-07-01 ENCOUNTER — Ambulatory Visit: Admit: 2020-07-01 | Discharge: 2020-07-02 | Payer: MEDICARE

## 2020-07-01 DIAGNOSIS — K31819 Angiodysplasia of stomach and duodenum without bleeding: Principal | ICD-10-CM

## 2020-07-01 NOTE — Patient Instructions
HEART FAILURE INFUSION CLINIC  OUTPATIENT POST TRANSFUSION INSTRUCTIONS     During your transfusion you were monitored by nursing staff for signs of a transfusion reaction, however, you should continue to observe for the following symptoms after you have been dismissed from the clinic:     FEVER OF 100.5 OR HIGHER  CHILLS  NAUSEA OR VOMITING  FEELING FAINT OR DIZZY  SHORTNESS OF BREATH  DARK OR RED COLORED URINE  CHEST OR BACK PAIN  SHOCK OR LOSS OF CONSCIOUSNESS  YELLOWING OF THE EYES OR SKIN     If you or your caregiver notice any of these symptoms, contact your ordering physician immediately and go to the nearest Emergency Department for treatment. When you arrive at the Emergency Department notify them that you recently received a blood transfusion. *For more detailed signs and symptoms of a transfusion reaction, please refer to the [place] education information below.          When You Need a Blood Transfusion (Adult)  A blood transfusion may be done when you have lost blood because of an injury or during surgery. It can also be done because of diseases or conditions that affect the blood. Blood is made up of several different parts (blood products). You may receive some or all of these blood products during a transfusion. Blood for transfusion is usually donated from another person (donor). Strict measures are taken to make sure that donated blood is safe before it's given to you. This sheet helps you understand how a blood transfusion is done. Your healthcare provider will discuss your condition with you and answer your questions.   The parts of blood  Blood can be broken down into different parts that perform special roles in the body. These parts include:  ? Red blood cells, which carry oxygen throughout the body.  ? Platelets, which help stop bleeding.  ? Plasma (the liquid part of blood), which carries red blood cells and platelets throughout the body. Plasma also helps platelets in stopping bleeding.  Where does donated blood come from?  ? Volunteer donors. These are people who donate their blood to help others in need of blood. Blood donation can take place at several places, including a hospital, blood bank, or during a blood drive.  ? Directed donation. If you need a blood transfusion during a planned surgery, family and friends can have their blood tested for compatibility and donate blood for you before the surgery. This needs to be done at least 7 day(s) in advance. This is because the blood must be tested for safety.  ? Autologous donation. This is also called self-donation. For planned surgery, you can donate your own blood starting up to 6 weeks before surgery.  Are blood transfusions safe?  Donated blood is tested and processed to make sure that the blood is safe:  ? The health and medical history of each donor is carefully screened. If a person is considered high-risk for infection or problems, he or she isn't accepted as a blood donor.  ? Donated blood is tested for infections such as hepatitis, syphilis, and HIV (the virus that causes AIDS). If the tested blood is found to be unsafe, it's destroyed.  ? Blood is divided into four types: A, B, AB, and O. Blood also has Rh types: positive (+) and negative (-). You can only receive blood products that are compatible with (match) your blood type. A sample of your blood is tested for compatibility with donated blood. This  is done before blood products are prepared for a transfusion.  How is a blood transfusion done?  A blood transfusion takes place in a blood center, infusion center, hospital room, or operating room. Your healthcare provider will discuss the blood transfusion with you before it's done. You'll need to give permission for the blood transfusion by signing a consent form.  ? Two healthcare providers confirm your identity. They also confirm that they have the correct blood product(s) for you.  ? An intravenous (IV) line is placed in a vein if you do not already have an IV.  ? The blood product comes in a plastic bag that is hung on an IV pole. The blood product flows from the bag into your IV line. The IV line may be connected to a pump, which controls the transfusion rate. You may receive more than one kind of blood product through the IV.  ? Your vital signs (blood pressure, heart rate, respiratory rate, and temperature) are checked throughout the transfusion. This is to make sure you are not having a reaction to the blood product.  ? The IV line may be removed once the transfusion is complete.  Possible risks and complications of blood transfusions  Most transfusions are problem free. In some cases, reactions occur. These can happen within seconds to minutes during the transfusion or a week to a few months after the transfusion. Call your doctor or nurse right away if you have any of the signs or symptoms in the table below during or after a transfusion:  Reaction Timing Symptoms   Allergic reaction (mild) ? Within seconds to minutes during the transfusion  ? Up to 24 hours after the transfusion Hives or red welts on the skin, mild itching, rash, localized swelling, flushing (red face), wheezing, shortness of breath, or stridor (high-pitched noise or sound)   Anaphylactic reaction ? Within seconds to minutes during the transfusion  ? Up to 24 hours after the transfusion Shortness of breath, flushing (red face), wheezing, labored (working hard) breathing, low blood pressure, localized swelling, chest tightness, or cramps   Febrile nonhemolytic reaction ? Within minutes to hours during the transfusion  ? Within a few hours to 24 hours after the transfusion Fever (increase of 1? C or higher), chills, flushing (red face), nausea, headache, minor discomfort, or mild shortness of breath   Acute immune hemolytic reaction ? Within minutes during the transfusion  ? Up to 24 hours after the transfusion Fever, red or brown urine, back pain, fast heart rate (tachycardia), abdominal pain, low blood pressure, feeling anxious, chills, chest pain, nausea, or fainting spells   Transfusion-related acute lung injury (TRALI) ? Within 1 to 2 hours during the transfusion  ? Up to 6 hours after the transfusion Shortness of breath, trouble breathing, low blood pressure, fever, pulmonary edema   Transfusion-associated circulatory overload ? Near the end of the transfusion  ? Within 6 hours after the transfusion Shortness of breath, fast heart rate (tachycardia), problems breathing when lying on back, abnormal blood pressure   Post-transfusion purpura (PUP) ? Within 1 week  ? Up to 48 days after the transfusion Purple spots on skin; nose bleed; bleeding from the urinary tract, abdomen, colon, or rectum; fever; or chills     Delayed transfusion-related acute lung injury (TRALI) ? Within 72 hours (3 days) after the transfusion Sudden onset of respiratory distress or trouble breathing   Delayed hemolytic reaction ? Within 3 to 7 days  ? Up to weeks after  the transfusion Low-grade fever, mild jaundice (yellowing of the skin and whites of the eyes), decrease in hematocrit, chills, chest pain, back pain, nausea   ? 2000-2019 [employer] [place]. [address] [place], [place] [address]. All rights reserved. This information is not intended as a substitute for professional medical care. Always follow your healthcare professional's instructions.

## 2020-07-02 ENCOUNTER — Encounter: Admit: 2020-07-02 | Discharge: 2020-07-03 | Payer: MEDICARE

## 2020-07-03 DIAGNOSIS — Z20822 Contact with and (suspected) exposure to covid-19: Secondary | ICD-10-CM

## 2020-07-04 ENCOUNTER — Encounter: Admit: 2020-07-04 | Discharge: 2020-07-04 | Payer: MEDICARE

## 2020-07-04 NOTE — Telephone Encounter
pt called in asking for you to CB today regarding blood work.

## 2020-07-04 NOTE — Telephone Encounter
call returned to patient, advised since 2 units blood given Friday 8/13, patient OK to have blood draw repeated tomorrow. She was appreciative of call.

## 2020-07-04 NOTE — Telephone Encounter
Call from [facility] regarding lab draw scheduled for today.  Asking about necessity.  Please return her call.

## 2020-07-04 NOTE — Telephone Encounter
Incoming message from Brigitte Pulse that Dr. Ladona Ridgel is requesting pt have EGD with Dr. Keenan Bachelor or Dr. Milta Deiters sometime this week.  Pt is currently scheduled for procedure tomorrow with Dr. Maryruth Bun. Spoke with Dennie Fetters in scheduling and she will reach out to pt to see about putting her on with Dr. Keenan Bachelor or Dr. Milta Deiters on Friday (8/20)

## 2020-07-07 ENCOUNTER — Encounter: Admit: 2020-07-07 | Discharge: 2020-07-07 | Payer: MEDICARE

## 2020-07-07 ENCOUNTER — Ambulatory Visit: Admit: 2020-07-07 | Discharge: 2020-07-07 | Payer: MEDICARE

## 2020-07-07 DIAGNOSIS — K5730 Diverticulosis of large intestine without perforation or abscess without bleeding: Secondary | ICD-10-CM

## 2020-07-07 DIAGNOSIS — J329 Chronic sinusitis, unspecified: Secondary | ICD-10-CM

## 2020-07-07 DIAGNOSIS — E669 Obesity, unspecified: Secondary | ICD-10-CM

## 2020-07-07 DIAGNOSIS — D500 Iron deficiency anemia secondary to blood loss (chronic): Secondary | ICD-10-CM

## 2020-07-07 DIAGNOSIS — I10 Essential (primary) hypertension: Secondary | ICD-10-CM

## 2020-07-07 DIAGNOSIS — K7460 Unspecified cirrhosis of liver: Secondary | ICD-10-CM

## 2020-07-07 DIAGNOSIS — I729 Aneurysm of unspecified site: Secondary | ICD-10-CM

## 2020-07-07 DIAGNOSIS — E119 Type 2 diabetes mellitus without complications: Secondary | ICD-10-CM

## 2020-07-07 DIAGNOSIS — K760 Fatty (change of) liver, not elsewhere classified: Secondary | ICD-10-CM

## 2020-07-07 DIAGNOSIS — Z01810 Encounter for preprocedural cardiovascular examination: Secondary | ICD-10-CM

## 2020-07-07 DIAGNOSIS — R945 Abnormal results of liver function studies: Secondary | ICD-10-CM

## 2020-07-07 DIAGNOSIS — D649 Anemia, unspecified: Secondary | ICD-10-CM

## 2020-07-07 DIAGNOSIS — K31819 Angiodysplasia of stomach and duodenum without bleeding: Secondary | ICD-10-CM

## 2020-07-07 DIAGNOSIS — K7469 Other cirrhosis of liver: Secondary | ICD-10-CM

## 2020-07-07 DIAGNOSIS — R0600 Dyspnea, unspecified: Secondary | ICD-10-CM

## 2020-07-07 LAB — CELL COUNT W/DIFF-FLUIDS
Lab: 1 %
Lab: 1 %
Lab: 40 /uL
Lab: 89 %
Lab: 9 %
Lab: <10 /uL

## 2020-07-07 LAB — CBC AND DIFF
Lab: 0 10*3/uL (ref 0–0.45)
Lab: 0.1 10*3/uL (ref 0–0.20)
Lab: 0.5 10*3/uL — ABNORMAL LOW (ref 1.0–4.8)
Lab: 0.8 10*3/uL — ABNORMAL HIGH (ref 0–0.80)
Lab: 1 % (ref 0–2)
Lab: 1 % (ref 0–5)
Lab: 10 % (ref 4–12)
Lab: 2.5 M/UL — ABNORMAL LOW (ref 4.0–5.0)
Lab: 21 % — ABNORMAL HIGH (ref 11–15)
Lab: 21 % — ABNORMAL LOW (ref 36–45)
Lab: 253 10*3/uL (ref 150–400)
Lab: 26 pg (ref 26–34)
Lab: 32 g/dL (ref 32.0–36.0)
Lab: 6.9 g/dL — ABNORMAL LOW (ref 12.0–15.0)
Lab: 7 % — ABNORMAL LOW (ref 24–44)
Lab: 7 10*3/uL — ABNORMAL HIGH (ref 1.8–7.0)
Lab: 8.6 10*3/uL (ref 4.5–11.0)
Lab: 8.7 FL (ref 7–11)
Lab: 81 % — ABNORMAL HIGH (ref 41–77)

## 2020-07-07 LAB — COMPREHENSIVE METABOLIC PANEL
Lab: 1.4 mg/dL — ABNORMAL HIGH (ref 0.4–1.00)
Lab: 1.8 mg/dL — ABNORMAL HIGH (ref 0.3–1.2)
Lab: 103 MMOL/L (ref 98–110)
Lab: 129 U/L — ABNORMAL HIGH (ref 25–110)
Lab: 131 MMOL/L — ABNORMAL LOW (ref 137–147)
Lab: 175 mg/dL — ABNORMAL HIGH (ref 70–100)
Lab: 19 MMOL/L — ABNORMAL LOW (ref 21–30)
Lab: 2.8 g/dL — ABNORMAL LOW (ref 3.5–5.0)
Lab: 20 U/L (ref 7–56)
Lab: 37 mL/min — ABNORMAL LOW (ref >60)
Lab: 45 mL/min — ABNORMAL LOW (ref >60)
Lab: 48 mg/dL — ABNORMAL HIGH (ref 7–25)
Lab: 49 U/L — ABNORMAL HIGH (ref 7–40)
Lab: 5.1 MMOL/L (ref 3.5–5.1)
Lab: 5.6 g/dL — ABNORMAL LOW (ref 6.0–8.0)
Lab: 8.6 mg/dL (ref 8.5–10.6)
Lab: 9 (ref 3–12)

## 2020-07-07 LAB — GRAM STAIN

## 2020-07-07 LAB — PROTIME INR (PT): Lab: 1.5 FL — ABNORMAL HIGH (ref 0.8–1.2)

## 2020-07-07 MED ORDER — ALBUMIN, HUMAN 25 % IV SOLP
0 refills | Status: CP
Start: 2020-07-07 — End: ?
  Administered 2020-07-07 (×3): 12.5 g via INTRAVENOUS

## 2020-07-07 NOTE — Telephone Encounter
IR notified this RN that patient hgb today is 6.9. 1 unit PRBCs ordered, patient will get in IR today.

## 2020-07-07 NOTE — Patient Instructions
IR-PARACENTESIS   A paracentesis is the removal of an abnormal buildup of fluid in your abdominal cavity. This fluid buildup is called ascites and may be caused by conditions such as liver disease, heart failure, or cancer. During this procedure, a needle is inserted into your abdomen to drain the fluid. The fluid may then be sent to the lab for testing if medically indicated. Removal of the fluid may also relieve belly pressure and shortness of breath caused by the ascites.?  POST-PROCEDURE PAIN:   ? Pain control following your procedure is a priority for both you and your Physicians.  ? Some soreness or tenderness at the site is to be expected for several days. We recommend taking over the counter analgesics to help relieve this pain.  ? Alternative methods for pain relief include but not limited to heat or cold compress, relaxation techniques, rest, and changing of positions.  ? If pain continues after 5-7 days or you have severe pain not relieved by medication, please contact us as directed below.?  POST-PROCEDURE ACTIVITY:   ? A responsible adult must drive you home.  ? If you receive sedation, narcotic pain medication or anesthesia for the procedure, you should not drive or operate heavy machinery or do anything that requires concentration for at least 24 hours after procedure completion.  ? It is recommended that a responsible adult be with you until morning.?  POST-PROCEDURE SITE CARE:   ? You will have a small bandage over the procedure site. Keep this dry.  ? You may remove it in 24 hours.  ? You may shower in 24 hours, after removing the bandage.  ? A dry gauze bandage may be reapplied as necessary to protect your clothing as the site may sometimes leak for several days after the procedure.  ? Do not submerge the procedure site for 1 week (no bathtub, swimming, hot tub, etc.)  ? Do not use ointments, creams or powders on the puncture site.  ? Be sure your hands are clean when touching near the site.?  DIET/MEDICATIONS:   ? You may resume your previous diet after the procedure.  ? If you receive sedation or narcotic pain medications, avoid any foods or beverages containing alcohol for at least 24 hours after the procedure.  ? Please see the Medication Reconciliation sheet for instructions regarding resuming your home medications.?  CALL THE DOCTOR IF:   ? Bright red blood soaks the bandage.  ? You have pain not relieved by medication. Some soreness at the site is to be expected.  ? You have signs of infection such as: fever greater than 101F, chills, redness, warmth, swelling, drainage or pus from the puncture site.  For any of the above symptoms or for problems or concerns related to the procedure performed at the [facility], call [phone] Monday-Friday from 7-5p. After-hours and weekends, please call [phone] and ask for the Interventional Radiology Resident on-call.   You or your caregiver should call 911 for any severe symptoms such as excessive bleeding, severe dizziness, trouble breathing or loss of consciousness.   ?  ?

## 2020-07-07 NOTE — Telephone Encounter
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

## 2020-07-07 NOTE — Other
Immediate Post Procedure Note    Date:  07/07/2020                                         Attending Physician:   Dr. Val Eagle  Performing Provider:  Earlean Shawl, MD    Consent:  Consent obtained from patient.  Time out performed: Consent obtained, correct patient verified, correct procedure verified, correct site verified, patient marked as necessary.  Pre/Post Procedure Diagnosis:  Paracentesis  Indications:  Ascites      Procedure(s): Paracentesis  Findings: Clear, yellow fluid     Estimated Blood Loss:  None/Negligible  Specimen(s) Removed/Disposition:  Yes, sent to pathology  Complications: None  Patient Tolerated Procedure: Well  Post-Procedure Condition:  stable    Earlean Shawl, MD

## 2020-07-07 NOTE — H&P (View-Only)
IR Pre-Procedure History and Physical/Sedation Plan    Procedure Date: 07/07/2020     Planned Procedure(s):  Ultrasound-guided paracentesis     Indication:  Fluid testing; Therapeutic drainage  __________________________________________________________________    Chief Complaint:  Ascites    History of Present Illness: Kristin Moody is a 64 y.o. female with a history as listed below who presents today for procedure.    Patient Active Problem List    Diagnosis Date Noted   ? Pleurodynia 06/04/2020   ? Pneumothorax 06/04/2020   ? 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 Hypotension 02/10/2020   ? 'Light-for-dates' infant with signs of fetal malnutrition 01/22/2020   ? 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 Anemia 09/28/2019   ? Encephalopathy 08/12/2019   ? Spasticity 08/10/2019   ? Hyperreflexia 08/10/2019   ? 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 Right leg weakness 05/14/2019   ? 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 Iron (Fe) deficiency anemia 01/19/2019   ? Preop cardiovascular exam 01/07/2019   ? Pre-transplant evaluation for liver transplant 01/06/2019   ? Cirrhosis (HCC) 12/16/2018   ? Acute on chronic anemia 12/16/2018   ? Lactic acidosis 12/16/2018   ? GI bleed 10/24/2018   ? Portal hypertension (HCC) 08/28/2018   ? Confusion 08/27/2018   ? Dyspnea 08/27/2018   ? Chronic abdominal pain 08/27/2018   ? 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 Acute on chronic blood loss anemia 08/27/2018   ? Iron deficiency anemia 04/13/2018   ? Pneumonia due to infectious organism 04/13/2018   ? Melena 04/10/2018   ? Esophageal varices without bleeding (HCC) 02/25/2018   ? 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 Lower abdominal pain 10/24/2014   ? Chest discomfort 09/21/2014   ? Essential hypertension 08/13/2012   ? Abnormal liver function tests 05/13/2011   ? Fatty liver disease, nonalcoholic 05/13/2011   ? Obesity (BMI 30-39.9) 05/13/2011   ? Slow transit constipation 05/13/2011     Medical History:   Diagnosis Date   ? Aneurysm (HCC)    ? 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 Diverticulosis of colon     descending and sigmoid colon   ? Dyspnea    ? Essential hypertension 08/13/2012   ? Fatty infiltration of liver    ? HTN (hypertension)    ? Obesity (BMI 30-39.9) 05/13/2011   ? Preop cardiovascular exam 01/07/2019   ? Sinus infection    ? Type 2 diabetes mellitus (HCC) 09/21/2014      Surgical History:   Procedure Laterality Date   ? HYSTERECTOMY  1994   ? UPPER GASTROINTESTINAL ENDOSCOPY  2009   ? COLONOSCOPY  2009   ? CYSTOCELE REPAIR  2009    with endocele repair   ? RECTOCELE REPAIR  03/2009   ? LIVER BIOPSY  07/17/2010   ? COLONOSCOPY N/A 11/08/2017    Performed by Dawna Part, MD at [facility] ENDO   ? ESOPHAGOGASTRODUODENOSCOPY N/A 11/08/2017    Performed by Dawna Part, MD at [facility] ENDO   ? ESOPHAGOGASTRODUODENOSCOPY WITH BIOPSY - FLEXIBLE  11/08/2017    Performed by Dawna Part, MD at [facility] ENDO   ? COLONOSCOPY WITH HOT BIOPSY FORCEPS REMOVAL TUMOR/ POLYP/ OTHER LESION  11/08/2017    Performed by Dawna Part, MD at [facility] ENDO   ? ESOPHAGOGASTRODUODENOSCOPY WITH BAND LIGATION ESOPHAGEAL/ GASTRIC  VARICES - FLEXIBLE N/A 04/10/2018    Performed by Normajean Baxter, MD at [facility] ENDO   ? ESOPHAGOGASTRODUODENOSCOPY WITH BIOPSY - FLEXIBLE N/A 04/10/2018    Performed by Normajean Baxter, MD at [facility] ENDO   ? ESOPHAGOGASTRODUODENOSCOPY WITH CONTROL OF BLEEDING - FLEXIBLE N/A 04/25/2018    Performed by Jolee Ewing, MD at [facility] ENDO   ? ESOPHAGOGASTRODUODENOSCOPY WITH BIOPSY - FLEXIBLE with push enteroscopy N/A 08/28/2018    Performed by Celesta Gentile, MD at [facility] ENDO   ? EGD N/A 10/27/2018    Performed by Eliott Nine, MD at [facility] ENDO   ? ESOPHAGOGASTRODUODENOSCOPY WITH SNARE REMOVAL TUMOR/ POLYP/ OTHER LESION - FLEXIBLE N/A 10/27/2018    Performed by Eliott Nine, MD at [facility] ENDO   ? ESOPHAGOGASTRODUODENOSCOPY WITH BIOPSY - FLEXIBLE N/A 12/16/2018    Performed by Buckles, Vinnie Level, MD at [facility] ENDO   ? ESOPHAGOGASTRODUODENOSCOPY WITH CONTROL OF BLEEDING - FLEXIBLE N/A 12/16/2018    Performed by Buckles, Vinnie Level, MD at [facility] ENDO   ? ESOPHAGOGASTRODUODENOSCOPY WITH SPECIMEN COLLECTION BY BRUSHING/ WASHING N/A 01/22/2019    Performed by Veneta Penton, MD at [facility] ENDO   ? ESOPHAGOGASTRODUODENOSCOPY WITH DILATION ESOPHAGUS WITH BALLOON 30 MM OR GREATER - FLEXIBLE N/A 01/22/2019    Performed by Veneta Penton, MD at [facility] ENDO   ? ESOPHAGOGASTRODUODENOSCOPY WITH BIOPSY - FLEXIBLE N/A 01/22/2019    Performed by Veneta Penton, MD at [facility] ENDO   ? ESOPHAGOGASTRODUODENOSCOPY WITH BIOPSY - FLEXIBLE N/A 06/19/2019    Performed by Dawna Part, MD at [facility] ENDO   ? ESOPHAGOGASTRODUODENOSCOPY [WITH APC]  WITH SPECIMEN COLLECTION BY BRUSHING/ WASHING N/A 08/07/2019    Performed by Dawna Part, MD at [facility] ENDO   ? ESOPHAGOGASTRODUODENOSCOPY WITH SPECIMEN COLLECTION BY BRUSHING/ WASHING N/A 09/10/2019    Performed by Buckles, Vinnie Level, MD at [facility] ENDO   ? ESOPHAGOGASTRODUODENOSCOPY WITH CONTROL OF BLEEDING - FLEXIBLE N/A 09/10/2019    Performed by Buckles, Vinnie Level, MD at [facility] ENDO   ? ESOPHAGOGASTRODUODENOSCOPY WITH CONTROL OF BLEEDING - FLEXIBLE N/A 02/12/2020    Performed by Lenor Derrick, MD at [facility] ENDO   ? ESOPHAGOGASTRODUODENOSCOPY WITH BIOPSY - FLEXIBLE N/A 02/26/2020    Performed by Buckles, Vinnie Level, MD at [facility] ENDO   ? ESOPHAGOGASTRODUODENOSCOPY WITH BIOPSY - FLEXIBLE N/A 05/18/2020    Performed by Lenor Derrick, MD at [facility] ENDO   ? ESOPHAGOGASTRODUODENOSCOPY WITH CONTROL OF BLEEDING - FLEXIBLE N/A 05/18/2020    Performed by Lenor Derrick, MD at [facility] ENDO   ? CHOLECYSTECTOMY     ? COLONOSCOPY     ? ESOPHAGOGASTRIC FUNDOPLICATION  2003, 2004    laparoscopic   ? ESOPHAGOGASTRIC FUNDOPLICATION     ? HX HYSTERECTOMY     ? PR ESOPHAGOSCOPY FLEXIBLE TRANSORAL DIAGNOSTIC        Social History     Tobacco Use   ? Smoking status: Never Smoker   ? Smokeless tobacco: Never Used Substance Use Topics   ? Alcohol use: No      Family History   Problem Relation Age of Onset   ? Other Mother    ? Kidney Failure Mother    ? Osteoporosis Mother    ? Cancer Father         esophageal   ? Liver Disease Sister         endstage, s/p liver transplant   ? Cancer Brother         tonsil, liver    ?  Hip Fracture Neg Hx       Medications Prior to Admission   Medication Sig Dispense Refill Last Dose   ? ciprofloxacin (CIPRO) 500 mg tablet Take one tablet by mouth daily. 30 tablet 1    ? duloxetine DR (CYMBALTA) 60 mg capsule Take 60 mg by mouth daily.      ? furosemide (LASIX) 40 mg tablet Take one-half tablet by mouth every morning. 90 tablet 0    ? lidocaine (ASPERCREME PATCH) 4 % topical patch Apply one each topically to affected area daily. 5 each 1    ? midodrine (PROAMITINE) 10 mg tablet TAKE 1 TABLET BY MOUTH THREE TIMES A DAY 90 tablet 1    ? oxyCODONE (ROXICODONE) 5 mg tablet Take one tablet by mouth every 6 hours as needed 20 tablet 0    ? pantoprazole DR (PROTONIX) 40 mg tablet TAKE 1 TABLET BY MOUTH TWICE A DAY 180 tablet 1    ? polyethylene glycol 3350 (MIRALAX) 17 g packet Take one packet by mouth twice daily.      ? promethazine (PHENERGAN) 25 mg tablet Take 25 mg by mouth every 4 hours as needed. Just uses it with Tramadol      ? rifAXIMin (XIFAXAN) 550 mg tablet Take one tablet by mouth twice daily. 180 tablet 3    ? zinc sulfate 220 mg (50 mg elemental zinc) capsule Take one capsule by mouth daily. 90 capsule 3      Allergies   Allergen Reactions   ? Tegaderm BLISTERS   ? Adhesive Tape (Rosins) RASH     Other reaction(s): irritated skin   ? Sulfa (Sulfonamide Antibiotics) RASH     Other reaction(s): Unknown Reaction   ? Codeine NAUSEA AND VOMITING   ? Morphine SEE COMMENTS     Pt reports burns her veins  Other reaction(s): Unknown Reaction   ? Penicillin G SEE COMMENTS     Throat issues, blisters in throat    ? Tramadol NAUSEA ONLY and ITCHING     Takes this medication regularly Review of Systems  A comprehensive review of systems was negative.    Physical Exam:  Vital Signs: Last Filed In 24 Hours Vital Signs: 24 Hour Range                General appearance: alert and no distress noted.  Neurologic: Grossly normal.  Lungs: Non labored.  Heart: regular rate and rhythm  Abdomen: distended    Pre-procedure anxiolysis plan: N/A  Sedation/Medication Plan: Local anesthetic  Personal history of sedation complications: Denies adverse event.   Family history of sedation complications: Denies adverse event.   Medications for Reversal: NA  Discussion/Reviews:  Physician has discussed risks and alternatives of this type of sedation and above planned procedures with patient    NPO Status: NA  Airway:  NA  Head and Neck: NA  Mouth: NA   Anesthesia Classification:  ASA III (A patient with a severe systemic disease that limits activity, but is not incapacitating)  Pregnancy Status: Not Pregnant    Lab/Radiology/Other Diagnostic Tests:  Labs:  Pertinent labs reviewed           Wesley Blas, APRN-NP  Pager [phone]

## 2020-07-08 ENCOUNTER — Ambulatory Visit: Admit: 2020-07-08 | Discharge: 2020-07-08 | Payer: MEDICARE

## 2020-07-08 ENCOUNTER — Encounter: Admit: 2020-07-08 | Discharge: 2020-07-08 | Payer: MEDICARE

## 2020-07-08 DIAGNOSIS — I10 Essential (primary) hypertension: Secondary | ICD-10-CM

## 2020-07-08 DIAGNOSIS — J329 Chronic sinusitis, unspecified: Secondary | ICD-10-CM

## 2020-07-08 DIAGNOSIS — K5730 Diverticulosis of large intestine without perforation or abscess without bleeding: Secondary | ICD-10-CM

## 2020-07-08 DIAGNOSIS — Z01810 Encounter for preprocedural cardiovascular examination: Secondary | ICD-10-CM

## 2020-07-08 DIAGNOSIS — R0600 Dyspnea, unspecified: Secondary | ICD-10-CM

## 2020-07-08 DIAGNOSIS — I729 Aneurysm of unspecified site: Secondary | ICD-10-CM

## 2020-07-08 DIAGNOSIS — K7460 Unspecified cirrhosis of liver: Secondary | ICD-10-CM

## 2020-07-08 DIAGNOSIS — K760 Fatty (change of) liver, not elsewhere classified: Secondary | ICD-10-CM

## 2020-07-08 DIAGNOSIS — E669 Obesity, unspecified: Secondary | ICD-10-CM

## 2020-07-08 DIAGNOSIS — E119 Type 2 diabetes mellitus without complications: Secondary | ICD-10-CM

## 2020-07-08 MED ORDER — LACTATED RINGERS IV SOLP
INTRAVENOUS | 0 refills | Status: DC
Start: 2020-07-08 — End: 2020-07-08
  Administered 2020-07-08: 15:00:00 via INTRAVENOUS

## 2020-07-08 MED ORDER — LIDOCAINE (PF) 20 MG/ML (2 %) IJ SOLN
INTRAVENOUS | 0 refills | Status: DC
Start: 2020-07-08 — End: 2020-07-08
  Administered 2020-07-08: 15:00:00 60 mg via INTRAVENOUS

## 2020-07-08 MED ORDER — PROPOFOL 10 MG/ML IV EMUL 20 ML (INFUSION)(AM)(OR)
INTRAVENOUS | 0 refills | Status: DC
Start: 2020-07-08 — End: 2020-07-08
  Administered 2020-07-08: 15:00:00 125 ug/kg/min via INTRAVENOUS

## 2020-07-08 NOTE — Anesthesia Post-Procedure Evaluation
Post-Anesthesia Evaluation    Name: Kristin Moody      MRN: [id number]     DOB: 03/20/1956     Age: 64 y.o.     Sex: female   __________________________________________________________________________     Procedure Information     Anesthesia Start Date/Time: 07/08/20 0945    Procedure: ESOPHAGOGASTRODUODENOSCOPY WITH CONTROL OF BLEEDING - FLEXIBLE (N/A )    Location: ENDO 1 / ENDO/GI    Surgeons: Vertell Novak, MD          Post-Anesthesia Vitals  BP: 104/57 (08/20 1055)  Temp: 36.4 ?C (97.5 ?F) (08/20 1012)  Pulse: 93 (08/20 1055)  Respirations: 19 PER MINUTE (08/20 1055)  SpO2: 96 % (08/20 1055)  SpO2 Pulse: 93 (08/20 1055)   Vitals Value Taken Time   BP 104/57 07/08/20 1055   Temp     Pulse 93 07/08/20 1055   Respirations 19 PER MINUTE 07/08/20 1055   SpO2 96 % 07/08/20 1055         Post Anesthesia Evaluation Note    Evaluation location: Pre/Post  Patient participation: recovered; patient participated in evaluation  Level of consciousness: alert    Pain score: 0  Pain management: adequate    Hydration: normovolemia  Temperature: 36.0?C - 38.4?C  Airway patency: adequate    Perioperative Events       Post-op nausea and vomiting: no PONV    Postoperative Status  Cardiovascular status: hemodynamically stable  Respiratory status: spontaneous ventilation        Perioperative Events  Perioperative Event: No  Emergency Case Activation: No

## 2020-07-08 NOTE — Anesthesia Pre-Procedure Evaluation
Anesthesia Pre-Procedure Evaluation    Name: Kristin Moody      MRN: [id number]     DOB: [birth year]     Age: 64 y.o.     Sex: female   _________________________________________________________________________     Procedure Date: 07/08/2020   Procedure: Procedure(s):  ESOPHAGOGASTRODUODENOSCOPY WITH CONTROL OF BLEEDING - FLEXIBLE     Physical Assessment  Vital Signs (last filed in past 24 hours):  BP: 107/69 (08/19 1500)  Temp: 36.8 ?C (98.3 ?F) (08/19 1120)  Pulse: 94 (08/19 1515)  Respirations: 21 PER MINUTE (08/19 1515)  SpO2: 100 % (08/19 1515)      Patient History  Allergies   Allergen Reactions   ? Tegaderm BLISTERS   ? Adhesive Tape (Rosins) RASH     Other reaction(s): irritated skin   ? Sulfa (Sulfonamide Antibiotics) RASH     Other reaction(s): Unknown Reaction   ? Codeine NAUSEA AND VOMITING   ? Morphine SEE COMMENTS     Pt reports burns her veins  Other reaction(s): Unknown Reaction   ? Penicillin G SEE COMMENTS     Throat issues, blisters in throat    ? Tramadol NAUSEA ONLY and ITCHING     Takes this medication regularly         Current Medications    Medication Directions   ciprofloxacin (CIPRO) 500 mg tablet Take one tablet by mouth daily.   duloxetine DR (CYMBALTA) 60 mg capsule Take 60 mg by mouth daily.   furosemide (LASIX) 40 mg tablet Take one-half tablet by mouth every morning.   lidocaine (ASPERCREME PATCH) 4 % topical patch Apply one each topically to affected area daily.   midodrine (PROAMITINE) 10 mg tablet TAKE 1 TABLET BY MOUTH THREE TIMES A DAY   oxyCODONE (ROXICODONE) 5 mg tablet Take one tablet by mouth every 6 hours as needed   pantoprazole DR (PROTONIX) 40 mg tablet TAKE 1 TABLET BY MOUTH TWICE A DAY   polyethylene glycol 3350 (MIRALAX) 17 g packet Take one packet by mouth twice daily.   promethazine (PHENERGAN) 25 mg tablet Take 25 mg by mouth every 4 hours as needed. Just uses it with Tramadol   rifAXIMin (XIFAXAN) 550 mg tablet Take one tablet by mouth twice daily.   zinc sulfate 220 mg (50 mg elemental zinc) capsule Take one capsule by mouth daily.         Review of Systems/Medical History      Patient summary reviewed  Nursing notes reviewed  Pertinent labs reviewed    PONV Screening: Female gender and Non-smoker  No history of anesthetic complications  No family history of anesthetic complications      Airway - negative        Pulmonary           No indications/hx of pneumonia      No recent URI      Shortness of breath      No sleep apnea      Cardiovascular       Recent diagnostic studies:          echocardiogram          TTE 01/07/19:  Rest Echo:   1. Normal left ventricular systolic function. EF~ 60%  2. Right ventricular size and systolic function are normal  3. No significant valvular stenosis or regurgitation  4. Estimated peak systolic PA pressure of 30 mmHg. Normal right atrial pressure  ?  Compared to previous study on 08/28/2018, tricuspid regurgitation is  less severe, PA systolic (30 VS 48) and RA pressures have improved.       Exercise tolerance: <4 METS      Beta Blocker therapy: No      Beta blockers within 24 hours: No        Hypertension, well controlled      Dyspnea on exertion      GI/Hepatic/Renal         GERD,       Liver disease      Cirrhosis      Ascites (last paracentesis 6/25)      Esophageal varices (patient reports history of banding)      Nausea      No vomiting      Per H&P from September had chronic oozing from very large antral nodules measuring 2 x 2.5 cm with superficial ulceration?from PHG. These been biopsied and not consistent with GAVE.         Neuro/Psych         Neuropathy      Chronic opioid use        Psychiatric history          Anxiety        Endocrine/Other       Diabetes, well controlled, type 2      Anemia (Hgb 6.9 - received 1 pRBC)      Blood dyscrasia      Obesity    Constitution - negative   Physical Exam    Airway Findings      Mallampati: II      TM distance: <3 FB      Neck ROM: full      Mouth opening: limited      Airway patency: adequate    Dental Findings:       Upper dentures and lower dentures    Cardiovascular Findings:       Rhythm: regular      Rate: normal      No murmur    Pulmonary Findings:       Breath sounds clear to auscultation.    Abdominal Findings:         Abdominal exam deferred    Neurological Findings:       Alert and oriented x 3    Constitutional findings:       No acute distress    Other Findings: Jaundiced       Diagnostic Tests  Hematology:   Lab Results   Component Value Date    HGB 6.9 07/07/2020    HCT 21.0 07/07/2020    PLTCT 253 07/07/2020    WBC 8.6 07/07/2020    NEUT 81 07/07/2020    ANC 7.01 07/07/2020    LYMPH 9.3 02/23/2020    ALC 0.56 07/07/2020    MONA 10 07/07/2020    AMC 0.84 07/07/2020    EOSA 1 07/07/2020    ABC 0.11 07/07/2020    BASOPHILS 1.2 02/23/2020    MCV 81.3 07/07/2020    MCH 26.5 07/07/2020    MCHC 32.6 07/07/2020    MPV 8.7 07/07/2020    RDW 21.4 07/07/2020         General Chemistry:   Lab Results   Component Value Date    NA 131 07/07/2020    K 5.1 07/07/2020    CL 103 07/07/2020    CO2 19 07/07/2020    GAP 9 07/07/2020    BUN 48 07/07/2020    CR 1.42 07/07/2020  GLU 175 07/07/2020    GLU 172 02/23/2020    CA 8.6 07/07/2020    ALBUMIN 2.8 07/07/2020    LACTIC 1.3 02/11/2020    MG 2.2 06/04/2020    TOTBILI 1.8 07/07/2020    PO4 3.2 10/28/2018      Coagulation:   Lab Results   Component Value Date    PT 15.8 02/23/2020    PTT 32.8 02/25/2020    INR 1.5 07/07/2020         Anesthesia Plan    ASA score: 4   Plan: MAC  Induction method: intravenous  NPO status: acceptable      Informed Consent  Anesthetic plan and risks discussed with patient.  Use of blood products discussed with patient  Blood Consent: consented      Plan discussed with: CRNA and anesthesiologist.  Comments: (Risks discussed questions answered and pt wishes to proceed )

## 2020-07-09 ENCOUNTER — Encounter: Admit: 2020-07-09 | Discharge: 2020-07-09 | Payer: MEDICARE

## 2020-07-09 DIAGNOSIS — K5730 Diverticulosis of large intestine without perforation or abscess without bleeding: Secondary | ICD-10-CM

## 2020-07-09 DIAGNOSIS — I10 Essential (primary) hypertension: Secondary | ICD-10-CM

## 2020-07-09 DIAGNOSIS — K760 Fatty (change of) liver, not elsewhere classified: Secondary | ICD-10-CM

## 2020-07-09 DIAGNOSIS — I729 Aneurysm of unspecified site: Secondary | ICD-10-CM

## 2020-07-09 DIAGNOSIS — K7460 Unspecified cirrhosis of liver: Secondary | ICD-10-CM

## 2020-07-09 DIAGNOSIS — J329 Chronic sinusitis, unspecified: Secondary | ICD-10-CM

## 2020-07-09 DIAGNOSIS — E119 Type 2 diabetes mellitus without complications: Secondary | ICD-10-CM

## 2020-07-09 DIAGNOSIS — Z8719 Personal history of other diseases of the digestive system: Secondary | ICD-10-CM

## 2020-07-09 DIAGNOSIS — K31819 Angiodysplasia of stomach and duodenum without bleeding: Secondary | ICD-10-CM

## 2020-07-09 DIAGNOSIS — R0600 Dyspnea, unspecified: Secondary | ICD-10-CM

## 2020-07-09 DIAGNOSIS — Z01810 Encounter for preprocedural cardiovascular examination: Secondary | ICD-10-CM

## 2020-07-09 DIAGNOSIS — Z20822 Encounter for screening laboratory testing for COVID-19 virus in asymptomatic patient: Secondary | ICD-10-CM

## 2020-07-09 DIAGNOSIS — E669 Obesity, unspecified: Secondary | ICD-10-CM

## 2020-07-10 ENCOUNTER — Ambulatory Visit: Admit: 2020-07-10 | Discharge: 2020-07-10 | Payer: MEDICARE

## 2020-07-10 DIAGNOSIS — K31819 Angiodysplasia of stomach and duodenum without bleeding: Secondary | ICD-10-CM

## 2020-07-10 DIAGNOSIS — Z8719 Personal history of other diseases of the digestive system: Secondary | ICD-10-CM

## 2020-07-11 ENCOUNTER — Encounter: Admit: 2020-07-11 | Discharge: 2020-07-11 | Payer: MEDICARE

## 2020-07-11 ENCOUNTER — Ambulatory Visit: Admit: 2020-07-11 | Discharge: 2020-07-11 | Payer: MEDICARE

## 2020-07-11 DIAGNOSIS — R945 Abnormal results of liver function studies: Secondary | ICD-10-CM

## 2020-07-11 DIAGNOSIS — K31819 Angiodysplasia of stomach and duodenum without bleeding: Secondary | ICD-10-CM

## 2020-07-11 DIAGNOSIS — D500 Iron deficiency anemia secondary to blood loss (chronic): Secondary | ICD-10-CM

## 2020-07-11 DIAGNOSIS — D649 Anemia, unspecified: Secondary | ICD-10-CM

## 2020-07-11 DIAGNOSIS — K7460 Unspecified cirrhosis of liver: Secondary | ICD-10-CM

## 2020-07-11 LAB — COMPREHENSIVE METABOLIC PANEL
Lab: 1.4 mg/dL — ABNORMAL HIGH (ref 0.4–1.00)
Lab: 1.9 mg/dL — ABNORMAL HIGH (ref 0.3–1.2)
Lab: 101 MMOL/L — ABNORMAL HIGH (ref 98–110)
Lab: 118 mg/dL — ABNORMAL HIGH (ref 70–100)
Lab: 130 MMOL/L — ABNORMAL LOW (ref 137–147)
Lab: 133 U/L — ABNORMAL HIGH (ref 25–110)
Lab: 2.9 g/dL — ABNORMAL LOW (ref 3.5–5.0)
Lab: 4.8 MMOL/L (ref 3.5–5.1)
Lab: 46 mg/dL — ABNORMAL HIGH (ref 7–25)
Lab: 49 U/L — ABNORMAL HIGH (ref 7–40)
Lab: 5.5 g/dL — ABNORMAL LOW (ref 6.0–8.0)
Lab: 8.3 mg/dL — ABNORMAL LOW (ref 8.5–10.6)

## 2020-07-11 LAB — CBC AND DIFF
Lab: 2.8 M/UL — ABNORMAL LOW (ref 4.0–5.0)
Lab: 20 % — ABNORMAL HIGH (ref 11–15)
Lab: 22 % — ABNORMAL LOW (ref 36–45)
Lab: 255 10*3/uL (ref 150–400)
Lab: 32 g/dL (ref 32.0–36.0)
Lab: 7 % — ABNORMAL LOW (ref 24–44)
Lab: 7.4 g/dL — ABNORMAL LOW (ref 12.0–15.0)
Lab: 79 % — ABNORMAL HIGH (ref 41–77)
Lab: 8.3 FL (ref 7–11)
Lab: 80 FL (ref 80–100)
Lab: 9.4 10*3/uL (ref 4.5–11.0)

## 2020-07-11 LAB — PROTIME INR (PT): Lab: 1.5 pg — ABNORMAL HIGH (ref 0.8–1.2)

## 2020-07-12 ENCOUNTER — Encounter: Admit: 2020-07-12 | Discharge: 2020-07-12 | Payer: MEDICARE

## 2020-07-13 ENCOUNTER — Encounter: Admit: 2020-07-13 | Discharge: 2020-07-13 | Payer: MEDICARE

## 2020-07-13 MED ORDER — MIDODRINE 10 MG PO TAB
ORAL_TABLET | Freq: Three times a day (TID) | 1 refills
Start: 2020-07-13 — End: ?

## 2020-07-14 ENCOUNTER — Encounter: Admit: 2020-07-14 | Discharge: 2020-07-14 | Payer: MEDICARE

## 2020-07-14 ENCOUNTER — Ambulatory Visit: Admit: 2020-07-14 | Discharge: 2020-07-14 | Payer: MEDICARE

## 2020-07-14 DIAGNOSIS — R945 Abnormal results of liver function studies: Secondary | ICD-10-CM

## 2020-07-14 DIAGNOSIS — K760 Fatty (change of) liver, not elsewhere classified: Secondary | ICD-10-CM

## 2020-07-14 DIAGNOSIS — I729 Aneurysm of unspecified site: Secondary | ICD-10-CM

## 2020-07-14 DIAGNOSIS — E669 Obesity, unspecified: Secondary | ICD-10-CM

## 2020-07-14 DIAGNOSIS — E119 Type 2 diabetes mellitus without complications: Secondary | ICD-10-CM

## 2020-07-14 DIAGNOSIS — D500 Iron deficiency anemia secondary to blood loss (chronic): Secondary | ICD-10-CM

## 2020-07-14 DIAGNOSIS — I10 Essential (primary) hypertension: Secondary | ICD-10-CM

## 2020-07-14 DIAGNOSIS — Z01810 Encounter for preprocedural cardiovascular examination: Secondary | ICD-10-CM

## 2020-07-14 DIAGNOSIS — K31819 Angiodysplasia of stomach and duodenum without bleeding: Secondary | ICD-10-CM

## 2020-07-14 DIAGNOSIS — K5730 Diverticulosis of large intestine without perforation or abscess without bleeding: Secondary | ICD-10-CM

## 2020-07-14 DIAGNOSIS — K7460 Unspecified cirrhosis of liver: Secondary | ICD-10-CM

## 2020-07-14 DIAGNOSIS — D649 Anemia, unspecified: Secondary | ICD-10-CM

## 2020-07-14 DIAGNOSIS — J329 Chronic sinusitis, unspecified: Secondary | ICD-10-CM

## 2020-07-14 DIAGNOSIS — R0600 Dyspnea, unspecified: Secondary | ICD-10-CM

## 2020-07-14 DIAGNOSIS — K7469 Other cirrhosis of liver: Secondary | ICD-10-CM

## 2020-07-14 LAB — COMPREHENSIVE METABOLIC PANEL
Lab: 1.7 mg/dL — ABNORMAL HIGH (ref 0.4–1.00)
Lab: 1.9 mg/dL — ABNORMAL HIGH (ref 0.3–1.2)
Lab: 101 MMOL/L (ref 98–110)
Lab: 131 MMOL/L — ABNORMAL LOW (ref 137–147)
Lab: 132 mg/dL — ABNORMAL HIGH (ref 70–100)
Lab: 142 U/L — ABNORMAL HIGH (ref 25–110)
Lab: 2.8 g/dL — ABNORMAL LOW (ref 3.5–5.0)
Lab: 20 MMOL/L — ABNORMAL LOW (ref 21–30)
Lab: 25 U/L (ref 7–56)
Lab: 30 mL/min — ABNORMAL LOW (ref >60)
Lab: 37 mL/min — ABNORMAL LOW (ref >60)
Lab: 4.5 MMOL/L (ref 3.5–5.1)
Lab: 44 mg/dL — ABNORMAL HIGH (ref 7–25)
Lab: 5.7 g/dL — ABNORMAL LOW (ref 6.0–8.0)
Lab: 53 U/L — ABNORMAL HIGH (ref 7–40)
Lab: 8.2 mg/dL — ABNORMAL LOW (ref 8.5–10.6)
Lab: 10 (ref 3–12)

## 2020-07-14 LAB — GRAM STAIN

## 2020-07-14 LAB — CBC AND DIFF
Lab: 0.1 10*3/uL (ref 0–0.20)
Lab: 0.1 10*3/uL (ref 0–0.45)
Lab: 0.6 10*3/uL — ABNORMAL LOW (ref 1.0–4.8)
Lab: 0.9 10*3/uL — ABNORMAL HIGH (ref 0–0.80)
Lab: 13 % — ABNORMAL HIGH (ref 4–12)
Lab: 2 % (ref 0–2)
Lab: 2 % (ref 0–5)
Lab: 2.8 M/UL — ABNORMAL LOW (ref 4.0–5.0)
Lab: 20 % — ABNORMAL HIGH (ref 11–15)
Lab: 22 % — ABNORMAL LOW (ref 36–45)
Lab: 25 pg — ABNORMAL LOW (ref 26–34)
Lab: 312 10*3/uL (ref 150–400)
Lab: 32 g/dL (ref 32.0–36.0)
Lab: 5.6 10*3/uL (ref 1.8–7.0)
Lab: 7.3 g/dL — ABNORMAL LOW (ref 12.0–15.0)
Lab: 7.5 10*3/uL (ref 4.5–11.0)
Lab: 74 % (ref 41–77)
Lab: 78 FL — ABNORMAL LOW (ref 80–100)
Lab: 8.3 FL (ref 7–11)
Lab: 9 % — ABNORMAL LOW (ref 24–44)

## 2020-07-14 LAB — PROTIME INR (PT): Lab: 1.5 — ABNORMAL HIGH (ref 0.8–1.2)

## 2020-07-14 MED ORDER — ALBUMIN, HUMAN 25 % IV SOLP
0 refills | Status: CP
Start: 2020-07-14 — End: ?

## 2020-07-14 NOTE — Patient Instructions
IR-PARACENTESIS   A paracentesis is the removal of an abnormal buildup of fluid in your abdominal cavity. This fluid buildup is called ascites and may be caused by conditions such as liver disease, heart failure, or cancer. During this procedure, a needle is inserted into your abdomen to drain the fluid. The fluid may then be sent to the lab for testing if medically indicated. Removal of the fluid may also relieve belly pressure and shortness of breath caused by the ascites.?  POST-PROCEDURE PAIN:   ? Pain control following your procedure is a priority for both you and your Physicians.  ? Some soreness or tenderness at the site is to be expected for several days. We recommend taking over the counter analgesics to help relieve this pain.  ? Alternative methods for pain relief include but not limited to heat or cold compress, relaxation techniques, rest, and changing of positions.  ? If pain continues after 5-7 days or you have severe pain not relieved by medication, please contact us as directed below.?  POST-PROCEDURE ACTIVITY:   ? A responsible adult must drive you home.  ? If you receive sedation, narcotic pain medication or anesthesia for the procedure, you should not drive or operate heavy machinery or do anything that requires concentration for at least 24 hours after procedure completion.  ? It is recommended that a responsible adult be with you until morning.?  POST-PROCEDURE SITE CARE:   ? You will have a small bandage over the procedure site. Keep this dry.  ? You may remove it in 24 hours.  ? You may shower in 24 hours, after removing the bandage.  ? A dry gauze bandage may be reapplied as necessary to protect your clothing as the site may sometimes leak for several days after the procedure.  ? Do not submerge the procedure site for 1 week (no bathtub, swimming, hot tub, etc.)  ? Do not use ointments, creams or powders on the puncture site.  ? Be sure your hands are clean when touching near the site.?  DIET/MEDICATIONS:   ? You may resume your previous diet after the procedure.  ? If you receive sedation or narcotic pain medications, avoid any foods or beverages containing alcohol for at least 24 hours after the procedure.  ? Please see the Medication Reconciliation sheet for instructions regarding resuming your home medications.?  CALL THE DOCTOR IF:   ? Bright red blood soaks the bandage.  ? You have pain not relieved by medication. Some soreness at the site is to be expected.  ? You have signs of infection such as: fever greater than 101F, chills, redness, warmth, swelling, drainage or pus from the puncture site.  For any of the above symptoms or for problems or concerns related to the procedure performed at the [facility], call [phone] Monday-Friday from 7-5p. After-hours and weekends, please call [phone] and ask for the Interventional Radiology Resident on-call.   You or your caregiver should call 911 for any severe symptoms such as excessive bleeding, severe dizziness, trouble breathing or loss of consciousness.   ?  ?

## 2020-07-14 NOTE — Other
Immediate Post Procedure Note    Date:  07/14/2020                                         Attending Physician: Nicholaus Corolla MD  Operating Physician: Wilkie Aye, M.D.    Consent:  Consent obtained from patient.  Time out performed: Consent obtained, correct patient verified, correct procedure verified, correct site verified, patient marked as necessary.  Pre/Post Procedure Diagnosis/Indication:  Cirrhosis with ascites    Anesthesia: local only  Procedure(s):  paracentesis. Please see separate PACS dictation for further detail.  Findings: large volume ascites    Estimated Blood Loss:  None/Negligible  Specimen(s) Removed/Disposition:  ascites  Complications: None  Patient Tolerated Procedure: Well  Post-Procedure Condition:  Stable

## 2020-07-14 NOTE — Telephone Encounter
Called patient to review recent labs and treatment plan after reviewing her chart with Dr. Okey Dupre.  No answer.  Left voicemail instructing patient to call back.    Please let patient know that due to her history of fractures over the past year and her decline in bone density scan we decided to switch her treatment to Tymlos.  This is a daily injection.  Please confirm that this patient has not had a history of radiation.  Please inquire if patient would like to have a nurses visit to review how to give this injection.  After discussing this with patient will plan to order Tymlos at that time.  It can be difficult to get covered so it may take time for her to receive this medication.    Also remind patient that she needs to be taking Tums daily as well as vitamin D supplementation at least 2000 IUs daily.

## 2020-07-14 NOTE — H&P (View-Only)
IR Pre-Procedure History and Physical/Sedation Plan    Procedure Date: 07/14/2020     Planned Procedure(s):  Ultrasound-guided paracentesis     Indication:  Fluid testing; Therapeutic drainage  __________________________________________________________________    Chief Complaint:  Ascites    History of Present Illness: Kristin Moody is a 64 y.o. female with a history as listed below who presents today for procedure.    Patient Active Problem List    Diagnosis Date Noted   ? Pleurodynia 06/04/2020   ? Pneumothorax 06/04/2020   ? 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 Hypotension 02/10/2020   ? 'Light-for-dates' infant with signs of fetal malnutrition 01/22/2020   ? 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 Anemia 09/28/2019   ? Encephalopathy 08/12/2019   ? Spasticity 08/10/2019   ? Hyperreflexia 08/10/2019   ? 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 Right leg weakness 05/14/2019   ? 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 Iron (Fe) deficiency anemia 01/19/2019   ? Preop cardiovascular exam 01/07/2019   ? Pre-transplant evaluation for liver transplant 01/06/2019   ? Cirrhosis (HCC) 12/16/2018   ? Acute on chronic anemia 12/16/2018   ? Lactic acidosis 12/16/2018   ? GI bleed 10/24/2018   ? Portal hypertension (HCC) 08/28/2018   ? Confusion 08/27/2018   ? Dyspnea 08/27/2018   ? Chronic abdominal pain 08/27/2018   ? 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 Acute on chronic blood loss anemia 08/27/2018   ? Iron deficiency anemia 04/13/2018   ? Pneumonia due to infectious organism 04/13/2018   ? Melena 04/10/2018   ? Esophageal varices without bleeding (HCC) 02/25/2018   ? 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 Lower abdominal pain 10/24/2014   ? Chest discomfort 09/21/2014   ? Essential hypertension 08/13/2012   ? Abnormal liver function tests 05/13/2011   ? Fatty liver disease, nonalcoholic 05/13/2011   ? Obesity (BMI 30-39.9) 05/13/2011   ? Slow transit constipation 05/13/2011     Medical History:   Diagnosis Date   ? Aneurysm (HCC)    ? 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 Diverticulosis of colon     descending and sigmoid colon   ? Dyspnea    ? Essential hypertension 08/13/2012   ? Fatty infiltration of liver    ? HTN (hypertension)    ? Obesity (BMI 30-39.9) 05/13/2011   ? Preop cardiovascular exam 01/07/2019   ? Sinus infection    ? Type 2 diabetes mellitus (HCC) 09/21/2014      Surgical History:   Procedure Laterality Date   ? HYSTERECTOMY  1994   ? UPPER GASTROINTESTINAL ENDOSCOPY  2009   ? COLONOSCOPY  2009   ? CYSTOCELE REPAIR  2009    with endocele repair   ? RECTOCELE REPAIR  03/2009   ? LIVER BIOPSY  07/17/2010   ? COLONOSCOPY N/A 11/08/2017    Performed by Dawna Part, MD at [facility] ENDO   ? ESOPHAGOGASTRODUODENOSCOPY N/A 11/08/2017    Performed by Dawna Part, MD at [facility] ENDO   ? ESOPHAGOGASTRODUODENOSCOPY WITH BIOPSY - FLEXIBLE  11/08/2017    Performed by Dawna Part, MD at [facility] ENDO   ? COLONOSCOPY WITH HOT BIOPSY FORCEPS REMOVAL TUMOR/ POLYP/ OTHER LESION  11/08/2017    Performed by Dawna Part, MD at [facility] ENDO   ? ESOPHAGOGASTRODUODENOSCOPY WITH BAND LIGATION ESOPHAGEAL/ GASTRIC  VARICES - FLEXIBLE N/A 04/10/2018    Performed by Normajean Baxter, MD at [facility] ENDO   ? ESOPHAGOGASTRODUODENOSCOPY WITH BIOPSY - FLEXIBLE N/A 04/10/2018    Performed by Normajean Baxter, MD at [facility] ENDO   ? ESOPHAGOGASTRODUODENOSCOPY WITH CONTROL OF BLEEDING - FLEXIBLE N/A 04/25/2018    Performed by Jolee Ewing, MD at [facility] ENDO   ? ESOPHAGOGASTRODUODENOSCOPY WITH BIOPSY - FLEXIBLE with push enteroscopy N/A 08/28/2018    Performed by Celesta Gentile, MD at [facility] ENDO   ? EGD N/A 10/27/2018    Performed by Eliott Nine, MD at [facility] ENDO   ? ESOPHAGOGASTRODUODENOSCOPY WITH SNARE REMOVAL TUMOR/ POLYP/ OTHER LESION - FLEXIBLE N/A 10/27/2018    Performed by Eliott Nine, MD at [facility] ENDO   ? ESOPHAGOGASTRODUODENOSCOPY WITH BIOPSY - FLEXIBLE N/A 12/16/2018    Performed by Buckles, Vinnie Level, MD at [facility] ENDO   ? ESOPHAGOGASTRODUODENOSCOPY WITH CONTROL OF BLEEDING - FLEXIBLE N/A 12/16/2018    Performed by Buckles, Vinnie Level, MD at [facility] ENDO   ? ESOPHAGOGASTRODUODENOSCOPY WITH SPECIMEN COLLECTION BY BRUSHING/ WASHING N/A 01/22/2019    Performed by Veneta Penton, MD at [facility] ENDO   ? ESOPHAGOGASTRODUODENOSCOPY WITH DILATION ESOPHAGUS WITH BALLOON 30 MM OR GREATER - FLEXIBLE N/A 01/22/2019    Performed by Veneta Penton, MD at [facility] ENDO   ? ESOPHAGOGASTRODUODENOSCOPY WITH BIOPSY - FLEXIBLE N/A 01/22/2019    Performed by Veneta Penton, MD at [facility] ENDO   ? ESOPHAGOGASTRODUODENOSCOPY WITH BIOPSY - FLEXIBLE N/A 06/19/2019    Performed by Dawna Part, MD at [facility] ENDO   ? ESOPHAGOGASTRODUODENOSCOPY [WITH APC]  WITH SPECIMEN COLLECTION BY BRUSHING/ WASHING N/A 08/07/2019    Performed by Dawna Part, MD at [facility] ENDO   ? ESOPHAGOGASTRODUODENOSCOPY WITH SPECIMEN COLLECTION BY BRUSHING/ WASHING N/A 09/10/2019    Performed by Buckles, Vinnie Level, MD at [facility] ENDO   ? ESOPHAGOGASTRODUODENOSCOPY WITH CONTROL OF BLEEDING - FLEXIBLE N/A 09/10/2019    Performed by Buckles, Vinnie Level, MD at [facility] ENDO   ? ESOPHAGOGASTRODUODENOSCOPY WITH CONTROL OF BLEEDING - FLEXIBLE N/A 02/12/2020    Performed by Lenor Derrick, MD at [facility] ENDO   ? ESOPHAGOGASTRODUODENOSCOPY WITH BIOPSY - FLEXIBLE N/A 02/26/2020    Performed by Buckles, Vinnie Level, MD at [facility] ENDO   ? ESOPHAGOGASTRODUODENOSCOPY WITH BIOPSY - FLEXIBLE N/A 05/18/2020    Performed by Lenor Derrick, MD at [facility] ENDO   ? ESOPHAGOGASTRODUODENOSCOPY WITH CONTROL OF BLEEDING - FLEXIBLE N/A 05/18/2020    Performed by Lenor Derrick, MD at [facility] ENDO   ? ESOPHAGOGASTRODUODENOSCOPY WITH CONTROL OF BLEEDING - FLEXIBLE N/A 07/08/2020    Performed by Vertell Novak, MD at [facility] ENDO   ? CHOLECYSTECTOMY     ? COLONOSCOPY     ? ESOPHAGOGASTRIC FUNDOPLICATION  2003, 2004    laparoscopic   ? ESOPHAGOGASTRIC FUNDOPLICATION     ? HX HYSTERECTOMY     ? PR ESOPHAGOSCOPY FLEXIBLE TRANSORAL DIAGNOSTIC        Social History     Tobacco Use   ? Smoking status: Never Smoker   ? Smokeless tobacco: Never Used   Substance Use Topics   ? Alcohol use: No      Family History   Problem Relation Age of Onset   ? Other Mother    ? Kidney Failure Mother    ? Osteoporosis Mother    ? Cancer Father         esophageal   ? Liver Disease Sister  endstage, s/p liver transplant   ? Cancer Brother         tonsil, liver    ? Hip Fracture Neg Hx       Medications Prior to Admission   Medication Sig Dispense Refill Last Dose   ? ciprofloxacin (CIPRO) 500 mg tablet Take one tablet by mouth daily. 30 tablet 1    ? duloxetine DR (CYMBALTA) 60 mg capsule Take 60 mg by mouth daily.      ? furosemide (LASIX) 40 mg tablet Take one-half tablet by mouth every morning. 90 tablet 0    ? lidocaine (ASPERCREME PATCH) 4 % topical patch Apply one each topically to affected area daily. 5 each 1    ? midodrine (PROAMITINE) 10 mg tablet TAKE 1 TABLET BY MOUTH THREE TIMES A DAY 90 tablet 1    ? oxyCODONE (ROXICODONE) 5 mg tablet Take one tablet by mouth every 6 hours as needed 20 tablet 0    ? pantoprazole DR (PROTONIX) 40 mg tablet TAKE 1 TABLET BY MOUTH TWICE A DAY 180 tablet 1    ? polyethylene glycol 3350 (MIRALAX) 17 g packet Take one packet by mouth twice daily.      ? promethazine (PHENERGAN) 25 mg tablet Take 25 mg by mouth every 4 hours as needed. Just uses it with Tramadol      ? rifAXIMin (XIFAXAN) 550 mg tablet Take one tablet by mouth twice daily. 180 tablet 3    ? zinc sulfate 220 mg (50 mg elemental zinc) capsule Take one capsule by mouth daily. 90 capsule 3      Allergies   Allergen Reactions   ? Tegaderm BLISTERS   ? Adhesive Tape (Rosins) RASH     Other reaction(s): irritated skin   ? Sulfa (Sulfonamide Antibiotics) RASH     Other reaction(s): Unknown Reaction   ? Codeine NAUSEA AND VOMITING   ? Morphine SEE COMMENTS     Pt reports burns her veins  Other reaction(s): Unknown Reaction   ? Penicillin G SEE COMMENTS     Throat issues, blisters in throat    ? Tramadol NAUSEA ONLY and ITCHING     Takes this medication regularly        Review of Systems  A comprehensive review of systems was negative except for: Gastrointestinal: positive for abdominal pain    Physical Exam:  Vital Signs: Last Filed In 24 Hours Vital Signs: 24 Hour Range                General appearance: alert and no distress noted.  Neurologic: Grossly normal.  Lungs: Non labored.  Heart: regular rate and rhythm  Abdomen: distended    Pre-procedure anxiolysis plan: N/A  Sedation/Medication Plan: Local anesthetic  Personal history of sedation complications: Denies adverse event.   Family history of sedation complications: Denies adverse event.   Medications for Reversal: NA  Discussion/Reviews:  Physician has discussed risks and alternatives of this type of sedation and above planned procedures with patient    NPO Status: NA  Airway:  NA  Head and Neck: NA  Mouth: NA   Anesthesia Classification:  ASA III (A patient with a severe systemic disease that limits activity, but is not incapacitating)  Pregnancy Status: Not Pregnant    Lab/Radiology/Other Diagnostic Tests:  Labs:  Pertinent labs reviewed           Kaitlyn Skowron P Divine-Thiele, APRN-NP  Pager [phone]

## 2020-07-14 NOTE — Progress Notes
Kristin Moody discharged on 07/14/2020.   Marland Kitchen  Discharge instructions reviewed with patient.  Valuables returned:   Personal Items / Valuables:  (all belongings)  Where Are Valuables Stored?: [address]  Home medications:    .  Functional assessment at discharge complete: Yes .      IV removed. Pt off unit via wheelchair. Discharged to home with husband.

## 2020-07-15 ENCOUNTER — Encounter: Admit: 2020-07-15 | Discharge: 2020-07-15 | Payer: MEDICARE

## 2020-07-18 ENCOUNTER — Encounter: Admit: 2020-07-18 | Discharge: 2020-07-18 | Payer: MEDICARE

## 2020-07-18 ENCOUNTER — Inpatient Hospital Stay: Admit: 2020-07-18 | Discharge: 2020-07-20 | Disposition: A | Discharge status: Home / Self Care | Payer: MEDICARE

## 2020-07-18 ENCOUNTER — Ambulatory Visit: Admit: 2020-07-18 | Discharge: 2020-07-18 | Payer: MEDICARE

## 2020-07-18 DIAGNOSIS — J95811 Postprocedural pneumothorax: Secondary | ICD-10-CM

## 2020-07-18 DIAGNOSIS — D500 Iron deficiency anemia secondary to blood loss (chronic): Secondary | ICD-10-CM

## 2020-07-18 DIAGNOSIS — K7460 Unspecified cirrhosis of liver: Secondary | ICD-10-CM

## 2020-07-18 DIAGNOSIS — R0600 Dyspnea, unspecified: Secondary | ICD-10-CM

## 2020-07-18 DIAGNOSIS — I729 Aneurysm of unspecified site: Secondary | ICD-10-CM

## 2020-07-18 DIAGNOSIS — E119 Type 2 diabetes mellitus without complications: Secondary | ICD-10-CM

## 2020-07-18 DIAGNOSIS — J329 Chronic sinusitis, unspecified: Secondary | ICD-10-CM

## 2020-07-18 DIAGNOSIS — D62 Acute posthemorrhagic anemia: Secondary | ICD-10-CM

## 2020-07-18 DIAGNOSIS — E669 Obesity, unspecified: Secondary | ICD-10-CM

## 2020-07-18 DIAGNOSIS — Z01810 Encounter for preprocedural cardiovascular examination: Secondary | ICD-10-CM

## 2020-07-18 DIAGNOSIS — K5730 Diverticulosis of large intestine without perforation or abscess without bleeding: Secondary | ICD-10-CM

## 2020-07-18 DIAGNOSIS — D649 Anemia, unspecified: Secondary | ICD-10-CM

## 2020-07-18 DIAGNOSIS — I10 Essential (primary) hypertension: Secondary | ICD-10-CM

## 2020-07-18 DIAGNOSIS — K760 Fatty (change of) liver, not elsewhere classified: Secondary | ICD-10-CM

## 2020-07-18 DIAGNOSIS — K31819 Angiodysplasia of stomach and duodenum without bleeding: Secondary | ICD-10-CM

## 2020-07-18 LAB — CBC AND DIFF
Lab: 0.1 10*3/uL (ref 0–0.20)
Lab: 0.1 10*3/uL (ref 0–0.45)
Lab: 0.6 10*3/uL — ABNORMAL LOW (ref 1.0–4.8)
Lab: 10 % (ref 4–12)
Lab: 17 % — ABNORMAL LOW (ref 36–45)
Lab: 2 % — ABNORMAL LOW (ref >60)
Lab: 2 % — ABNORMAL LOW (ref >60)
Lab: 2.3 M/UL — ABNORMAL LOW (ref 4.0–5.0)
Lab: 21 % — ABNORMAL HIGH (ref 11–15)
Lab: 24 pg — ABNORMAL LOW (ref 26–34)
Lab: 313 10*3/uL — ABNORMAL HIGH (ref 150–400)
Lab: 32 g/dL — ABNORMAL HIGH (ref 32.0–36.0)
Lab: 5.7 g/dL — CL (ref 12.0–15.0)
Lab: 6 10*3/uL (ref 1.8–7.0)
Lab: 7.6 10*3/uL (ref 4.5–11.0)
Lab: 75 FL — ABNORMAL LOW (ref 80–100)
Lab: 78 % — ABNORMAL HIGH (ref 41–77)
Lab: 8 % — ABNORMAL LOW (ref 24–44)
Lab: 8 FL — ABNORMAL HIGH (ref 7–11)

## 2020-07-18 LAB — PROTIME INR (PT): Lab: 1.6 — ABNORMAL HIGH (ref 0.8–1.2)

## 2020-07-18 LAB — COMPREHENSIVE METABOLIC PANEL
Lab: 129 MMOL/L — ABNORMAL LOW (ref 137–147)
Lab: 4.8 MMOL/L (ref 3.5–5.1)

## 2020-07-18 MED ORDER — DULOXETINE 60 MG PO CPDR
60 mg | Freq: Every day | ORAL | 0 refills | Status: DC
Start: 2020-07-18 — End: 2020-07-20
  Administered 2020-07-19 – 2020-07-20 (×2): 60 mg via ORAL

## 2020-07-18 MED ORDER — ALBUMIN, HUMAN 25 % IV SOLP
50 g | Freq: Once | INTRAVENOUS | 0 refills | Status: CP
Start: 2020-07-18 — End: ?
  Administered 2020-07-18: 50 g via INTRAVENOUS

## 2020-07-18 MED ORDER — ACETAMINOPHEN 325 MG PO TAB
650 mg | ORAL | 0 refills | Status: DC | PRN
Start: 2020-07-18 — End: 2020-07-19

## 2020-07-18 MED ORDER — MELATONIN 3 MG PO TAB
3 mg | Freq: Every evening | ORAL | 0 refills | Status: DC | PRN
Start: 2020-07-18 — End: 2020-07-20

## 2020-07-18 MED ORDER — RIFAXIMIN 550 MG PO TAB
550 mg | Freq: Two times a day (BID) | ORAL | 0 refills | Status: DC
Start: 2020-07-18 — End: 2020-07-20
  Administered 2020-07-19 – 2020-07-20 (×4): 550 mg via ORAL

## 2020-07-18 MED ORDER — POLYETHYLENE GLYCOL 3350 17 GRAM PO PWPK
1 | Freq: Every day | ORAL | 0 refills | Status: DC | PRN
Start: 2020-07-18 — End: 2020-07-20
  Administered 2020-07-19 – 2020-07-20 (×3): 17 g via ORAL

## 2020-07-18 MED ORDER — ONDANSETRON 4 MG PO TBDI
4 mg | ORAL | 0 refills | Status: DC | PRN
Start: 2020-07-18 — End: 2020-07-20
  Administered 2020-07-19: 14:00:00 4 mg via ORAL

## 2020-07-18 MED ORDER — CIPROFLOXACIN HCL 500 MG PO TAB
500 mg | Freq: Every day | ORAL | 0 refills | Status: CN
Start: 2020-07-18 — End: ?

## 2020-07-18 MED ORDER — PANTOPRAZOLE 40 MG IV SOLR
40 mg | Freq: Two times a day (BID) | INTRAVENOUS | 0 refills | Status: DC
Start: 2020-07-18 — End: 2020-07-20
  Administered 2020-07-19 – 2020-07-20 (×4): 40 mg via INTRAVENOUS

## 2020-07-18 MED ORDER — ONDANSETRON HCL (PF) 4 MG/2 ML IJ SOLN
4 mg | INTRAVENOUS | 0 refills | Status: DC | PRN
Start: 2020-07-18 — End: 2020-07-20

## 2020-07-18 MED ORDER — MIDODRINE 5 MG PO TAB
10 mg | Freq: Three times a day (TID) | ORAL | 0 refills | Status: DC
Start: 2020-07-18 — End: 2020-07-20
  Administered 2020-07-19 – 2020-07-20 (×6): 10 mg via ORAL

## 2020-07-18 MED ORDER — CEFTRIAXONE INJ 1GM IVP
1 g | INTRAVENOUS | 0 refills | Status: DC
Start: 2020-07-18 — End: 2020-07-20
  Administered 2020-07-18 – 2020-07-19 (×2): 1 g via INTRAVENOUS

## 2020-07-18 MED ORDER — FUROSEMIDE 20 MG PO TAB
20 mg | Freq: Every morning | ORAL | 0 refills | Status: DC
Start: 2020-07-18 — End: 2020-07-19

## 2020-07-18 MED ORDER — BISACODYL 10 MG RE SUPP
10 mg | Freq: Every day | RECTAL | 0 refills | Status: DC | PRN
Start: 2020-07-18 — End: 2020-07-20

## 2020-07-18 NOTE — H&P (View-Only)
Name:  Kristin Moody                                             MRN:  [id number]   Admission Date:  07/18/2020                     Assessment/Plan:        Acute on chronic blood loss anemia  - presents with outpatient Hg of 5.7, melena  - last egd 05/18/20:?Grade I and small (< 5 mm) esophageal varices. Gastric antral vascular ectasia with bleeding. Treated with argon plasma coagulation (APC).  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   -?next paracentesis on thursday  - continue PTA rifaximin andn miralax  - continue PTAMidodirne 10 mg TID.   - hepatology consulted  ?  AKI on CKD stage 3  - baseline cr <1.5  - presents with cr of 1.79  - give albumin 25% 50 gm x 1  - avoid nephrotoxins    ______________________________________________________________________________    Primary Care Physician: Concha Norway     Chief Complaint: anemia requiring transfusion     History of Present Illness: Kristin Moody is a 64 y.o. female with PMH of as below presented to ed with cc of low Hg, she was sent to ed because she ws found to have Hg of 5.7. She is [facility] patient on transplant list, reports melena for long period of time, denies any hematochezia, denies any nausea, vomiting, in ed she was ordered to be transfused one unit of PRBC. She denies any dizziness, syncope and chest pains.     Medical History:   Diagnosis Date   ? Aneurysm (HCC)    ? 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 Diverticulosis of colon     descending and sigmoid colon   ? Dyspnea    ? Essential hypertension 08/13/2012   ? Fatty infiltration of liver    ? HTN (hypertension)    ? Obesity (BMI 30-39.9) 05/13/2011   ? Preop cardiovascular exam 01/07/2019   ? Sinus infection    ? Type 2 diabetes mellitus (HCC) 09/21/2014     Surgical History:   Procedure Laterality Date   ? HYSTERECTOMY  1994   ? UPPER GASTROINTESTINAL ENDOSCOPY  2009   ? COLONOSCOPY  2009   ? CYSTOCELE REPAIR  2009    with endocele repair   ? RECTOCELE REPAIR  03/2009   ? LIVER BIOPSY  07/17/2010   ? COLONOSCOPY N/A 11/08/2017    Performed by Dawna Part, MD at [facility] ENDO   ? ESOPHAGOGASTRODUODENOSCOPY N/A 11/08/2017    Performed by Dawna Part, MD at [facility] ENDO   ? ESOPHAGOGASTRODUODENOSCOPY WITH BIOPSY - FLEXIBLE  11/08/2017    Performed by Dawna Part, MD at [facility] ENDO   ? COLONOSCOPY WITH HOT BIOPSY FORCEPS REMOVAL TUMOR/ POLYP/ OTHER LESION  11/08/2017    Performed by Dawna Part, MD at [facility] ENDO   ? ESOPHAGOGASTRODUODENOSCOPY WITH BAND LIGATION ESOPHAGEAL/ GASTRIC VARICES - FLEXIBLE N/A 04/10/2018    Performed by Normajean Baxter, MD at [facility] ENDO   ? ESOPHAGOGASTRODUODENOSCOPY WITH BIOPSY - FLEXIBLE N/A 04/10/2018  Performed by Normajean Baxter, MD at [facility] ENDO   ? ESOPHAGOGASTRODUODENOSCOPY WITH CONTROL OF BLEEDING - FLEXIBLE N/A 04/25/2018    Performed by Jolee Ewing, MD at [facility] ENDO   ? ESOPHAGOGASTRODUODENOSCOPY WITH BIOPSY - FLEXIBLE with push enteroscopy N/A 08/28/2018    Performed by Celesta Gentile, MD at [facility] ENDO   ? EGD N/A 10/27/2018    Performed by Eliott Nine, MD at [facility] ENDO   ? ESOPHAGOGASTRODUODENOSCOPY WITH SNARE REMOVAL TUMOR/ POLYP/ OTHER LESION - FLEXIBLE N/A 10/27/2018    Performed by Eliott Nine, MD at [facility] ENDO   ? ESOPHAGOGASTRODUODENOSCOPY WITH BIOPSY - FLEXIBLE N/A 12/16/2018    Performed by Buckles, Vinnie Level, MD at [facility] ENDO   ? ESOPHAGOGASTRODUODENOSCOPY WITH CONTROL OF BLEEDING - FLEXIBLE N/A 12/16/2018    Performed by Buckles, Vinnie Level, MD at [facility] ENDO   ? ESOPHAGOGASTRODUODENOSCOPY WITH SPECIMEN COLLECTION BY BRUSHING/ WASHING N/A 01/22/2019    Performed by Veneta Penton, MD at [facility] ENDO   ? ESOPHAGOGASTRODUODENOSCOPY WITH DILATION ESOPHAGUS WITH BALLOON 30 MM OR GREATER - FLEXIBLE N/A 01/22/2019    Performed by Veneta Penton, MD at [facility] ENDO   ? ESOPHAGOGASTRODUODENOSCOPY WITH BIOPSY - FLEXIBLE N/A 01/22/2019    Performed by Veneta Penton, MD at [facility] ENDO   ? ESOPHAGOGASTRODUODENOSCOPY WITH BIOPSY - FLEXIBLE N/A 06/19/2019    Performed by Dawna Part, MD at [facility] ENDO   ? ESOPHAGOGASTRODUODENOSCOPY [WITH APC]  WITH SPECIMEN COLLECTION BY BRUSHING/ WASHING N/A 08/07/2019    Performed by Dawna Part, MD at [facility] ENDO   ? ESOPHAGOGASTRODUODENOSCOPY WITH SPECIMEN COLLECTION BY BRUSHING/ WASHING N/A 09/10/2019    Performed by Buckles, Vinnie Level, MD at [facility] ENDO   ? ESOPHAGOGASTRODUODENOSCOPY WITH CONTROL OF BLEEDING - FLEXIBLE N/A 09/10/2019    Performed by Buckles, Vinnie Level, MD at [facility] ENDO   ? ESOPHAGOGASTRODUODENOSCOPY WITH CONTROL OF BLEEDING - FLEXIBLE N/A 02/12/2020    Performed by Lenor Derrick, MD at [facility] ENDO   ? ESOPHAGOGASTRODUODENOSCOPY WITH BIOPSY - FLEXIBLE N/A 02/26/2020    Performed by Buckles, Vinnie Level, MD at [facility] ENDO   ? ESOPHAGOGASTRODUODENOSCOPY WITH BIOPSY - FLEXIBLE N/A 05/18/2020    Performed by Lenor Derrick, MD at [facility] ENDO   ? ESOPHAGOGASTRODUODENOSCOPY WITH CONTROL OF BLEEDING - FLEXIBLE N/A 05/18/2020    Performed by Lenor Derrick, MD at [facility] ENDO   ? ESOPHAGOGASTRODUODENOSCOPY WITH CONTROL OF BLEEDING - FLEXIBLE N/A 07/08/2020    Performed by Vertell Novak, MD at [facility] ENDO   ? CHOLECYSTECTOMY     ? COLONOSCOPY     ? ESOPHAGOGASTRIC FUNDOPLICATION  2003, 2004    laparoscopic   ? ESOPHAGOGASTRIC FUNDOPLICATION     ? HX HYSTERECTOMY     ? PR ESOPHAGOSCOPY FLEXIBLE TRANSORAL DIAGNOSTIC       Family History   Problem Relation Age of Onset   ? Other Mother    ? Kidney Failure Mother    ? Osteoporosis Mother    ? Cancer Father         esophageal   ? Liver Disease Sister         endstage, s/p liver transplant   ? Cancer Brother         tonsil, liver    ? Hip Fracture Neg Hx      Social History     Socioeconomic History   ? Marital status: Married     Spouse name: [name]   ? Number of children: 1   ? Years  of education: Not on file   ? Highest education level: Not on file   Occupational History   ? Occupation: Geographical information systems officer: EMPLOYED   Tobacco Use   ? Smoking status: Never Smoker   ? Smokeless tobacco: Never Used   Vaping Use   ? Vaping Use: Never used   Substance and Sexual Activity   ? Alcohol use: No   ? Drug use: No   ? Sexual activity: Yes     Partners: Male     Birth control/protection: Other   Other Topics Concern   ? Not on file   Social History Narrative    Negative  For tobacco, alcohol, or illicit drug use.  No high risk sexual behavior or tattoo placement and no transfusion prior to 1992.  Married, one child healthy.  Works in Radio broadcast assistant.    Works in [employer] (includes history and patient reported):   Immunization History   Administered Date(s) Administered   ? Flu Vaccine =>6 Months Quadrivalent PF 08/27/2017, 08/13/2019   ? Hepatitis A vaccine Adult IM 08/13/2013           Allergies:  Tegaderm, Adhesive tape (rosins), Sulfa (sulfonamide antibiotics), Codeine, Morphine, Penicillin g, and Tramadol    Medications:  (Not in a hospital admission)    Review of Systems:  A comprehensive  12 point review of organ systems reviewed and was negative except for the ones mentioned in HOPI    Physical Exam:  Vital Signs: Last Filed In 24 Hours Vital Signs: 24 Hour Range   BP: 106/47 (08/30 1431)  Temp: 36.7 ?C (98.1 ?F) (08/30 1431)  Pulse: 97 (08/30 1431)  Respirations: 20 PER MINUTE (08/30 1431)  SpO2: 99 % (08/30 1431)  SpO2 Pulse: 97 (08/30 1431)  Height: 160 cm (63) (08/30 1431) BP: (106)/(47)   Temp:  [36.7 ?C (98.1 ?F)]   Pulse:  [97]   Respirations:  [20 PER MINUTE]   SpO2:  [99 %]           General:  Alert, awake, oriented x 3 , cooperative, no distress, appears stated age  Head:  Normocephalic, without obvious abnormality, atraumatic  Eyes:  Conjunctivae/corneas clear   Nose: Nares normal. Mucosa normal.  No drainage or sinus tenderness  Throat: Lips, mucosa and tongue normal  Neck:    Supple, symmetrical, trachea midline, no adenopathy, thyroid: no enlargement/tenderness/nodules  Lungs:  Clear to auscultation bilaterally  Heart:   Regular rate and rhythm, S1, S2 normal, no murmur  Abdomen:  Soft, non-tender.  Bowel sounds normal.  No masses.  No organomegaly.  Extremities: Extremities normal, atraumatic, no cyanosis or edema  Skin: Skin color, texture, turgor normal.    Lymph nodes:  Cervical, supraclavicular and axillary nodes normal  Neurologic: Non focal grossly    Lab/Radiology/Other Diagnostic Tests:  24-hour labs:  No results found for this visit on 07/18/20 (from the past 24 hour(s)).     Pertinent radiology reviewed.    No results found.

## 2020-07-18 NOTE — Telephone Encounter
received hgb results from todays' labs. hgb dropped from 7.3 on 8/26 to 5.7 today. Patient denies any s/s of bleeding. Outpatient transfusion center will not transfuse below 6.  Patient advised to be treated in ER for blood transfusion.  ER Triage notified, spoke to Arab. Patient v/u of plan

## 2020-07-18 NOTE — ED Provider Notes
MALIAKA Moody is a 64 y.o. female.    Chief Complaint:  Chief Complaint   Patient presents with   ? 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  Due to the abnormal value of 5.7, outpatient transfusion center unable to transfuse less than 6 and patient was requested to go to the emergency department for transfusion.  She says she otherwise feels well, has no signs or symptoms of bleeding. Denies any dark or black stool. She is not nauseous or vomiting blood. She says she often leaks from her liver. She says that transfusions are not uncommon for her, she gets routine labs every few days and paracentesis scheduled every couple of weeks with last being 8/27.       Review of Systems:  Review of Systems   Constitutional: Negative for fever.   HENT: Negative for sore throat.    Eyes: Negative for visual disturbance.   Respiratory: Negative for shortness of breath.    Cardiovascular: Negative for chest pain.   Gastrointestinal: Positive for abdominal distention. Negative for abdominal pain.   Genitourinary: Negative for dysuria.   Musculoskeletal: Negative for back pain.   Skin: Negative for rash.   Neurological: Positive for light-headedness. Negative for headaches.       Allergies:  Tegaderm, Adhesive tape (rosins), Sulfa (sulfonamide antibiotics), Codeine, Morphine, Penicillin g, and Tramadol    Past Medical History:  Medical History:   Diagnosis Date   ? Aneurysm (HCC)    ? 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 Diverticulosis of colon     descending and sigmoid colon   ? Dyspnea    ? Essential hypertension 08/13/2012   ? Fatty infiltration of liver    ? HTN (hypertension)    ? Obesity (BMI 30-39.9) 05/13/2011   ? Preop cardiovascular exam 01/07/2019   ? Sinus infection    ? Type 2 diabetes mellitus (HCC) 09/21/2014       Past Surgical History:  Surgical History:   Procedure Laterality Date   ? HYSTERECTOMY  1994   ? UPPER GASTROINTESTINAL ENDOSCOPY  2009   ? COLONOSCOPY  2009   ? CYSTOCELE REPAIR  2009    with endocele repair   ? RECTOCELE REPAIR  03/2009   ? LIVER BIOPSY  07/17/2010   ? COLONOSCOPY N/A 11/08/2017    Performed by Dawna Part, MD at [facility] ENDO   ? ESOPHAGOGASTRODUODENOSCOPY N/A 11/08/2017    Performed by Dawna Part, MD at [facility] ENDO   ? ESOPHAGOGASTRODUODENOSCOPY WITH BIOPSY - FLEXIBLE  11/08/2017    Performed by Dawna Part, MD at [facility] ENDO   ? COLONOSCOPY WITH HOT BIOPSY FORCEPS REMOVAL TUMOR/ POLYP/ OTHER LESION  11/08/2017    Performed by Dawna Part, MD at [facility] ENDO   ? ESOPHAGOGASTRODUODENOSCOPY WITH BAND LIGATION ESOPHAGEAL/ GASTRIC VARICES - FLEXIBLE N/A 04/10/2018    Performed by Normajean Baxter, MD at [facility] ENDO   ? ESOPHAGOGASTRODUODENOSCOPY WITH BIOPSY - FLEXIBLE N/A 04/10/2018    Performed by Normajean Baxter, MD at [facility] ENDO   ?  ESOPHAGOGASTRODUODENOSCOPY WITH CONTROL OF BLEEDING - FLEXIBLE N/A 04/25/2018    Performed by Jolee Ewing, MD at [facility] ENDO   ? ESOPHAGOGASTRODUODENOSCOPY WITH BIOPSY - FLEXIBLE with push enteroscopy N/A 08/28/2018    Performed by Celesta Gentile, MD at [facility] ENDO   ? EGD N/A 10/27/2018    Performed by Eliott Nine, MD at [facility] ENDO   ? ESOPHAGOGASTRODUODENOSCOPY WITH SNARE REMOVAL TUMOR/ POLYP/ OTHER LESION - FLEXIBLE N/A 10/27/2018    Performed by Eliott Nine, MD at [facility] ENDO   ? ESOPHAGOGASTRODUODENOSCOPY WITH BIOPSY - FLEXIBLE N/A 12/16/2018    Performed by Buckles, Vinnie Level, MD at [facility] ENDO   ? ESOPHAGOGASTRODUODENOSCOPY WITH CONTROL OF BLEEDING - FLEXIBLE N/A 12/16/2018    Performed by Buckles, Vinnie Level, MD at [facility] ENDO   ? ESOPHAGOGASTRODUODENOSCOPY WITH SPECIMEN COLLECTION BY BRUSHING/ WASHING N/A 01/22/2019    Performed by Veneta Penton, MD at [facility] ENDO   ? ESOPHAGOGASTRODUODENOSCOPY WITH DILATION ESOPHAGUS WITH BALLOON 30 MM OR GREATER - FLEXIBLE N/A 01/22/2019    Performed by Veneta Penton, MD at [facility] ENDO   ? ESOPHAGOGASTRODUODENOSCOPY WITH BIOPSY - FLEXIBLE N/A 01/22/2019    Performed by Veneta Penton, MD at [facility] ENDO   ? ESOPHAGOGASTRODUODENOSCOPY WITH BIOPSY - FLEXIBLE N/A 06/19/2019    Performed by Dawna Part, MD at [facility] ENDO   ? ESOPHAGOGASTRODUODENOSCOPY [WITH APC]  WITH SPECIMEN COLLECTION BY BRUSHING/ WASHING N/A 08/07/2019    Performed by Dawna Part, MD at [facility] ENDO   ? ESOPHAGOGASTRODUODENOSCOPY WITH SPECIMEN COLLECTION BY BRUSHING/ WASHING N/A 09/10/2019    Performed by Buckles, Vinnie Level, MD at [facility] ENDO   ? ESOPHAGOGASTRODUODENOSCOPY WITH CONTROL OF BLEEDING - FLEXIBLE N/A 09/10/2019    Performed by Buckles, Vinnie Level, MD at [facility] ENDO   ? ESOPHAGOGASTRODUODENOSCOPY WITH CONTROL OF BLEEDING - FLEXIBLE N/A 02/12/2020    Performed by Lenor Derrick, MD at [facility] ENDO   ? ESOPHAGOGASTRODUODENOSCOPY WITH BIOPSY - FLEXIBLE N/A 02/26/2020    Performed by Buckles, Vinnie Level, MD at [facility] ENDO   ? ESOPHAGOGASTRODUODENOSCOPY WITH BIOPSY - FLEXIBLE N/A 05/18/2020    Performed by Lenor Derrick, MD at [facility] ENDO   ? ESOPHAGOGASTRODUODENOSCOPY WITH CONTROL OF BLEEDING - FLEXIBLE N/A 05/18/2020    Performed by Lenor Derrick, MD at [facility] ENDO   ? ESOPHAGOGASTRODUODENOSCOPY WITH CONTROL OF BLEEDING - FLEXIBLE N/A 07/08/2020    Performed by Vertell Novak, MD at [facility] ENDO   ? CHOLECYSTECTOMY     ? COLONOSCOPY     ? ESOPHAGOGASTRIC FUNDOPLICATION  2003, 2004    laparoscopic   ? ESOPHAGOGASTRIC FUNDOPLICATION     ? HX HYSTERECTOMY     ? PR ESOPHAGOSCOPY FLEXIBLE TRANSORAL DIAGNOSTIC         Pertinent medical/surgical history reviewed  Medical History:   Diagnosis Date   ? Aneurysm (HCC)    ? 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 Diverticulosis of colon     descending and sigmoid colon   ? Dyspnea    ? Essential hypertension 08/13/2012   ? Fatty infiltration of liver    ? HTN (hypertension)    ? Obesity (BMI 30-39.9) 05/13/2011   ? Preop cardiovascular exam 01/07/2019   ? Sinus infection    ? Type 2 diabetes mellitus (HCC) 09/21/2014     Surgical History:   Procedure Laterality Date   ? HYSTERECTOMY  1994   ? UPPER GASTROINTESTINAL ENDOSCOPY  2009   ? COLONOSCOPY  2009   ? CYSTOCELE REPAIR  2009    with endocele repair   ? RECTOCELE REPAIR  03/2009   ? LIVER BIOPSY  07/17/2010   ? COLONOSCOPY N/A 11/08/2017    Performed by Dawna Part, MD at [facility] ENDO   ? ESOPHAGOGASTRODUODENOSCOPY N/A 11/08/2017    Performed by Dawna Part, MD at [facility] ENDO   ? ESOPHAGOGASTRODUODENOSCOPY WITH BIOPSY - FLEXIBLE  11/08/2017    Performed by Dawna Part, MD at [facility] ENDO   ? COLONOSCOPY WITH HOT BIOPSY FORCEPS REMOVAL TUMOR/ POLYP/ OTHER LESION  11/08/2017    Performed by Dawna Part, MD at [facility] ENDO   ? ESOPHAGOGASTRODUODENOSCOPY WITH BAND LIGATION ESOPHAGEAL/ GASTRIC VARICES - FLEXIBLE N/A 04/10/2018    Performed by Normajean Baxter, MD at [facility] ENDO   ? ESOPHAGOGASTRODUODENOSCOPY WITH BIOPSY - FLEXIBLE N/A 04/10/2018    Performed by Normajean Baxter, MD at [facility] ENDO   ? ESOPHAGOGASTRODUODENOSCOPY WITH CONTROL OF BLEEDING - FLEXIBLE N/A 04/25/2018    Performed by Jolee Ewing, MD at [facility] ENDO   ? ESOPHAGOGASTRODUODENOSCOPY WITH BIOPSY - FLEXIBLE with push enteroscopy N/A 08/28/2018    Performed by Celesta Gentile, MD at [facility] ENDO   ? EGD N/A 10/27/2018    Performed by Eliott Nine, MD at [facility] ENDO   ? ESOPHAGOGASTRODUODENOSCOPY WITH SNARE REMOVAL TUMOR/ POLYP/ OTHER LESION - FLEXIBLE N/A 10/27/2018    Performed by Eliott Nine, MD at [facility] ENDO   ? ESOPHAGOGASTRODUODENOSCOPY WITH BIOPSY - FLEXIBLE N/A 12/16/2018    Performed by Buckles, Vinnie Level, MD at [facility] ENDO   ? ESOPHAGOGASTRODUODENOSCOPY WITH CONTROL OF BLEEDING - FLEXIBLE N/A 12/16/2018    Performed by Buckles, Vinnie Level, MD at [facility] ENDO   ? ESOPHAGOGASTRODUODENOSCOPY WITH SPECIMEN COLLECTION BY BRUSHING/ WASHING N/A 01/22/2019    Performed by Veneta Penton, MD at [facility] ENDO   ? ESOPHAGOGASTRODUODENOSCOPY WITH DILATION ESOPHAGUS WITH BALLOON 30 MM OR GREATER - FLEXIBLE N/A 01/22/2019    Performed by Veneta Penton, MD at [facility] ENDO   ? ESOPHAGOGASTRODUODENOSCOPY WITH BIOPSY - FLEXIBLE N/A 01/22/2019    Performed by Veneta Penton, MD at [facility] ENDO   ? ESOPHAGOGASTRODUODENOSCOPY WITH BIOPSY - FLEXIBLE N/A 06/19/2019    Performed by Dawna Part, MD at [facility] ENDO   ? ESOPHAGOGASTRODUODENOSCOPY [WITH APC]  WITH SPECIMEN COLLECTION BY BRUSHING/ WASHING N/A 08/07/2019    Performed by Dawna Part, MD at [facility] ENDO   ? ESOPHAGOGASTRODUODENOSCOPY WITH SPECIMEN COLLECTION BY BRUSHING/ WASHING N/A 09/10/2019    Performed by Buckles, Vinnie Level, MD at [facility] ENDO   ? ESOPHAGOGASTRODUODENOSCOPY WITH CONTROL OF BLEEDING - FLEXIBLE N/A 09/10/2019    Performed by Buckles, Vinnie Level, MD at [facility] ENDO   ? ESOPHAGOGASTRODUODENOSCOPY WITH CONTROL OF BLEEDING - FLEXIBLE N/A 02/12/2020    Performed by Lenor Derrick, MD at [facility] ENDO   ? ESOPHAGOGASTRODUODENOSCOPY WITH BIOPSY - FLEXIBLE N/A 02/26/2020    Performed by Buckles, Vinnie Level, MD at [facility] ENDO   ? ESOPHAGOGASTRODUODENOSCOPY WITH BIOPSY - FLEXIBLE N/A 05/18/2020    Performed by Lenor Derrick, MD at [facility] ENDO   ? ESOPHAGOGASTRODUODENOSCOPY WITH CONTROL OF BLEEDING - FLEXIBLE N/A 05/18/2020    Performed by Lenor Derrick, MD at [facility] ENDO   ? ESOPHAGOGASTRODUODENOSCOPY WITH CONTROL OF BLEEDING - FLEXIBLE N/A 07/08/2020    Performed by Vertell Novak, MD at [facility] ENDO   ? CHOLECYSTECTOMY     ? COLONOSCOPY     ? ESOPHAGOGASTRIC FUNDOPLICATION  2003, 2004  laparoscopic   ? ESOPHAGOGASTRIC FUNDOPLICATION     ? HX HYSTERECTOMY     ? PR ESOPHAGOSCOPY FLEXIBLE TRANSORAL DIAGNOSTIC         Social History:  Social History     Tobacco Use   ? Smoking status: Never Smoker   ? Smokeless tobacco: Never Used   Vaping Use   ? Vaping Use: Never used   Substance Use Topics   ? Alcohol use: No   ? Drug use: No     Social History     Substance and Sexual Activity   Drug Use No             Family History:  Family History   Problem Relation Age of Onset   ? Other Mother    ? Kidney Failure Mother    ? Osteoporosis Mother    ? Cancer Father         esophageal   ? Liver Disease Sister         endstage, s/p liver transplant   ? Cancer Brother         tonsil, liver    ? Hip Fracture Neg Hx        Vitals:  ED Vitals    Date and Time T BP P RR SPO2P SPO2 User   07/18/20 1431 36.7 ?C (98.1 ?F) 106/47 97 20 PER MINUTE 97 99 % CM          Physical Exam:  Physical Exam  Vitals and nursing note reviewed.   Constitutional:       Appearance: Normal appearance. She is normal weight.   HENT:      Head: Normocephalic and atraumatic.   Eyes:      Extraocular Movements: Extraocular movements intact.      Conjunctiva/sclera: Conjunctivae normal.   Cardiovascular:      Rate and Rhythm: Normal rate and regular rhythm.   Pulmonary:      Effort: Pulmonary effort is normal.      Breath sounds: Normal breath sounds.   Abdominal:      General: Bowel sounds are normal. There is no distension.      Palpations: Abdomen is soft.      Tenderness: There is no abdominal tenderness.   Musculoskeletal:         General: Normal range of motion.      Cervical back: Neck supple.   Neurological:      Mental Status: She is oriented to person, place, and time. Mental status is at baseline.   Psychiatric:         Mood and Affect: Mood normal.         Behavior: Behavior normal.         Laboratory Results:  Labs Reviewed   COVID-19 (SARS-COV-2) PCR   BLUE TOP TUBE   MINT TOP TUBE   GOLD TOP TUBE   LAVENDER TOP TUBE   PROTIME INR (PT)   PREPARE RBC'S   TYPE & CROSSMATCH          Radiology Interpretation:    Not done.     EKG:    Not done    ED Course:  - Patient seen and examined by resident and attending for Hgb 5.7 on morning labs.   - Vitals signs were Initially stable.   Vitals:    07/18/20 1431   BP: 106/47   BP Source: Arm, Left Upper   Pulse: 97   Temp: 36.7 ?C (98.1 ?F) SpO2:  99%   Weight: 63.5 kg (140 lb)   Height: 160 cm (63)       - On physical exam, patient lightheaded with slightly distended abdomen, otherwise without acute signs and symptoms of blood loss.     - Differential diagnosis includes acute gi bleed vs chronic blood loss vs continued manifestation of chronic liver disease.   - Initial interventions started. Consented for blood.   - Workup including Type and crossmatch, 1 Unit pRBCs ordered, likely two needed.   - Labs were done earlier today at 1130, PT was ordered in the ED.   MELD-Na score: 22 at 07/14/2020  8:40 AM  MELD score: 18 at 07/14/2020  8:40 AM  Calculated from:  Serum Creatinine: 1.71 MG/DL at 1/61/0960  4:54 AM  Serum Sodium: 131 MMOL/L at 07/14/2020  8:40 AM  Total Bilirubin: 1.9 MG/DL at 0/98/1191  4:78 AM  INR(ratio): 1.5 at 07/14/2020  8:40 AM  Age: 66 years    - Imaging Not done  - Upon reassessment patient agreeable to admission due to duration of transfusion needed and need to ensure stability following transfusion.       - Patient was admitted to AOD for further management of acute blood loss.          ED Scoring:                             MDM  Reviewed: previous chart, nursing note and vitals  Reviewed previous: labs  Interpretation: labs  Consults: Admitting Provider        Facility Administered Meds:  Medications - No data to display      Clinical Impression:  Clinical Impression   Low hemoglobin       Disposition/Follow up  ED Disposition     ED Disposition    Admit        No follow-up provider specified.    Medications:  New Prescriptions    No medications on file       Procedure Notes:  Procedures      Attestation / Supervision:    Karle Plumber, MD   Family Medicine PGY-3  [facility]    Attestation / Supervision Note concerning Donnalee Curry: I personally performed the key portions of the E/M visit, discussed case with resident and concur with resident documentation of history, physical exam, assessment, and treatment plan unless otherwise noted.    Maximino Sarin, MD

## 2020-07-18 NOTE — ED Notes
Clothing:  Jeans shirt bra socke  Shoes:  Black shoes   Jewelry:  none  Identification/Drivers license:  Pt has her wallet and is responsible for all belongings   [name]:  Husband will take responsibility for her purse  Credit cards:  See above noteElectronics:  Cell phone  Dentures/Glasses/Hearing aids: dentures glasses  Assistive devices:  none  Other:  Purse husband has it        Belongings disposition:  with patient at bedside

## 2020-07-18 NOTE — Telephone Encounter
Patient contacted, LVM x2 regarding repeat labs today to follow-up with Hgb to determine transfusion needs, requested return call. MyChart message sent to patient.

## 2020-07-18 NOTE — ED Notes
Report to [facility] RN  Waiting for transport

## 2020-07-18 NOTE — Telephone Encounter
Pt called in regards to her My Chart message. She wanted to let you know she is currently on her way to get blood work done.

## 2020-07-19 ENCOUNTER — Encounter: Admit: 2020-07-19 | Discharge: 2020-07-19 | Payer: MEDICARE

## 2020-07-19 LAB — COVID-19 (SARS-COV-2) PCR

## 2020-07-19 MED ORDER — ALBUMIN, HUMAN 25 % IV SOLP
25 g | Freq: Once | INTRAVENOUS | 0 refills | Status: CP
Start: 2020-07-19 — End: ?
  Administered 2020-07-19: 08:00:00 25 g via INTRAVENOUS

## 2020-07-19 MED ORDER — OCTREOTIDE IV DRIP
50 ug/h | INTRAVENOUS | 0 refills | Status: DC
Start: 2020-07-19 — End: 2020-07-19
  Administered 2020-07-19: 15:00:00 50 ug/h via INTRAVENOUS

## 2020-07-19 MED ORDER — CALCIUM CARBONATE 200 MG CALCIUM (500 MG) PO CHEW
500 mg | Freq: Once | ORAL | 0 refills | Status: CP
Start: 2020-07-19 — End: ?
  Administered 2020-07-19: 19:00:00 500 mg via ORAL

## 2020-07-19 MED ORDER — ACETAMINOPHEN 500 MG PO TAB
500 mg | ORAL | 0 refills | Status: DC | PRN
Start: 2020-07-19 — End: 2020-07-20

## 2020-07-19 MED ADMIN — SODIUM CHLORIDE 0.9 % IV SOLP [27838]: 250 mL | INTRAVENOUS | @ 15:00:00 | Stop: 2020-07-19 | NDC 00338004902

## 2020-07-19 NOTE — ED Notes
To room per cart . Albumin infusing on the way to the floor

## 2020-07-19 NOTE — ED Notes
Blood consent sent to [facility] per tube system

## 2020-07-19 NOTE — Consults
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 GAVE noted s/p APC. 2cm gastric antral polyp with ulceration on the top of the polyp. Not removed due to ongoing GAVE.  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 Cipro ppx.  HCC Screening: July 2021 AFP 4.0. 05/2020 [place] no mass.    AKI on CKD  - baseline cr <1.5  - presents with cr of 1.79  - gave albumin 25% 50 gm x 1    Recommendations:  -This has been a longstanding issue for Mrs. Machia.  At this time we will plan to monitor her response after adequate transfusion.  ?Would recommend evaluating her iron stores as she will likely benefit from IV iron  ?We will continue to evaluate need for EGD  ?Large-volume paracentesis  - Can stop octreotide    Patient seen/discussed with Dr. 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  Patient states that she was having routine labs which showed low hemoglobin and she was prompted to go to the ER.  She states that she has had dark/black stools chronically, these sometimes worsen after her APC procedures for gave.  She has not noticed a significant worsening of her dark stools with tiredness to them.  Denies any red blood per rectum or hematemesis.  No worsening, new, or acute abdominal pain.  No fevers or chills.  No diarrhea.  No alcohol, tobacco, illicits.  She lives in [place], [place] with her husband.    PMH:  Medical History:   Diagnosis Date   ? Aneurysm (HCC)    ? 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 Diverticulosis of colon     descending and sigmoid colon   ? Dyspnea    ? Essential hypertension 08/13/2012   ? Fatty infiltration of liver    ? HTN (hypertension)    ? Obesity (BMI 30-39.9) 05/13/2011   ? Preop cardiovascular exam 01/07/2019   ? Sinus infection    ? Type 2 diabetes mellitus (HCC) 09/21/2014       Current medications:  No current facility-administered medications on file prior to encounter.     Current Outpatient Medications on File Prior to Encounter   Medication Sig Dispense Refill   ?  abaloparatide (TYMLOS) 80 mcg (3,120 mcg/1.56 mL) injection pen Inject eighty mcg under the skin daily. Administer into the periumbilical region of the abdomen while sitting or lying down in case of orthostatic hypotension. 3.12 mL 3   ? ciprofloxacin (CIPRO) 500 mg tablet Take one tablet by mouth daily. 30 tablet 1   ? duloxetine DR (CYMBALTA) 60 mg capsule Take 60 mg by mouth daily.     ? furosemide (LASIX) 40 mg tablet Take one-half tablet by mouth every morning. 90 tablet 0   ? lidocaine (ASPERCREME PATCH) 4 % topical patch Apply one each topically to affected area daily. 5 each 1   ? midodrine (PROAMITINE) 10 mg tablet TAKE 1 TABLET BY MOUTH THREE TIMES A DAY 90 tablet 1   ? oxyCODONE (ROXICODONE) 5 mg tablet Take one tablet by mouth every 6 hours as needed 20 tablet 0   ? pantoprazole DR (PROTONIX) 40 mg tablet TAKE 1 TABLET BY MOUTH TWICE A DAY 180 tablet 1   ? polyethylene glycol 3350 (MIRALAX) 17 g packet Take one packet by mouth twice daily.     ? promethazine (PHENERGAN) 25 mg tablet Take 25 mg by mouth every 4 hours as needed. Just uses it with Tramadol     ? rifAXIMin (XIFAXAN) 550 mg tablet Take one tablet by mouth twice daily. 180 tablet 3   ? zinc sulfate 220 mg (50 mg elemental zinc) capsule Take one capsule by mouth daily. 90 capsule 3       PSH:  Surgical History:   Procedure Laterality Date   ? HYSTERECTOMY  1994   ? UPPER GASTROINTESTINAL ENDOSCOPY  2009   ? COLONOSCOPY  2009   ? CYSTOCELE REPAIR  2009    with endocele repair   ? RECTOCELE REPAIR  03/2009   ? LIVER BIOPSY  07/17/2010   ? COLONOSCOPY N/A 11/08/2017    Performed by Dawna Part, MD at [facility] ENDO   ? ESOPHAGOGASTRODUODENOSCOPY N/A 11/08/2017    Performed by Dawna Part, MD at [facility] ENDO   ? ESOPHAGOGASTRODUODENOSCOPY WITH BIOPSY - FLEXIBLE  11/08/2017    Performed by Dawna Part, MD at [facility] ENDO   ? COLONOSCOPY WITH HOT BIOPSY FORCEPS REMOVAL TUMOR/ POLYP/ OTHER LESION  11/08/2017    Performed by Dawna Part, MD at [facility] ENDO   ? ESOPHAGOGASTRODUODENOSCOPY WITH BAND LIGATION ESOPHAGEAL/ GASTRIC VARICES - FLEXIBLE N/A 04/10/2018    Performed by Normajean Baxter, MD at [facility] ENDO   ? ESOPHAGOGASTRODUODENOSCOPY WITH BIOPSY - FLEXIBLE N/A 04/10/2018    Performed by Normajean Baxter, MD at [facility] ENDO   ? ESOPHAGOGASTRODUODENOSCOPY WITH CONTROL OF BLEEDING - FLEXIBLE N/A 04/25/2018    Performed by Jolee Ewing, MD at [facility] ENDO   ? ESOPHAGOGASTRODUODENOSCOPY WITH BIOPSY - FLEXIBLE with push enteroscopy N/A 08/28/2018    Performed by Celesta Gentile, MD at [facility] ENDO   ? EGD N/A 10/27/2018    Performed by Eliott Nine, MD at [facility] ENDO   ? ESOPHAGOGASTRODUODENOSCOPY WITH SNARE REMOVAL TUMOR/ POLYP/ OTHER LESION - FLEXIBLE N/A 10/27/2018    Performed by Eliott Nine, MD at [facility] ENDO   ? ESOPHAGOGASTRODUODENOSCOPY WITH BIOPSY - FLEXIBLE N/A 12/16/2018    Performed by Buckles, Vinnie Level, MD at [facility] ENDO   ? ESOPHAGOGASTRODUODENOSCOPY WITH CONTROL OF BLEEDING - FLEXIBLE N/A 12/16/2018    Performed by Buckles, Vinnie Level, MD at [facility] ENDO   ? ESOPHAGOGASTRODUODENOSCOPY WITH SPECIMEN COLLECTION BY BRUSHING/ WASHING N/A 01/22/2019    Performed by  Veneta Penton, MD at [facility] ENDO   ? ESOPHAGOGASTRODUODENOSCOPY WITH DILATION ESOPHAGUS WITH BALLOON 30 MM OR GREATER - FLEXIBLE N/A 01/22/2019    Performed by Veneta Penton, MD at [facility] ENDO   ? ESOPHAGOGASTRODUODENOSCOPY WITH BIOPSY - FLEXIBLE N/A 01/22/2019    Performed by Veneta Penton, MD at [facility] ENDO   ? ESOPHAGOGASTRODUODENOSCOPY WITH BIOPSY - FLEXIBLE N/A 06/19/2019    Performed by Dawna Part, MD at [facility] ENDO   ? ESOPHAGOGASTRODUODENOSCOPY [WITH APC]  WITH SPECIMEN COLLECTION BY BRUSHING/ WASHING N/A 08/07/2019    Performed by Dawna Part, MD at [facility] ENDO   ? ESOPHAGOGASTRODUODENOSCOPY WITH SPECIMEN COLLECTION BY BRUSHING/ WASHING N/A 09/10/2019    Performed by Buckles, Vinnie Level, MD at [facility] ENDO   ? ESOPHAGOGASTRODUODENOSCOPY WITH CONTROL OF BLEEDING - FLEXIBLE N/A 09/10/2019    Performed by Buckles, Vinnie Level, MD at [facility] ENDO   ? ESOPHAGOGASTRODUODENOSCOPY WITH CONTROL OF BLEEDING - FLEXIBLE N/A 02/12/2020    Performed by Lenor Derrick, MD at [facility] ENDO   ? ESOPHAGOGASTRODUODENOSCOPY WITH BIOPSY - FLEXIBLE N/A 02/26/2020    Performed by Buckles, Vinnie Level, MD at [facility] ENDO   ? ESOPHAGOGASTRODUODENOSCOPY WITH BIOPSY - FLEXIBLE N/A 05/18/2020    Performed by Lenor Derrick, MD at [facility] ENDO   ? ESOPHAGOGASTRODUODENOSCOPY WITH CONTROL OF BLEEDING - FLEXIBLE N/A 05/18/2020    Performed by Lenor Derrick, MD at [facility] ENDO   ? ESOPHAGOGASTRODUODENOSCOPY WITH CONTROL OF BLEEDING - FLEXIBLE N/A 07/08/2020    Performed by Vertell Novak, MD at [facility] ENDO   ? CHOLECYSTECTOMY     ? COLONOSCOPY     ? ESOPHAGOGASTRIC FUNDOPLICATION  2003, 2004    laparoscopic   ? ESOPHAGOGASTRIC FUNDOPLICATION     ? HX HYSTERECTOMY     ? PR ESOPHAGOSCOPY FLEXIBLE TRANSORAL DIAGNOSTIC         SH:  Social History     Socioeconomic History   ? Marital status: Married     Spouse name: [name]   ? Number of children: 1   ? Years of education: Not on file   ? Highest education level: Not on file   Occupational History   ? Occupation: Geographical information systems officer: EMPLOYED   Tobacco Use   ? Smoking status: Never Smoker   ? Smokeless tobacco: Never Used   Vaping Use   ? Vaping Use: Never used   Substance and Sexual Activity   ? Alcohol use: No   ? Drug use: No   ? Sexual activity: Yes     Partners: Male     Birth control/protection: Other   Other Topics Concern   ? Not on file   Social History Narrative    Negative  For tobacco, alcohol, or illicit drug use.  No high risk sexual behavior or tattoo placement and no transfusion prior to 1992.  Married, one child healthy.  Works in Radio broadcast assistant.    Works in [place]        FH:  Family History   Problem Relation Age of Onset   ? Other Mother    ? Kidney Failure Mother    ? Osteoporosis Mother    ? Cancer Father         esophageal   ? Liver Disease Sister         endstage, s/p liver transplant   ? Cancer Brother         tonsil, liver    ? Hip Fracture  Neg Hx        Review of Systems:  Constitutional: No fevers, chills, weight loss  Eyes: No vision deficit, no icterus  Ears, nose, mouth: No oral bleeding, ulcer  Cardiovascular: No chest pain, palpitations  Respiratory: No dyspnea, cough   Gastrointestinal: see HPI  Musculoskeltal: No joint inflammation, new deformity  Integumentary: No rashes, exudate  Neurologic: No cognitive change, focal weakness  Hematologic: No for easy bleeding or bruising  Please see HPI for additional pertinent documentation    Physical Exam:  Vitals:    07/19/20 0402 07/19/20 0444 07/19/20 0616 07/19/20 0732   BP: 97/45 98/57 94/46  (!) 87/47   BP Source: Arm, Right Upper      Pulse: 104 97 97 95   Temp: 36.7 ?C (98 ?F) 37.1 ?C (98.7 ?F) 36.7 ?C (98 ?F) 37 ?C (98.6 ?F)   SpO2: 98% 97% 97% 96%   Weight:       Height:         Constitutional- Vitals above, no acute distress.   Head - Normocephalic, atraumatic.   Eyes -  EOMI grossly. No icterus or injection.   Ears, nose, mouth, throat- No oral ulcer or bleeding.   Neck - No swelling or tracheal deviation.   Respiratory - Symmetric chest rise, no increased work of breathing.  Cardiovascular - Peripheral pulses intact, no pedal edema.  Gastrointestinal- Non-TTP. Soft, bowel sounds positive, no rebound or guarding  DRE with brown stool  Skin - No exposed rash, lesion.  Neurologic - No CN deficit, normal fluid speech  Psychiatric - Judgement intact, thought content appropriate.    Labs/Imaging:  Pertient labs/imaging were reviewed on initiation of progress note.

## 2020-07-19 NOTE — Case Management (ED)
Case Management Admission Assessment    NAME:Kristin Moody                          MRN: [id number]             DOB:03/27/1956          AGE: 64 y.o.  ADMISSION DATE: 07/18/2020             DAYS ADMITTED: LOS: 1 day      Today?s Date: 07/19/2020    Source of Information: Patient, EMR    SWCM met with pt and husband at bedside and introduced self and CM roles. Pt provided opportunity for questions and discussion. SWCM confirmed information from the EMR has not changed and pt engaged in assessment. Per huddle conversations, nursing identified pt has had falls and marks on her skin related to her falls. When [facility] discussed with pt (husband, Kristin Moody present in room), she reports she has lost her balance recently. SWCM sought to clarify timeline of falls and pt denied any falls in the past 6 months. Pt stated her last fall was approximately 6 months ago when she fell to the ground and broke her wrist. Husband agrees with timeline. Per MPP physician, pt likely to bruise easily due to her liver disease. SWCM reached out to [facility] SWCM to see if she has any other information regarding pt's falls and safety. SWCM will continue to follow to promote safe dc.    Per EMR:    Kristin Moody is a 64 y.o. female with a history of cirrhosis, hypertension, diabetes mellitus type 2, obesity, aortic aneurysm who presented to the hospital with melena and acute on chronic anemia     Plan  Plan: Case Management Assessment, Assist PRN with SW/NCM Services    Patient Address/Phone  [address]  [place] [address]  [phone] (home)     Emergency Contact  Extended Emergency Contact Information  Primary Emergency Contact: Shedden,Gary  Address: [address] [address] [place]  Home Phone: [phone]  Mobile Phone: [phone]  Relation: Spouse  Interpreter needed? No  Secondary Emergency Contact: Folsom,Jeremy   [employer] Phone: [phone]  Relation: Son    Forensic scientist: Yes, patient has a Editor, commissioning  Type of Healthcare Directive: Durable power of attorney for healthcare, Healthcare directive  Location of Healthcare Directive: Current and verified in document scanning system  Would patient like to fill out a (a new) Editor, commissioning?: No, patient declined  Lawyer (Psych unit only): No, patient does not have a Social research officer, government  Does the Patient Need Case Management to Arrange Discharge Transport? (ex: facility, ambulance, wheelchair/stretcher, Medicaid, cab, other): No  Will the Patient Use Family Transport?: Yes  Does the patient need discharge transport arranged?: No  Transportation Name, Phone and Availability #1: Patient's husband Kristin Moody [phone] will provide transportation upon discharge.    Expected Discharge Date       Living Situation Prior to Admission  ? Living Arrangements  Type of Residence: Home, independent  Living Arrangements: Spouse/significant other  How many levels in the residence?: 1  Does residence have entry and/or side stairs?: No (ramp to enter)  Assistance needed prior to admit or anticipated on discharge: No  Who provides assistance or could if needed?: Husband Kristin Moody  Can support system  provide 24/7 care if needed?: Maybe  ? Level of Function   Prior level of function: Independent  ? Cognitive Abilities   Cognitive Abilities: Alert and Oriented, Engages in problem solving and planning, Participates in decision making    Financial Resources  ? Coverage  Primary Insurance: Medicare  Additional Coverage: Other (comment) (medications are affordable)    ? Source of Income   Source Of Income: SSDI  ? Financial Assistance Needed?  no    Psychosocial Needs  ? Mental Health  Mental Health History: Yes  Mental Health Provider: PCP manages medication  Mental Health Symptoms: Feeling depressed  ? Substance Use History  Substance Use History Screen: In the past  Comment: hx of smoking, quit aproximately 30 years ago  ? Other  none    Current/Previous Services  ? PCP  Concha Norway, [phone], [phone]  ? Pharmacy    [facility] [phone] [place] [place] - [address]  [address]  [place] [address]  Phone: [phone] Fax: [phone]    ? Durable Medical Equipment   Durable Medical Equipment at home: [employer], Grab bars, Wheelchair (manual), Crutches  ? Home Health  Receiving home health: In the past  Agency name: [facility]  Would patient use this agency again?: Yes  ? Hemodialysis or Peritoneal Dialysis  Undergoing hemodialysis or peritoneal dialysis: No  ? Tube/Enteral Feeds  Receive tube/enteral feeds: No  ? Infusion  Receive infusions: In the past  Where: [facility]  Frequency: PRN  Medication Received: blood or iron infusions  ? Private Duty  Private duty help used: No  ? Home and Community Based [employer] and community based services: No  ? [facility]: N/A  ? Hospice  Hospice: No  ? Outpatient Therapy     ? Skilled Nursing Facility/Nursing Home  SNF: No  NH: No  ? Inpatient Rehab  IPR: No  ? Long-Term Acute Care Hospital  LTACH: No  ? Acute Hospital Stay  Acute Hospital Stay: In the past  Was patient's stay within the last 30 days?: No      Drue Dun, LMSW  Inpatient Social Work Case Manager  Pager: [phone]  Phone:[phone]

## 2020-07-19 NOTE — Progress Notes
Medicine Progress Note    Name:  Kristin Moody   ZOXWR'U Date:  07/19/2020  Admission Date: 07/18/2020  LOS: 1 day                     Assessment/Plan:    Principal Problem:    Anemia      Kristin Moody is a 64 y.o. 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 Gastric antral vascular ectasia with bleeding. Treated with argon plasma coagulation (APC).  - transfuse to keep Hg >7  - CBC every 8 hours  - continue protonix, octreotide and Rocephin  - hepatology consulted  ?  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   -?next paracentesis planned for thursday  - continue PTA rifaximin andn miralax  - continue PTA Midodirne 10 mg TID.   Holding Lasix with concern for bleed  - hepatology consulted  ?  AKI on CKD stage 3  - baseline cr <1.5  - presents with cr of 1.79  -Given albumin 25% 50 gm x 1  - avoid nephrotoxin      Prophylaxis  - VTE: Holding with bleed    FEN  - IVF: None  - Diet: Was started on a regular diet at admitted, she appears hemodynamically stable.  We will continue for now pending hepatology recommendations  - Replace electrolytes PRN    Code status:  Full Code     Disposition: Continue inpatient care    Marthe Patch, MD  Internal Medicine Hospitalist   Personal Pager [phone]  07/19/2020    ________________________________________________________________________    Subjective  Kristin Moody is a 64 y.o. female.  Patient reports feeling fatigued.  Otherwise feeling okay and mostly at her baseline.  She has been having blood in her stools melanotic in nature as an outpatient over the last few weeks and it is not something new presented to the hospital yesterday after she was noted to have low hemoglobin on outpatient labs.  Her husband is at the bedside.  The swelling in her lower legs is baseline for her    Review of Systems: 5 point ROS obtained with pertinent positive and negative findings reflected above.       Medications  Scheduled Meds:cefTRIAXone (ROCEPHIN) IVP 1 g, 1 g, Intravenous, Q24H*  duloxetine DR (CYMBALTA) capsule 60 mg, 60 mg, Oral, QDAY  furosemide (LASIX) tablet 20 mg, 20 mg, Oral, QAM8  midodrine (PROAMATINE) tablet 10 mg, 10 mg, Oral, TID  pantoprazole (PROTONIX) injection 40 mg, 40 mg, Intravenous, BID(11-21)  rifAXIMin (XIFAXAN) tablet 550 mg, 550 mg, Oral, BID    Continuous Infusions:  PRN and Respiratory Meds:acetaminophen Q6H PRN, bisacodyL QDAY PRN, melatonin QHS PRN, ondansetron Q6H PRN **OR** ondansetron (ZOFRAN) IV Q6H PRN, polyethylene glycol 3350 QDAY PRN      Objective                       Vital Signs: Last Filed                 Vital Signs: 24 Hour Range   BP: 94/46 (08/31 0616)  Temp: 36.7 ?C (98 ?F) (08/31 1191)  Pulse: 97 (08/31 0616)  Respirations: 18 PER MINUTE (08/31 0616)  SpO2: 97 % (08/31 0616)  SpO2 Pulse: 98 (08/30 1852)  Height: 160 cm (63) (08/30 1431) BP: (84-111)/(33-57)   Temp:  [36.6 ?C (97.8 ?F)-37.1 ?C (98.7 ?F)]   Pulse:  [95-104]   Respirations:  [18 PER MINUTE-23 PER MINUTE]   SpO2:  [95 %-100 %]    Intensity Pain Scale (Self Report): 6 (07/19/20 0409) Vitals:    07/18/20 1431   Weight: 63.5 kg (140 lb)       Intake/Output Summary:  (Last 24 hours)    Intake/Output Summary (Last 24 hours) at 07/19/2020 0658  Last data filed at 07/19/2020 1610  Gross per 24 hour   Intake 0 ml   Output 300 ml   Net -300 ml      Stool Occurrence: 1    Physical Exam   CONSTITUTIONAL: Vitals signs reviewed. Well developed, well nourished, resting comfortably and in no distress.    HEAD: NCAT  PULM/RESPIRATORY: Lung fields are clear to auscultation bilaterally. There are no wheezes or rales. Normal respiratory effort, not in distress or using accessory muscles to breathe.  CV: There is a regular rhythm. There are no murmurs, gallops or rubs.  GI/ABDOMEN: The abdomen is soft and non-tender. Normal bowel sounds.  Mild ascites noted  GU: No Foley catheter in place  MUSCULOSKELETAL: Extremities are intact and without trauma.   NEURO: Cranial nerves are grossly intact. There are no focal motor defects. Moves all extremities.   PSYCH: The patient has a normal mood and affect, with a normal judgment and insight. Recent and remote memory intact. Alert and oriented to person, place, and time    Lab Review  Pertinent labs results from 07/19/2020 reviewed    Point of Care Testing  (Last 24 hours)  Pertinent POC testing from 07/19/2020 reviewed    Radiology and other Diagnostics Review:    Reviewed     Marthe Patch, MD   Pager     Malnutrition Details:                                        Active Wounds        Wounds 07/18/20 1930 Skin tear Right;Posterior;Lower Arm (Active)   07/18/20 1930 Arm   Wound Type: Skin tear   Pressure Injury Stages:    Pressure Injury Present On Inpatient Admission:    Wound/Pressure Injury Orientation: Right;Posterior;Lower   Wound Location Comments:    Wound Description (Comments):    Wound Type::    Wound Dressing Status Intact 07/18/20 2200   Wound Dressing and / or Treatment Gauze 07/18/20 2200   Wound Securement / Protective Device Coban 07/18/20 2200   Wound Drainage Description Creamy 07/18/20 2200   Wound Drainage Amount Small 07/18/20 2200   Wound Base Assessment Clean;Moist;Pink;Red 07/18/20 2200   Surrounding Skin Assessment Intact;Pink;Red 07/18/20 2200   Wound Site Closure Wound Adhesive Bandage 07/18/20 2200   Number of days: 1

## 2020-07-20 ENCOUNTER — Encounter: Admit: 2020-07-20 | Discharge: 2020-07-20 | Payer: MEDICARE

## 2020-07-20 ENCOUNTER — Inpatient Hospital Stay: Admit: 2020-07-20 | Discharge: 2020-07-20 | Payer: MEDICARE

## 2020-07-20 DIAGNOSIS — Z01810 Encounter for preprocedural cardiovascular examination: Secondary | ICD-10-CM

## 2020-07-20 DIAGNOSIS — K7581 Nonalcoholic steatohepatitis (NASH): Secondary | ICD-10-CM

## 2020-07-20 DIAGNOSIS — I729 Aneurysm of unspecified site: Secondary | ICD-10-CM

## 2020-07-20 DIAGNOSIS — K7460 Unspecified cirrhosis of liver: Secondary | ICD-10-CM

## 2020-07-20 DIAGNOSIS — E119 Type 2 diabetes mellitus without complications: Secondary | ICD-10-CM

## 2020-07-20 DIAGNOSIS — M6250 Muscle wasting and atrophy, not elsewhere classified, unspecified site: Secondary | ICD-10-CM

## 2020-07-20 DIAGNOSIS — N183 Chronic kidney disease, stage 3 unspecified (HCC): Secondary | ICD-10-CM

## 2020-07-20 DIAGNOSIS — K766 Portal hypertension: Secondary | ICD-10-CM

## 2020-07-20 DIAGNOSIS — Z882 Allergy status to sulfonamides status: Secondary | ICD-10-CM

## 2020-07-20 DIAGNOSIS — Z7682 Awaiting organ transplant status: Secondary | ICD-10-CM

## 2020-07-20 DIAGNOSIS — Z20822 Contact with and (suspected) exposure to covid-19: Secondary | ICD-10-CM

## 2020-07-20 DIAGNOSIS — E1122 Type 2 diabetes mellitus with diabetic chronic kidney disease: Secondary | ICD-10-CM

## 2020-07-20 DIAGNOSIS — Z9049 Acquired absence of other specified parts of digestive tract: Secondary | ICD-10-CM

## 2020-07-20 DIAGNOSIS — I129 Hypertensive chronic kidney disease with stage 1 through stage 4 chronic kidney disease, or unspecified chronic kidney disease: Secondary | ICD-10-CM

## 2020-07-20 DIAGNOSIS — K760 Fatty (change of) liver, not elsewhere classified: Secondary | ICD-10-CM

## 2020-07-20 DIAGNOSIS — Z888 Allergy status to other drugs, medicaments and biological substances status: Secondary | ICD-10-CM

## 2020-07-20 DIAGNOSIS — R188 Other ascites: Secondary | ICD-10-CM

## 2020-07-20 DIAGNOSIS — R0600 Dyspnea, unspecified: Secondary | ICD-10-CM

## 2020-07-20 DIAGNOSIS — Z90710 Acquired absence of both cervix and uterus: Secondary | ICD-10-CM

## 2020-07-20 DIAGNOSIS — Z880 Allergy status to penicillin: Secondary | ICD-10-CM

## 2020-07-20 DIAGNOSIS — N179 Acute kidney failure, unspecified: Secondary | ICD-10-CM

## 2020-07-20 DIAGNOSIS — K5730 Diverticulosis of large intestine without perforation or abscess without bleeding: Secondary | ICD-10-CM

## 2020-07-20 DIAGNOSIS — K7290 Hepatic failure, unspecified without coma: Secondary | ICD-10-CM

## 2020-07-20 DIAGNOSIS — K31811 Angiodysplasia of stomach and duodenum with bleeding: Secondary | ICD-10-CM

## 2020-07-20 DIAGNOSIS — E669 Obesity, unspecified: Secondary | ICD-10-CM

## 2020-07-20 DIAGNOSIS — Z885 Allergy status to narcotic agent status: Secondary | ICD-10-CM

## 2020-07-20 DIAGNOSIS — J329 Chronic sinusitis, unspecified: Secondary | ICD-10-CM

## 2020-07-20 DIAGNOSIS — I10 Essential (primary) hypertension: Secondary | ICD-10-CM

## 2020-07-20 DIAGNOSIS — Z79899 Other long term (current) drug therapy: Secondary | ICD-10-CM

## 2020-07-20 LAB — COMPREHENSIVE METABOLIC PANEL
Lab: 132 MMOL/L — ABNORMAL LOW (ref 137–147)
Lab: 44 mL/min — ABNORMAL LOW (ref >60)
Lab: 45 mg/dL — ABNORMAL HIGH (ref 7–25)
Lab: 5 MMOL/L (ref 3.5–5.1)
Lab: 5.3 g/dL — ABNORMAL LOW (ref 6.0–8.0)

## 2020-07-20 LAB — CBC: Lab: 3 M/UL — ABNORMAL LOW (ref 4.0–5.0)

## 2020-07-20 LAB — MAGNESIUM: Lab: 2.4 mg/dL — ABNORMAL HIGH (ref 1.6–2.6)

## 2020-07-20 MED ORDER — DIPHENHYDRAMINE HCL 50 MG/ML IJ SOLN
25 mg | INTRAVENOUS | 0 refills | Status: DC | PRN
Start: 2020-07-20 — End: 2020-07-20

## 2020-07-20 MED ORDER — IRON SUCROSE 300 MG IRON/15 ML IV SOLN
300 mg | Freq: Once | INTRAVENOUS | 0 refills | Status: CP
Start: 2020-07-20 — End: ?
  Administered 2020-07-20 (×2): 300 mg via INTRAVENOUS

## 2020-07-20 MED ORDER — ALBUMIN, HUMAN 25 % IV SOLP
0 refills | Status: CP
Start: 2020-07-20 — End: ?
  Administered 2020-07-20: 17:00:00 37.5 g via INTRAVENOUS

## 2020-07-20 MED ORDER — EPINEPHRINE HCL (PF) 1 MG/ML (1 ML) IJ SOLN
.3 mg | INTRAMUSCULAR | 0 refills | Status: DC | PRN
Start: 2020-07-20 — End: 2020-07-20

## 2020-07-20 NOTE — Patient Instructions
IR-PARACENTESIS   A paracentesis is the removal of an abnormal buildup of fluid in your abdominal cavity. This fluid buildup is called ascites and may be caused by conditions such as liver disease, heart failure, or cancer. During this procedure, a needle is inserted into your abdomen to drain the fluid. The fluid may then be sent to the lab for testing if medically indicated. Removal of the fluid may also relieve belly pressure and shortness of breath caused by the ascites.?  POST-PROCEDURE PAIN:   ? Pain control following your procedure is a priority for both you and your Physicians.  ? Some soreness or tenderness at the site is to be expected for several days. We recommend taking over the counter analgesics to help relieve this pain.  ? Alternative methods for pain relief include but not limited to heat or cold compress, relaxation techniques, rest, and changing of positions.  ? If pain continues after 5-7 days or you have severe pain not relieved by medication, please contact us as directed below.?  POST-PROCEDURE ACTIVITY:   ? A responsible adult must drive you home.  ? If you receive sedation, narcotic pain medication or anesthesia for the procedure, you should not drive or operate heavy machinery or do anything that requires concentration for at least 24 hours after procedure completion.  ? It is recommended that a responsible adult be with you until morning.?  POST-PROCEDURE SITE CARE:   ? You will have a small bandage over the procedure site. Keep this dry.  ? You may remove it in 24 hours.  ? You may shower in 24 hours, after removing the bandage.  ? A dry gauze bandage may be reapplied as necessary to protect your clothing as the site may sometimes leak for several days after the procedure.  ? Do not submerge the procedure site for 1 week (no bathtub, swimming, hot tub, etc.)  ? Do not use ointments, creams or powders on the puncture site.  ? Be sure your hands are clean when touching near the site.?  DIET/MEDICATIONS:   ? You may resume your previous diet after the procedure.  ? If you receive sedation or narcotic pain medications, avoid any foods or beverages containing alcohol for at least 24 hours after the procedure.  ? Please see the Medication Reconciliation sheet for instructions regarding resuming your home medications.?  CALL THE DOCTOR IF:   ? Bright red blood soaks the bandage.  ? You have pain not relieved by medication. Some soreness at the site is to be expected.  ? You have signs of infection such as: fever greater than 101F, chills, redness, warmth, swelling, drainage or pus from the puncture site.  For any of the above symptoms or for problems or concerns related to the procedure performed at the [facility], call [phone] Monday-Friday from 7-5p. After-hours and weekends, please call [phone] and ask for the Interventional Radiology Resident on-call.   You or your caregiver should call 911 for any severe symptoms such as excessive bleeding, severe dizziness, trouble breathing or loss of consciousness.   ?  ?

## 2020-07-20 NOTE — Care Coordination-Inpatient
/  INPATIENT HEPATOLOGY CARE COORDINATION    Patient: BERNARDINA CACHO  MRN: [id number]  07/20/2020    Admit Date:07/18/2020  Discharge Dates: 07/20/2020  Discharge Disposition: Home    Rounding Physician: Dr. 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     Hepatology Follow-ups:      Appointment Date/Time: 08/02/20 @ 3:00   Appointment Provider: Coralee North   Clinic Coordinator Follow up: Labs      Labs: MELD labs due Tuesday 07/26/20 - patient will go to [facility] [place] location). Orders faxed to [phone].     Endoscopy Appointment : PTA EGD scheduled 08/05/2020.   Paracentesis/Thoracentesis Appointment : PTA Standing paracentesis at [place].    Introduced myself and the role of inpatient Financial planner. Encouraged patient/family to call clinic with any hepatology needs after discharge. [phone].    ~Will be available if patient needs anything from a hepatology standpoint while in the hospital Monday through Friday 7am-5pm    ~Discharge information to be sent to the hepatology provider & team that will follow with patient in the outpatient clinic.    Nat Christen, RN-BSN  Inpatient Hepatology Nurse Coordinator  [phone], also available on [facility]  [phone]

## 2020-07-20 NOTE — Discharge Instructions - Pharmacy
Discharge Summary      Name: Kristin Moody  Medical Record Number: [id number]        Account Number:  [id number]  Date Of Birth:  1956-03-04                         Age:  64 years   Admit date:  07/18/2020                     Discharge date:  07/20/2020      Discharge Attending: Ovidio Hanger, MD  Discharge Summary Completed By: Marthe Patch, MD    Service: Med Private [phone]    Reason for hospitalization:  Anemia [D64.9]    Primary Discharge Diagnosis:   Anemia      Hospital Diagnoses:  Hospital Problems        Active Problems    * (Principal) Anemia        Significant Past Medical History        Aneurysm (HCC)  Cirrhosis of liver (HCC)      Comment:  decompensated liver failure  Diverticulosis of colon      Comment:  descending and sigmoid colon  Dyspnea  Essential hypertension  Fatty infiltration of liver  HTN (hypertension)  Obesity (BMI 30-39.9)  Preop cardiovascular exam  Sinus infection  Type 2 diabetes mellitus (HCC)    Allergies   Tegaderm, Adhesive tape (rosins), Sulfa (sulfonamide antibiotics), Codeine, Morphine, Penicillin g, and Tramadol    Brief Hospital Course   The patient was admitted and the following issues were addressed during this hospitalization: (with pertinent details including admission exam/imaging/labs).      Kristin Moody is a 64 y.o. female with a history of cirrhosis, hypertension, diabetes mellitus type 2, obesity, aortic aneurysm who presented to the hospital with melena and acute on chronic anemia   ?  Acute on chronic blood loss anemia  - presents with outpatient Hg of 5.7, and reports melena, this is been an ongoing issue with dark tarry stools for her.  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 Gastric antral vascular ectasia with bleeding. Treated with argon plasma coagulation (APC).  - transfuse to keep Hg >7  -Hepatology was consulted. They did not feel that we needed to repeat a scope. They will continue to follow her closely as an outpatient. She responded well to 3 units of blood. She also received an iron infusion prior to discharge  ?  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   -She received a large volume paracentesis while admitted  ?  AKI on CKD stage 3  - baseline cr <1.5  - presents with cr of 1.79  -Given albumin 25% 50 gm x 1    ?      Items Needing Follow Up   Pending items or areas that need to be addressed at follow up: None    Pending Labs and Follow Up Radiology    Pending labs and/or radiology review at this time of discharge are listed below: if this area is blank, there are no items for review.   Pending Labs     Order Current Status    CELL COUNT W/DIFF-FLUIDS In process    TRANSFUSE RBC'S Preliminary result            Medications      Medication List      CONTINUE taking these medications    ?  ciprofloxacin 500 mg tablet; Commonly known as: CIPRO; Dose: 500 mg;   Take one tablet by mouth daily.; Quantity: 30 tablet; Refills: 1  ? duloxetine DR 60 mg capsule; Commonly known as: CYMBALTA; Dose: 60 mg;   Refills: 0  ? furosemide 40 mg tablet; Commonly known as: LASIX; Dose: 20 mg; Take   one-half tablet by mouth every morning.; Quantity: 90 tablet; Refills: 0  ? lidocaine 4 % topical patch; Commonly known as: ASPERCREME PATCH; Dose:   1 patch; Apply one each topically to affected area daily.; Quantity: 5   each; Refills: 1  ? midodrine 10 mg tablet; Commonly known as: PROAMITINE; TAKE 1 TABLET BY   MOUTH THREE TIMES A DAY; Quantity: 90 tablet; Refills: 1  ? oxyCODONE 5 mg tablet; Commonly known as: ROXICODONE; Dose: 5 mg; Take   one tablet by mouth every 6 hours as needed; Quantity: 20 tablet; Refills:   0  ? pantoprazole DR 40 mg tablet; Commonly known as: PROTONIX; TAKE 1 TABLET   BY MOUTH TWICE A DAY; Quantity: 180 tablet; Refills: 1  ? polyethylene glycol 3350 17 g packet; Commonly known as: MIRALAX; Dose:   17 g; Take one packet by mouth twice daily.; Refills: 0  ? promethazine 25 mg tablet; Commonly known as: PHENERGAN; Dose: 25 mg;   Refills: 0  ? rifAXIMin 550 mg tablet; Commonly known as: XIFAXAN; Dose: 550 mg;   Doctor's comments: Patient with need for possible financial assistance   through [employer]. Please note OOP costs for review; Take one tablet by mouth   twice daily.; Quantity: 180 tablet; Refills: 3  ? TYMLOS 80 mcg (3,120 mcg/1.56 mL) injection pen; Generic drug:   abaloparatide; Dose: 80 mcg; Inject eighty mcg under the skin daily.   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 zinc sulfate 220 mg (50 mg elemental zinc) capsule; Dose: 220 mg; Take   one capsule by mouth daily.; Quantity: 90 capsule; Refills: 3       Return Appointments and Scheduled Appointments     Scheduled appointments:    Jul 21, 2020 10:00 AM  (Arrive by 9:00 AM)  GENERAL IR ASPIRATION/ DRAIN with IR ROOM 8  Interventional Radiology: Main Campus, [facility] Radiology) [address]  [address], [address]  [place] [address]  [phone]   Jul 27, 2020 10:30 AM  (Arrive by 10:15 AM)  Treatment 07 Hour with Eino Farber RM 231  Oncology: [facility] Treatment) [address]  [address]  [place] [address]   Jul 28, 2020 10:00 AM  (Arrive by 9:00 AM)  GENERAL IR ASPIRATION/ DRAIN with IR ROOM 7  Interventional Radiology: Main Campus, [facility] Radiology) [address]  [address], [address] [place] [address]  [phone]   Aug 02, 2020  3:00 PM  Office visit with Haynes Hoehn, APRN-NP  Transplant: [facility] [place] [facility] [place] [address]  [address], [address]  [place] [address]  [phone]   Aug 04, 2020 10:00 AM  (Arrive by 9:00 AM)  GENERAL IR ASPIRATION/ DRAIN with IR ROOM 7  Interventional Radiology: Main Campus, [facility] Radiology) [address]  [address], [address] [place] [address]  [phone]  Aug 11, 2020 10:00 AM  (Arrive by 9:00 AM)  GENERAL IR ASPIRATION/ DRAIN with IR ROOM 7  Interventional Radiology: Main Campus, [facility] Radiology) [address]  [address], [address]  [place] [address]  [phone]   Aug 18, 2020 10:00 AM  (Arrive by 9:00 AM)  GENERAL IR ASPIRATION/ DRAIN with IR ROOM 7  Interventional Radiology: Main Campus, [facility] Radiology) [address]  [address], [address] [place] [address]  [phone]   Nov 01, 2020 10:00 AM  DOPPLER ABD PELV RETROPER COMP with SONO - IC GENERAL ROOM  Imaging Ultrasound: [place], [facility] Radiology) [address]  [address]  [place] [address]  [phone]          Consults, Procedures, Diagnostics, Micro, Pathology   Consults: Hepatology  Surgical Procedures & Dates: None  Significant Diagnostic Studies, Micro and Procedures: noted in brief hospital course  Significant Pathology: none  Nutrition: Dietitian Documentation                                         Discharge Disposition, Condition   Patient Disposition: Home  Condition at Discharge: Stable    Code Status     Code Status History     Date Active Date Inactive Code Status Order ID          06/04/2020 1917 06/07/2020 1434 Full Code [id number]  Murlean Caller, MD Inpatient    02/25/2020 1552 02/28/2020 1333 Full Code [id number]  Mardella Layman, MD ED    Only showing the last 2 code statuses.          Patient Instructions        Low Sodium Diet    You will need to monitor the amount of sodium in your diet. Do not eat more than 2g (grams) or 2000mg  (milligrams) per day.    If you have questions regarding your diet at home, you may contact a dietitian at [phone].     Report These Signs and Symptoms    Please contact your doctor if you have any of the following symptoms: temperature higher than 100.4 degrees F, uncontrolled pain, persistent nausea and/or vomiting, difficulty breathing, or chest pain     Questions About Your Stay    If you have an emergency after discharge, please dial 9-1-1.    You may contact your discharging physician up to 7 days after discharge for questions about your hospitalization, discharge instructions, or medications by calling [phone] during regular business hours (8AM-4PM) and asking to speak to the doctor listed on the discharge information.       If you are not calling during business hours, ask for the on-call doctor.    If you have new  or worsening symptoms, you may be directed to your primary care provider (PCP) for ongoing questions, an Emergency Department, or an Urgent Care Clinic for a more immediate evaluation.    For all calls or questions more than 7 days after discharge, please contact your primary care provider (PCP).    For medications after discharge: pain (opioid) medicine cannot be refilled or prescribed by calling your discharging physician.  These medications need to be filled by your primary care provider (PCP).  Regular refill requests should be directed to your primary care provider (PCP).  Discharging attending physician: Marthe Patch [[id number]]      Activity as Tolerated    It is important to keep increasing your activity level after you leave the hospital.  Moving around can help prevent blood clots, lung infection (pneumonia) and other problems.  Gradually increasing the number of times you are up moving around will help you return to your normal activity level more quickly.  Continue to increase the number of times you are up to the chair and walking daily to return to your normal activity level. Begin to work toward your normal activity level at discharge       Additional Orders: Case Management, Supplies, Home Health     Home Health/DME     None            Signed:  Marthe Patch, MD  07/20/2020      cc:  Primary Care Physician:  Concha Norway Verified    Referring physicians:  Dawna Part, MD   Additional provider(s):        Did we miss something? If additional records are needed, please fax a request on office letterhead to [phone]. Please include the patient's name, date of birth, fax number and type of information needed. Additional request can be made by email at [email or web address]. For general questions of information about electronic records sharing, call [phone].

## 2020-07-20 NOTE — Consults
Interventional Radiology Consult Note with Pre-procedural History and Physical    Admission Date: 07/18/2020  LOS: 2 days                       Principal Problem:    Anemia      Reason for consult: Ascites. Evaluate for paracentesis.    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  -   Platelet Count   Date Value Ref Range Status   07/20/2020 183 150 - 400 K/UL Final   ;   INR   Date Value Ref Range Status   07/18/2020 1.6 (H) 0.8 - 1.2 Final       Plan:  - Will proceed with paracentesis.  - Does not need to be kept NPO for this procedure, we use a local anesthetic only.    Procedure: US guided paracentesis       IR Pre Procedure Notes:  None.   __________________________________________________________________    Chief Complaint:  Ascites    Previous Anesthetic/Sedation History:  N/A-local anesthetic    Code Status: Full Code    History of present illness:  Kristin Moody is a 64 y.o. female patient with hx as noted above admitted with anemia. IR consulted for image-guided paracentesis.  See ROS below for current symptoms     Review of Systems  Constitutional: negative for fevers and chills  Respiratory: negative for cough or dyspnea  Gastrointestinal: positive for abdominal distention    Medications  Scheduled Meds:cefTRIAXone (ROCEPHIN) IVP 1 g, 1 g, Intravenous, Q24H*  [MAR Hold] duloxetine DR (CYMBALTA) capsule 60 mg, 60 mg, Oral, QDAY  [MAR Hold] midodrine (PROAMATINE) tablet 10 mg, 10 mg, Oral, TID  [MAR Hold] pantoprazole (PROTONIX) injection 40 mg, 40 mg, Intravenous, BID(11-21)  rifAXIMin (XIFAXAN) tablet 550 mg, 550 mg, Oral, BID    Continuous Infusions:  PRN and Respiratory Meds:[MAR Hold] acetaminophen Q6H PRN, [MAR Hold] bisacodyL QDAY PRN, [MAR Hold] melatonin QHS PRN, [MAR Hold] ondansetron Q6H PRN **OR** [MAR Hold] ondansetron (ZOFRAN) IV Q6H PRN, [MAR Hold] polyethylene glycol 3350 QDAY PRN      Objective                       Vital Signs: Last Filed                 Vital Signs: 24 Hour Range   BP: 115/67 (09/01 1049)  Temp: 36.6 ?C (97.9 ?F) (09/01 1049)  Pulse: 95 (09/01 1049)  Respirations: 22 PER MINUTE (09/01 1049)  SpO2: 98 % (09/01 1049)  SpO2 Pulse: 94 (09/01 1049) BP: (105-115)/(42-67)   Temp:  [36.6 ?C (97.9 ?F)-37.4 ?C (99.4 ?F)]   Pulse:  [87-98]   Respirations:  [16 PER MINUTE-22 PER MINUTE]   SpO2:  [95 %-99 %]    Intensity Pain Scale (Self Report): 5 (07/20/20 1049) Vitals:    07/18/20 1431   Weight: 63.5 kg (140 lb)         Intake/Output Summary:  (Last 24 hours)    Intake/Output Summary (Last 24 hours) at 07/20/2020 1057  Last data filed at 07/20/2020 0530  Gross per 24 hour   Intake 287.58 ml   Output 850 ml   Net -562.42  ml      Stool Occurrence: 0    Physical Exam  General appearance: alert and no distress  Neurologic: Grossly normal, at baseline  Lungs: Nonlabored with normal effort  Abdomen: distended, TTP     Anesthesia Classification:  N/A-local anesthetic  Intra-procedural Sedation/Medication Plan: Lidocaine  Discussion/Reviews:  Physician has discussed risks and alternatives of this type of sedation and above planned procedures with patient  NPO Status: N/A-local anesthestic  Pregnancy Status: N/A-local anesthetic and U/S guided    Lab/Radiology/Other Diagnostic Tests:  Labs:  Pertinent labs reviewed  Radiology: Reviewed.         We appreciate being able to participate in this patient's care. Please page with any questions or concerns.    Velora Mediate, APRN-NP   Pgr [phone]    IR Team Pager [phone] (After-hours and Weekends)

## 2020-07-20 NOTE — Other
Immediate Post Procedure Note    Date:  07/20/2020                                         Attending Physician:   Dr. Felipa Furnace  Performing Provider:  Ellsworth Lennox, DO    Consent:  Consent obtained from patient.  Time out performed: Consent obtained, correct patient verified, correct procedure verified, correct site verified, patient marked as necessary.  Pre/Post Procedure Diagnosis:  Cirrhotic liver with ascites  Indications:  ascites      Procedure(s):  IR Paracentesis, therapeutic and diagnostic:  See radiology report    Findings:  Successful paracentesis     Estimated Blood Loss:  None/Negligible  Specimen(s) Removed/Disposition:  Yes, sent to pathology  Complications: None  Patient Tolerated Procedure: Well  Post-Procedure Condition:  stable    Ellsworth Lennox, DO

## 2020-07-20 NOTE — Progress Notes
Day of Discharge Progress Note    S:   Pt w/o acute events overnight.     O:   Exam unchanged from day prior.    Vitals:    07/20/20 1145 07/20/20 1200 07/20/20 1215 07/20/20 1230   BP: 118/72 111/65 93/59 107/71   BP Source: Arm, Left Upper      Pulse: 96 95 94 94   Temp:  36.6 ?C (97.9 ?F)     SpO2: 97% 98% 98% 97%   Weight:       Height:         Labs reviewed.    A/P:    Patient's hemoglobin is stable.  Discussed with the hepatology team.  No plan for repeat endoscopy this admission.  She underwent a therapeutic paracentesis.  Feels well after the procedure.  She will continue to follow closely as an outpatient with hepatology.  We will give her an iron infusion prior to discharge    Please see dictated DC summary for full details of plan.    I personally spent greater than 35 minutes in the discharge care of this patient. This time was spent with in coordination of the treatment plan with the assistance of the NCM, SW team and the nursing staff. Direct face-to-face patient care and counseling was provided. Topics discussed include instructions for outpatient management and recommended follow up, review of the medication list and any changes that were made during this hospitalization, and concerning signs and symptoms that should prompt the patient to seek medical care urgently.     Staff name:  Marthe Patch, MD Date:  07/20/2020   Pager:  [phone]    Future Appointments   Date Time Provider Department Center   07/21/2020 10:00 AM IR ROOM 8 IR Interv Radio   07/27/2020 10:30 AM CA11 ONC RM 231 [id number] [place] Treat[facility]   07/28/2020 10:00 AM IR ROOM 7 IR Interv Radio   08/02/2020  3:00 PM Haynes Hoehn, APRN-NP CFT [facility] HEP CFT [place]   08/04/2020 10:00 AM IR ROOM 7 IR Interv Radio   08/11/2020 10:00 AM IR ROOM 7 IR Interv Radio   08/18/2020 10:00 AM IR ROOM 7 IR Interv Radio   11/01/2020 10:00 AM SONO - IC GENERAL ROOM IC1SONO ICC Radiolog

## 2020-07-20 NOTE — Progress Notes
I have reviewed the notes, assessments, and/or procedures performed by [facility], RN, and concur with her/his documentation unless otherwise noted.

## 2020-07-20 NOTE — Progress Notes
RN reviewed AVS with patient and husband. All questions and concerns addressed. Reviewed medication changes and updates and patient was able to verbalize understanding. PIVs removed per protocol. Patient gathered all personal belongings and transported via wheelchair to personal car for transportation home.

## 2020-07-20 NOTE — Case Management (ED)
Case Management Progress Note    NAME:Kristin Moody                          MRN: [id number]              DOB:[birth year]          AGE: 64 y.o.  ADMISSION DATE: 07/18/2020             DAYS ADMITTED: LOS: 2 days      Today?s Date: 07/20/2020    Plan  Pt discussed at MPP huddle today.  Plan is for pt to have iron infusion then DC  Per Attending pt has no CM needs    Interventions  ? Support      ? Info or Referral      ? Discharge Planning      ? Medication Needs  ? Financial      ? Legal      ? Other        Disposition  ? Expected Discharge Date    07/20/2020 3:00 PM  ? Transportation   Does the Patient Need Case Management to Arrange Discharge Transport? (ex: facility, ambulance, wheelchair/stretcher, Medicaid, cab, other): No  Will the Patient Use Family Transport?: Yes  Does the patient need discharge transport arranged?: No  Transportation Name, Phone and Availability #1: Patient's husband [name] [phone] will provide transportation upon discharge.  ? Next Level of Care (Acute Psych discharges only)      ? Discharge Disposition    Selected Continued Care - Admitted Since 07/18/2020    No services have been selected for the patient.       Humphrey Rolls, RN,BSN  Integrated Nurse Case Manager  Phone [phone]  Amie Critchley [phone]

## 2020-07-20 NOTE — Progress Notes
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 Has history of DM II, obesity, hypertension, aortic aneurysm. Admitted with reports of melena and acute on chronic anemia with Hgb 5.7 on outside labs. Hepatology is consulted for recommendations.     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, PHG  - longstanding issue requiring regular outpatient transfusions  - outpatient labs noted Hgb 5.7, unfortunately outpatient transfusion clinic would not transfuse for Hgb < 6.0, necessitating patient admission for pRBCs  - 05/18/20 EGD with small varices  - 07/08/20 EGD:?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, GAVE noted s/p APC, 2cm gastric antral polyp with ulceration on the top of the polyp, not removed due to ongoing GAVE  - DRE on admission with brown stool per fellow note  - Hgb now stable at 7.9 after transfusion 8/31    PSE  - continue PTA Rifaximin, Miralax  - patient has had 3 BMs/24hrs, no evidence of melena or bleeding    Fluid management  - requiring PRN paracentesis weekly  - PTA on Cipro prophylaxis  - paracentesis 9/1 prior to discharge, removed 5L fluid, no SBP     AKI on CKD stage 3  - baseline Cr <1.5  - presents with Cr of 1.79  - received albumin 25% 50 gm x 1  - Cr 9/1 1.45, at baseline    Recommendations  - patient remains HDS, with stable Hgb following transfusion 8/31, will hold on performing EGD as inpatient without evidence of active GI bleeding at this time  - OK to discharge after paracentesis and IV iron infusion  - DC Rocephin and resume PTA Ciprofloxacin prophylaxis  - continue PTA Miralax and Rifaximin   - outpatient paracentesis as previously coordinated    - FU with Coralee North, APRN in the Transplant Clinic on 08/02/20 at 3:00  - patient should continue with previously scheduled weekly labs and outpatient transfusions  - patient may call RN/coordinator at [phone] for questions/concern prior to FU appointment   - DC plan discussed with outpatient transplant coordinator T Zimmer for ongoing care     Attempted to see patient AM 9/1 however she was off the unit for paracentesis. If medically stable from primary standpoint, OK for discharge today. Discussed tentative plan for discharge with patient's husband at the bedside.     Discussed with MP-P, Dr. Vonita Moss.     Luther Redo, APRN-NP   Pager [phone]

## 2020-07-21 ENCOUNTER — Encounter: Admit: 2020-07-21 | Discharge: 2020-07-21 | Payer: MEDICARE

## 2020-07-21 LAB — CELL COUNT W/DIFF-FLUIDS
Lab: 50 /uL (ref 6.0–8.0)
Lab: <10 /uL — ABNORMAL LOW (ref 0.3–1.2)

## 2020-07-21 NOTE — Telephone Encounter
Patient called in regarding her pain would like to speak to you about it

## 2020-07-22 ENCOUNTER — Encounter: Admit: 2020-07-22 | Discharge: 2020-07-22 | Payer: MEDICARE

## 2020-07-22 DIAGNOSIS — Z01818 Encounter for other preprocedural examination: Secondary | ICD-10-CM

## 2020-07-22 DIAGNOSIS — Z01812 Encounter for preprocedural laboratory examination: Secondary | ICD-10-CM

## 2020-07-22 NOTE — Telephone Encounter
spoke to patient. Patient reports she was having pain in the middle of abdomen by belly button yesterday. Pain relieved by heating pad and lidocaine patch. Patient denies any s/s of pain today.   Advised patient she will need f/u labs on Tuesday 9/7 to check hgb levels. Patient will get this done at [facility].  Patient scheduled for outpatient blood transfusion on 9/8 and understands pending lab results, this transfusion may need to be moved to a different date.

## 2020-07-26 ENCOUNTER — Encounter: Admit: 2020-07-26 | Discharge: 2020-07-26 | Payer: MEDICARE

## 2020-07-26 ENCOUNTER — Ambulatory Visit: Admit: 2020-07-26 | Discharge: 2020-07-26 | Payer: MEDICARE

## 2020-07-26 DIAGNOSIS — R188 Other ascites: Secondary | ICD-10-CM

## 2020-07-26 DIAGNOSIS — D649 Anemia, unspecified: Secondary | ICD-10-CM

## 2020-07-26 DIAGNOSIS — R945 Abnormal results of liver function studies: Secondary | ICD-10-CM

## 2020-07-26 DIAGNOSIS — R102 Pelvic and perineal pain: Secondary | ICD-10-CM

## 2020-07-26 DIAGNOSIS — K31819 Angiodysplasia of stomach and duodenum without bleeding: Secondary | ICD-10-CM

## 2020-07-26 DIAGNOSIS — D500 Iron deficiency anemia secondary to blood loss (chronic): Secondary | ICD-10-CM

## 2020-07-26 LAB — COMPREHENSIVE METABOLIC PANEL
Lab: 1.4 mg/dL — ABNORMAL HIGH (ref 0.4–1.00)
Lab: 1.8 mg/dL — ABNORMAL HIGH (ref 0.3–1.2)
Lab: 132 MMOL/L — ABNORMAL LOW (ref 137–147)
Lab: 146 mg/dL — ABNORMAL HIGH (ref 70–100)
Lab: 188 U/L — ABNORMAL HIGH (ref 25–110)
Lab: 22 MMOL/L (ref 21–30)
Lab: 23 U/L (ref 7–56)
Lab: 3.1 g/dL — ABNORMAL LOW (ref 3.5–5.0)
Lab: 37 mg/dL — ABNORMAL HIGH (ref 7–25)
Lab: 38 mL/min — ABNORMAL LOW (ref >60)
Lab: 4.4 MMOL/L (ref 3.5–5.1)
Lab: 46 mL/min — ABNORMAL LOW (ref >60)
Lab: 5.5 g/dL — ABNORMAL LOW (ref 6.0–8.0)
Lab: 57 U/L — ABNORMAL HIGH (ref 7–40)
Lab: 7 (ref 3–12)

## 2020-07-26 LAB — CBC AND DIFF
Lab: 1 % — ABNORMAL LOW (ref 0–2)
Lab: 2.8 M/UL — ABNORMAL LOW (ref 4.0–5.0)
Lab: 24 % — ABNORMAL HIGH (ref 11–15)
Lab: 26 pg (ref 26–34)
Lab: 31 g/dL — ABNORMAL LOW (ref 32.0–36.0)
Lab: 7.7 g/dL — ABNORMAL LOW (ref 12.0–15.0)
Lab: 78 % — ABNORMAL HIGH (ref 41–77)
Lab: 8.4 10*3/uL (ref 4.5–11.0)
Lab: 85 FL (ref 80–100)

## 2020-07-26 LAB — PROTIME INR (PT): Lab: 1.4 % — ABNORMAL HIGH (ref 0.8–1.2)

## 2020-07-26 NOTE — Telephone Encounter
Patient completed labs today in clinic, results pending. Patient proactively scheduled for blood transfusion at [facility] tomorrow.     Patient present in clinic c/o of lower abdominal and pelvic pain. US renal/bladder/abdomen ordered to check for urinary retention and ascites. Patient scheduled today at 2:15 [facility]. Dicussed directions to facility with patient and husband. Both v/u of plan. Discussed care plan with Dr. Ladona Ridgel who is in agreement.

## 2020-07-27 ENCOUNTER — Encounter: Admit: 2020-07-27 | Discharge: 2020-07-27 | Payer: MEDICARE

## 2020-07-27 ENCOUNTER — Ambulatory Visit: Admit: 2020-07-27 | Discharge: 2020-07-27 | Payer: MEDICARE

## 2020-07-27 DIAGNOSIS — K31819 Angiodysplasia of stomach and duodenum without bleeding: Secondary | ICD-10-CM

## 2020-07-27 DIAGNOSIS — R102 Pelvic and perineal pain: Secondary | ICD-10-CM

## 2020-07-27 DIAGNOSIS — K760 Fatty (change of) liver, not elsewhere classified: Secondary | ICD-10-CM

## 2020-07-27 LAB — URINALYSIS DIPSTICK REFLEX TO CULTURE
Lab: 1 (ref 1.003–1.035)
Lab: 6 (ref 5.0–8.0)
Lab: NEGATIVE
Lab: NEGATIVE
Lab: NEGATIVE
Lab: NEGATIVE
Lab: NEGATIVE
Lab: NEGATIVE
Lab: NEGATIVE
Lab: NEGATIVE

## 2020-07-27 LAB — URINALYSIS MICROSCOPIC REFLEX TO CULTURE

## 2020-07-27 NOTE — Telephone Encounter
Discussed lab results with patient, MELD now 20, updated in [facility].    Patient US abdomen shows moderate ascites, patient scheduled for para 07/28/2020. No signs of urinary retention. Per Dr. Lubertha Basque recommendation, lab orders placed for UA micro and culture to r/o UTI. Patient will complete this today while at [facility] for blood transfusion. Patient v/u and agrees to plan.

## 2020-07-27 NOTE — Progress Notes
Called Kristin Moody to discuss submitting her income documents for proof of annual income(2020 tax documents, SS, Disability, current earned income etc ) for the household of 2. To pursue funding, to assist with the $563.00 per month co pay regarding their medication: Tymlos.  Patient expressed understanding and agreed to the following next steps: contact myself with any additional questions. [facility] contact information is provided below.      HYQMV-[phone]  Fax-[phone]  email-  [email or web address].      Gaston Islam, CPhT  Pharmacy Patient Advocate, Specialty Pharmacy  [phone]

## 2020-07-27 NOTE — Patient Instructions
Post Blood Transfusion Instructions    During your transfusion you were monitored by nursing staff for signs or symptoms of a transfusion reaction.    You should continue to observe for signs of a transfusion reaction for at least 24 hours after being discharged.  Look for signs of yellowing of the eyes or skin up to 1 week after discharge.    Please notify your physician immediately or go to the nearest Emergency Department if you experience anything unusual or if you have any of the following signs or symptoms:  (This is not a complete list of signs or symptoms).  Ref:  cdc.gov National Healthcare Safety Network Biovigilance Component Hemovigilance Module Surveillance Protocol.    Generalized: Respiratory Skin   Fever 100.5 or higher Cough Hives   Chills Shortness of Breath Itching   Feeling Faint or Dizzy Pain (describe type & location): Yellowing of the Eyes or skin (1 week)   Loss of consciousness Back Pain Urine:    Chest Pain Blood Urine     Dark Urine       Notify your provider or Emergency Department that you recently had a blood transfusion.

## 2020-07-27 NOTE — Progress Notes
Pt Arrived to [facility] infusion room for PRBC transfusion. VSS, PIV inserted and positive for blood return, and assessment complete; please refer to doc flowsheet for details. Blood transfused without incident. No transfusion hypersensitivity reaction noted. VSS throughout infusions, please refer to doc flowsheet. PIV positive for blood return, flushed with saline, and removed. Urine sample obtained. All questions and concerns addressed. Copy of AVS provided. Pt left unit in stable condition.

## 2020-07-28 ENCOUNTER — Ambulatory Visit: Admit: 2020-07-28 | Discharge: 2020-07-28 | Payer: MEDICARE

## 2020-07-28 ENCOUNTER — Encounter: Admit: 2020-07-28 | Discharge: 2020-07-28 | Payer: MEDICARE

## 2020-07-28 DIAGNOSIS — Z79899 Other long term (current) drug therapy: Secondary | ICD-10-CM

## 2020-07-28 DIAGNOSIS — I8510 Secondary esophageal varices without bleeding: Secondary | ICD-10-CM

## 2020-07-28 DIAGNOSIS — Z880 Allergy status to penicillin: Secondary | ICD-10-CM

## 2020-07-28 DIAGNOSIS — R188 Other ascites: Secondary | ICD-10-CM

## 2020-07-28 DIAGNOSIS — Z6830 Body mass index (BMI) 30.0-30.9, adult: Secondary | ICD-10-CM

## 2020-07-28 DIAGNOSIS — Z885 Allergy status to narcotic agent status: Secondary | ICD-10-CM

## 2020-07-28 DIAGNOSIS — K7460 Unspecified cirrhosis of liver: Secondary | ICD-10-CM

## 2020-07-28 DIAGNOSIS — Z9049 Acquired absence of other specified parts of digestive tract: Secondary | ICD-10-CM

## 2020-07-28 DIAGNOSIS — D649 Anemia, unspecified: Secondary | ICD-10-CM

## 2020-07-28 DIAGNOSIS — J329 Chronic sinusitis, unspecified: Secondary | ICD-10-CM

## 2020-07-28 DIAGNOSIS — I729 Aneurysm of unspecified site: Secondary | ICD-10-CM

## 2020-07-28 DIAGNOSIS — Z01810 Encounter for preprocedural cardiovascular examination: Secondary | ICD-10-CM

## 2020-07-28 DIAGNOSIS — R0600 Dyspnea, unspecified: Secondary | ICD-10-CM

## 2020-07-28 DIAGNOSIS — K31819 Angiodysplasia of stomach and duodenum without bleeding: Secondary | ICD-10-CM

## 2020-07-28 DIAGNOSIS — D62 Acute posthemorrhagic anemia: Secondary | ICD-10-CM

## 2020-07-28 DIAGNOSIS — E669 Obesity, unspecified: Secondary | ICD-10-CM

## 2020-07-28 DIAGNOSIS — I10 Essential (primary) hypertension: Secondary | ICD-10-CM

## 2020-07-28 DIAGNOSIS — G8929 Other chronic pain: Secondary | ICD-10-CM

## 2020-07-28 DIAGNOSIS — D509 Iron deficiency anemia, unspecified: Secondary | ICD-10-CM

## 2020-07-28 DIAGNOSIS — D500 Iron deficiency anemia secondary to blood loss (chronic): Secondary | ICD-10-CM

## 2020-07-28 DIAGNOSIS — Z888 Allergy status to other drugs, medicaments and biological substances status: Secondary | ICD-10-CM

## 2020-07-28 DIAGNOSIS — Z882 Allergy status to sulfonamides status: Secondary | ICD-10-CM

## 2020-07-28 DIAGNOSIS — K760 Fatty (change of) liver, not elsewhere classified: Secondary | ICD-10-CM

## 2020-07-28 DIAGNOSIS — E119 Type 2 diabetes mellitus without complications: Secondary | ICD-10-CM

## 2020-07-28 DIAGNOSIS — E872 Acidosis: Secondary | ICD-10-CM

## 2020-07-28 DIAGNOSIS — K5730 Diverticulosis of large intestine without perforation or abscess without bleeding: Secondary | ICD-10-CM

## 2020-07-28 DIAGNOSIS — Z8262 Family history of osteoporosis: Secondary | ICD-10-CM

## 2020-07-28 DIAGNOSIS — K766 Portal hypertension: Secondary | ICD-10-CM

## 2020-07-28 DIAGNOSIS — Z01818 Encounter for other preprocedural examination: Secondary | ICD-10-CM

## 2020-07-28 DIAGNOSIS — R945 Abnormal results of liver function studies: Secondary | ICD-10-CM

## 2020-07-28 DIAGNOSIS — K7469 Other cirrhosis of liver: Secondary | ICD-10-CM

## 2020-07-28 LAB — GRAM STAIN

## 2020-07-28 LAB — CBC AND DIFF
Lab: 0.1 10*3/uL (ref 0–0.45)
Lab: 0.7 10*3/uL — ABNORMAL LOW (ref 1.0–4.8)
Lab: 1 % (ref 0–2)
Lab: 1 10*3/uL — ABNORMAL HIGH (ref 0–0.80)
Lab: 12 % (ref 4–12)
Lab: 2 % (ref 0–5)
Lab: 2.9 M/UL — ABNORMAL LOW (ref 4.0–5.0)
Lab: 205 10*3/uL (ref 150–400)
Lab: 22 % — ABNORMAL HIGH (ref 11–15)
Lab: 25 % — ABNORMAL LOW (ref 36–45)
Lab: 28 pg (ref 26–34)
Lab: 32 g/dL (ref 32.0–36.0)
Lab: 6.3 K/UL (ref 1.8–7.0)
Lab: 76 % (ref 41–77)
Lab: 8.4 10*3/uL (ref 4.5–11.0)
Lab: 8.4 g/dL — ABNORMAL LOW (ref 12.0–15.0)
Lab: 85 FL (ref 80–100)
Lab: 9 % — ABNORMAL LOW (ref 24–44)

## 2020-07-28 LAB — PROTIME INR (PT): Lab: 1.5 FL — ABNORMAL HIGH (ref 0.8–1.2)

## 2020-07-28 LAB — COMPREHENSIVE METABOLIC PANEL
Lab: 1.1 mg/dL — ABNORMAL HIGH (ref 0.4–1.00)
Lab: 104 MMOL/L (ref 98–110)
Lab: 130 mg/dL — ABNORMAL HIGH (ref 70–100)
Lab: 133 MMOL/L — ABNORMAL LOW (ref 137–147)
Lab: 174 U/L — ABNORMAL HIGH (ref 25–110)
Lab: 2.8 mg/dL — ABNORMAL HIGH (ref 0.3–1.2)
Lab: 23 U/L (ref 7–56)
Lab: 39 mg/dL — ABNORMAL HIGH (ref 7–25)
Lab: 4.4 MMOL/L (ref 3.5–5.1)
Lab: 47 mL/min — ABNORMAL LOW (ref >60)
Lab: 5.7 g/dL — ABNORMAL LOW (ref 6.0–8.0)
Lab: 57 mL/min — ABNORMAL LOW (ref >60)
Lab: 60 U/L — ABNORMAL HIGH (ref 7–40)
Lab: 8 (ref 3–12)
Lab: 8.4 mg/dL — ABNORMAL LOW (ref 8.5–10.6)

## 2020-07-28 MED ORDER — ALBUMIN, HUMAN 25 % IV SOLP
0 refills | Status: CP
Start: 2020-07-28 — End: ?
  Administered 2020-07-28: 15:00:00 37.5 g via INTRAVENOUS

## 2020-07-28 NOTE — Progress Notes
Pt arrived to IR pre/post for procedure work up. VSS on RA. No s/s of acute distress noted. Denies pain and nausea. PIV initiated, pt tolerated well / PIV flushes easily. Procedure and discharge instructions reviewed, pt verbalizes understanding. Pt cart locked in low position, call light within reach. Will continue to monitor patient.

## 2020-07-28 NOTE — Progress Notes
Pt arrived to IR pre/post for recovery. Report obtained from [facility]. VSS on RA. No s/s of acute distress noted. Pt denies pain and nausea. Procedure site CDI. Cart locked in low position, call light within reach. Will continue to monitor patient until recovery complete.

## 2020-07-28 NOTE — Other
Immediate Post Procedure Note    Date:  07/28/2020                                         Attending Physician:   Thomasena Edis  Performing Provider:  Ellsworth Lennox, DO  ?  Consent:  Consent obtained from patient.  Time out performed: Consent obtained, correct patient verified, correct procedure verified, correct site verified, patient marked as necessary.  Pre/Post Procedure Diagnosis:  Cirrhotic liver with ascites  Indications:  ascites  ?  ?  Procedure(s):  IR Paracentesis, therapeutic and diagnostic:  See radiology report  ?  Findings:  Successful paracentesis  Estimated Blood Loss:  None/Negligible  Specimen(s) Removed/Disposition:  Yes, sent to pathology  Complications: None  Patient Tolerated Procedure: Well  Post-Procedure Condition:  stable  ?  Ellsworth Lennox, DO

## 2020-07-28 NOTE — Patient Instructions
Discharge Instructions forParacentesis  Paracentesis is a procedure to remove extra fluid from your belly (abdomen). This fluid buildup in the abdomen is calledascites. The procedure may have been done to take a sample of the fluid. Or, it may have been done to drain the extra fluid from your abdomenand help make you more comfortable.      Ascites is buildup of excess fluid in the abdomen.   Home care   If you have pain after the procedure, your healthcare provider can prescribe or recommend pain medicines. Take these exactly as directed. If you stopped taking other medicines before the procedure, ask your provider when you can start them again.   Take it easy for 24 hours after the procedure. Don't do any physical activity until your provider says it's OK.   You will have a small bandage over the puncture site. Stitches, surgical staples, adhesive tapes, adhesive strips, or surgical glue may be used to close the incision. They also help stop bleeding and speed healing. You may take the bandage off in 24 hours.   Check the puncture site for the signs of infection listed below.    Follow-up care  Make a follow-up appointment with your healthcare provider as directed. During your follow-up visit, your provider will check your healing. Let your provider know how you are feeling. You can also discuss the cause of your ascites and if you need any further treatment. If your fluid is infected, you will be sent home on antibiotics. In some cases, the paracentesis may need to be repeated if the fluid returns. Your provider may also prescribe medicines that increase urination (diuretics) to decrease the buildup of fluid.   When to call your healthcare provider  Call your healthcare provider if you have any of the following after the procedure:   A fever of 100.4 F ( 38.0C) or higher, or as directed by your provider   Chills   Trouble breathing   Pain that doesn't go away even after taking pain medicine   Belly  pain not caused by having the skin punctured   Bleeding from the puncture site   More than a small amount of fluid leaking from the puncture site   Swollen belly   Signs of infection at the puncture site. These include increased pain, redness, or swelling, warmth, or bad-smelling drainage.   Blood in your urine   Feeling dizzy or lightheaded, or fainting  [employer] last reviewed this educational content on 08/19/2018   2000-2021 [employer]. All rights reserved. This information is not intended as a substitute for professional medical care. Always follow your healthcare professional's instructions.

## 2020-07-28 NOTE — H&P (View-Only)
IR Pre-Procedure History and Physical/Sedation Plan    Procedure Date: 07/28/2020     Planned Procedure(s):  Ultrasound-guided paracentesis     Indication:  Fluid testing; Therapeutic drainage  __________________________________________________________________    Chief Complaint:  Ascites    History of Present Illness: Kristin Moody is a 64 y.o. female with a history as listed below who presents today for procedure.    Patient Active Problem List    Diagnosis Date Noted   ? Pleurodynia 06/04/2020   ? Pneumothorax 06/04/2020   ? 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 Hypotension 02/10/2020   ? 'Light-for-dates' infant with signs of fetal malnutrition 01/22/2020   ? 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 Anemia 09/28/2019   ? Encephalopathy 08/12/2019   ? Spasticity 08/10/2019   ? Hyperreflexia 08/10/2019   ? 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 Right leg weakness 05/14/2019   ? 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 Iron (Fe) deficiency anemia 01/19/2019   ? Preop cardiovascular exam 01/07/2019   ? Pre-transplant evaluation for liver transplant 01/06/2019   ? Cirrhosis (HCC) 12/16/2018   ? Acute on chronic anemia 12/16/2018   ? Lactic acidosis 12/16/2018   ? GI bleed 10/24/2018   ? Portal hypertension (HCC) 08/28/2018   ? Confusion 08/27/2018   ? Dyspnea 08/27/2018   ? Chronic abdominal pain 08/27/2018   ? 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 Acute on chronic blood loss anemia 08/27/2018   ? Iron deficiency anemia 04/13/2018   ? Pneumonia due to infectious organism 04/13/2018   ? Melena 04/10/2018   ? Esophageal varices without bleeding (HCC) 02/25/2018   ? 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 Lower abdominal pain 10/24/2014   ? Chest discomfort 09/21/2014   ? Essential hypertension 08/13/2012   ? Abnormal liver function tests 05/13/2011   ? Fatty liver disease, nonalcoholic 05/13/2011   ? Obesity (BMI 30-39.9) 05/13/2011   ? Slow transit constipation 05/13/2011     Medical History:   Diagnosis Date   ? Aneurysm (HCC)    ? 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 Diverticulosis of colon     descending and sigmoid colon   ? Dyspnea    ? Essential hypertension 08/13/2012   ? Fatty infiltration of liver    ? HTN (hypertension)    ? Obesity (BMI 30-39.9) 05/13/2011   ? Preop cardiovascular exam 01/07/2019   ? Sinus infection    ? Type 2 diabetes mellitus (HCC) 09/21/2014      Surgical History:   Procedure Laterality Date   ? HYSTERECTOMY  1994   ? UPPER GASTROINTESTINAL ENDOSCOPY  2009   ? COLONOSCOPY  2009   ? CYSTOCELE REPAIR  2009    with endocele repair   ? RECTOCELE REPAIR  03/2009   ? LIVER BIOPSY  07/17/2010   ? COLONOSCOPY N/A 11/08/2017    Performed by Dawna Part, MD at [facility] ENDO   ? ESOPHAGOGASTRODUODENOSCOPY N/A 11/08/2017    Performed by Dawna Part, MD at [facility] ENDO   ? ESOPHAGOGASTRODUODENOSCOPY WITH BIOPSY - FLEXIBLE  11/08/2017    Performed by Dawna Part, MD at [facility] ENDO   ? COLONOSCOPY WITH HOT BIOPSY FORCEPS REMOVAL TUMOR/ POLYP/ OTHER LESION  11/08/2017    Performed by Dawna Part, MD at [facility] ENDO   ? ESOPHAGOGASTRODUODENOSCOPY WITH BAND LIGATION ESOPHAGEAL/ GASTRIC  VARICES - FLEXIBLE N/A 04/10/2018    Performed by Normajean Baxter, MD at [facility] ENDO   ? ESOPHAGOGASTRODUODENOSCOPY WITH BIOPSY - FLEXIBLE N/A 04/10/2018    Performed by Normajean Baxter, MD at [facility] ENDO   ? ESOPHAGOGASTRODUODENOSCOPY WITH CONTROL OF BLEEDING - FLEXIBLE N/A 04/25/2018    Performed by Jolee Ewing, MD at [facility] ENDO   ? ESOPHAGOGASTRODUODENOSCOPY WITH BIOPSY - FLEXIBLE with push enteroscopy N/A 08/28/2018    Performed by Celesta Gentile, MD at [facility] ENDO   ? EGD N/A 10/27/2018    Performed by Eliott Nine, MD at [facility] ENDO   ? ESOPHAGOGASTRODUODENOSCOPY WITH SNARE REMOVAL TUMOR/ POLYP/ OTHER LESION - FLEXIBLE N/A 10/27/2018    Performed by Eliott Nine, MD at [facility] ENDO   ? ESOPHAGOGASTRODUODENOSCOPY WITH BIOPSY - FLEXIBLE N/A 12/16/2018    Performed by Buckles, Vinnie Level, MD at [facility] ENDO   ? ESOPHAGOGASTRODUODENOSCOPY WITH CONTROL OF BLEEDING - FLEXIBLE N/A 12/16/2018    Performed by Buckles, Vinnie Level, MD at [facility] ENDO   ? ESOPHAGOGASTRODUODENOSCOPY WITH SPECIMEN COLLECTION BY BRUSHING/ WASHING N/A 01/22/2019    Performed by Veneta Penton, MD at [facility] ENDO   ? ESOPHAGOGASTRODUODENOSCOPY WITH DILATION ESOPHAGUS WITH BALLOON 30 MM OR GREATER - FLEXIBLE N/A 01/22/2019    Performed by Veneta Penton, MD at [facility] ENDO   ? ESOPHAGOGASTRODUODENOSCOPY WITH BIOPSY - FLEXIBLE N/A 01/22/2019    Performed by Veneta Penton, MD at [facility] ENDO   ? ESOPHAGOGASTRODUODENOSCOPY WITH BIOPSY - FLEXIBLE N/A 06/19/2019    Performed by Dawna Part, MD at [facility] ENDO   ? ESOPHAGOGASTRODUODENOSCOPY [WITH APC]  WITH SPECIMEN COLLECTION BY BRUSHING/ WASHING N/A 08/07/2019    Performed by Dawna Part, MD at [facility] ENDO   ? ESOPHAGOGASTRODUODENOSCOPY WITH SPECIMEN COLLECTION BY BRUSHING/ WASHING N/A 09/10/2019    Performed by Buckles, Vinnie Level, MD at [facility] ENDO   ? ESOPHAGOGASTRODUODENOSCOPY WITH CONTROL OF BLEEDING - FLEXIBLE N/A 09/10/2019    Performed by Buckles, Vinnie Level, MD at [facility] ENDO   ? ESOPHAGOGASTRODUODENOSCOPY WITH CONTROL OF BLEEDING - FLEXIBLE N/A 02/12/2020    Performed by Lenor Derrick, MD at [facility] ENDO   ? ESOPHAGOGASTRODUODENOSCOPY WITH BIOPSY - FLEXIBLE N/A 02/26/2020    Performed by Buckles, Vinnie Level, MD at [facility] ENDO   ? ESOPHAGOGASTRODUODENOSCOPY WITH BIOPSY - FLEXIBLE N/A 05/18/2020    Performed by Lenor Derrick, MD at [facility] ENDO   ? ESOPHAGOGASTRODUODENOSCOPY WITH CONTROL OF BLEEDING - FLEXIBLE N/A 05/18/2020    Performed by Lenor Derrick, MD at [facility] ENDO   ? ESOPHAGOGASTRODUODENOSCOPY WITH CONTROL OF BLEEDING - FLEXIBLE N/A 07/08/2020    Performed by Vertell Novak, MD at [facility] ENDO   ? CHOLECYSTECTOMY     ? COLONOSCOPY     ? ESOPHAGOGASTRIC FUNDOPLICATION  2003, 2004    laparoscopic   ? ESOPHAGOGASTRIC FUNDOPLICATION     ? HX HYSTERECTOMY     ? PR ESOPHAGOSCOPY FLEXIBLE TRANSORAL DIAGNOSTIC        Social History     Tobacco Use   ? Smoking status: Never Smoker   ? Smokeless tobacco: Never Used   Substance Use Topics   ? Alcohol use: No      Family History   Problem Relation Age of Onset   ? Other Mother    ? Kidney Failure Mother    ? Osteoporosis Mother    ? Cancer Father         esophageal   ? Liver Disease Sister  endstage, s/p liver transplant   ? Cancer Brother         tonsil, liver    ? Hip Fracture Neg Hx       Medications Prior to Admission   Medication Sig Dispense Refill Last Dose   ? abaloparatide (TYMLOS) 80 mcg (3,120 mcg/1.56 mL) injection pen Inject eighty mcg under the skin daily. Administer into the periumbilical region of the abdomen while sitting or lying down in case of orthostatic hypotension. 3.12 mL 3 07/27/2020   ? ciprofloxacin (CIPRO) 500 mg tablet Take one tablet by mouth daily. 30 tablet 1 07/27/2020   ? duloxetine DR (CYMBALTA) 60 mg capsule Take 60 mg by mouth daily.   07/27/2020   ? furosemide (LASIX) 40 mg tablet Take one-half tablet by mouth every morning. 90 tablet 0 07/27/2020   ? lidocaine (ASPERCREME PATCH) 4 % topical patch Apply one each topically to affected area daily. 5 each 1 07/27/2020   ? midodrine (PROAMITINE) 10 mg tablet TAKE 1 TABLET BY MOUTH THREE TIMES A DAY 90 tablet 1 07/27/2020   ? oxyCODONE (ROXICODONE) 5 mg tablet Take one tablet by mouth every 6 hours as needed 20 tablet 0 07/27/2020   ? pantoprazole DR (PROTONIX) 40 mg tablet TAKE 1 TABLET BY MOUTH TWICE A DAY 180 tablet 1 07/27/2020   ? polyethylene glycol 3350 (MIRALAX) 17 g packet Take one packet by mouth twice daily.   07/27/2020   ? promethazine (PHENERGAN) 25 mg tablet Take 25 mg by mouth every 4 hours as needed. Just uses it with Tramadol   07/27/2020   ? rifAXIMin (XIFAXAN) 550 mg tablet Take one tablet by mouth twice daily. 180 tablet 3 07/27/2020   ? zinc sulfate 220 mg (50 mg elemental zinc) capsule Take one capsule by mouth daily. 90 capsule 3 07/27/2020     Allergies   Allergen Reactions   ? Tegaderm BLISTERS   ? Adhesive Tape (Rosins) RASH     Other reaction(s): irritated skin   ? Sulfa (Sulfonamide Antibiotics) RASH     Other reaction(s): Unknown Reaction   ? Codeine NAUSEA AND VOMITING   ? Morphine SEE COMMENTS     Pt reports burns her veins  Other reaction(s): Unknown Reaction   ? Penicillin G SEE COMMENTS     Throat issues, blisters in throat    ? Tramadol NAUSEA ONLY and ITCHING     Takes this medication regularly        Review of Systems  Constitutional: negative for fevers  Respiratory: negative for cough or increased work of breathing  Cardiovascular: negative for chest pain  Gastrointestinal: positive for nausea and abdominal pain    Physical Exam:  Vital Signs: Last Filed In 24 Hours Vital Signs: 24 Hour Range   BP: 118/55 (09/08 1419)  Temp: 36.7 ?C (98 ?F) (09/08 1419)  Pulse: 88 (09/08 1419)  Respirations: 16 PER MINUTE (09/08 1419)  SpO2: 100 % (09/08 1419) BP: (110-119)/(36-55)   Temp:  [36.5 ?C (97.7 ?F)-36.8 ?C (98.3 ?F)]   Pulse:  [85-91]   Respirations:  [16 PER MINUTE-18 PER MINUTE]   SpO2:  [99 %-100 %]           General appearance: alert and no distress noted.  Neurologic: Grossly normal.  Lungs: Non labored.  Heart: regular rate and rhythm  Abdomen: distended    Pre-procedure anxiolysis plan: N/A  Sedation/Medication Plan: Local anesthetic  Personal history of sedation complications: Denies adverse event.   Family history  of sedation complications: Denies adverse event.   Medications for Reversal: NA  Discussion/Reviews:  Physician has discussed risks and alternatives of this type of sedation and above planned procedures with patient    NPO Status: NA  Airway:  NA  Head and Neck: NA  Mouth: NA   Anesthesia Classification:  ASA III (A patient with a severe systemic disease that limits activity, but is not incapacitating)  Pregnancy Status: N/A    Lab/Radiology/Other Diagnostic Tests:  Labs:  Pertinent labs reviewed           Samson Frederic, APRN-NP  Pager [phone]

## 2020-07-29 LAB — CELL COUNT W/DIFF-FLUIDS
Lab: 22 %
Lab: 3 %
Lab: 50 /uL
Lab: 75 %
Lab: <10 /uL

## 2020-08-01 ENCOUNTER — Encounter: Admit: 2020-08-01 | Discharge: 2020-08-01 | Payer: MEDICARE

## 2020-08-01 ENCOUNTER — Ambulatory Visit: Admit: 2020-08-01 | Discharge: 2020-08-01 | Payer: MEDICARE

## 2020-08-01 DIAGNOSIS — D649 Anemia, unspecified: Secondary | ICD-10-CM

## 2020-08-01 DIAGNOSIS — K31819 Angiodysplasia of stomach and duodenum without bleeding: Secondary | ICD-10-CM

## 2020-08-01 DIAGNOSIS — D500 Iron deficiency anemia secondary to blood loss (chronic): Secondary | ICD-10-CM

## 2020-08-01 DIAGNOSIS — K7469 Other cirrhosis of liver: Secondary | ICD-10-CM

## 2020-08-01 DIAGNOSIS — Z01812 Encounter for preprocedural laboratory examination: Secondary | ICD-10-CM

## 2020-08-01 DIAGNOSIS — R945 Abnormal results of liver function studies: Secondary | ICD-10-CM

## 2020-08-01 DIAGNOSIS — K760 Fatty (change of) liver, not elsewhere classified: Secondary | ICD-10-CM

## 2020-08-01 DIAGNOSIS — Z01818 Encounter for other preprocedural examination: Secondary | ICD-10-CM

## 2020-08-01 LAB — CBC AND DIFF
Lab: 0.6 K/UL — ABNORMAL LOW (ref 1.0–4.8)
Lab: 2.5 M/UL — ABNORMAL LOW (ref 4.0–5.0)
Lab: 21 % — ABNORMAL LOW (ref 36–45)
Lab: 6.9 10*3/uL (ref 4.5–11.0)

## 2020-08-01 LAB — COMPREHENSIVE METABOLIC PANEL
Lab: 1.2 mg/dL — ABNORMAL HIGH (ref 0.4–1.00)
Lab: 1.9 mg/dL — ABNORMAL HIGH (ref 0.3–1.2)
Lab: 105 MMOL/L (ref 98–110)
Lab: 134 MMOL/L — ABNORMAL LOW (ref 137–147)
Lab: 158 mg/dL — ABNORMAL HIGH (ref 70–100)
Lab: 175 U/L — ABNORMAL HIGH (ref 25–110)
Lab: 22 U/L — ABNORMAL HIGH (ref 7–56)
Lab: 4.3 MMOL/L (ref 3.5–5.1)
Lab: 44 mL/min — ABNORMAL LOW (ref >60)
Lab: 5.5 g/dL — ABNORMAL LOW (ref 6.0–8.0)
Lab: 53 mL/min — ABNORMAL LOW (ref >60)
Lab: 6 10*3/uL (ref 3–12)
Lab: 8.6 mg/dL (ref 8.5–10.6)

## 2020-08-01 LAB — PROTIME INR (PT): Lab: 1.5 g/dL — ABNORMAL HIGH (ref 0.8–1.2)

## 2020-08-01 NOTE — Telephone Encounter
Pt called with questions in regards to her upcoming appt on 9/14

## 2020-08-02 ENCOUNTER — Encounter: Admit: 2020-08-02 | Discharge: 2020-08-02 | Payer: MEDICARE

## 2020-08-02 ENCOUNTER — Ambulatory Visit: Admit: 2020-08-02 | Discharge: 2020-08-02 | Payer: MEDICARE

## 2020-08-02 DIAGNOSIS — E669 Obesity, unspecified: Secondary | ICD-10-CM

## 2020-08-02 DIAGNOSIS — N186 End stage renal disease: Secondary | ICD-10-CM

## 2020-08-02 DIAGNOSIS — K7460 Unspecified cirrhosis of liver: Secondary | ICD-10-CM

## 2020-08-02 DIAGNOSIS — K5730 Diverticulosis of large intestine without perforation or abscess without bleeding: Secondary | ICD-10-CM

## 2020-08-02 DIAGNOSIS — R188 Other ascites: Secondary | ICD-10-CM

## 2020-08-02 DIAGNOSIS — R0600 Dyspnea, unspecified: Secondary | ICD-10-CM

## 2020-08-02 DIAGNOSIS — Z01810 Encounter for preprocedural cardiovascular examination: Secondary | ICD-10-CM

## 2020-08-02 DIAGNOSIS — Z20822 Encounter for screening laboratory testing for COVID-19 virus in asymptomatic patient: Secondary | ICD-10-CM

## 2020-08-02 DIAGNOSIS — N390 Urinary tract infection, site not specified: Secondary | ICD-10-CM

## 2020-08-02 DIAGNOSIS — M818 Other osteoporosis without current pathological fracture: Secondary | ICD-10-CM

## 2020-08-02 DIAGNOSIS — E119 Type 2 diabetes mellitus without complications: Secondary | ICD-10-CM

## 2020-08-02 DIAGNOSIS — I10 Essential (primary) hypertension: Secondary | ICD-10-CM

## 2020-08-02 DIAGNOSIS — D62 Acute posthemorrhagic anemia: Secondary | ICD-10-CM

## 2020-08-02 DIAGNOSIS — K7290 Hepatic failure, unspecified without coma: Secondary | ICD-10-CM

## 2020-08-02 DIAGNOSIS — K760 Fatty (change of) liver, not elsewhere classified: Secondary | ICD-10-CM

## 2020-08-02 DIAGNOSIS — J329 Chronic sinusitis, unspecified: Secondary | ICD-10-CM

## 2020-08-02 DIAGNOSIS — K7030 Alcoholic cirrhosis of liver without ascites: Secondary | ICD-10-CM

## 2020-08-02 DIAGNOSIS — K31819 Angiodysplasia of stomach and duodenum without bleeding: Secondary | ICD-10-CM

## 2020-08-02 DIAGNOSIS — I729 Aneurysm of unspecified site: Secondary | ICD-10-CM

## 2020-08-02 DIAGNOSIS — Z713 Dietary counseling and surveillance: Secondary | ICD-10-CM

## 2020-08-02 MED ORDER — ONDANSETRON HCL 4 MG PO TAB
4 mg | ORAL_TABLET | ORAL | 1 refills | 8.00000 days | Status: AC | PRN
Start: 2020-08-02 — End: ?

## 2020-08-03 ENCOUNTER — Encounter: Admit: 2020-08-03 | Discharge: 2020-08-03 | Payer: MEDICARE

## 2020-08-03 ENCOUNTER — Ambulatory Visit: Admit: 2020-08-03 | Discharge: 2020-08-03 | Payer: MEDICARE

## 2020-08-03 DIAGNOSIS — D62 Acute posthemorrhagic anemia: Secondary | ICD-10-CM

## 2020-08-03 DIAGNOSIS — Z01810 Encounter for preprocedural cardiovascular examination: Secondary | ICD-10-CM

## 2020-08-03 DIAGNOSIS — R0600 Dyspnea, unspecified: Secondary | ICD-10-CM

## 2020-08-03 DIAGNOSIS — K31819 Angiodysplasia of stomach and duodenum without bleeding: Secondary | ICD-10-CM

## 2020-08-03 DIAGNOSIS — K7460 Unspecified cirrhosis of liver: Secondary | ICD-10-CM

## 2020-08-03 DIAGNOSIS — K5730 Diverticulosis of large intestine without perforation or abscess without bleeding: Secondary | ICD-10-CM

## 2020-08-03 DIAGNOSIS — I10 Essential (primary) hypertension: Secondary | ICD-10-CM

## 2020-08-03 DIAGNOSIS — J329 Chronic sinusitis, unspecified: Secondary | ICD-10-CM

## 2020-08-03 DIAGNOSIS — I729 Aneurysm of unspecified site: Secondary | ICD-10-CM

## 2020-08-03 DIAGNOSIS — K760 Fatty (change of) liver, not elsewhere classified: Secondary | ICD-10-CM

## 2020-08-03 DIAGNOSIS — E119 Type 2 diabetes mellitus without complications: Secondary | ICD-10-CM

## 2020-08-03 DIAGNOSIS — E669 Obesity, unspecified: Secondary | ICD-10-CM

## 2020-08-03 NOTE — Progress Notes
Pt. arrived to [facility] infusion room for 2 units of RBCs transfusion. VSS, PIV placed and assessment complete; please refer to doc flowsheet for details. 2 units of blood transfused without incident. No transfusion hypersensitivity reaction noted. VSS throughout infusions, please refer to doc flowsheet. PIV deaccessed per protocol. All questions and concerns addressed. Copy of AVS provided. Pt. left unit with husband via wheelchair in stable condition.

## 2020-08-04 ENCOUNTER — Encounter: Admit: 2020-08-04 | Discharge: 2020-08-04 | Payer: MEDICARE

## 2020-08-04 ENCOUNTER — Ambulatory Visit: Admit: 2020-08-04 | Discharge: 2020-08-04 | Payer: MEDICARE

## 2020-08-04 DIAGNOSIS — E119 Type 2 diabetes mellitus without complications: Secondary | ICD-10-CM

## 2020-08-04 DIAGNOSIS — K760 Fatty (change of) liver, not elsewhere classified: Secondary | ICD-10-CM

## 2020-08-04 DIAGNOSIS — I10 Essential (primary) hypertension: Secondary | ICD-10-CM

## 2020-08-04 DIAGNOSIS — D500 Iron deficiency anemia secondary to blood loss (chronic): Secondary | ICD-10-CM

## 2020-08-04 DIAGNOSIS — I729 Aneurysm of unspecified site: Secondary | ICD-10-CM

## 2020-08-04 DIAGNOSIS — J329 Chronic sinusitis, unspecified: Secondary | ICD-10-CM

## 2020-08-04 DIAGNOSIS — K7460 Unspecified cirrhosis of liver: Secondary | ICD-10-CM

## 2020-08-04 DIAGNOSIS — Z01810 Encounter for preprocedural cardiovascular examination: Secondary | ICD-10-CM

## 2020-08-04 DIAGNOSIS — K31819 Angiodysplasia of stomach and duodenum without bleeding: Secondary | ICD-10-CM

## 2020-08-04 DIAGNOSIS — E669 Obesity, unspecified: Secondary | ICD-10-CM

## 2020-08-04 DIAGNOSIS — K5730 Diverticulosis of large intestine without perforation or abscess without bleeding: Secondary | ICD-10-CM

## 2020-08-04 DIAGNOSIS — R0600 Dyspnea, unspecified: Secondary | ICD-10-CM

## 2020-08-04 DIAGNOSIS — K7469 Other cirrhosis of liver: Secondary | ICD-10-CM

## 2020-08-04 LAB — COMPREHENSIVE METABOLIC PANEL
Lab: 1.3 mg/dL — ABNORMAL HIGH (ref 0.4–1.00)
Lab: 103 MMOL/L (ref 98–110)
Lab: 131 mg/dL — ABNORMAL HIGH (ref 70–100)
Lab: 133 MMOL/L — ABNORMAL LOW (ref 137–147)
Lab: 159 U/L — ABNORMAL HIGH (ref 25–110)
Lab: 21 MMOL/L (ref 21–30)
Lab: 21 U/L (ref 7–56)
Lab: 3 g/dL — ABNORMAL LOW (ref 3.5–5.0)
Lab: 3.7 mg/dL — ABNORMAL HIGH (ref 0.3–1.2)
Lab: 4.5 MMOL/L (ref 3.5–5.1)
Lab: 40 mL/min — ABNORMAL LOW (ref >60)
Lab: 48 mL/min — ABNORMAL LOW (ref >60)
Lab: 5.8 g/dL — ABNORMAL LOW (ref 6.0–8.0)
Lab: 51 mg/dL — ABNORMAL HIGH (ref 7–25)
Lab: 57 U/L — ABNORMAL HIGH (ref 7–40)
Lab: 8.7 mg/dL (ref 8.5–10.6)
Lab: 9 (ref 3–12)

## 2020-08-04 LAB — CBC
Lab: 19 % — ABNORMAL HIGH (ref 11–15)
Lab: 2.9 M/UL — ABNORMAL LOW (ref 4.0–5.0)
Lab: 23 % — ABNORMAL LOW (ref 36–45)
Lab: 242 10*3/uL (ref 150–400)
Lab: 28 pg (ref 26–34)
Lab: 34 g/dL (ref 32.0–36.0)
Lab: 8.2 g/dL — ABNORMAL LOW (ref 12.0–15.0)
Lab: 8.4 FL (ref 7–11)
Lab: 8.5 10*3/uL (ref 4.5–11.0)
Lab: 82 FL (ref 80–100)

## 2020-08-04 LAB — GRAM STAIN

## 2020-08-04 LAB — PROTIME INR (PT): Lab: 1.4 — ABNORMAL HIGH (ref 0.8–1.2)

## 2020-08-04 MED ORDER — ALBUMIN, HUMAN 25 % IV SOLP
0 refills | Status: AC
Start: 2020-08-04 — End: ?
  Administered 2020-08-04 (×3): 12.5 g via INTRAVENOUS

## 2020-08-04 NOTE — Telephone Encounter
Pre procedure review  EGD - Hx gastric polyp, GAVE  Referring: Dr. Maryruth Bun  Records in procedures

## 2020-08-04 NOTE — H&P (View-Only)
IR Pre-Procedure History and Physical/Sedation Plan    Procedure Date: 08/04/2020     Planned Procedure(s):  Ultrasound-guided paracentesis     Indication:  Fluid testing; Therapeutic drainage  __________________________________________________________________    Chief Complaint:  Ascites    History of Present Illness: Kristin Moody is a 64 y.o. female with a history as listed below who presents today for procedure.    Patient Active Problem List    Diagnosis Date Noted   ? Pleurodynia 06/04/2020   ? Pneumothorax 06/04/2020   ? 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 Hypotension 02/10/2020   ? 'Light-for-dates' infant with signs of fetal malnutrition 01/22/2020   ? 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 Anemia 09/28/2019   ? Encephalopathy 08/12/2019   ? Spasticity 08/10/2019   ? Hyperreflexia 08/10/2019   ? 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 Right leg weakness 05/14/2019   ? 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 Iron (Fe) deficiency anemia 01/19/2019   ? Preop cardiovascular exam 01/07/2019   ? Pre-transplant evaluation for liver transplant 01/06/2019   ? Cirrhosis (HCC) 12/16/2018   ? Acute on chronic anemia 12/16/2018   ? Lactic acidosis 12/16/2018   ? GI bleed 10/24/2018   ? Portal hypertension (HCC) 08/28/2018   ? Confusion 08/27/2018   ? Dyspnea 08/27/2018   ? Chronic abdominal pain 08/27/2018   ? 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 Acute on chronic blood loss anemia 08/27/2018   ? Iron deficiency anemia 04/13/2018   ? Pneumonia due to infectious organism 04/13/2018   ? Melena 04/10/2018   ? Esophageal varices without bleeding (HCC) 02/25/2018   ? 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 Lower abdominal pain 10/24/2014   ? Chest discomfort 09/21/2014   ? Essential hypertension 08/13/2012   ? Abnormal liver function tests 05/13/2011   ? Fatty liver disease, nonalcoholic 05/13/2011   ? Obesity (BMI 30-39.9) 05/13/2011   ? Slow transit constipation 05/13/2011     Medical History:   Diagnosis Date   ? Aneurysm (HCC)    ? 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 Diverticulosis of colon     descending and sigmoid colon   ? Dyspnea    ? Essential hypertension 08/13/2012   ? Fatty infiltration of liver    ? HTN (hypertension)    ? Obesity (BMI 30-39.9) 05/13/2011   ? Preop cardiovascular exam 01/07/2019   ? Sinus infection    ? Type 2 diabetes mellitus (HCC) 09/21/2014      Surgical History:   Procedure Laterality Date   ? HYSTERECTOMY  1994   ? UPPER GASTROINTESTINAL ENDOSCOPY  2009   ? COLONOSCOPY  2009   ? CYSTOCELE REPAIR  2009    with endocele repair   ? RECTOCELE REPAIR  03/2009   ? LIVER BIOPSY  07/17/2010   ? COLONOSCOPY N/A 11/08/2017    Performed by Dawna Part, MD at [facility] ENDO   ? ESOPHAGOGASTRODUODENOSCOPY N/A 11/08/2017    Performed by Dawna Part, MD at [facility] ENDO   ? ESOPHAGOGASTRODUODENOSCOPY WITH BIOPSY - FLEXIBLE  11/08/2017    Performed by Dawna Part, MD at [facility] ENDO   ? COLONOSCOPY WITH HOT BIOPSY FORCEPS REMOVAL TUMOR/ POLYP/ OTHER LESION  11/08/2017    Performed by Dawna Part, MD at [facility] ENDO   ? ESOPHAGOGASTRODUODENOSCOPY WITH BAND LIGATION ESOPHAGEAL/ GASTRIC  VARICES - FLEXIBLE N/A 04/10/2018    Performed by Normajean Baxter, MD at [facility] ENDO   ? ESOPHAGOGASTRODUODENOSCOPY WITH BIOPSY - FLEXIBLE N/A 04/10/2018    Performed by Normajean Baxter, MD at [facility] ENDO   ? ESOPHAGOGASTRODUODENOSCOPY WITH CONTROL OF BLEEDING - FLEXIBLE N/A 04/25/2018    Performed by Jolee Ewing, MD at [facility] ENDO   ? ESOPHAGOGASTRODUODENOSCOPY WITH BIOPSY - FLEXIBLE with push enteroscopy N/A 08/28/2018    Performed by Celesta Gentile, MD at [facility] ENDO   ? EGD N/A 10/27/2018    Performed by Eliott Nine, MD at [facility] ENDO   ? ESOPHAGOGASTRODUODENOSCOPY WITH SNARE REMOVAL TUMOR/ POLYP/ OTHER LESION - FLEXIBLE N/A 10/27/2018    Performed by Eliott Nine, MD at [facility] ENDO   ? ESOPHAGOGASTRODUODENOSCOPY WITH BIOPSY - FLEXIBLE N/A 12/16/2018    Performed by Buckles, Vinnie Level, MD at [facility] ENDO   ? ESOPHAGOGASTRODUODENOSCOPY WITH CONTROL OF BLEEDING - FLEXIBLE N/A 12/16/2018    Performed by Buckles, Vinnie Level, MD at [facility] ENDO   ? ESOPHAGOGASTRODUODENOSCOPY WITH SPECIMEN COLLECTION BY BRUSHING/ WASHING N/A 01/22/2019    Performed by Veneta Penton, MD at [facility] ENDO   ? ESOPHAGOGASTRODUODENOSCOPY WITH DILATION ESOPHAGUS WITH BALLOON 30 MM OR GREATER - FLEXIBLE N/A 01/22/2019    Performed by Veneta Penton, MD at [facility] ENDO   ? ESOPHAGOGASTRODUODENOSCOPY WITH BIOPSY - FLEXIBLE N/A 01/22/2019    Performed by Veneta Penton, MD at [facility] ENDO   ? ESOPHAGOGASTRODUODENOSCOPY WITH BIOPSY - FLEXIBLE N/A 06/19/2019    Performed by Dawna Part, MD at [facility] ENDO   ? ESOPHAGOGASTRODUODENOSCOPY [WITH APC]  WITH SPECIMEN COLLECTION BY BRUSHING/ WASHING N/A 08/07/2019    Performed by Dawna Part, MD at [facility] ENDO   ? ESOPHAGOGASTRODUODENOSCOPY WITH SPECIMEN COLLECTION BY BRUSHING/ WASHING N/A 09/10/2019    Performed by Buckles, Vinnie Level, MD at [facility] ENDO   ? ESOPHAGOGASTRODUODENOSCOPY WITH CONTROL OF BLEEDING - FLEXIBLE N/A 09/10/2019    Performed by Buckles, Vinnie Level, MD at [facility] ENDO   ? ESOPHAGOGASTRODUODENOSCOPY WITH CONTROL OF BLEEDING - FLEXIBLE N/A 02/12/2020    Performed by Lenor Derrick, MD at [facility] ENDO   ? ESOPHAGOGASTRODUODENOSCOPY WITH BIOPSY - FLEXIBLE N/A 02/26/2020    Performed by Buckles, Vinnie Level, MD at [facility] ENDO   ? ESOPHAGOGASTRODUODENOSCOPY WITH BIOPSY - FLEXIBLE N/A 05/18/2020    Performed by Lenor Derrick, MD at [facility] ENDO   ? ESOPHAGOGASTRODUODENOSCOPY WITH CONTROL OF BLEEDING - FLEXIBLE N/A 05/18/2020    Performed by Lenor Derrick, MD at [facility] ENDO   ? ESOPHAGOGASTRODUODENOSCOPY WITH CONTROL OF BLEEDING - FLEXIBLE N/A 07/08/2020    Performed by Vertell Novak, MD at [facility] ENDO   ? CHOLECYSTECTOMY     ? COLONOSCOPY     ? ESOPHAGOGASTRIC FUNDOPLICATION  2003, 2004    laparoscopic   ? ESOPHAGOGASTRIC FUNDOPLICATION     ? HX HYSTERECTOMY     ? PR ESOPHAGOSCOPY FLEXIBLE TRANSORAL DIAGNOSTIC        Social History     Tobacco Use   ? Smoking status: Never Smoker   ? Smokeless tobacco: Never Used   Substance Use Topics   ? Alcohol use: No      Family History   Problem Relation Age of Onset   ? Other Mother    ? Kidney Failure Mother    ? Osteoporosis Mother    ? Cancer Father         esophageal   ? Liver Disease Sister  endstage, s/p liver transplant   ? Cancer Brother         tonsil, liver    ? Hip Fracture Neg Hx       Medications Prior to Admission   Medication Sig Dispense Refill Last Dose   ? abaloparatide (TYMLOS) 80 mcg (3,120 mcg/1.56 mL) injection pen Inject eighty mcg under the skin daily. Administer into the periumbilical region of the abdomen while sitting or lying down in case of orthostatic hypotension. 3.12 mL 3 Unknown   ? ciprofloxacin (CIPRO) 500 mg tablet Take one tablet by mouth daily. 30 tablet 1 08/03/2020   ? duloxetine DR (CYMBALTA) 60 mg capsule Take 60 mg by mouth daily.   08/04/2020   ? furosemide (LASIX) 40 mg tablet Take one-half tablet by mouth every morning. 90 tablet 0 08/04/2020   ? lidocaine (ASPERCREME PATCH) 4 % topical patch Apply one each topically to affected area daily. 5 each 1 >1 Month   ? midodrine (PROAMITINE) 10 mg tablet TAKE 1 TABLET BY MOUTH THREE TIMES A DAY 90 tablet 1 08/03/2020   ? nitrofurantoin monohyd/m-cryst (MACROBID) 100 mg capsule Take one capsule by mouth every 6 hours. Take with food.  Indications: an infection of the genitals or urinary tract 20 capsule 0 08/04/2020   ? ondansetron HCL (ZOFRAN) 4 mg tablet Take one tablet by mouth every 6 hours as needed for Nausea or Vomiting. 120 tablet 1 Unknown   ? oxyCODONE (ROXICODONE) 5 mg tablet Take one tablet by mouth every 6 hours as needed 20 tablet 0 Unknown   ? pantoprazole DR (PROTONIX) 40 mg tablet TAKE 1 TABLET BY MOUTH TWICE A DAY 180 tablet 1 08/04/2020   ? polyethylene glycol 3350 (MIRALAX) 17 g packet Take one packet by mouth twice daily.   08/03/2020   ? promethazine (PHENERGAN) 25 mg tablet Take 25 mg by mouth every 4 hours as needed. Just uses it with Tramadol   08/04/2020   ? rifAXIMin (XIFAXAN) 550 mg tablet Take one tablet by mouth twice daily. 180 tablet 3 08/04/2020   ? zinc sulfate 220 mg (50 mg elemental zinc) capsule Take one capsule by mouth daily. 90 capsule 3 08/04/2020     Allergies   Allergen Reactions   ? Tegaderm BLISTERS   ? Adhesive Tape (Rosins) RASH     Other reaction(s): irritated skin   ? Sulfa (Sulfonamide Antibiotics) RASH     Other reaction(s): Unknown Reaction   ? Codeine NAUSEA AND VOMITING   ? Morphine SEE COMMENTS     Pt reports burns her veins  Other reaction(s): Unknown Reaction   ? Penicillin G SEE COMMENTS     Throat issues, blisters in throat    ? Tramadol NAUSEA ONLY and ITCHING     Takes this medication regularly        Review of Systems  A comprehensive review of systems was negative except for: Gastrointestinal: positive for abdominal pain    Physical Exam:  Vital Signs: Last Filed In 24 Hours Vital Signs: 24 Hour Range   BP: 118/58 (09/15 1908)  Temp: 36.7 ?C (98 ?F) (09/15 1908)  Pulse: 94 (09/15 1908)  Respirations: 16 PER MINUTE (09/15 1908)  SpO2: 99 % (09/15 1908) BP: (115-129)/(56-78)   Temp:  [36.4 ?C (97.5 ?F)-36.7 ?C (98.1 ?F)]   Pulse:  [89-97]   Respirations:  [16 PER MINUTE-18 PER MINUTE]   SpO2:  [97 %-100 %]           General  appearance: alert and no distress noted.  Neurologic: Grossly normal.  Lungs: Non labored.  Heart: regular rate and rhythm  Abdomen: distended    Pre-procedure anxiolysis plan: N/A  Sedation/Medication Plan: Local anesthetic  Personal history of sedation complications: Denies adverse event.   Family history of sedation complications: Denies adverse event.   Medications for Reversal: NA  Discussion/Reviews:  Physician has discussed risks and alternatives of this type of sedation and above planned procedures with patient    NPO Status: NA  Airway:  NA  Head and Neck: NA  Mouth: NA   Anesthesia Classification:  ASA III (A patient with a severe systemic disease that limits activity, but is not incapacitating)  Pregnancy Status: Not Pregnant    Lab/Radiology/Other Diagnostic Tests:  Labs:  Pertinent labs reviewed           Rance Smithson P Divine-Thiele, APRN-NP  Pager [phone]

## 2020-08-04 NOTE — Other
Immediate Post Procedure Note    Date:  08/04/2020                                         Attending Physician:   Cameron Ali  Performing Provider:  Ladona Ridgel, DO    Consent:  Consent obtained from patient.  Time out performed: Consent obtained, correct patient verified, correct procedure verified, correct site verified, patient marked as necessary.  Pre/Post Procedure Diagnosis:  Ascites  Indications:  Ascites      Procedure(s):  Paracentesis  Findings:  Successful paracentesis     Estimated Blood Loss:  None/Negligible  Specimen(s) Removed/Disposition:  Yes, sent to pathology  Complications: None  Patient Tolerated Procedure: Well  Post-Procedure Condition:  stable    Ladona Ridgel, DO

## 2020-08-04 NOTE — Patient Instructions
IR-PARACENTESIS   A paracentesis is the removal of an abnormal buildup of fluid in your abdominal cavity. This fluid buildup is called ascites and may be caused by conditions such as liver disease, heart failure, or cancer. During this procedure, a needle is inserted into your abdomen to drain the fluid. The fluid may then be sent to the lab for testing if medically indicated. Removal of the fluid may also relieve belly pressure and shortness of breath caused by the ascites.?  POST-PROCEDURE PAIN:   ? Pain control following your procedure is a priority for both you and your Physicians.  ? Some soreness or tenderness at the site is to be expected for several days. We recommend taking over the counter analgesics to help relieve this pain.  ? Alternative methods for pain relief include but not limited to heat or cold compress, relaxation techniques, rest, and changing of positions.  ? If pain continues after 5-7 days or you have severe pain not relieved by medication, please contact us as directed below.?  POST-PROCEDURE ACTIVITY:   ? A responsible adult must drive you home.  ? If you receive sedation, narcotic pain medication or anesthesia for the procedure, you should not drive or operate heavy machinery or do anything that requires concentration for at least 24 hours after procedure completion.  ? It is recommended that a responsible adult be with you until morning.?  POST-PROCEDURE SITE CARE:   ? You will have a small bandage over the procedure site. Keep this dry.  ? You may remove it in 24 hours.  ? You may shower in 24 hours, after removing the bandage.  ? A dry gauze bandage may be reapplied as necessary to protect your clothing as the site may sometimes leak for several days after the procedure.  ? Do not submerge the procedure site for 1 week (no bathtub, swimming, hot tub, etc.)  ? Do not use ointments, creams or powders on the puncture site.  ? Be sure your hands are clean when touching near the site.?  DIET/MEDICATIONS:   ? You may resume your previous diet after the procedure.  ? If you receive sedation or narcotic pain medications, avoid any foods or beverages containing alcohol for at least 24 hours after the procedure.  ? Please see the Medication Reconciliation sheet for instructions regarding resuming your home medications.?  CALL THE DOCTOR IF:   ? Bright red blood soaks the bandage.  ? You have pain not relieved by medication. Some soreness at the site is to be expected.  ? You have signs of infection such as: fever greater than 101F, chills, redness, warmth, swelling, drainage or pus from the puncture site.  For any of the above symptoms or for problems or concerns related to the procedure performed at the [facility], call [phone] Monday-Friday from 7-5p. After-hours and weekends, please call [phone] and ask for the Interventional Radiology Resident on-call.   You or your caregiver should call 911 for any severe symptoms such as excessive bleeding, severe dizziness, trouble breathing or loss of consciousness.   ?  ?

## 2020-08-05 ENCOUNTER — Encounter: Admit: 2020-08-05 | Discharge: 2020-08-05 | Payer: MEDICARE

## 2020-08-05 ENCOUNTER — Ambulatory Visit: Admit: 2020-08-05 | Discharge: 2020-08-05 | Payer: MEDICARE

## 2020-08-05 DIAGNOSIS — Z01810 Encounter for preprocedural cardiovascular examination: Secondary | ICD-10-CM

## 2020-08-05 DIAGNOSIS — K760 Fatty (change of) liver, not elsewhere classified: Secondary | ICD-10-CM

## 2020-08-05 DIAGNOSIS — J329 Chronic sinusitis, unspecified: Secondary | ICD-10-CM

## 2020-08-05 DIAGNOSIS — K7460 Unspecified cirrhosis of liver: Secondary | ICD-10-CM

## 2020-08-05 DIAGNOSIS — I729 Aneurysm of unspecified site: Secondary | ICD-10-CM

## 2020-08-05 DIAGNOSIS — Z20822 Encounter for screening laboratory testing for COVID-19 virus in asymptomatic patient: Secondary | ICD-10-CM

## 2020-08-05 DIAGNOSIS — R0600 Dyspnea, unspecified: Secondary | ICD-10-CM

## 2020-08-05 DIAGNOSIS — K31819 Angiodysplasia of stomach and duodenum without bleeding: Secondary | ICD-10-CM

## 2020-08-05 DIAGNOSIS — E669 Obesity, unspecified: Secondary | ICD-10-CM

## 2020-08-05 DIAGNOSIS — K5730 Diverticulosis of large intestine without perforation or abscess without bleeding: Secondary | ICD-10-CM

## 2020-08-05 DIAGNOSIS — Z8719 Personal history of other diseases of the digestive system: Secondary | ICD-10-CM

## 2020-08-05 DIAGNOSIS — I10 Essential (primary) hypertension: Secondary | ICD-10-CM

## 2020-08-05 DIAGNOSIS — E119 Type 2 diabetes mellitus without complications: Secondary | ICD-10-CM

## 2020-08-05 DIAGNOSIS — K317 Polyp of stomach and duodenum: Secondary | ICD-10-CM

## 2020-08-05 LAB — POC GLUCOSE
Lab: 119 mg/dL — ABNORMAL HIGH (ref 70–100)
Lab: 120 mg/dL — ABNORMAL HIGH (ref 70–100)

## 2020-08-05 MED ORDER — LIDOCAINE (PF) 20 MG/ML (2 %) IJ SOLN
INTRAVENOUS | 0 refills | Status: DC
Start: 2020-08-05 — End: 2020-08-05
  Administered 2020-08-05: 17:00:00 60 mg via INTRAVENOUS

## 2020-08-05 MED ORDER — PROPOFOL 10 MG/ML IV EMUL 20 ML (INFUSION)(AM)(OR)
INTRAVENOUS | 0 refills | Status: DC
Start: 2020-08-05 — End: 2020-08-05
  Administered 2020-08-05: 17:00:00 130 ug/kg/min via INTRAVENOUS

## 2020-08-05 MED ORDER — LACTATED RINGERS IV SOLP
1000 mL | INTRAVENOUS | 0 refills | Status: DC
Start: 2020-08-05 — End: 2020-08-05
  Administered 2020-08-05: 17:00:00 1000.000 mL via INTRAVENOUS

## 2020-08-05 NOTE — Anesthesia Pre-Procedure Evaluation
Anesthesia Pre-Procedure Evaluation    Name: Kristin Moody      MRN: [id number]     DOB: 06/13/1956     Age: 64 y.o.     Sex: female   _________________________________________________________________________     Procedure Date: 08/05/2020   Procedure: Procedure(s):  EGD     Physical Assessment  Vital Signs (last filed in past 24 hours):  BP: 122/61 (09/17 1122)  Temp: 36.7 ?C (98.1 ?F) (09/17 1122)  Pulse: 95 (09/17 1122)  Respirations: 22 PER MINUTE (09/17 1122)  SpO2: 99 % (09/17 1122)  Height: 160 cm (63) (09/17 1122)  Weight: 68 kg (150 lb) (09/17 1122)      Patient History  Allergies   Allergen Reactions   ? Tegaderm BLISTERS   ? Adhesive Tape (Rosins) RASH     Other reaction(s): irritated skin   ? Sulfa (Sulfonamide Antibiotics) RASH     Other reaction(s): Unknown Reaction   ? Codeine NAUSEA AND VOMITING   ? Morphine SEE COMMENTS     Pt reports burns her veins  Other reaction(s): Unknown Reaction   ? Penicillin G SEE COMMENTS     Throat issues, blisters in throat    ? Tramadol NAUSEA ONLY and ITCHING     Takes this medication regularly         Current Medications    Medication Directions   abaloparatide (TYMLOS) 80 mcg (3,120 mcg/1.56 mL) injection pen Inject eighty mcg under the skin daily. Administer into the periumbilical region of the abdomen while sitting or lying down in case of orthostatic hypotension.   ciprofloxacin (CIPRO) 500 mg tablet Take one tablet by mouth daily.   duloxetine DR (CYMBALTA) 60 mg capsule Take 60 mg by mouth daily.   furosemide (LASIX) 40 mg tablet Take one-half tablet by mouth every morning.   lidocaine (ASPERCREME PATCH) 4 % topical patch Apply one each topically to affected area daily.   midodrine (PROAMITINE) 10 mg tablet TAKE 1 TABLET BY MOUTH THREE TIMES A DAY   nitrofurantoin monohyd/m-cryst (MACROBID) 100 mg capsule Take one capsule by mouth every 6 hours. Take with food.  Indications: an infection of the genitals or urinary tract   ondansetron HCL (ZOFRAN) 4 mg tablet Take one tablet by mouth every 6 hours as needed for Nausea or Vomiting.   oxyCODONE (ROXICODONE) 5 mg tablet Take one tablet by mouth every 6 hours as needed   pantoprazole DR (PROTONIX) 40 mg tablet TAKE 1 TABLET BY MOUTH TWICE A DAY   polyethylene glycol 3350 (MIRALAX) 17 g packet Take one packet by mouth twice daily.   promethazine (PHENERGAN) 25 mg tablet Take 25 mg by mouth every 4 hours as needed. Just uses it with Tramadol   rifAXIMin (XIFAXAN) 550 mg tablet Take one tablet by mouth twice daily.   zinc sulfate 220 mg (50 mg elemental zinc) capsule Take one capsule by mouth daily.         Review of Systems/Medical History      Patient summary reviewed  Nursing notes reviewed  Pertinent labs reviewed    PONV Screening: Female gender and Non-smoker  No history of anesthetic complications  No family history of anesthetic complications      Airway - negative        Pulmonary           No indications/hx of pneumonia      No recent URI      Shortness of breath  No sleep apnea      Cardiovascular       Recent diagnostic studies:          echocardiogram          TTE 01/07/19:  Rest Echo:   1. Normal left ventricular systolic function. EF~ 60%  2. Right ventricular size and systolic function are normal  3. No significant valvular stenosis or regurgitation  4. Estimated peak systolic PA pressure of 30 mmHg. Normal right atrial pressure  ?  Compared to previous study on 08/28/2018, tricuspid regurgitation is less severe, PA systolic (30 VS 48) and RA pressures have improved.       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 weekly blood transfusions)      Blood dyscrasia      Obesity    Constitution - negative   Physical Exam    Airway Findings      Mallampati: II      TM distance: <3 FB      Neck ROM: full      Mouth opening: limited      Airway patency: adequate    Dental Findings:       Upper dentures and lower dentures    Cardiovascular Findings:       Rhythm: regular      Rate: normal      No murmur    Pulmonary Findings:       Breath sounds clear to auscultation.    Abdominal Findings:         Abdominal exam deferred    Neurological Findings:       Alert and oriented x 3    Constitutional findings:       No acute distress    Other Findings: Jaundiced       Diagnostic Tests  Hematology:   Lab Results   Component Value Date    HGB 8.2 08/04/2020    HCT 23.9 08/04/2020    PLTCT 242 08/04/2020    WBC 8.5 08/04/2020    NEUT 72 08/01/2020    ANC 4.95 08/01/2020    LYMPH 9.3 02/23/2020    ALC 0.62 08/01/2020    MONA 13 08/01/2020    AMC 0.92 08/01/2020    EOSA 4 08/01/2020    ABC 0.13 08/01/2020    BASOPHILS 1.2 02/23/2020    MCV 82.0  08/04/2020    MCH 28.1 08/04/2020    MCHC 34.2 08/04/2020    MPV 8.4 08/04/2020    RDW 19.0 08/04/2020         General Chemistry:   Lab Results   Component Value Date    NA 133 08/04/2020    K 4.5 08/04/2020    CL 103 08/04/2020    CO2 21 08/04/2020    GAP 9 08/04/2020    BUN 51 08/04/2020    CR 1.35 08/04/2020    GLU 131 08/04/2020    GLU 172 02/23/2020    CA 8.7 08/04/2020    ALBUMIN 3.0 08/04/2020    LACTIC 1.3 02/11/2020    MG 2.4 07/20/2020    TOTBILI 3.7 08/04/2020    PO4 3.2 10/28/2018      Coagulation:   Lab Results   Component Value Date    PT 15.8 02/23/2020    PTT 32.8 02/25/2020    INR 1.4 08/04/2020         Anesthesia Plan    ASA score: 4   Plan: MAC  Induction method: intravenous  NPO status: acceptable      Informed Consent  Anesthetic plan and risks discussed with patient.  Use of blood products discussed with patient  Blood Consent: consented      Plan discussed with: CRNA and anesthesiologist.  Comments: (Risks discussed questions answered and pt wishes to proceed )

## 2020-08-05 NOTE — Anesthesia Post-Procedure Evaluation
Post-Anesthesia Evaluation    Name: Kristin Moody      MRN: [id number]     DOB: [birth year]     Age: 64 y.o.     Sex: female   __________________________________________________________________________     Procedure Information     Anesthesia Start Date/Time: 08/05/20 1215    Procedures:       EGD (N/A )      ESOPHAGOGASTRODUODENOSCOPY WITH CONTROL OF BLEEDING - FLEXIBLE (N/A )    Location: ENDO 1 / ENDO/GI    Surgeons: Vertell Novak, MD          Post-Anesthesia Vitals  BP: 98/51 (09/17 1345)  Pulse: 87 (09/17 1345)  Respirations: 19 PER MINUTE (09/17 1345)  SpO2: 97 % (09/17 1345)  SpO2 Pulse: 87 (09/17 1345)   Vitals Value Taken Time   BP 98/51 08/05/20 1345   Temp 36.5 ?C (97.7 ?F) 08/05/20 1245   Pulse 87 08/05/20 1345   Respirations 19 PER MINUTE 08/05/20 1345   SpO2 97 % 08/05/20 1345         Post Anesthesia Evaluation Note    Evaluation location: Pre/Post  Patient participation: recovered; patient participated in evaluation  Level of consciousness: alert    Pain score: 0  Pain management: adequate    Hydration: normovolemia  Temperature: 36.0?C - 38.4?C  Airway patency: adequate    Perioperative Events       Post-op nausea and vomiting: no PONV    Postoperative Status  Cardiovascular status: hemodynamically stable  Respiratory status: spontaneous ventilation  Follow-up needed: none        Perioperative Events  Perioperative Event: No  Emergency Case Activation: No

## 2020-08-05 NOTE — Discharge Planning (AHS/AVS)
EGD/Upper EUS/ERCP/Antegrade Enteroscopy  Post Upper Endoscopy Instructions    -You may have a sore throat after the procedure for 2-3 days.  Try sucrets or lozenges to help ease the pain.  If it continues please contact us.    -If you feel feverish, have a temperature of 101 degrees or higher, persistent nausea and vomiting, abdominal pain or dark stools; please notify your nurse or GI physician.    -You may have abdominal cramping following the procedure this can be relieved by belching or passing air.    -If you have redness or swelling at the IV site, place a warm, wet washcloth over the affected areas for 15 minutes, 3-4 times a day until the redness subsides.  If symptoms continue for 2-3 days, contact your regular physician.    - If you have bleeding from your mouth, over 2 tablespoons and increasing, please notify your physician.  A small amount of bleeding is normal if a biopsy or polyps were taken.  If you are vomiting blood you need to seek immediate medical attention.    - You may resume all your routine medications, if medications need to be held your physician and/or nurse will notify you post procedure.    SPECIFIC INSTRUCTIONS    OUTPATIENTS:  A. Because of sedation and lack of coordination, FOR THE NEXT 24 HOURS, DO NOT:  1. Operate any motorized vehicle - this includes driving.  2. Sign any legal documents or conduct important business matters.  3. Use any dangerous machinery (chain saw, lawnmower, etc.).  4. Drink any alcoholic beverages.  Should you have any questions or concerns after your procedure please call [phone] M-F 8am-5:00 pm. After 5:00 pm, holidays or weekends call [phone] and ask for the GI Doctor on call.

## 2020-08-05 NOTE — H&P (View-Only)
Pre Procedure History and Physical/Sedation Plan    Name:Kristin Moody                                                                   MRN: [id number]                 DOB:21-Sep-1956          Age: 64 y.o.  Date of Service: 08/05/20    Date of Procedure:  08/05/2020    Planned Procedure(s):  GI:  EGD  Sedation/Medication Plan: MAC (Monitored Anesthesia Care)  Discussion/Reviews:  Physician has discussed risks and alternatives of this type of sedation and above planned procedures with patient  ___________________________________________________________________  Chief Complaint:  GAVE    History of Present Illness: Kristin Moody is a 64 y.o. 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    Previous Anesthetic/Sedation History:  Reviewed.    Medical History:   Diagnosis Date   ? Aneurysm (HCC)    ? 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 Diverticulosis of colon     descending and sigmoid colon   ? Dyspnea    ? Essential hypertension 08/13/2012   ? Fatty infiltration of liver    ? HTN (hypertension)    ? Obesity (BMI 30-39.9) 05/13/2011   ? Preop cardiovascular exam 01/07/2019   ? Sinus infection    ? Type 2 diabetes mellitus (HCC) 09/21/2014     Surgical History:   Procedure Laterality Date   ? HYSTERECTOMY  1994   ? UPPER GASTROINTESTINAL ENDOSCOPY  2009   ? COLONOSCOPY  2009   ? CYSTOCELE REPAIR  2009    with endocele repair   ? RECTOCELE REPAIR  03/2009   ? LIVER BIOPSY  07/17/2010   ? COLONOSCOPY N/A 11/08/2017    Performed by Dawna Part, MD at [facility] ENDO   ? ESOPHAGOGASTRODUODENOSCOPY N/A 11/08/2017    Performed by Dawna Part, MD at [facility] ENDO   ? ESOPHAGOGASTRODUODENOSCOPY WITH BIOPSY - FLEXIBLE  11/08/2017    Performed by Dawna Part, MD at [facility] ENDO   ? COLONOSCOPY WITH HOT BIOPSY FORCEPS REMOVAL TUMOR/ POLYP/ OTHER LESION  11/08/2017    Performed by Dawna Part, MD at [facility] ENDO   ? ESOPHAGOGASTRODUODENOSCOPY WITH BAND LIGATION ESOPHAGEAL/ GASTRIC VARICES - FLEXIBLE N/A 04/10/2018    Performed by Normajean Baxter, MD at [facility] ENDO   ? ESOPHAGOGASTRODUODENOSCOPY WITH BIOPSY - FLEXIBLE N/A 04/10/2018    Performed by Normajean Baxter, MD at [facility] ENDO   ? ESOPHAGOGASTRODUODENOSCOPY WITH CONTROL OF BLEEDING - FLEXIBLE N/A 04/25/2018    Performed by Jolee Ewing, MD at [facility] ENDO   ? ESOPHAGOGASTRODUODENOSCOPY WITH BIOPSY - FLEXIBLE with push enteroscopy N/A 08/28/2018    Performed by Celesta Gentile, MD at [facility] ENDO   ? EGD N/A 10/27/2018    Performed by Eliott Nine, MD at [facility] ENDO   ? ESOPHAGOGASTRODUODENOSCOPY WITH SNARE REMOVAL TUMOR/ POLYP/ OTHER LESION - FLEXIBLE N/A 10/27/2018    Performed by Eliott Nine, MD at [facility] ENDO   ? ESOPHAGOGASTRODUODENOSCOPY WITH BIOPSY - FLEXIBLE N/A 12/16/2018    Performed by Buckles, Vinnie Level, MD at [facility] ENDO   ? ESOPHAGOGASTRODUODENOSCOPY  WITH CONTROL OF BLEEDING - FLEXIBLE N/A 12/16/2018    Performed by Buckles, Vinnie Level, MD at [facility] ENDO   ? ESOPHAGOGASTRODUODENOSCOPY WITH SPECIMEN COLLECTION BY BRUSHING/ WASHING N/A 01/22/2019    Performed by Veneta Penton, MD at [facility] ENDO   ? ESOPHAGOGASTRODUODENOSCOPY WITH DILATION ESOPHAGUS WITH BALLOON 30 MM OR GREATER - FLEXIBLE N/A 01/22/2019    Performed by Veneta Penton, MD at [facility] ENDO   ? ESOPHAGOGASTRODUODENOSCOPY WITH BIOPSY - FLEXIBLE N/A 01/22/2019    Performed by Veneta Penton, MD at [facility] ENDO   ? ESOPHAGOGASTRODUODENOSCOPY WITH BIOPSY - FLEXIBLE N/A 06/19/2019    Performed by Dawna Part, MD at [facility] ENDO   ? ESOPHAGOGASTRODUODENOSCOPY [WITH APC]  WITH SPECIMEN COLLECTION BY BRUSHING/ WASHING N/A 08/07/2019    Performed by Dawna Part, MD at [facility] ENDO   ? ESOPHAGOGASTRODUODENOSCOPY WITH SPECIMEN COLLECTION BY BRUSHING/ WASHING N/A 09/10/2019    Performed by Buckles, Vinnie Level, MD at [facility] ENDO   ? ESOPHAGOGASTRODUODENOSCOPY WITH CONTROL OF BLEEDING - FLEXIBLE N/A 09/10/2019    Performed by Buckles, Vinnie Level, MD at [facility] ENDO   ? ESOPHAGOGASTRODUODENOSCOPY WITH CONTROL OF BLEEDING - FLEXIBLE N/A 02/12/2020 Performed by Lenor Derrick, MD at [facility] ENDO   ? ESOPHAGOGASTRODUODENOSCOPY WITH BIOPSY - FLEXIBLE N/A 02/26/2020    Performed by Buckles, Vinnie Level, MD at [facility] ENDO   ? ESOPHAGOGASTRODUODENOSCOPY WITH BIOPSY - FLEXIBLE N/A 05/18/2020    Performed by Lenor Derrick, MD at [facility] ENDO   ? ESOPHAGOGASTRODUODENOSCOPY WITH CONTROL OF BLEEDING - FLEXIBLE N/A 05/18/2020    Performed by Lenor Derrick, MD at [facility] ENDO   ? ESOPHAGOGASTRODUODENOSCOPY WITH CONTROL OF BLEEDING - FLEXIBLE N/A 07/08/2020    Performed by Vertell Novak, MD at [facility] ENDO   ? CHOLECYSTECTOMY     ? COLONOSCOPY     ? ESOPHAGOGASTRIC FUNDOPLICATION  2003, 2004    laparoscopic   ? ESOPHAGOGASTRIC FUNDOPLICATION     ? HX HYSTERECTOMY     ? PR ESOPHAGOSCOPY FLEXIBLE TRANSORAL DIAGNOSTIC       Pertinent medical/surgical history reviewed  Pertinent family history reviewed  Social History     Tobacco Use   ? Smoking status: Never Smoker   ? Smokeless tobacco: Never Used   Vaping Use   ? Vaping Use: Never used   Substance Use Topics   ? Alcohol use: No   ? Drug use: No     Social History     Substance and Sexual Activity   Drug Use No     Allergies:  Tegaderm, Adhesive tape (rosins), Sulfa (sulfonamide antibiotics), Codeine, Morphine, Penicillin g, and Tramadol  Medications  Current Facility-Administered Medications   Medication   ? lactated ringers infusion     Review of Systems:  All other systems reviewed and are negative.           Physical Exam:  Temp: 36.7 ?C (98.1 ?F) (09/17 1122)  Pulse: 95 (09/17 1122)  Respirations: 22 PER MINUTE (09/17 1122)  BP: 122/61 (09/17 1122)  General appearance: alert and no distress  Throat: Lips, mucosa, and tongue normal. Teeth and gums normal  Lungs: clear to auscultation bilaterally  Heart: regular rate and rhythm  Abdomen: soft, non-tender. Bowel sounds normal. No masses,  no organomegaly  Extremities: extremities normal, atraumatic, no cyanosis or edema  @  Airway:  airway assessment performed  Mallampati II (soft palate, uvula, fauces visible)  Anesthesia Classification:  Per Anesthesia  NPO Status: Acceptable  Pregnancy Status: Not Pregnant  Lab/Radiology/Other Diagnostic Tests  Labs:  Relevant labs reviewed      Starling Manns, MD  Pager

## 2020-08-06 ENCOUNTER — Encounter: Admit: 2020-08-06 | Discharge: 2020-08-06 | Payer: MEDICARE

## 2020-08-06 DIAGNOSIS — K5730 Diverticulosis of large intestine without perforation or abscess without bleeding: Secondary | ICD-10-CM

## 2020-08-06 DIAGNOSIS — K7460 Unspecified cirrhosis of liver: Secondary | ICD-10-CM

## 2020-08-06 DIAGNOSIS — R0600 Dyspnea, unspecified: Secondary | ICD-10-CM

## 2020-08-06 DIAGNOSIS — E669 Obesity, unspecified: Secondary | ICD-10-CM

## 2020-08-06 DIAGNOSIS — Z01810 Encounter for preprocedural cardiovascular examination: Secondary | ICD-10-CM

## 2020-08-06 DIAGNOSIS — K760 Fatty (change of) liver, not elsewhere classified: Secondary | ICD-10-CM

## 2020-08-06 DIAGNOSIS — I729 Aneurysm of unspecified site: Secondary | ICD-10-CM

## 2020-08-06 DIAGNOSIS — I10 Essential (primary) hypertension: Secondary | ICD-10-CM

## 2020-08-06 DIAGNOSIS — J329 Chronic sinusitis, unspecified: Secondary | ICD-10-CM

## 2020-08-06 DIAGNOSIS — E119 Type 2 diabetes mellitus without complications: Secondary | ICD-10-CM

## 2020-08-08 ENCOUNTER — Encounter: Admit: 2020-08-08 | Discharge: 2020-08-08 | Payer: MEDICARE

## 2020-08-08 ENCOUNTER — Ambulatory Visit: Admit: 2020-08-08 | Discharge: 2020-08-08 | Payer: MEDICARE

## 2020-08-08 LAB — COMPREHENSIVE METABOLIC PANEL
Lab: 1.4 mg/dL — ABNORMAL HIGH (ref 0.4–1.00)
Lab: 101 MMOL/L — ABNORMAL HIGH (ref 98–110)
Lab: 146 U/L — ABNORMAL HIGH (ref 25–110)
Lab: 166 mg/dL — ABNORMAL HIGH (ref 70–100)
Lab: 2.7 mg/dL — ABNORMAL HIGH (ref 0.3–1.2)
Lab: 21 MMOL/L (ref 21–30)
Lab: 24 U/L (ref 7–56)
Lab: 3 g/dL — ABNORMAL LOW (ref 3.5–5.0)
Lab: 37 mL/min — ABNORMAL LOW (ref >60)
Lab: 4.5 MMOL/L (ref 3.5–5.1)
Lab: 45 mL/min — ABNORMAL LOW (ref >60)
Lab: 5.6 g/dL — ABNORMAL LOW (ref 6.0–8.0)
Lab: 50 mg/dL — ABNORMAL HIGH (ref 7–25)
Lab: 59 U/L — ABNORMAL HIGH (ref 7–40)
Lab: 8.4 mg/dL — ABNORMAL LOW (ref 8.5–10.6)
Lab: 9 (ref 3–12)

## 2020-08-08 LAB — CBC AND DIFF
Lab: 19 % — ABNORMAL HIGH (ref 11–15)
Lab: 2.5 M/UL — ABNORMAL LOW (ref 4.0–5.0)
Lab: 21 % — ABNORMAL LOW (ref 36–45)
Lab: 303 K/UL — ABNORMAL LOW (ref 150–400)
Lab: 32 g/dL (ref 32.0–36.0)
Lab: 6.9 g/dL — ABNORMAL LOW (ref 12.0–15.0)
Lab: 8.4 10*3/uL (ref 4.5–11.0)
Lab: 83 FL (ref 80–100)

## 2020-08-08 LAB — PROTIME INR (PT): Lab: 1.5 pg — ABNORMAL HIGH (ref 0.8–1.2)

## 2020-08-08 NOTE — Telephone Encounter
Contacted patient regarding reports of pain to staff this AM when she came for blood draw. Patient reports ongoing lower back/abdominal pain, described as 'staying and constant,' 7/10, has tried heating pad but no pain medications, states she has some problems with Tylenol but was told previously she could take tramadol, discussed following previous recommendations by provider but limiting narcotic pain medication if possible. Patient reporting no other symptoms at this time, denies new dizziness, shortness of breath, vomiting blood, bloody/black tarry stools, fevers/chills, advised patient to monitor for signs/symptoms that would warrant visit to ER for evaluation. Discussed patient's hgb 6.9 today, previously scheduled appointment for transfusion 9/22, will contact with any updates to plan of care. Primary NC T. Zimmer notified of discussion. Patient verbalized understanding of information and agreed to plan with no further questions.

## 2020-08-09 ENCOUNTER — Encounter: Admit: 2020-08-09 | Discharge: 2020-08-09 | Payer: MEDICARE

## 2020-08-09 NOTE — Progress Notes
The specialty pharmacy is currently waiting on the following information for Kristin Moody's specialty medication, Tymlos:    Patient's financial information    The specialty pharmacy team has requested this information and until it is received the specialty pharmacy will not be able to continue the process for specialty medication access.    financial documents can be submitted by fax [phone]--email : [email or web address] or mail --    Gaston Islam  Pharmacy Patient Advocate  [phone]

## 2020-08-10 ENCOUNTER — Encounter: Admit: 2020-08-10 | Discharge: 2020-08-10 | Payer: MEDICARE

## 2020-08-10 ENCOUNTER — Ambulatory Visit: Admit: 2020-08-10 | Discharge: 2020-08-10 | Payer: MEDICARE

## 2020-08-10 DIAGNOSIS — K31819 Angiodysplasia of stomach and duodenum without bleeding: Secondary | ICD-10-CM

## 2020-08-10 DIAGNOSIS — K7460 Unspecified cirrhosis of liver: Secondary | ICD-10-CM

## 2020-08-10 DIAGNOSIS — R109 Unspecified abdominal pain: Secondary | ICD-10-CM

## 2020-08-10 DIAGNOSIS — R319 Hematuria, unspecified: Secondary | ICD-10-CM

## 2020-08-10 LAB — URINALYSIS, MICROSCOPIC

## 2020-08-10 NOTE — Progress Notes
Pt arrived to CA11 infusion room for 1 unit PRBC transfusion. VSS, PIV inserted, and assessment complete; please refer to doc flowsheet for details. Blood transfused without incident. No transfusion hypersensitivity reaction noted. VSS throughout infusions, please refer to doc flowsheet. PIV flushed and removed. All questions and concerns addressed. Pt left unit in stable condition.

## 2020-08-10 NOTE — Patient Instructions
Post Blood Transfusion Instructions    During your transfusion you were monitored by nursing staff for signs or symptoms of a transfusion reaction.    You should continue to observe for signs of a transfusion reaction for at least 24 hours after being discharged.  Look for signs of yellowing of the eyes or skin up to 1 week after discharge.    Please notify your physician immediately or go to the nearest Emergency Department if you experience anything unusual or if you have any of the following signs or symptoms:  (This is not a complete list of signs or symptoms).  Ref:  cdc.gov National Healthcare Safety Network Biovigilance Component Hemovigilance Module Surveillance Protocol.    Generalized: Respiratory Skin   Fever 100.5 or higher Cough Hives   Chills Shortness of Breath Itching   Feeling Faint or Dizzy Pain (describe type & location): Yellowing of the Eyes or skin (1 week)   Loss of consciousness Back Pain Urine:    Chest Pain Blood Urine     Dark Urine       Notify your provider or Emergency Department that you recently had a blood transfusion.

## 2020-08-11 ENCOUNTER — Encounter: Admit: 2020-08-11 | Discharge: 2020-08-11 | Payer: MEDICARE

## 2020-08-11 ENCOUNTER — Ambulatory Visit: Admit: 2020-08-11 | Discharge: 2020-08-11 | Payer: MEDICARE

## 2020-08-11 DIAGNOSIS — R945 Abnormal results of liver function studies: Secondary | ICD-10-CM

## 2020-08-11 DIAGNOSIS — D649 Anemia, unspecified: Secondary | ICD-10-CM

## 2020-08-11 DIAGNOSIS — I10 Essential (primary) hypertension: Secondary | ICD-10-CM

## 2020-08-11 DIAGNOSIS — J329 Chronic sinusitis, unspecified: Secondary | ICD-10-CM

## 2020-08-11 DIAGNOSIS — Z01810 Encounter for preprocedural cardiovascular examination: Secondary | ICD-10-CM

## 2020-08-11 DIAGNOSIS — K760 Fatty (change of) liver, not elsewhere classified: Secondary | ICD-10-CM

## 2020-08-11 DIAGNOSIS — E119 Type 2 diabetes mellitus without complications: Secondary | ICD-10-CM

## 2020-08-11 DIAGNOSIS — K7460 Unspecified cirrhosis of liver: Secondary | ICD-10-CM

## 2020-08-11 DIAGNOSIS — K7469 Other cirrhosis of liver: Secondary | ICD-10-CM

## 2020-08-11 DIAGNOSIS — D500 Iron deficiency anemia secondary to blood loss (chronic): Secondary | ICD-10-CM

## 2020-08-11 DIAGNOSIS — E669 Obesity, unspecified: Secondary | ICD-10-CM

## 2020-08-11 DIAGNOSIS — K31819 Angiodysplasia of stomach and duodenum without bleeding: Secondary | ICD-10-CM

## 2020-08-11 DIAGNOSIS — K5730 Diverticulosis of large intestine without perforation or abscess without bleeding: Secondary | ICD-10-CM

## 2020-08-11 DIAGNOSIS — I729 Aneurysm of unspecified site: Secondary | ICD-10-CM

## 2020-08-11 DIAGNOSIS — R0600 Dyspnea, unspecified: Secondary | ICD-10-CM

## 2020-08-11 LAB — COMPREHENSIVE METABOLIC PANEL
Lab: 1.6 mg/dL — ABNORMAL HIGH (ref >60)
Lab: 100 MMOL/L (ref 98–110)
Lab: 130 MMOL/L — ABNORMAL LOW (ref 137–147)
Lab: 131 mg/dL — ABNORMAL HIGH (ref 70–100)
Lab: 4.5 MMOL/L — ABNORMAL HIGH (ref 3.5–5.1)
Lab: 58 mg/dL — ABNORMAL HIGH (ref 7–25)
Lab: 8.3 mg/dL — ABNORMAL LOW (ref >60)

## 2020-08-11 LAB — CBC AND DIFF
Lab: 18 % — ABNORMAL HIGH (ref 11–15)
Lab: 2.6 M/UL — ABNORMAL LOW (ref 4.0–5.0)
Lab: 21 % — ABNORMAL LOW (ref 36–45)
Lab: 27 pg (ref 26–34)
Lab: 312 10*3/uL (ref 150–400)
Lab: 33 g/dL (ref 32.0–36.0)
Lab: 7.2 g/dL — ABNORMAL LOW (ref 12.0–15.0)
Lab: 80 % — ABNORMAL HIGH (ref 41–77)
Lab: 82 FL (ref 80–100)
Lab: 9.9 10*3/uL (ref 4.5–11.0)

## 2020-08-11 LAB — PROTIME INR (PT): Lab: 1.6 mg/dL — ABNORMAL HIGH (ref <100)

## 2020-08-11 LAB — GRAM STAIN

## 2020-08-11 MED ORDER — ALBUMIN, HUMAN 25 % IV SOLP
0 refills | Status: CP
Start: 2020-08-11 — End: ?
  Administered 2020-08-11 (×3): 12.5 g via INTRAVENOUS

## 2020-08-11 NOTE — Patient Instructions
IR-PARACENTESIS   A paracentesis is the removal of an abnormal buildup of fluid in your abdominal cavity. This fluid buildup is called ascites and may be caused by conditions such as liver disease, heart failure, or cancer. During this procedure, a needle is inserted into your abdomen to drain the fluid. The fluid may then be sent to the lab for testing if medically indicated. Removal of the fluid may also relieve belly pressure and shortness of breath caused by the ascites.?  POST-PROCEDURE PAIN:   ? Pain control following your procedure is a priority for both you and your Physicians.  ? Some soreness or tenderness at the site is to be expected for several days. We recommend taking over the counter analgesics to help relieve this pain.  ? Alternative methods for pain relief include but not limited to heat or cold compress, relaxation techniques, rest, and changing of positions.  ? If pain continues after 5-7 days or you have severe pain not relieved by medication, please contact us as directed below.?  POST-PROCEDURE ACTIVITY:   ? A responsible adult must drive you home.  ? If you receive sedation, narcotic pain medication or anesthesia for the procedure, you should not drive or operate heavy machinery or do anything that requires concentration for at least 24 hours after procedure completion.  ? It is recommended that a responsible adult be with you until morning.?  POST-PROCEDURE SITE CARE:   ? You will have a small bandage over the procedure site. Keep this dry.  ? You may remove it in 24 hours.  ? You may shower in 24 hours, after removing the bandage.  ? A dry gauze bandage may be reapplied as necessary to protect your clothing as the site may sometimes leak for several days after the procedure.  ? Do not submerge the procedure site for 1 week (no bathtub, swimming, hot tub, etc.)  ? Do not use ointments, creams or powders on the puncture site.  ? Be sure your hands are clean when touching near the site.?  DIET/MEDICATIONS:   ? You may resume your previous diet after the procedure.  ? If you receive sedation or narcotic pain medications, avoid any foods or beverages containing alcohol for at least 24 hours after the procedure.  ? Please see the Medication Reconciliation sheet for instructions regarding resuming your home medications.?  CALL THE DOCTOR IF:   ? Bright red blood soaks the bandage.  ? You have pain not relieved by medication. Some soreness at the site is to be expected.  ? You have signs of infection such as: fever greater than 101F, chills, redness, warmth, swelling, drainage or pus from the puncture site.  For any of the above symptoms or for problems or concerns related to the procedure performed at the [facility], call [phone] Monday-Friday from 7-5p. After-hours and weekends, please call [phone] and ask for the Interventional Radiology Resident on-call.   You or your caregiver should call 911 for any severe symptoms such as excessive bleeding, severe dizziness, trouble breathing or loss of consciousness.   ?  ?

## 2020-08-11 NOTE — Telephone Encounter
Spoke with pt regarding her MELD score and that she is due for waitlist labs by next Thursday.  Advised pt to come to [place] on Tuesday for labs. Pt agreed with plan.

## 2020-08-11 NOTE — Other
Immediate Post Procedure Note    Date:  08/11/2020                                         Attending Physician:   Felipa Furnace  Performing Provider:  Ellsworth Lennox, DO    Consent:  Consent obtained from patient.  Time out performed: Consent obtained, correct patient verified, correct procedure verified, correct site verified, patient marked as necessary.  Pre/Post Procedure Diagnosis:  ascites  Indications:  ascites      Procedure(s):  US guided paracentesis  See radiology report  Findings:  Successful paracentesis; ascites     Estimated Blood Loss:  None/Negligible  Specimen(s) Removed/Disposition:  Yes, sent to pathology  Complications: None  Patient Tolerated Procedure: Well  Post-Procedure Condition:  stable    Ellsworth Lennox, DO

## 2020-08-11 NOTE — Progress Notes
D/c paperwork reviewed with pt. Education regarding activity restrictions, S/SX of infection, S/SX to report and site care addressed. Pt verbalizes understanding.     PIV removed without any complications.    Pt transferred off unit via WC with belongings.

## 2020-08-11 NOTE — H&P (View-Only)
IR Pre-Procedure History and Physical/Sedation Plan    Procedure Date: 08/11/2020     Planned Procedure(s):  Paracentesis     Procedural code status: Prior    Indication:  Ascites  __________________________________________________________________    Chief Complaint:  Ascites    History of Present Illness: Kristin Moody is a 64 y.o. female with a history as listed below who presents today for procedure.    Patient Active Problem List    Diagnosis Date Noted   ? Pleurodynia 06/04/2020   ? Pneumothorax 06/04/2020   ? 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 Hypotension 02/10/2020   ? 'Light-for-dates' infant with signs of fetal malnutrition 01/22/2020   ? 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 Anemia 09/28/2019   ? Encephalopathy 08/12/2019   ? Spasticity 08/10/2019   ? Hyperreflexia 08/10/2019   ? 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 Right leg weakness 05/14/2019   ? 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 Iron (Fe) deficiency anemia 01/19/2019   ? Preop cardiovascular exam 01/07/2019   ? Pre-transplant evaluation for liver transplant 01/06/2019   ? Cirrhosis (HCC) 12/16/2018   ? Acute on chronic anemia 12/16/2018   ? Lactic acidosis 12/16/2018   ? GI bleed 10/24/2018   ? Portal hypertension (HCC) 08/28/2018   ? Confusion 08/27/2018   ? Dyspnea 08/27/2018   ? Chronic abdominal pain 08/27/2018   ? 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 Acute on chronic blood loss anemia 08/27/2018   ? Iron deficiency anemia 04/13/2018   ? Pneumonia due to infectious organism 04/13/2018   ? Melena 04/10/2018   ? Esophageal varices without bleeding (HCC) 02/25/2018   ? 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 Lower abdominal pain 10/24/2014   ? Chest discomfort 09/21/2014   ? Essential hypertension 08/13/2012   ? Abnormal liver function tests 05/13/2011   ? Fatty liver disease, nonalcoholic 05/13/2011   ? Obesity (BMI 30-39.9) 05/13/2011 ? Slow transit constipation 05/13/2011     Medical History:   Diagnosis Date   ? Aneurysm (HCC)    ? 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 Diverticulosis of colon     descending and sigmoid colon   ? Dyspnea    ? Essential hypertension 08/13/2012   ? Fatty infiltration of liver    ? HTN (hypertension)    ? Obesity (BMI 30-39.9) 05/13/2011   ? Preop cardiovascular exam 01/07/2019   ? Sinus infection    ? Type 2 diabetes mellitus (HCC) 09/21/2014      Surgical History:   Procedure Laterality Date   ? HYSTERECTOMY  1994   ? UPPER GASTROINTESTINAL ENDOSCOPY  2009   ? COLONOSCOPY  2009   ? CYSTOCELE REPAIR  2009    with endocele repair   ? RECTOCELE REPAIR  03/2009   ? LIVER BIOPSY  07/17/2010   ? COLONOSCOPY N/A 11/08/2017    Performed by Dawna Part, MD at [facility] ENDO   ? ESOPHAGOGASTRODUODENOSCOPY N/A 11/08/2017    Performed by Dawna Part, MD at [facility] ENDO   ? ESOPHAGOGASTRODUODENOSCOPY WITH BIOPSY - FLEXIBLE  11/08/2017    Performed by Dawna Part, MD at [facility] ENDO   ? COLONOSCOPY WITH HOT BIOPSY FORCEPS REMOVAL TUMOR/ POLYP/ OTHER LESION  11/08/2017    Performed by Dawna Part, MD at [facility] ENDO   ? ESOPHAGOGASTRODUODENOSCOPY WITH BAND LIGATION ESOPHAGEAL/  GASTRIC VARICES - FLEXIBLE N/A 04/10/2018    Performed by Normajean Baxter, MD at [facility] ENDO   ? ESOPHAGOGASTRODUODENOSCOPY WITH BIOPSY - FLEXIBLE N/A 04/10/2018    Performed by Normajean Baxter, MD at [facility] ENDO   ? ESOPHAGOGASTRODUODENOSCOPY WITH CONTROL OF BLEEDING - FLEXIBLE N/A 04/25/2018    Performed by Jolee Ewing, MD at [facility] ENDO   ? ESOPHAGOGASTRODUODENOSCOPY WITH BIOPSY - FLEXIBLE with push enteroscopy N/A 08/28/2018    Performed by Celesta Gentile, MD at [facility] ENDO   ? EGD N/A 10/27/2018    Performed by Eliott Nine, MD at [facility] ENDO   ? ESOPHAGOGASTRODUODENOSCOPY WITH SNARE REMOVAL TUMOR/ POLYP/ OTHER LESION - FLEXIBLE N/A 10/27/2018    Performed by Eliott Nine, MD at [facility] ENDO   ? ESOPHAGOGASTRODUODENOSCOPY WITH BIOPSY - FLEXIBLE N/A 12/16/2018 Performed by Buckles, Vinnie Level, MD at [facility] ENDO   ? ESOPHAGOGASTRODUODENOSCOPY WITH CONTROL OF BLEEDING - FLEXIBLE N/A 12/16/2018    Performed by Buckles, Vinnie Level, MD at [facility] ENDO   ? ESOPHAGOGASTRODUODENOSCOPY WITH SPECIMEN COLLECTION BY BRUSHING/ WASHING N/A 01/22/2019    Performed by Veneta Penton, MD at [facility] ENDO   ? ESOPHAGOGASTRODUODENOSCOPY WITH DILATION ESOPHAGUS WITH BALLOON 30 MM OR GREATER - FLEXIBLE N/A 01/22/2019    Performed by Veneta Penton, MD at [facility] ENDO   ? ESOPHAGOGASTRODUODENOSCOPY WITH BIOPSY - FLEXIBLE N/A 01/22/2019    Performed by Veneta Penton, MD at [facility] ENDO   ? ESOPHAGOGASTRODUODENOSCOPY WITH BIOPSY - FLEXIBLE N/A 06/19/2019    Performed by Dawna Part, MD at [facility] ENDO   ? ESOPHAGOGASTRODUODENOSCOPY [WITH APC]  WITH SPECIMEN COLLECTION BY BRUSHING/ WASHING N/A 08/07/2019    Performed by Dawna Part, MD at [facility] ENDO   ? ESOPHAGOGASTRODUODENOSCOPY WITH SPECIMEN COLLECTION BY BRUSHING/ WASHING N/A 09/10/2019    Performed by Buckles, Vinnie Level, MD at [facility] ENDO   ? ESOPHAGOGASTRODUODENOSCOPY WITH CONTROL OF BLEEDING - FLEXIBLE N/A 09/10/2019    Performed by Buckles, Vinnie Level, MD at [facility] ENDO   ? ESOPHAGOGASTRODUODENOSCOPY WITH CONTROL OF BLEEDING - FLEXIBLE N/A 02/12/2020    Performed by Lenor Derrick, MD at [facility] ENDO   ? ESOPHAGOGASTRODUODENOSCOPY WITH BIOPSY - FLEXIBLE N/A 02/26/2020    Performed by Buckles, Vinnie Level, MD at [facility] ENDO   ? ESOPHAGOGASTRODUODENOSCOPY WITH BIOPSY - FLEXIBLE N/A 05/18/2020    Performed by Lenor Derrick, MD at [facility] ENDO   ? ESOPHAGOGASTRODUODENOSCOPY WITH CONTROL OF BLEEDING - FLEXIBLE N/A 05/18/2020    Performed by Lenor Derrick, MD at [facility] ENDO   ? ESOPHAGOGASTRODUODENOSCOPY WITH CONTROL OF BLEEDING - FLEXIBLE N/A 07/08/2020    Performed by Vertell Novak, MD at [facility] ENDO   ? EGD N/A 08/05/2020    Performed by Vertell Novak, MD at [facility] ENDO   ? ESOPHAGOGASTRODUODENOSCOPY WITH CONTROL OF BLEEDING - FLEXIBLE N/A 08/05/2020    Performed by Vertell Novak, MD at [facility] ENDO   ? CHOLECYSTECTOMY     ? COLONOSCOPY     ? ESOPHAGOGASTRIC FUNDOPLICATION  2003, 2004    laparoscopic   ? ESOPHAGOGASTRIC FUNDOPLICATION     ? HX HYSTERECTOMY     ? PR ESOPHAGOSCOPY FLEXIBLE TRANSORAL DIAGNOSTIC        Social History     Tobacco Use   ? Smoking status: Never Smoker   ? Smokeless tobacco: Never Used   Substance Use Topics   ? Alcohol use: No      Family History   Problem Relation Age of Onset   ? Other Mother    ?  Kidney Failure Mother    ? Osteoporosis Mother    ? Cancer Father         esophageal   ? Liver Disease Sister         endstage, s/p liver transplant   ? Cancer Brother         tonsil, liver    ? Hip Fracture Neg Hx       Medications Prior to Admission   Medication Sig Dispense Refill Last Dose   ? abaloparatide (TYMLOS) 80 mcg (3,120 mcg/1.56 mL) injection pen Inject eighty mcg under the skin daily. Administer into the periumbilical region of the abdomen while sitting or lying down in case of orthostatic hypotension. 3.12 mL 3    ? ciprofloxacin (CIPRO) 500 mg tablet Take one tablet by mouth daily. 30 tablet 1    ? duloxetine DR (CYMBALTA) 60 mg capsule Take 60 mg by mouth daily.      ? furosemide (LASIX) 40 mg tablet Take one-half tablet by mouth every morning. 90 tablet 0    ? lidocaine (ASPERCREME PATCH) 4 % topical patch Apply one each topically to affected area daily. 5 each 1    ? midodrine (PROAMITINE) 10 mg tablet TAKE 1.5 TABLETS BY MOUTH THREE TIMES DAILY. 405 tablet 1    ? nitrofurantoin monohyd/m-cryst (MACROBID) 100 mg capsule Take one capsule by mouth every 6 hours. Take with food.  Indications: an infection of the genitals or urinary tract 20 capsule 0    ? ondansetron HCL (ZOFRAN) 4 mg tablet Take one tablet by mouth every 6 hours as needed for Nausea or Vomiting. 120 tablet 1    ? oxyCODONE (ROXICODONE) 5 mg tablet Take one tablet by mouth every 6 hours as needed 20 tablet 0    ? pantoprazole DR (PROTONIX) 40 mg tablet TAKE 1 TABLET BY MOUTH TWICE A DAY 180 tablet 1    ? polyethylene glycol 3350 (MIRALAX) 17 g packet Take one packet by mouth twice daily.      ? promethazine (PHENERGAN) 25 mg tablet Take 25 mg by mouth every 4 hours as needed. Just uses it with Tramadol      ? rifAXIMin (XIFAXAN) 550 mg tablet Take one tablet by mouth twice daily. 180 tablet 3    ? zinc sulfate 220 mg (50 mg elemental zinc) capsule Take one capsule by mouth daily. 90 capsule 3      Allergies   Allergen Reactions   ? Tegaderm BLISTERS   ? Adhesive Tape (Rosins) RASH     Other reaction(s): irritated skin   ? Sulfa (Sulfonamide Antibiotics) RASH     Other reaction(s): Unknown Reaction   ? Codeine NAUSEA AND VOMITING   ? Morphine SEE COMMENTS     Pt reports burns her veins  Other reaction(s): Unknown Reaction   ? Penicillin G SEE COMMENTS     Throat issues, blisters in throat    ? Tramadol NAUSEA ONLY and ITCHING     Takes this medication regularly        Review of Systems  Gastrointestinal: positive for nausea    Previous Personal Anesthetic/Sedation History:  Denies adverse events related to sedation/anesthesia.     Previous Family Anesthetic/Sedation History: Denies adverse events related to sedation/anesthesia.    Physical Exam:  Vital Signs: Last Filed In 24 Hours Vital Signs: 24 Hour Range   BP: 116/41 (09/22 1206)  Temp: 37.2 ?C (98.9 ?F) (09/22 1206)  Pulse: 97 (09/22 1206)  Respirations: 20 PER  MINUTE (09/22 1206)  SpO2: 100 % (09/22 1206) BP: (113-120)/(41-51)   Temp:  [36.7 ?C (98.1 ?F)-37.2 ?C (98.9 ?F)]   Pulse:  [93-97]   Respirations:  [18 PER MINUTE-20 PER MINUTE]   SpO2:  [99 %-100 %]           General appearance: Alert and no distress noted.  Neurologic: Grossly normal.  Lungs: Non labored at rest.  Heart: Regular rate and rhythm  Abdomen: Distended    Airway: airway assessment performed  Mallampati II (soft palate, uvula, fauces visible)  Head and Neck: no abnormalities noted  Mouth: no abnormalities noted  NPO status: Acceptable  Pregnancy Status: Not Pregnant  Anesthesia Classification:  ASA III (A patient with a severe systemic disease that limits activity, but is not incapacitating)  Pre-operative anxiolysis Plan: N/A  Sedation/Medication Plan: Lidocaine  Discussion/Reviews:  Physician has discussed risks and alternatives of this type of sedation and above planned procedures with patient    Lab/Radiology/Other Diagnostic Tests:  Labs:  Pertinent labs reviewed           Pollyann Kennedy, APRN-NP  Pager [phone]

## 2020-08-11 NOTE — Progress Notes
Pt to IR [facility] for preprocedure work up.     Connected to bedside monitor, VSS on RA.     Pt updated on procedure and sedation POC, all questions answered at this time.   Cart locked in lowest position and call light in reach.

## 2020-08-12 ENCOUNTER — Encounter: Admit: 2020-08-12 | Discharge: 2020-08-12 | Payer: MEDICARE

## 2020-08-13 ENCOUNTER — Encounter: Admit: 2020-08-13 | Discharge: 2020-08-13 | Payer: MEDICARE

## 2020-08-13 ENCOUNTER — Ambulatory Visit: Admit: 2020-08-13 | Discharge: 2020-08-13 | Payer: MEDICARE

## 2020-08-13 DIAGNOSIS — R319 Hematuria, unspecified: Secondary | ICD-10-CM

## 2020-08-13 DIAGNOSIS — K31819 Angiodysplasia of stomach and duodenum without bleeding: Secondary | ICD-10-CM

## 2020-08-13 DIAGNOSIS — K7460 Unspecified cirrhosis of liver: Secondary | ICD-10-CM

## 2020-08-13 DIAGNOSIS — R109 Unspecified abdominal pain: Secondary | ICD-10-CM

## 2020-08-13 LAB — POC CREATININE, RAD: Lab: 1.5 mg/dL — ABNORMAL HIGH (ref 0.4–1.00)

## 2020-08-13 MED ORDER — SODIUM CHLORIDE 0.9 % IJ SOLN
50 mL | Freq: Once | INTRAVENOUS | 0 refills | Status: CP
Start: 2020-08-13 — End: ?

## 2020-08-13 MED ORDER — IOHEXOL 350 MG IODINE/ML IV SOLN
80 mL | Freq: Once | INTRAVENOUS | 0 refills | Status: CP
Start: 2020-08-13 — End: ?

## 2020-08-15 ENCOUNTER — Encounter: Admit: 2020-08-15 | Discharge: 2020-08-15

## 2020-08-15 NOTE — Telephone Encounter
08/15/20 Verified active MDCR & BCBS secondary coverage via [employer]. No Authorizations needed. Patient remains financially clear for transplant

## 2020-08-16 ENCOUNTER — Encounter: Admit: 2020-08-16 | Discharge: 2020-08-16

## 2020-08-16 ENCOUNTER — Ambulatory Visit: Admit: 2020-08-16 | Discharge: 2020-08-16 | Payer: MEDICARE

## 2020-08-16 DIAGNOSIS — K7469 Other cirrhosis of liver: Secondary | ICD-10-CM

## 2020-08-16 DIAGNOSIS — E669 Obesity, unspecified: Secondary | ICD-10-CM

## 2020-08-16 DIAGNOSIS — K760 Fatty (change of) liver, not elsewhere classified: Secondary | ICD-10-CM

## 2020-08-16 DIAGNOSIS — K31819 Angiodysplasia of stomach and duodenum without bleeding: Secondary | ICD-10-CM

## 2020-08-16 DIAGNOSIS — D649 Anemia, unspecified: Secondary | ICD-10-CM

## 2020-08-16 DIAGNOSIS — R945 Abnormal results of liver function studies: Secondary | ICD-10-CM

## 2020-08-16 DIAGNOSIS — D500 Iron deficiency anemia secondary to blood loss (chronic): Secondary | ICD-10-CM

## 2020-08-16 LAB — COMPREHENSIVE METABOLIC PANEL
Lab: 1.6 mg/dL — ABNORMAL HIGH (ref 0.4–1.00)
Lab: 101 MMOL/L (ref 98–110)
Lab: 130 MMOL/L — ABNORMAL LOW (ref 137–147)
Lab: 2 mg/dL — ABNORMAL HIGH (ref 0.3–1.2)
Lab: 2.8 g/dL — ABNORMAL LOW (ref 3.5–5.0)
Lab: 21 MMOL/L — ABNORMAL LOW (ref 21–30)
Lab: 28 U/L — ABNORMAL HIGH (ref 7–56)
Lab: 31 mL/min — ABNORMAL LOW (ref >60)
Lab: 37 mL/min — ABNORMAL LOW (ref >60)
Lab: 5.7 g/dL — ABNORMAL LOW (ref 6.0–8.0)
Lab: 52 mg/dL — ABNORMAL HIGH (ref 7–25)
Lab: 66 U/L — ABNORMAL HIGH (ref 7–40)

## 2020-08-16 LAB — CBC AND DIFF
Lab: 0 K/UL (ref 0–0.45)
Lab: 1 % — ABNORMAL HIGH (ref 0–2)
Lab: 17 % — ABNORMAL LOW (ref 36–45)
Lab: 19 % — ABNORMAL HIGH (ref 11–15)
Lab: 2.2 M/UL — ABNORMAL LOW (ref 4.0–5.0)
Lab: 26 pg (ref 26–34)
Lab: 7.6 10*3/uL (ref 4.5–11.0)
Lab: 77 % — ABNORMAL LOW (ref 41–77)

## 2020-08-16 LAB — PROTIME INR (PT): Lab: 1.6 g/dL — ABNORMAL HIGH (ref 0.8–1.2)

## 2020-08-16 NOTE — Telephone Encounter
RN spoke with the lab, patient's hemoglobin low at 5.9. Patient with known history of PHG with frequent transfusions.  Patient has appointment for 2 units of RBCs tomorrow on CA11.    Routed to primary nurse coordinator for review.

## 2020-08-16 NOTE — Telephone Encounter
late entry: notified by Lorra Hals. at 1307 of critical result.   patient scheduled for 2 units PRBC 08/17/2020, standing appt. previously placed. Patient notified of results.

## 2020-08-17 ENCOUNTER — Encounter: Admit: 2020-08-17 | Discharge: 2020-08-17

## 2020-08-17 ENCOUNTER — Ambulatory Visit: Admit: 2020-08-17 | Discharge: 2020-08-17 | Payer: MEDICARE

## 2020-08-17 DIAGNOSIS — D649 Anemia, unspecified: Secondary | ICD-10-CM

## 2020-08-17 DIAGNOSIS — E669 Obesity, unspecified: Secondary | ICD-10-CM

## 2020-08-17 NOTE — Progress Notes
Pt. arrived to CA11 infusion room for 2 uRBCs transfusion. VSS, PIV accessed and assessment complete; please refer to doc flowsheet for details. Blood transfused without incident. No transfusion hypersensitivity reaction noted. VSS throughout infusions, please refer to doc flowsheet. PIV removed per protocol. All questions and concerns addressed. Copy of AVS declined. Pt. left unit with husband in stable condition.

## 2020-08-18 ENCOUNTER — Encounter: Admit: 2020-08-18 | Discharge: 2020-08-18 | Payer: MEDICARE

## 2020-08-18 ENCOUNTER — Ambulatory Visit: Admit: 2020-08-18 | Discharge: 2020-08-18 | Payer: MEDICARE

## 2020-08-18 ENCOUNTER — Encounter: Admit: 2020-08-18 | Discharge: 2020-08-18 | Payer: Medicare Other

## 2020-08-18 DIAGNOSIS — K31819 Angiodysplasia of stomach and duodenum without bleeding: Secondary | ICD-10-CM

## 2020-08-18 DIAGNOSIS — D649 Anemia, unspecified: Secondary | ICD-10-CM

## 2020-08-18 DIAGNOSIS — K7469 Other cirrhosis of liver: Secondary | ICD-10-CM

## 2020-08-18 DIAGNOSIS — R945 Abnormal results of liver function studies: Secondary | ICD-10-CM

## 2020-08-18 DIAGNOSIS — K760 Fatty (change of) liver, not elsewhere classified: Principal | ICD-10-CM

## 2020-08-18 DIAGNOSIS — D500 Iron deficiency anemia secondary to blood loss (chronic): Secondary | ICD-10-CM

## 2020-08-18 LAB — COMPREHENSIVE METABOLIC PANEL
Lab: 1.4 mg/dL — ABNORMAL HIGH (ref 0.4–1.00)
Lab: 102 MMOL/L (ref 98–110)
Lab: 117 mg/dL — ABNORMAL HIGH (ref 70–100)
Lab: 131 MMOL/L — ABNORMAL LOW (ref 137–147)
Lab: 139 U/L — ABNORMAL HIGH (ref 25–110)
Lab: 19 MMOL/L — ABNORMAL LOW (ref 21–30)
Lab: 2.7 g/dL — ABNORMAL LOW (ref 3.5–5.0)
Lab: 24 U/L (ref 7–56)
Lab: 3 mg/dL — ABNORMAL HIGH (ref 0.3–1.2)
Lab: 37 mL/min — ABNORMAL LOW (ref >60)
Lab: 4.5 MMOL/L (ref 3.5–5.1)
Lab: 44 mL/min — ABNORMAL LOW (ref >60)
Lab: 45 mg/dL — ABNORMAL HIGH (ref 7–25)
Lab: 5.3 g/dL — ABNORMAL LOW (ref 6.0–8.0)
Lab: 57 U/L — ABNORMAL HIGH (ref 7–40)
Lab: 8 mg/dL — ABNORMAL LOW (ref 8.5–10.6)
Lab: 10 (ref 3–12)

## 2020-08-18 LAB — GRAM STAIN

## 2020-08-18 LAB — CBC AND DIFF
Lab: 18 % — ABNORMAL HIGH (ref 11–15)
Lab: 2.8 M/UL — ABNORMAL LOW (ref 4.0–5.0)
Lab: 245 10*3/uL (ref 150–400)
Lab: 27 pg (ref 26–34)
Lab: 33 g/dL (ref 32.0–36.0)
Lab: 7.7 g/dL — ABNORMAL LOW (ref 12.0–15.0)
Lab: 79 % — ABNORMAL HIGH (ref 41–77)
Lab: 8.1 FL (ref 7–11)
Lab: 8.4 10*3/uL (ref 4.5–11.0)
Lab: 80 FL (ref 80–100)

## 2020-08-18 LAB — PROTIME INR (PT): Lab: 1.6 % — ABNORMAL HIGH (ref 0.8–1.2)

## 2020-08-18 MED ORDER — ALBUMIN, HUMAN 25 % IV SOLP
0 refills | Status: CP
Start: 2020-08-18 — End: ?
  Administered 2020-08-18 (×3): 12.5 g via INTRAVENOUS

## 2020-08-18 NOTE — H&P (View-Only)
Pre Procedure History and Physical/Sedation Plan    Procedure Date: 08/18/2020     Planned Procedure(s):  U/S guided diagnostic/therapeutic paracentesis  Indication: Ascites  __________________________________________________________________    Chief Complaint: Ascites    History of Present Illness: Kristin Moody is a 64 y.o. female. Pt presents to IR for paracentesis. Pt complains of abdominal distention.  Patient Active Problem List    Diagnosis Date Noted   ? Pleurodynia 06/04/2020   ? Pneumothorax 06/04/2020   ? 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 Hypotension 02/10/2020   ? 'Light-for-dates' infant with signs of fetal malnutrition 01/22/2020   ? 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 Anemia 09/28/2019   ? Encephalopathy 08/12/2019   ? Spasticity 08/10/2019   ? Hyperreflexia 08/10/2019   ? 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 Right leg weakness 05/14/2019   ? 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 Iron (Fe) deficiency anemia 01/19/2019   ? Preop cardiovascular exam 01/07/2019   ? Pre-transplant evaluation for liver transplant 01/06/2019   ? Cirrhosis (HCC) 12/16/2018   ? Acute on chronic anemia 12/16/2018   ? Lactic acidosis 12/16/2018   ? GI bleed 10/24/2018   ? Portal hypertension (HCC) 08/28/2018   ? Confusion 08/27/2018   ? Dyspnea 08/27/2018   ? Chronic abdominal pain 08/27/2018   ? 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 Acute on chronic blood loss anemia 08/27/2018   ? Iron deficiency anemia 04/13/2018   ? Pneumonia due to infectious organism 04/13/2018   ? Melena 04/10/2018   ? Esophageal varices without bleeding (HCC) 02/25/2018   ? 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 Lower abdominal pain 10/24/2014   ? Chest discomfort 09/21/2014   ? Essential hypertension 08/13/2012   ? Abnormal liver function tests 05/13/2011   ? Fatty liver disease, nonalcoholic 05/13/2011   ? Obesity (BMI 30-39.9) 05/13/2011 ? Slow transit constipation 05/13/2011     Medical History:   Diagnosis Date   ? Aneurysm (HCC)    ? 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 Diverticulosis of colon     descending and sigmoid colon   ? Dyspnea    ? Essential hypertension 08/13/2012   ? Fatty infiltration of liver    ? HTN (hypertension)    ? Obesity (BMI 30-39.9) 05/13/2011   ? Preop cardiovascular exam 01/07/2019   ? Sinus infection    ? Type 2 diabetes mellitus (HCC) 09/21/2014      Surgical History:   Procedure Laterality Date   ? HYSTERECTOMY  1994   ? UPPER GASTROINTESTINAL ENDOSCOPY  2009   ? COLONOSCOPY  2009   ? CYSTOCELE REPAIR  2009    with endocele repair   ? RECTOCELE REPAIR  03/2009   ? LIVER BIOPSY  07/17/2010   ? COLONOSCOPY N/A 11/08/2017    Performed by Dawna Part, MD at [facility] ENDO   ? ESOPHAGOGASTRODUODENOSCOPY N/A 11/08/2017    Performed by Dawna Part, MD at [facility] ENDO   ? ESOPHAGOGASTRODUODENOSCOPY WITH BIOPSY - FLEXIBLE  11/08/2017    Performed by Dawna Part, MD at [facility] ENDO   ? COLONOSCOPY WITH HOT BIOPSY FORCEPS REMOVAL TUMOR/ POLYP/ OTHER LESION  11/08/2017    Performed by Dawna Part, MD at [facility] ENDO   ? ESOPHAGOGASTRODUODENOSCOPY WITH BAND LIGATION ESOPHAGEAL/ GASTRIC VARICES - FLEXIBLE N/A 04/10/2018    Performed by  Normajean Baxter, MD at [facility] ENDO   ? ESOPHAGOGASTRODUODENOSCOPY WITH BIOPSY - FLEXIBLE N/A 04/10/2018    Performed by Normajean Baxter, MD at [facility] ENDO   ? ESOPHAGOGASTRODUODENOSCOPY WITH CONTROL OF BLEEDING - FLEXIBLE N/A 04/25/2018    Performed by Jolee Ewing, MD at [facility] ENDO   ? ESOPHAGOGASTRODUODENOSCOPY WITH BIOPSY - FLEXIBLE with push enteroscopy N/A 08/28/2018    Performed by Celesta Gentile, MD at [facility] ENDO   ? EGD N/A 10/27/2018    Performed by Eliott Nine, MD at [facility] ENDO   ? ESOPHAGOGASTRODUODENOSCOPY WITH SNARE REMOVAL TUMOR/ POLYP/ OTHER LESION - FLEXIBLE N/A 10/27/2018    Performed by Eliott Nine, MD at [facility] ENDO   ? ESOPHAGOGASTRODUODENOSCOPY WITH BIOPSY - FLEXIBLE N/A 12/16/2018 Performed by Buckles, Vinnie Level, MD at [facility] ENDO   ? ESOPHAGOGASTRODUODENOSCOPY WITH CONTROL OF BLEEDING - FLEXIBLE N/A 12/16/2018    Performed by Buckles, Vinnie Level, MD at [facility] ENDO   ? ESOPHAGOGASTRODUODENOSCOPY WITH SPECIMEN COLLECTION BY BRUSHING/ WASHING N/A 01/22/2019    Performed by Veneta Penton, MD at [facility] ENDO   ? ESOPHAGOGASTRODUODENOSCOPY WITH DILATION ESOPHAGUS WITH BALLOON 30 MM OR GREATER - FLEXIBLE N/A 01/22/2019    Performed by Veneta Penton, MD at [facility] ENDO   ? ESOPHAGOGASTRODUODENOSCOPY WITH BIOPSY - FLEXIBLE N/A 01/22/2019    Performed by Veneta Penton, MD at [facility] ENDO   ? ESOPHAGOGASTRODUODENOSCOPY WITH BIOPSY - FLEXIBLE N/A 06/19/2019    Performed by Dawna Part, MD at [facility] ENDO   ? ESOPHAGOGASTRODUODENOSCOPY [WITH APC]  WITH SPECIMEN COLLECTION BY BRUSHING/ WASHING N/A 08/07/2019    Performed by Dawna Part, MD at [facility] ENDO   ? ESOPHAGOGASTRODUODENOSCOPY WITH SPECIMEN COLLECTION BY BRUSHING/ WASHING N/A 09/10/2019    Performed by Buckles, Vinnie Level, MD at [facility] ENDO   ? ESOPHAGOGASTRODUODENOSCOPY WITH CONTROL OF BLEEDING - FLEXIBLE N/A 09/10/2019    Performed by Buckles, Vinnie Level, MD at [facility] ENDO   ? ESOPHAGOGASTRODUODENOSCOPY WITH CONTROL OF BLEEDING - FLEXIBLE N/A 02/12/2020    Performed by Lenor Derrick, MD at [facility] ENDO   ? ESOPHAGOGASTRODUODENOSCOPY WITH BIOPSY - FLEXIBLE N/A 02/26/2020    Performed by Buckles, Vinnie Level, MD at [facility] ENDO   ? ESOPHAGOGASTRODUODENOSCOPY WITH BIOPSY - FLEXIBLE N/A 05/18/2020    Performed by Lenor Derrick, MD at [facility] ENDO   ? ESOPHAGOGASTRODUODENOSCOPY WITH CONTROL OF BLEEDING - FLEXIBLE N/A 05/18/2020    Performed by Lenor Derrick, MD at [facility] ENDO   ? ESOPHAGOGASTRODUODENOSCOPY WITH CONTROL OF BLEEDING - FLEXIBLE N/A 07/08/2020    Performed by Vertell Novak, MD at [facility] ENDO   ? EGD N/A 08/05/2020    Performed by Vertell Novak, MD at [facility] ENDO   ? ESOPHAGOGASTRODUODENOSCOPY WITH CONTROL OF BLEEDING - FLEXIBLE N/A 08/05/2020    Performed by Vertell Novak, MD at [facility] ENDO   ? CHOLECYSTECTOMY     ? COLONOSCOPY     ? ESOPHAGOGASTRIC FUNDOPLICATION  2003, 2004    laparoscopic   ? ESOPHAGOGASTRIC FUNDOPLICATION     ? HX HYSTERECTOMY     ? PR ESOPHAGOSCOPY FLEXIBLE TRANSORAL DIAGNOSTIC        Medications Prior to Admission   Medication Sig Dispense Refill Last Dose   ? abaloparatide (TYMLOS) 80 mcg (3,120 mcg/1.56 mL) injection pen Inject eighty mcg under the skin daily. Administer into the periumbilical region of the abdomen while sitting or lying down in case of orthostatic hypotension. 3.12 mL 3 Unknown   ? ciprofloxacin (CIPRO) 500 mg tablet Take one  tablet by mouth daily. 30 tablet 1 08/17/2020   ? duloxetine DR (CYMBALTA) 60 mg capsule Take 60 mg by mouth daily.   08/18/2020   ? furosemide (LASIX) 40 mg tablet Take one-half tablet by mouth every morning. 90 tablet 0 08/18/2020   ? lidocaine (ASPERCREME PATCH) 4 % topical patch Apply one each topically to affected area daily. 5 each 1 Unknown   ? midodrine (PROAMITINE) 10 mg tablet TAKE 1.5 TABLETS BY MOUTH THREE TIMES DAILY. 405 tablet 1 08/18/2020   ? ondansetron HCL (ZOFRAN) 4 mg tablet Take one tablet by mouth every 6 hours as needed for Nausea or Vomiting. 120 tablet 1 Unknown   ? oxyCODONE (ROXICODONE) 5 mg tablet Take one tablet by mouth every 6 hours as needed 20 tablet 0 Unknown   ? pantoprazole DR (PROTONIX) 40 mg tablet TAKE 1 TABLET BY MOUTH TWICE A DAY 180 tablet 1 08/18/2020   ? polyethylene glycol 3350 (MIRALAX) 17 g packet Take one packet by mouth twice daily.   Unknown   ? promethazine (PHENERGAN) 25 mg tablet Take 25 mg by mouth every 4 hours as needed. Just uses it with Tramadol   08/18/2020   ? rifAXIMin (XIFAXAN) 550 mg tablet Take one tablet by mouth twice daily. 180 tablet 3 08/18/2020   ? zinc sulfate 220 mg (50 mg elemental zinc) capsule Take one capsule by mouth daily. 90 capsule 3 Unknown     Allergies   Allergen Reactions   ? Tegaderm BLISTERS   ? Adhesive Tape (Rosins) RASH     Other reaction(s): irritated skin   ? Sulfa (Sulfonamide Antibiotics) RASH     Other reaction(s): Unknown Reaction   ? Codeine NAUSEA AND VOMITING   ? Morphine SEE COMMENTS     Pt reports burns her veins  Other reaction(s): Unknown Reaction   ? Penicillin G SEE COMMENTS     Throat issues, blisters in throat    ? Tramadol NAUSEA ONLY and ITCHING     Takes this medication regularly        Social History:   Social History     Tobacco Use   ? Smoking status: Never Smoker   ? Smokeless tobacco: Never Used   Substance Use Topics   ? Alcohol use: No      Family History   Problem Relation Age of Onset   ? Other Mother    ? Kidney Failure Mother    ? Osteoporosis Mother    ? Cancer Father         esophageal   ? Liver Disease Sister         endstage, s/p liver transplant   ? Cancer Brother         tonsil, liver    ? Hip Fracture Neg Hx         Review of Systems  Positive abdominal distention    Previous Anesthetic/Sedation History:  N/A-procedure done with local anesthetic    Physical Exam:  Vital Signs: Last Filed In 24 Hours Vital Signs: 24 Hour Range   BP: 120/73 (09/30 0921)  Temp: 36.6 ?C (97.9 ?F) (09/30 1610)  Pulse: 91 (09/30 0921)  Respirations: 30 PER MINUTE (09/30 0921)  SpO2: 98 % (09/30 0921)  SpO2 Pulse: 91 (09/30 0921) BP: (104-120)/(54-73)   Temp:  [36.6 ?C (97.8 ?F)-36.9 ?C (98.4 ?F)]   Pulse:  [87-94]   Respirations:  [16 PER MINUTE-30 PER MINUTE]   SpO2:  [98 %-100 %]  Intensity Pain Scale (Self Report): 6 (08/18/20 0921)        General appearance: alert  Neurologic: Grossly normal  Lungs: Nonlabored  Abdomen: abnormal findings:  distended       Anesthesia Classification:  ASA III (A patient with a severe systemic disease that limits activity, but is not incapacitating)  Sedation/Medication Plan: Local anesthetic  Medications for Reversal: N/A-local anesthetic  Discussion/Reviews:  Physician has discussed risks and alternatives of this type of sedation and above planned procedures with patient  NPO Status: N/A Local anesthetic        Lab/Radiology/Other Diagnostic Tests:  Labs:  Pertinent labs reviewed           Almira Coaster, APRN-NP  Pager [phone]

## 2020-08-18 NOTE — Progress Notes
Kristin Moody discharged on 08/18/2020.   Marland Kitchen  Discharge instructions reviewed with patient.  Valuables returned:   Where Are Valuables Stored?: IR bay 6.  Home medications:    .  Functional assessment at discharge complete: Yes .      AVS printed and given to patient. Pt declined going over DC materials. VS stable. Procedure site is clean, dry, and intact. PIV taken out. Pt wheeled off unit and DC with husband.

## 2020-08-18 NOTE — Procedures
Immediate Post Procedure Note    Date:  08/18/2020                                           Attending Physician:   Dr Felipa Furnace  Assistant(s):  Dr. Dewain Penning    Procedure(s):  Paracentesis - diagnostic and therapeutic  Pre/Post Procedure Diagnosis:  Ascites    Consent:  Consent was obtained from the patient.   Time out performed: Consent confirmed, correct patient verified, correct procedure verified, correct site verified, patient marked as necessary, pre-procedural labs reviewed.  Anesthesia: Local 10 cc of subcutaneous 2% Lidocaine solution    Findings:  Clear straw-colored fluid aspirated. Please see separately dictated radiology report for full procedure details.    Estimated Blood Loss: None/ Negligible   Specimen(s) Removed/Disposition:  Yes, Sent to Lab for analysis.   Complications:  None   Comments: Patient tolerated the procedure well.     Kristin Piccolo, MD

## 2020-08-18 NOTE — Patient Instructions
IR-PARACENTESIS   A paracentesis is the removal of an abnormal buildup of fluid in your abdominal cavity. This fluid buildup is called ascites and may be caused by conditions such as liver disease, heart failure, or cancer. During this procedure, a needle is inserted into your abdomen to drain the fluid. The fluid may then be sent to the lab for testing if medically indicated. Removal of the fluid may also relieve belly pressure and shortness of breath caused by the ascites.?  POST-PROCEDURE PAIN:   ? Pain control following your procedure is a priority for both you and your Physicians.  ? Some soreness or tenderness at the site is to be expected for several days. We recommend taking over the counter analgesics to help relieve this pain.  ? Alternative methods for pain relief include but not limited to heat or cold compress, relaxation techniques, rest, and changing of positions.  ? If pain continues after 5-7 days or you have severe pain not relieved by medication, please contact us as directed below.?  POST-PROCEDURE ACTIVITY:   ? A responsible adult must drive you home.  ? If you receive sedation, narcotic pain medication or anesthesia for the procedure, you should not drive or operate heavy machinery or do anything that requires concentration for at least 24 hours after procedure completion.  ? It is recommended that a responsible adult be with you until morning.?  POST-PROCEDURE SITE CARE:   ? You will have a small bandage over the procedure site. Keep this dry.  ? You may remove it in 24 hours.  ? You may shower in 24 hours, after removing the bandage.  ? A dry gauze bandage may be reapplied as necessary to protect your clothing as the site may sometimes leak for several days after the procedure.  ? Do not submerge the procedure site for 1 week (no bathtub, swimming, hot tub, etc.)  ? Do not use ointments, creams or powders on the puncture site.  ? Be sure your hands are clean when touching near the site.?  DIET/MEDICATIONS:   ? You may resume your previous diet after the procedure.  ? If you receive sedation or narcotic pain medications, avoid any foods or beverages containing alcohol for at least 24 hours after the procedure.  ? Please see the Medication Reconciliation sheet for instructions regarding resuming your home medications.?  CALL THE DOCTOR IF:   ? Bright red blood soaks the bandage.  ? You have pain not relieved by medication. Some soreness at the site is to be expected.  ? You have signs of infection such as: fever greater than 101F, chills, redness, warmth, swelling, drainage or pus from the puncture site.  For any of the above symptoms or for problems or concerns related to the procedure performed at the [facility], call [phone] Monday-Friday from 7-5p. After-hours and weekends, please call [phone] and ask for the Interventional Radiology Resident on-call.   You or your caregiver should call 911 for any severe symptoms such as excessive bleeding, severe dizziness, trouble breathing or loss of consciousness.   ?  ?

## 2020-08-18 NOTE — Progress Notes
due for recertification on the transplant waitlist.   updated MELD in [facility] with labs from today  new MELD is 24  Due again for recert on 09/17/2020

## 2020-08-22 ENCOUNTER — Encounter: Admit: 2020-08-22 | Discharge: 2020-08-22 | Payer: MEDICARE

## 2020-08-22 DIAGNOSIS — K760 Fatty (change of) liver, not elsewhere classified: Secondary | ICD-10-CM

## 2020-08-22 NOTE — Progress Notes
The medication assistance application for [employer] specialty medication Tymlos has been submitted to [employer].  A decision from the drug company/manufacturer may take up to 30 business days.  The specialty team will continue to follow.    Gaston Islam  Pharmacy Patient Advocate - Specialty Pharmacy  [phone]

## 2020-08-22 NOTE — Telephone Encounter
patient present in clinic this morning for labs, reports a fall at home. Patient was standing in the kitchen, and turned around to walk where she tripped over her dog and fell to the floor. Visible arm wound with dressing, advised patient to call PCP or go to urgent care for evaluation of this. Patient has knotting/bruising on right side of forehead. She states she hit her head on the ground. Assessment complete, patient denies s/s blurred vision, headache, slurred speech, or pain. Advised patient will discuss with provider team for further recommendations.     Patient CBC today is 6.8, scheduled blood transfusion (2 units ) on Wednesday. Iron panel added to SIL for processing.  Will order iron infusion pending lab results review

## 2020-08-22 NOTE — Progress Notes
The specialty pharmacy second attempt, waiting on the following information for [facility], nurse to complete and return patient Kristin Moody  application 1 page form, Tymlos    Provider's signature. I will follow up with the providers office in 2 days.       Kelly [facility]  Pharmacy Patient Advocate  [phone]

## 2020-08-22 NOTE — Telephone Encounter
called patient, advised that head imaging not recommended at this time, per discussion with Coralee North, APRN. Patient again advised to have evaluation of arm with PCP, she v/u.

## 2020-08-23 ENCOUNTER — Encounter: Admit: 2020-08-23 | Discharge: 2020-08-23 | Payer: MEDICARE

## 2020-08-23 DIAGNOSIS — R945 Abnormal results of liver function studies: Secondary | ICD-10-CM

## 2020-08-23 DIAGNOSIS — K31819 Angiodysplasia of stomach and duodenum without bleeding: Secondary | ICD-10-CM

## 2020-08-23 DIAGNOSIS — D649 Anemia, unspecified: Secondary | ICD-10-CM

## 2020-08-23 DIAGNOSIS — K7460 Unspecified cirrhosis of liver: Secondary | ICD-10-CM

## 2020-08-24 ENCOUNTER — Encounter: Admit: 2020-08-24 | Discharge: 2020-08-24 | Payer: MEDICARE

## 2020-08-24 ENCOUNTER — Ambulatory Visit: Admit: 2020-08-24 | Discharge: 2020-08-24 | Payer: MEDICARE

## 2020-08-24 DIAGNOSIS — K7460 Unspecified cirrhosis of liver: Secondary | ICD-10-CM

## 2020-08-24 DIAGNOSIS — K31819 Angiodysplasia of stomach and duodenum without bleeding: Secondary | ICD-10-CM

## 2020-08-24 NOTE — Progress Notes
The medication assistance application for Tymlos has been denied for [employer] due to the following:          The patient and provider have been notified that [employer] does not qualify for medication assistance at this time.  The current copay for 1 month is $563.00  The Clinical Pharmacist and Laqueta Linden have been emailed of the denial .    Kristin Moody  Pharmacy Patient Advocate  [phone]

## 2020-08-24 NOTE — Progress Notes
Pt. arrived to [facility] infusion room for 2U PRBC transfusion. VSS, PIV placed and positive for blood return, and assessment complete; please refer to doc flowsheet for details. Blood transfused without incident. No transfusion hypersensitivity reaction noted. VSS throughout infusions, please refer to doc flowsheet. PIV positive for blood return, flushed with saline, and removed per protocol. All questions and concerns addressed. Copy of AVS declined. Pt. left unit with spouse via wheelchair in stable condition.

## 2020-08-25 ENCOUNTER — Encounter: Admit: 2020-08-25 | Discharge: 2020-08-25 | Payer: MEDICARE

## 2020-08-25 ENCOUNTER — Ambulatory Visit: Admit: 2020-08-25 | Discharge: 2020-08-25 | Payer: MEDICARE

## 2020-08-25 DIAGNOSIS — K31819 Angiodysplasia of stomach and duodenum without bleeding: Secondary | ICD-10-CM

## 2020-08-25 DIAGNOSIS — K7460 Unspecified cirrhosis of liver: Secondary | ICD-10-CM

## 2020-08-25 DIAGNOSIS — K760 Fatty (change of) liver, not elsewhere classified: Secondary | ICD-10-CM

## 2020-08-25 DIAGNOSIS — K7469 Other cirrhosis of liver: Secondary | ICD-10-CM

## 2020-08-25 DIAGNOSIS — R188 Other ascites: Secondary | ICD-10-CM

## 2020-08-25 DIAGNOSIS — D500 Iron deficiency anemia secondary to blood loss (chronic): Secondary | ICD-10-CM

## 2020-08-25 LAB — COMPREHENSIVE METABOLIC PANEL
Lab: 1.4 mg/dL — ABNORMAL HIGH (ref 0.4–1.00)
Lab: 104 MMOL/L (ref 98–110)
Lab: 111 mg/dL — ABNORMAL HIGH (ref 70–100)
Lab: 133 MMOL/L — ABNORMAL LOW (ref 137–147)
Lab: 150 U/L — ABNORMAL HIGH (ref 25–110)
Lab: 19 U/L (ref 7–56)
Lab: 2.8 mg/dL — ABNORMAL HIGH (ref 0.3–1.2)
Lab: 22 MMOL/L (ref 21–30)
Lab: 3.4 g/dL — ABNORMAL LOW (ref 3.5–5.0)
Lab: 37 mL/min — ABNORMAL LOW (ref >60)
Lab: 4.4 MMOL/L (ref 3.5–5.1)
Lab: 42 mg/dL — ABNORMAL HIGH (ref 7–25)
Lab: 44 mL/min — ABNORMAL LOW (ref >60)
Lab: 46 U/L — ABNORMAL HIGH (ref 7–40)
Lab: 5.8 g/dL — ABNORMAL LOW (ref 6.0–8.0)
Lab: 8.6 mg/dL (ref 8.5–10.6)
Lab: 7 (ref 3–12)

## 2020-08-25 LAB — CBC AND DIFF
Lab: 0 10*3/uL (ref 0–0.20)
Lab: 18 % — ABNORMAL HIGH (ref 11–15)
Lab: 2.9 M/UL — ABNORMAL LOW (ref 4.0–5.0)
Lab: 23 % — ABNORMAL LOW (ref >60)
Lab: 27 pg (ref 26–34)
Lab: 33 g/dL — ABNORMAL LOW (ref 32.0–36.0)
Lab: 7.9 g/dL — ABNORMAL LOW (ref 12.0–15.0)
Lab: 8.2 10*3/uL — ABNORMAL HIGH (ref 4.5–11.0)
Lab: 81 FL (ref >60)

## 2020-08-25 LAB — PROTIME INR (PT): Lab: 1.8 — ABNORMAL HIGH (ref 0.8–1.2)

## 2020-08-25 MED ORDER — ALBUMIN, HUMAN 25 % IV SOLP
0 refills | Status: CP
Start: 2020-08-25 — End: ?
  Administered 2020-08-25: 16:00:00 25 g via INTRAVENOUS
  Administered 2020-08-25: 15:00:00 12.5 g via INTRAVENOUS

## 2020-08-25 NOTE — H&P (View-Only)
Pre Procedure History and Physical/Sedation Plan-OP    Procedure Date: 08/25/2020     Planned Procedure(s):  US guided paracentesis     Indication for exam:  Ascites  __________________________________________________________________    Chief Complaint:  See above     History of Present Illness: Kristin Moody is a 64 y.o. female that presents to IR today for paracentesis. Patient reports abdominal pain and dyspnea.     Patient Active Problem List    Diagnosis Date Noted   ? Pleurodynia 06/04/2020   ? Pneumothorax 06/04/2020   ? 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 Hypotension 02/10/2020   ? 'Light-for-dates' infant with signs of fetal malnutrition 01/22/2020   ? 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 Anemia 09/28/2019   ? Encephalopathy 08/12/2019   ? Spasticity 08/10/2019   ? Hyperreflexia 08/10/2019   ? 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 Right leg weakness 05/14/2019   ? 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 Iron (Fe) deficiency anemia 01/19/2019   ? Preop cardiovascular exam 01/07/2019   ? Pre-transplant evaluation for liver transplant 01/06/2019   ? Cirrhosis (HCC) 12/16/2018   ? Acute on chronic anemia 12/16/2018   ? Lactic acidosis 12/16/2018   ? GI bleed 10/24/2018   ? Portal hypertension (HCC) 08/28/2018   ? Confusion 08/27/2018   ? Dyspnea 08/27/2018   ? Chronic abdominal pain 08/27/2018   ? 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 Acute on chronic blood loss anemia 08/27/2018   ? Iron deficiency anemia 04/13/2018   ? Pneumonia due to infectious organism 04/13/2018   ? Melena 04/10/2018   ? Esophageal varices without bleeding (HCC) 02/25/2018   ? 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 Lower abdominal pain 10/24/2014   ? Chest discomfort 09/21/2014   ? Essential hypertension 08/13/2012   ? Abnormal liver function tests 05/13/2011   ? Fatty liver disease, nonalcoholic 05/13/2011   ? Obesity (BMI 30-39.9) 05/13/2011   ? Slow transit constipation 05/13/2011     Medical History:   Diagnosis Date   ? Aneurysm (HCC)    ? 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 Diverticulosis of colon     descending and sigmoid colon   ? Dyspnea    ? Essential hypertension 08/13/2012   ? Fatty infiltration of liver    ? HTN (hypertension)    ? Obesity (BMI 30-39.9) 05/13/2011   ? Preop cardiovascular exam 01/07/2019   ? Sinus infection    ? Type 2 diabetes mellitus (HCC) 09/21/2014      Surgical History:   Procedure Laterality Date   ? HYSTERECTOMY  1994   ? UPPER GASTROINTESTINAL ENDOSCOPY  2009   ? COLONOSCOPY  2009   ? CYSTOCELE REPAIR  2009    with endocele repair   ? RECTOCELE REPAIR  03/2009   ? LIVER BIOPSY  07/17/2010   ? COLONOSCOPY N/A 11/08/2017    Performed by Dawna Part, MD at [facility] ENDO   ? ESOPHAGOGASTRODUODENOSCOPY N/A 11/08/2017    Performed by Dawna Part, MD at [facility] ENDO   ? ESOPHAGOGASTRODUODENOSCOPY WITH BIOPSY - FLEXIBLE  11/08/2017    Performed by Dawna Part, MD at [facility] ENDO   ? COLONOSCOPY WITH HOT BIOPSY FORCEPS REMOVAL TUMOR/ POLYP/ OTHER LESION  11/08/2017    Performed by Dawna Part, MD at [facility] ENDO   ? ESOPHAGOGASTRODUODENOSCOPY  WITH BAND LIGATION ESOPHAGEAL/ GASTRIC VARICES - FLEXIBLE N/A 04/10/2018    Performed by Normajean Baxter, MD at [facility] ENDO   ? ESOPHAGOGASTRODUODENOSCOPY WITH BIOPSY - FLEXIBLE N/A 04/10/2018    Performed by Normajean Baxter, MD at [facility] ENDO   ? ESOPHAGOGASTRODUODENOSCOPY WITH CONTROL OF BLEEDING - FLEXIBLE N/A 04/25/2018    Performed by Jolee Ewing, MD at [facility] ENDO   ? ESOPHAGOGASTRODUODENOSCOPY WITH BIOPSY - FLEXIBLE with push enteroscopy N/A 08/28/2018    Performed by Celesta Gentile, MD at [facility] ENDO   ? EGD N/A 10/27/2018    Performed by Eliott Nine, MD at [facility] ENDO   ? ESOPHAGOGASTRODUODENOSCOPY WITH SNARE REMOVAL TUMOR/ POLYP/ OTHER LESION - FLEXIBLE N/A 10/27/2018    Performed by Eliott Nine, MD at [facility] ENDO   ? ESOPHAGOGASTRODUODENOSCOPY WITH BIOPSY - FLEXIBLE N/A 12/16/2018    Performed by Buckles, Vinnie Level, MD at [facility] ENDO   ? ESOPHAGOGASTRODUODENOSCOPY WITH CONTROL OF BLEEDING - FLEXIBLE N/A 12/16/2018    Performed by Buckles, Vinnie Level, MD at [facility] ENDO   ? ESOPHAGOGASTRODUODENOSCOPY WITH SPECIMEN COLLECTION BY BRUSHING/ WASHING N/A 01/22/2019    Performed by Veneta Penton, MD at [facility] ENDO   ? ESOPHAGOGASTRODUODENOSCOPY WITH DILATION ESOPHAGUS WITH BALLOON 30 MM OR GREATER - FLEXIBLE N/A 01/22/2019    Performed by Veneta Penton, MD at [facility] ENDO   ? ESOPHAGOGASTRODUODENOSCOPY WITH BIOPSY - FLEXIBLE N/A 01/22/2019    Performed by Veneta Penton, MD at [facility] ENDO   ? ESOPHAGOGASTRODUODENOSCOPY WITH BIOPSY - FLEXIBLE N/A 06/19/2019    Performed by Dawna Part, MD at [facility] ENDO   ? ESOPHAGOGASTRODUODENOSCOPY [WITH APC]  WITH SPECIMEN COLLECTION BY BRUSHING/ WASHING N/A 08/07/2019    Performed by Dawna Part, MD at [facility] ENDO   ? ESOPHAGOGASTRODUODENOSCOPY WITH SPECIMEN COLLECTION BY BRUSHING/ WASHING N/A 09/10/2019    Performed by Buckles, Vinnie Level, MD at [facility] ENDO   ? ESOPHAGOGASTRODUODENOSCOPY WITH CONTROL OF BLEEDING - FLEXIBLE N/A 09/10/2019    Performed by Buckles, Vinnie Level, MD at [facility] ENDO   ? ESOPHAGOGASTRODUODENOSCOPY WITH CONTROL OF BLEEDING - FLEXIBLE N/A 02/12/2020    Performed by Lenor Derrick, MD at [facility] ENDO   ? ESOPHAGOGASTRODUODENOSCOPY WITH BIOPSY - FLEXIBLE N/A 02/26/2020    Performed by Buckles, Vinnie Level, MD at [facility] ENDO   ? ESOPHAGOGASTRODUODENOSCOPY WITH BIOPSY - FLEXIBLE N/A 05/18/2020    Performed by Lenor Derrick, MD at [facility] ENDO   ? ESOPHAGOGASTRODUODENOSCOPY WITH CONTROL OF BLEEDING - FLEXIBLE N/A 05/18/2020    Performed by Lenor Derrick, MD at [facility] ENDO   ? ESOPHAGOGASTRODUODENOSCOPY WITH CONTROL OF BLEEDING - FLEXIBLE N/A 07/08/2020    Performed by Vertell Novak, MD at [facility] ENDO   ? EGD N/A 08/05/2020    Performed by Vertell Novak, MD at [facility] ENDO   ? ESOPHAGOGASTRODUODENOSCOPY WITH CONTROL OF BLEEDING - FLEXIBLE N/A 08/05/2020    Performed by Vertell Novak, MD at [facility] ENDO   ? CHOLECYSTECTOMY     ? COLONOSCOPY     ? ESOPHAGOGASTRIC FUNDOPLICATION  2003, 2004    laparoscopic   ? ESOPHAGOGASTRIC FUNDOPLICATION     ? HX HYSTERECTOMY     ? PR ESOPHAGOSCOPY FLEXIBLE TRANSORAL DIAGNOSTIC        Medications Prior to Admission   Medication Sig Dispense Refill Last Dose   ? abaloparatide (TYMLOS) 80 mcg (3,120 mcg/1.56 mL) injection pen Inject eighty mcg under the skin daily. Administer into the periumbilical region of the abdomen while sitting or lying down in  case of orthostatic hypotension. 3.12 mL 3 08/24/2020   ? ciprofloxacin (CIPRO) 500 mg tablet Take one tablet by mouth daily. 30 tablet 1 08/24/2020   ? duloxetine DR (CYMBALTA) 60 mg capsule Take 60 mg by mouth daily.   08/25/2020   ? furosemide (LASIX) 40 mg tablet Take one-half tablet by mouth every morning. 90 tablet 0 08/25/2020   ? lidocaine (ASPERCREME PATCH) 4 % topical patch Apply one each topically to affected area daily. 5 each 1 08/24/2020   ? midodrine (PROAMITINE) 10 mg tablet TAKE 1.5 TABLETS BY MOUTH THREE TIMES DAILY. 405 tablet 1 08/25/2020   ? ondansetron HCL (ZOFRAN) 4 mg tablet Take one tablet by mouth every 6 hours as needed for Nausea or Vomiting. 120 tablet 1 08/25/2020   ? oxyCODONE (ROXICODONE) 5 mg tablet Take one tablet by mouth every 6 hours as needed 20 tablet 0 Past Week   ? pantoprazole DR (PROTONIX) 40 mg tablet TAKE 1 TABLET BY MOUTH TWICE A DAY 180 tablet 1 08/25/2020   ? polyethylene glycol 3350 (MIRALAX) 17 g packet Take one packet by mouth twice daily.   08/24/2020   ? promethazine (PHENERGAN) 25 mg tablet Take 25 mg by mouth every 4 hours as needed. Just uses it with Tramadol   08/25/2020   ? rifAXIMin (XIFAXAN) 550 mg tablet Take one tablet by mouth twice daily. 180 tablet 3 08/25/2020   ? zinc sulfate 220 mg (50 mg elemental zinc) capsule Take one capsule by mouth daily. 90 capsule 3 08/24/2020     Allergies   Allergen Reactions   ? Tegaderm BLISTERS   ? Adhesive Tape (Rosins) RASH     Other reaction(s): irritated skin   ? Sulfa (Sulfonamide Antibiotics) RASH     Other reaction(s): Unknown Reaction   ? Codeine NAUSEA AND VOMITING   ? Morphine SEE COMMENTS     Pt reports burns her veins  Other reaction(s): Unknown Reaction   ? Penicillin G SEE COMMENTS     Throat issues, blisters in throat    ? Tramadol NAUSEA ONLY and ITCHING     Takes this medication regularly        Social History:   Social History     Tobacco Use   ? Smoking status: Never Smoker   ? Smokeless tobacco: Never Used   Substance Use Topics   ? Alcohol use: No      Family History   Problem Relation Age of Onset   ? Other Mother    ? Kidney Failure Mother    ? Osteoporosis Mother    ? Cancer Father         esophageal   ? Liver Disease Sister         endstage, s/p liver transplant   ? Cancer Brother         tonsil, liver    ? Hip Fracture Neg Hx         Review of Systems  Constitutional: negative for fevers and chills  Respiratory: negative for cough or dyspnea  Gastrointestinal: positive for abdominal distention    Previous Anesthetic/Sedation History:  NA.     Code Status: Prior    Physical Exam:  Vital Signs: Last Filed In 24 Hours Vital Signs: 24 Hour Range   BP: 117/74 (10/07 0957)  Temp: 36.6 ?C (97.9 ?F) (10/07 0957)  Pulse: 93 (10/07 0957)  Respirations: 24 PER MINUTE (10/07 0957)  SpO2: 99 % (10/07 0957)  SpO2 Pulse: 92 (10/07  0957)  Height: 161.3 cm (63.5) (10/07 0957) BP: (98-117)/(45-74)   Temp:  [36.4 ?C (97.5 ?F)-37.1 ?C (98.7 ?F)]   Pulse:  [89-97]   Respirations:  [16 PER MINUTE-24 PER MINUTE]   SpO2:  [97 %-99 %]    Intensity Pain Scale (Self Report): 6 (08/25/20 0957)        General appearance: alert and no distress  Neurologic: Grossly normal, at baseline  Lungs: Nonlabored with normal effort  Abdomen: soft, non-tender; distended.       Sedation/Medication Plan: Lidocaine  Discussion/Reviews:  Physician has discussed risks and alternatives of this type of sedation and above planned procedures with patient  NPO Status: Acceptable  Pregnancy Status: N/A        Lab/Radiology/Other Diagnostic Tests:  Labs:  Pertinent labs reviewed           Velora Mediate, APRN-NP  Pager [phone]

## 2020-08-25 NOTE — Patient Instructions
IR-PARACENTESIS   A paracentesis is the removal of an abnormal buildup of fluid in your abdominal cavity. This fluid buildup is called ascites and may be caused by conditions such as liver disease, heart failure, or cancer. During this procedure, a needle is inserted into your abdomen to drain the fluid. The fluid may then be sent to the lab for testing if medically indicated. Removal of the fluid may also relieve belly pressure and shortness of breath caused by the ascites.?  POST-PROCEDURE PAIN:   ? Pain control following your procedure is a priority for both you and your Physicians.  ? Some soreness or tenderness at the site is to be expected for several days. We recommend taking over the counter analgesics to help relieve this pain.  ? Alternative methods for pain relief include but not limited to heat or cold compress, relaxation techniques, rest, and changing of positions.  ? If pain continues after 5-7 days or you have severe pain not relieved by medication, please contact us as directed below.?  POST-PROCEDURE ACTIVITY:   ? A responsible adult must drive you home.  ? If you receive sedation, narcotic pain medication or anesthesia for the procedure, you should not drive or operate heavy machinery or do anything that requires concentration for at least 24 hours after procedure completion.  ? It is recommended that a responsible adult be with you until morning.?  POST-PROCEDURE SITE CARE:   ? You will have a small bandage over the procedure site. Keep this dry.  ? You may remove it in 24 hours.  ? You may shower in 24 hours, after removing the bandage.  ? A dry gauze bandage may be reapplied as necessary to protect your clothing as the site may sometimes leak for several days after the procedure.  ? Do not submerge the procedure site for 1 week (no bathtub, swimming, hot tub, etc.)  ? Do not use ointments, creams or powders on the puncture site.  ? Be sure your hands are clean when touching near the site.?  DIET/MEDICATIONS:   ? You may resume your previous diet after the procedure.  ? If you receive sedation or narcotic pain medications, avoid any foods or beverages containing alcohol for at least 24 hours after the procedure.  ? Please see the Medication Reconciliation sheet for instructions regarding resuming your home medications.?  CALL THE DOCTOR IF:   ? Bright red blood soaks the bandage.  ? You have pain not relieved by medication. Some soreness at the site is to be expected.  ? You have signs of infection such as: fever greater than 101F, chills, redness, warmth, swelling, drainage or pus from the puncture site.  For any of the above symptoms or for problems or concerns related to the procedure performed at the [facility], call [phone] Monday-Friday from 7-5p. After-hours and weekends, please call [phone] and ask for the Interventional Radiology Resident on-call.   You or your caregiver should call 911 for any severe symptoms such as excessive bleeding, severe dizziness, trouble breathing or loss of consciousness.   ?  ?

## 2020-08-25 NOTE — Procedures
Immediate Post Procedure Note    Date:  08/25/2020                                           Attending Physician:   Dr Leia Alf  Assistant(s):  Dr. Myna Bright    Procedure(s):  Diagnostic paracentesis  Pre/Post Procedure Diagnosis:  Ascites    Consent:  Consent was obtained from the patient.   Time out performed: Consent confirmed, correct patient verified, correct procedure verified, correct site verified, patient marked as necessary, pre-procedural labs reviewed.  Anesthesia: Local 10 cc of subcutaneous 2% Lidocaine solution    Findings:  Clear straw-colored fluid aspirated. Please see separately dictated radiology report for full procedure details.    Estimated Blood Loss: None/ Negligible   Specimen(s) Removed/Disposition:  Yes, Sent to Lab for analysis.   Complications:  None   Comments: Patient tolerated the procedure well.     Christene Lye, MD

## 2020-08-25 NOTE — Progress Notes
Pt recovery complete.     Ambulatory status: Ambulatory.   Vitals stable. Dressing is clean, dry, and intact.  Pain level returned to baseline.  Discharge instructions reviewed with: patient  AVS signed and provided to patient.    Pt discharged to main lobby via wheelchair transport.

## 2020-08-26 LAB — GRAM STAIN

## 2020-08-29 ENCOUNTER — Encounter: Admit: 2020-08-29 | Discharge: 2020-08-29 | Payer: MEDICARE

## 2020-08-29 ENCOUNTER — Ambulatory Visit: Admit: 2020-08-29 | Discharge: 2020-08-29 | Payer: MEDICARE

## 2020-08-29 DIAGNOSIS — R945 Abnormal results of liver function studies: Secondary | ICD-10-CM

## 2020-08-29 DIAGNOSIS — K7469 Other cirrhosis of liver: Secondary | ICD-10-CM

## 2020-08-29 DIAGNOSIS — K760 Fatty (change of) liver, not elsewhere classified: Secondary | ICD-10-CM

## 2020-08-29 DIAGNOSIS — K31819 Angiodysplasia of stomach and duodenum without bleeding: Secondary | ICD-10-CM

## 2020-08-29 DIAGNOSIS — D500 Iron deficiency anemia secondary to blood loss (chronic): Secondary | ICD-10-CM

## 2020-08-29 DIAGNOSIS — D649 Anemia, unspecified: Secondary | ICD-10-CM

## 2020-08-29 DIAGNOSIS — K7460 Unspecified cirrhosis of liver: Secondary | ICD-10-CM

## 2020-08-29 LAB — COMPREHENSIVE METABOLIC PANEL
Lab: 1.5 mg/dL — ABNORMAL HIGH (ref 0.4–1.00)
Lab: 115 mg/dL — ABNORMAL HIGH (ref 70–100)
Lab: 128 U/L — ABNORMAL HIGH (ref 25–110)
Lab: 134 MMOL/L — ABNORMAL LOW (ref 137–147)
Lab: 2.2 mg/dL — ABNORMAL HIGH (ref 0.3–1.2)
Lab: 2.8 g/dL — ABNORMAL LOW (ref 3.5–5.0)
Lab: 21 MMOL/L (ref 21–30)
Lab: 21 U/L (ref 7–56)
Lab: 35 mL/min — ABNORMAL LOW (ref >60)
Lab: 39 mg/dL — ABNORMAL HIGH (ref 7–25)
Lab: 4.3 MMOL/L — ABNORMAL LOW (ref 3.5–5.1)
Lab: 42 mL/min — ABNORMAL LOW (ref >60)
Lab: 5.6 g/dL — ABNORMAL LOW (ref 6.0–8.0)
Lab: 56 U/L — ABNORMAL HIGH (ref 7–40)
Lab: 8 K/UL — ABNORMAL LOW (ref 3–12)
Lab: 8.5 mg/dL (ref 8.5–10.6)

## 2020-08-29 LAB — CBC AND DIFF
Lab: 0 10*3/uL (ref 0–0.20)
Lab: 2.9 M/UL — ABNORMAL LOW (ref 4.0–5.0)
Lab: 6 K/UL (ref 4.5–11.0)

## 2020-08-29 LAB — PROTIME INR (PT): Lab: 1.6 MMOL/L — ABNORMAL HIGH (ref 0.8–1.2)

## 2020-08-31 ENCOUNTER — Encounter: Admit: 2020-08-31 | Discharge: 2020-08-31 | Payer: MEDICARE

## 2020-08-31 ENCOUNTER — Ambulatory Visit: Admit: 2020-08-31 | Discharge: 2020-08-31 | Payer: MEDICARE

## 2020-08-31 DIAGNOSIS — K7460 Unspecified cirrhosis of liver: Secondary | ICD-10-CM

## 2020-08-31 DIAGNOSIS — K31819 Angiodysplasia of stomach and duodenum without bleeding: Secondary | ICD-10-CM

## 2020-08-31 NOTE — Progress Notes
Pt. arrived to CA11 infusion room for 1U PRBC transfusion. VSS, PIV placed and positive for blood return, and assessment complete; please refer to doc flowsheet for details. Blood transfused without incident. No transfusion hypersensitivity reaction noted. VSS throughout infusions, please refer to doc flowsheet. PIV positive for blood return, flushed with saline, and removed per protocol. All questions and concerns addressed. Copy of AVS declined. Pt. left unit with spouse via wheelchair in stable condition.

## 2020-09-01 ENCOUNTER — Encounter: Admit: 2020-09-01 | Discharge: 2020-09-01 | Payer: MEDICARE

## 2020-09-01 ENCOUNTER — Ambulatory Visit: Admit: 2020-09-01 | Discharge: 2020-09-01 | Payer: MEDICARE

## 2020-09-01 DIAGNOSIS — D649 Anemia, unspecified: Secondary | ICD-10-CM

## 2020-09-01 DIAGNOSIS — K7460 Unspecified cirrhosis of liver: Secondary | ICD-10-CM

## 2020-09-01 DIAGNOSIS — K31819 Angiodysplasia of stomach and duodenum without bleeding: Secondary | ICD-10-CM

## 2020-09-01 DIAGNOSIS — K7469 Other cirrhosis of liver: Secondary | ICD-10-CM

## 2020-09-01 DIAGNOSIS — K760 Fatty (change of) liver, not elsewhere classified: Secondary | ICD-10-CM

## 2020-09-01 DIAGNOSIS — R188 Other ascites: Secondary | ICD-10-CM

## 2020-09-01 DIAGNOSIS — D500 Iron deficiency anemia secondary to blood loss (chronic): Secondary | ICD-10-CM

## 2020-09-01 LAB — CBC AND DIFF
Lab: 19 % — ABNORMAL HIGH (ref 11–15)
Lab: 25 % — ABNORMAL LOW (ref 36–45)
Lab: 26 pg (ref 26–34)
Lab: 3 M/UL — ABNORMAL LOW (ref 4.0–5.0)
Lab: 31 g/dL — ABNORMAL LOW (ref 32.0–36.0)
Lab: 6.5 10*3/uL (ref 4.5–11.0)
Lab: 8 g/dL — ABNORMAL LOW (ref 12.0–15.0)
Lab: 83 FL (ref 80–100)

## 2020-09-01 LAB — GRAM STAIN

## 2020-09-01 LAB — PROTIME INR (PT): Lab: 1.6 mg/dL — ABNORMAL HIGH (ref 0.8–1.2)

## 2020-09-01 LAB — COMPREHENSIVE METABOLIC PANEL: Lab: 134 MMOL/L — ABNORMAL LOW (ref 137–147)

## 2020-09-01 MED ORDER — ALBUMIN, HUMAN 25 % IV SOLP
0 refills | Status: CP
Start: 2020-09-01 — End: ?
  Administered 2020-09-01 (×2): 12.5 g via INTRAVENOUS
  Administered 2020-09-01: 16:00:00 25 g via INTRAVENOUS

## 2020-09-01 NOTE — Patient Education
Dear Ms. Shouse,     Thank you for choosing [facility] Interventional Radiology for your procedure. Your appointment information is listed below:     Appointment Date: 09/08/20, 09/15/20, 09/22/20, 09/29/20  Appointment Time: Sharlet [place]  Arrival Time: 9AM     Location:      Main Campus: [address], [place], [place]  [address]  Parking: [address]/ Please got to second floor to Interventional Radiology to get registered        INTERVENTIONAL RADIOLOGY  PRE-PROCEDURE INSTRUCTIONS LOCAL     You are scheduled for a procedure in Interventional Radiology.  Please follow these instructions and any direction from your Primary Care/Managing Physician.  If you have questions about your procedure or need to reschedule please call [phone].        Medication Instructions:  Continue scheduled medication.         Diet Instructions: Maintain regular diet with no restrictions.      Day of Exam Instructions:  1. Bathe or shower with an antibacterial soap prior to your appointment.  2. Bring a list of your current medications and the dosages.  3. Wear comfortable clothing and leave valuables at home.  4. Arrive 1 hour prior to your appointment.  This time will be spent registering, interviewing, assessing, educating, and preparing you for the test.  You will be with [place] anywhere from 30 minutes to 2 hours after your exam depending on your procedure.        5. Depending on the procedure and as instructed by the nurse a responsible adult may be required to drive you home (no Benedetto Goad, taxis or buses are allowed). If a driver is required and you do not have one  we will be unable to perform         your procedure.. The nurse will give you discharge instructions about your care and activities after the procedure.

## 2020-09-01 NOTE — Progress Notes
Interventional Radiology Outpatient Scheduling Checklist      1.  Name of Procedure(s):   Paracentesis      2.  Date of Procedure:   09/08/20, 09/15/20, 09/22/20, 09/29/20      3.  Arrival Time:  0900       4.  Procedure Time:  1000      5.  Correct Procedural Room Assignment:  IR BH rm 7      6.  Blood Thinners Triaged and instructed per protocol: Y/N/NA:  NA  Confirmed accurate instructions sent to patient: Y/N:  NA       7.  Procedure Order Verified: Y/N:  Yes      9.  Patient instructed to have a driver: Y/N/NA:  Yes    10.  Patient instructed on NPO status: Y/N/NA:  NA  Confirmed accurate instructions sent to patient: Y/N:  Yes    11.  Specimen needed: Y/N/NA:  Yes   Verified Order placed: Y/N:  Yes    12.  Allergies Verified:  Y/N:  Yes    13.  Is there an Iodine Allergy: Y/N:  No  Does the Procedure Require contrast: Y/N:  No   If so, was the IR- Contrast Allergy Pre-Procedure Medication protocol ordered: Y/NA:  NA    14.  Does the patient have labs according to IR Pre-procedure Laboratory Parameter policy: Y/N/NA:  Yes  If No, was the patient instructed to obtain labs prior to procedure: Y/N/NA:  NA     15.  Will the patient need to be admitted or have a possible admission: Y/N:  No  If yes, confirmed accurate instructions sent to patient: Y/N/NA:  NA     16.  Patient States Understanding: Y/N:  Yes    17.  History of OSA:  Y/N:  No  If yes, confirm request to bring CPAP sent to patient: Y/N/NA:  NA    18. Does the patient have an insulin pump or continuous glucose monitor? NA   If yes, was the patient instructed that this will need to be removed for this procedure and to bring supplies to reapply once the procedure is complete? NA    19. Patient was sent electronic procedure instructions: Y/N:  Yes

## 2020-09-01 NOTE — Progress Notes
Jiayi H Mikel discharged on 09/01/2020.   Marland Kitchen  Discharge instructions reviewed with patient.  Valuables returned:   Where Are Valuables Stored?: IR bay 18.  Home medications:    .  Functional assessment at discharge complete: Yes .      AVS given to patient. No further questions asked. PIV taken out. VS stable. Procedure site is clean, dry, and intact. Pt wheeled off unit and DC with husband.

## 2020-09-01 NOTE — Procedures
Immediate Post Procedure Note    Date:  09/01/2020                                           Attending Physician:   Dr Cameron Ali  Assistant(s):  Dr. Dewain Penning, Doyce Loose MS4    Procedure(s):  Paracentesis  Pre/Post Procedure Diagnosis:  Ascites    Consent:  Consent was obtained from the patient.   Time out performed: Consent confirmed, correct patient verified, correct procedure verified, correct site verified, patient marked as necessary, pre-procedural labs reviewed.  Anesthesia: Local 10 cc of subcutaneous 2% Lidocaine solution    Findings:  Clear straw-colored fluid aspirated. Please see separately dictated radiology report for full procedure details.    Estimated Blood Loss: None/ Negligible   Specimen(s) Removed/Disposition:  Yes, Sent to Lab for analysis.   Complications:  None   Comments: Patient tolerated the procedure well.     Francee Piccolo, MD

## 2020-09-01 NOTE — H&P (View-Only)
Pre Procedure History and Physical/Sedation Plan    Procedure Date: 09/01/2020     Planned Procedure(s):  U/S guided diagnostic/therapeutic paracentesis  Indication: Ascites  __________________________________________________________________    Chief Complaint: Ascites    History of Present Illness: Kristin Moody is a 64 y.o. female. Pt presents to IR for paracentesis. Pt complains of abdominal distention.  Patient Active Problem List    Diagnosis Date Noted   ? Pleurodynia 06/04/2020   ? Pneumothorax 06/04/2020   ? 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 Hypotension 02/10/2020   ? 'Light-for-dates' infant with signs of fetal malnutrition 01/22/2020   ? 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 Anemia 09/28/2019   ? Encephalopathy 08/12/2019   ? Spasticity 08/10/2019   ? Hyperreflexia 08/10/2019   ? 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 Right leg weakness 05/14/2019   ? 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 Iron (Fe) deficiency anemia 01/19/2019   ? Preop cardiovascular exam 01/07/2019   ? Pre-transplant evaluation for liver transplant 01/06/2019   ? Cirrhosis (HCC) 12/16/2018   ? Acute on chronic anemia 12/16/2018   ? Lactic acidosis 12/16/2018   ? GI bleed 10/24/2018   ? Portal hypertension (HCC) 08/28/2018   ? Confusion 08/27/2018   ? Dyspnea 08/27/2018   ? Chronic abdominal pain 08/27/2018   ? 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 Acute on chronic blood loss anemia 08/27/2018   ? Iron deficiency anemia 04/13/2018   ? Pneumonia due to infectious organism 04/13/2018   ? Melena 04/10/2018   ? Esophageal varices without bleeding (HCC) 02/25/2018   ? 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 Lower abdominal pain 10/24/2014   ? Chest discomfort 09/21/2014   ? Essential hypertension 08/13/2012   ? Abnormal liver function tests 05/13/2011   ? Fatty liver disease, nonalcoholic 05/13/2011   ? Obesity (BMI 30-39.9) 05/13/2011 ? Slow transit constipation 05/13/2011     Medical History:   Diagnosis Date   ? Aneurysm (HCC)    ? 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 Diverticulosis of colon     descending and sigmoid colon   ? Dyspnea    ? Essential hypertension 08/13/2012   ? Fatty infiltration of liver    ? HTN (hypertension)    ? Obesity (BMI 30-39.9) 05/13/2011   ? Preop cardiovascular exam 01/07/2019   ? Sinus infection    ? Type 2 diabetes mellitus (HCC) 09/21/2014      Surgical History:   Procedure Laterality Date   ? HYSTERECTOMY  1994   ? UPPER GASTROINTESTINAL ENDOSCOPY  2009   ? COLONOSCOPY  2009   ? CYSTOCELE REPAIR  2009    with endocele repair   ? RECTOCELE REPAIR  03/2009   ? LIVER BIOPSY  07/17/2010   ? COLONOSCOPY N/A 11/08/2017    Performed by Dawna Part, MD at [facility] ENDO   ? ESOPHAGOGASTRODUODENOSCOPY N/A 11/08/2017    Performed by Dawna Part, MD at [facility] ENDO   ? ESOPHAGOGASTRODUODENOSCOPY WITH BIOPSY - FLEXIBLE  11/08/2017    Performed by Dawna Part, MD at [facility] ENDO   ? COLONOSCOPY WITH HOT BIOPSY FORCEPS REMOVAL TUMOR/ POLYP/ OTHER LESION  11/08/2017    Performed by Dawna Part, MD at [facility] ENDO   ? ESOPHAGOGASTRODUODENOSCOPY WITH BAND LIGATION ESOPHAGEAL/ GASTRIC VARICES - FLEXIBLE N/A 04/10/2018    Performed by  Normajean Baxter, MD at [facility] ENDO   ? ESOPHAGOGASTRODUODENOSCOPY WITH BIOPSY - FLEXIBLE N/A 04/10/2018    Performed by Normajean Baxter, MD at [facility] ENDO   ? ESOPHAGOGASTRODUODENOSCOPY WITH CONTROL OF BLEEDING - FLEXIBLE N/A 04/25/2018    Performed by Jolee Ewing, MD at [facility] ENDO   ? ESOPHAGOGASTRODUODENOSCOPY WITH BIOPSY - FLEXIBLE with push enteroscopy N/A 08/28/2018    Performed by Celesta Gentile, MD at [facility] ENDO   ? EGD N/A 10/27/2018    Performed by Eliott Nine, MD at [facility] ENDO   ? ESOPHAGOGASTRODUODENOSCOPY WITH SNARE REMOVAL TUMOR/ POLYP/ OTHER LESION - FLEXIBLE N/A 10/27/2018    Performed by Eliott Nine, MD at [facility] ENDO   ? ESOPHAGOGASTRODUODENOSCOPY WITH BIOPSY - FLEXIBLE N/A 12/16/2018 Performed by Buckles, Vinnie Level, MD at [facility] ENDO   ? ESOPHAGOGASTRODUODENOSCOPY WITH CONTROL OF BLEEDING - FLEXIBLE N/A 12/16/2018    Performed by Buckles, Vinnie Level, MD at [facility] ENDO   ? ESOPHAGOGASTRODUODENOSCOPY WITH SPECIMEN COLLECTION BY BRUSHING/ WASHING N/A 01/22/2019    Performed by Veneta Penton, MD at [facility] ENDO   ? ESOPHAGOGASTRODUODENOSCOPY WITH DILATION ESOPHAGUS WITH BALLOON 30 MM OR GREATER - FLEXIBLE N/A 01/22/2019    Performed by Veneta Penton, MD at [facility] ENDO   ? ESOPHAGOGASTRODUODENOSCOPY WITH BIOPSY - FLEXIBLE N/A 01/22/2019    Performed by Veneta Penton, MD at [facility] ENDO   ? ESOPHAGOGASTRODUODENOSCOPY WITH BIOPSY - FLEXIBLE N/A 06/19/2019    Performed by Dawna Part, MD at [facility] ENDO   ? ESOPHAGOGASTRODUODENOSCOPY [WITH APC]  WITH SPECIMEN COLLECTION BY BRUSHING/ WASHING N/A 08/07/2019    Performed by Dawna Part, MD at [facility] ENDO   ? ESOPHAGOGASTRODUODENOSCOPY WITH SPECIMEN COLLECTION BY BRUSHING/ WASHING N/A 09/10/2019    Performed by Buckles, Vinnie Level, MD at [facility] ENDO   ? ESOPHAGOGASTRODUODENOSCOPY WITH CONTROL OF BLEEDING - FLEXIBLE N/A 09/10/2019    Performed by Buckles, Vinnie Level, MD at [facility] ENDO   ? ESOPHAGOGASTRODUODENOSCOPY WITH CONTROL OF BLEEDING - FLEXIBLE N/A 02/12/2020    Performed by Lenor Derrick, MD at [facility] ENDO   ? ESOPHAGOGASTRODUODENOSCOPY WITH BIOPSY - FLEXIBLE N/A 02/26/2020    Performed by Buckles, Vinnie Level, MD at [facility] ENDO   ? ESOPHAGOGASTRODUODENOSCOPY WITH BIOPSY - FLEXIBLE N/A 05/18/2020    Performed by Lenor Derrick, MD at [facility] ENDO   ? ESOPHAGOGASTRODUODENOSCOPY WITH CONTROL OF BLEEDING - FLEXIBLE N/A 05/18/2020    Performed by Lenor Derrick, MD at [facility] ENDO   ? ESOPHAGOGASTRODUODENOSCOPY WITH CONTROL OF BLEEDING - FLEXIBLE N/A 07/08/2020    Performed by Vertell Novak, MD at [facility] ENDO   ? EGD N/A 08/05/2020    Performed by Vertell Novak, MD at [facility] ENDO   ? ESOPHAGOGASTRODUODENOSCOPY WITH CONTROL OF BLEEDING - FLEXIBLE N/A 08/05/2020    Performed by Vertell Novak, MD at [facility] ENDO   ? CHOLECYSTECTOMY     ? COLONOSCOPY     ? ESOPHAGOGASTRIC FUNDOPLICATION  2003, 2004    laparoscopic   ? ESOPHAGOGASTRIC FUNDOPLICATION     ? HX HYSTERECTOMY     ? PR ESOPHAGOSCOPY FLEXIBLE TRANSORAL DIAGNOSTIC        Medications Prior to Admission   Medication Sig Dispense Refill Last Dose   ? abaloparatide (TYMLOS) 80 mcg (3,120 mcg/1.56 mL) injection pen Inject eighty mcg under the skin daily. Administer into the periumbilical region of the abdomen while sitting or lying down in case of orthostatic hypotension. 3.12 mL 3    ? ciprofloxacin (CIPRO) 500 mg tablet Take one  tablet by mouth daily. 30 tablet 1 08/31/2020   ? duloxetine DR (CYMBALTA) 60 mg capsule Take 60 mg by mouth daily.   09/01/2020   ? furosemide (LASIX) 40 mg tablet Take one-half tablet by mouth every morning. 90 tablet 0 09/01/2020   ? lidocaine (ASPERCREME PATCH) 4 % topical patch Apply one each topically to affected area daily. 5 each 1    ? midodrine (PROAMITINE) 10 mg tablet TAKE 1.5 TABLETS BY MOUTH THREE TIMES DAILY. 405 tablet 1 09/01/2020   ? ondansetron HCL (ZOFRAN) 4 mg tablet Take one tablet by mouth every 6 hours as needed for Nausea or Vomiting. 120 tablet 1 09/01/2020   ? oxyCODONE (ROXICODONE) 5 mg tablet Take one tablet by mouth every 6 hours as needed 20 tablet 0 Unknown   ? pantoprazole DR (PROTONIX) 40 mg tablet TAKE 1 TABLET BY MOUTH TWICE A DAY 180 tablet 1 09/01/2020   ? polyethylene glycol 3350 (MIRALAX) 17 g packet Take one packet by mouth twice daily.   Unknown   ? promethazine (PHENERGAN) 25 mg tablet Take 25 mg by mouth every 4 hours as needed. Just uses it with Tramadol   Unknown   ? rifAXIMin (XIFAXAN) 550 mg tablet Take one tablet by mouth twice daily. 180 tablet 3 09/01/2020   ? zinc sulfate 220 mg (50 mg elemental zinc) capsule Take one capsule by mouth daily. 90 capsule 3 Unknown     Allergies   Allergen Reactions   ? Tegaderm BLISTERS   ? Adhesive Tape (Rosins) RASH     Other reaction(s): irritated skin   ? Sulfa (Sulfonamide Antibiotics) RASH     Other reaction(s): Unknown Reaction   ? Codeine NAUSEA AND VOMITING   ? Morphine SEE COMMENTS     Pt reports burns her veins  Other reaction(s): Unknown Reaction   ? Penicillin G SEE COMMENTS     Throat issues, blisters in throat    ? Tramadol NAUSEA ONLY and ITCHING     Takes this medication regularly        Social History:   Social History     Tobacco Use   ? Smoking status: Never Smoker   ? Smokeless tobacco: Never Used   Substance Use Topics   ? Alcohol use: No      Family History   Problem Relation Age of Onset   ? Other Mother    ? Kidney Failure Mother    ? Osteoporosis Mother    ? Cancer Father         esophageal   ? Liver Disease Sister         endstage, s/p liver transplant   ? Cancer Brother         tonsil, liver    ? Hip Fracture Neg Hx         Review of Systems  Positive abdominal distention    Previous Anesthetic/Sedation History:  N/A-procedure done with local anesthetic    Physical Exam:  Vital Signs: Last Filed In 24 Hours Vital Signs: 24 Hour Range   BP: 118/66 (10/14 1000)  Temp: 36.8 ?C (98.2 ?F) (10/14 1000)  Pulse: 91 (10/14 1000)  Respirations: 24 PER MINUTE (10/14 1000)  SpO2: 97 % (10/14 1000)  SpO2 Pulse: 90 (10/14 1000) BP: (112-118)/(48-66)   Temp:  [36.7 ?C (98.1 ?F)-36.9 ?C (98.5 ?F)]   Pulse:  [89-91]   Respirations:  [18 PER MINUTE-24 PER MINUTE]   SpO2:  [97 %-100 %]  General appearance: alert  Neurologic: Grossly normal  Lungs: Nonlabored  Abdomen: abnormal findings:  distended       Anesthesia Classification:  ASA III (A patient with a severe systemic disease that limits activity, but is not incapacitating)  Sedation/Medication Plan: Local anesthetic  Medications for Reversal: N/A-local anesthetic  Discussion/Reviews:  Physician has discussed risks and alternatives of this type of sedation and above planned procedures with patient  NPO Status: N/A Local anesthetic        Lab/Radiology/Other Diagnostic Tests:  Labs:  Pertinent labs reviewed           Margarita Grizzle, APRN-NP  Pager [phone]

## 2020-09-01 NOTE — Patient Instructions
IR-PARACENTESIS   A paracentesis is the removal of an abnormal buildup of fluid in your abdominal cavity. This fluid buildup is called ascites and may be caused by conditions such as liver disease, heart failure, or cancer. During this procedure, a needle is inserted into your abdomen to drain the fluid. The fluid may then be sent to the lab for testing if medically indicated. Removal of the fluid may also relieve belly pressure and shortness of breath caused by the ascites.?  POST-PROCEDURE PAIN:   ? Pain control following your procedure is a priority for both you and your Physicians.  ? Some soreness or tenderness at the site is to be expected for several days. We recommend taking over the counter analgesics to help relieve this pain.  ? Alternative methods for pain relief include but not limited to heat or cold compress, relaxation techniques, rest, and changing of positions.  ? If pain continues after 5-7 days or you have severe pain not relieved by medication, please contact us as directed below.?  POST-PROCEDURE ACTIVITY:   ? A responsible adult must drive you home.  ? If you receive sedation, narcotic pain medication or anesthesia for the procedure, you should not drive or operate heavy machinery or do anything that requires concentration for at least 24 hours after procedure completion.  ? It is recommended that a responsible adult be with you until morning.?  POST-PROCEDURE SITE CARE:   ? You will have a small bandage over the procedure site. Keep this dry.  ? You may remove it in 24 hours.  ? You may shower in 24 hours, after removing the bandage.  ? A dry gauze bandage may be reapplied as necessary to protect your clothing as the site may sometimes leak for several days after the procedure.  ? Do not submerge the procedure site for 1 week (no bathtub, swimming, hot tub, etc.)  ? Do not use ointments, creams or powders on the puncture site.  ? Be sure your hands are clean when touching near the site.?  DIET/MEDICATIONS:   ? You may resume your previous diet after the procedure.  ? If you receive sedation or narcotic pain medications, avoid any foods or beverages containing alcohol for at least 24 hours after the procedure.  ? Please see the Medication Reconciliation sheet for instructions regarding resuming your home medications.?  CALL THE DOCTOR IF:   ? Bright red blood soaks the bandage.  ? You have pain not relieved by medication. Some soreness at the site is to be expected.  ? You have signs of infection such as: fever greater than 101F, chills, redness, warmth, swelling, drainage or pus from the puncture site.  For any of the above symptoms or for problems or concerns related to the procedure performed at the [facility], call [phone] Monday-Friday from 7-5p. After-hours and weekends, please call [phone] and ask for the Interventional Radiology Resident on-call.   You or your caregiver should call 911 for any severe symptoms such as excessive bleeding, severe dizziness, trouble breathing or loss of consciousness.   ?  ?

## 2020-09-02 ENCOUNTER — Encounter: Admit: 2020-09-02 | Discharge: 2020-09-02 | Payer: MEDICARE

## 2020-09-02 NOTE — Telephone Encounter
Fax sent to inform Crystal wrong DOB   Second attempt sent on 08/22/2020

## 2020-09-05 ENCOUNTER — Ambulatory Visit: Admit: 2020-09-05 | Discharge: 2020-09-05 | Payer: MEDICARE

## 2020-09-05 ENCOUNTER — Encounter: Admit: 2020-09-05 | Discharge: 2020-09-05 | Payer: MEDICARE

## 2020-09-05 DIAGNOSIS — K7460 Unspecified cirrhosis of liver: Secondary | ICD-10-CM

## 2020-09-05 DIAGNOSIS — K31819 Angiodysplasia of stomach and duodenum without bleeding: Secondary | ICD-10-CM

## 2020-09-05 DIAGNOSIS — D500 Iron deficiency anemia secondary to blood loss (chronic): Secondary | ICD-10-CM

## 2020-09-05 DIAGNOSIS — D649 Anemia, unspecified: Secondary | ICD-10-CM

## 2020-09-05 LAB — CBC AND DIFF
Lab: 0.1 10*3/uL (ref 0–0.20)
Lab: 0.1 10*3/uL (ref 0–0.45)
Lab: 0.6 10*3/uL — ABNORMAL LOW (ref 1.0–4.8)
Lab: 0.7 10*3/uL (ref 0–0.80)
Lab: 11 % — ABNORMAL LOW (ref 24–44)
Lab: 12 % (ref 4–12)
Lab: 19 % — ABNORMAL HIGH (ref >60)
Lab: 2 % (ref 0–2)
Lab: 2 % (ref 0–5)
Lab: 231 10*3/uL — ABNORMAL LOW (ref >60)
Lab: 24 % — ABNORMAL LOW (ref 36–45)
Lab: 26 pg (ref 26–34)
Lab: 3 M/UL — ABNORMAL LOW (ref 4.0–5.0)
Lab: 32 g/dL (ref 32.0–36.0)
Lab: 4.3 10*3/uL (ref 1.8–7.0)
Lab: 6 K/UL — ABNORMAL HIGH (ref 4.5–11.0)
Lab: 73 % (ref 41–77)
Lab: 8 g/dL — ABNORMAL LOW (ref 12.0–15.0)
Lab: 82 FL (ref 80–100)

## 2020-09-05 LAB — COMPREHENSIVE METABOLIC PANEL
Lab: 1.3 mg/dL — ABNORMAL HIGH (ref 0.4–1.00)
Lab: 124 mg/dL — ABNORMAL HIGH (ref 70–100)
Lab: 131 MMOL/L — ABNORMAL LOW (ref 137–147)
Lab: 35 mg/dL — ABNORMAL HIGH (ref 7–25)
Lab: 4.9 MMOL/L (ref 3.5–5.1)
Lab: 5.7 g/dL — ABNORMAL LOW (ref 6.0–8.0)
Lab: 9 mg/dL (ref 8.5–10.6)

## 2020-09-05 LAB — PROTIME INR (PT): Lab: 1.5 MMOL/L — ABNORMAL HIGH (ref 0.8–1.2)

## 2020-09-05 MED ORDER — CIPROFLOXACIN HCL 500 MG PO TAB
500 mg | ORAL_TABLET | Freq: Every day | ORAL | 3 refills | 10.00000 days | Status: AC
Start: 2020-09-05 — End: ?
  Filled 2020-09-09: qty 8, 4d supply, fill #1

## 2020-09-07 ENCOUNTER — Encounter: Admit: 2020-09-07 | Discharge: 2020-09-07 | Payer: MEDICARE

## 2020-09-07 ENCOUNTER — Ambulatory Visit: Admit: 2020-09-07 | Discharge: 2020-09-07 | Payer: MEDICARE

## 2020-09-07 DIAGNOSIS — R710 Precipitous drop in hematocrit: Secondary | ICD-10-CM

## 2020-09-07 DIAGNOSIS — D508 Other iron deficiency anemias: Secondary | ICD-10-CM

## 2020-09-07 DIAGNOSIS — K31819 Angiodysplasia of stomach and duodenum without bleeding: Secondary | ICD-10-CM

## 2020-09-07 NOTE — Progress Notes
Pt arrived to [facility] treatment room. VSS, PIV placed. Coralee North contacted for orders. 1 unit RBC transfused w/o complication, VSS throughout. PIV removed per protocol. Pt left unit via wheelchair in stable condition.

## 2020-09-07 NOTE — Progress Notes
error 

## 2020-09-08 ENCOUNTER — Encounter: Admit: 2020-09-08 | Discharge: 2020-09-08 | Payer: MEDICARE

## 2020-09-08 ENCOUNTER — Ambulatory Visit: Admit: 2020-09-08 | Discharge: 2020-09-08 | Payer: MEDICARE

## 2020-09-08 DIAGNOSIS — K31819 Angiodysplasia of stomach and duodenum without bleeding: Secondary | ICD-10-CM

## 2020-09-08 DIAGNOSIS — R188 Other ascites: Secondary | ICD-10-CM

## 2020-09-08 DIAGNOSIS — K760 Fatty (change of) liver, not elsewhere classified: Secondary | ICD-10-CM

## 2020-09-08 DIAGNOSIS — K7469 Other cirrhosis of liver: Secondary | ICD-10-CM

## 2020-09-08 DIAGNOSIS — K7460 Unspecified cirrhosis of liver: Secondary | ICD-10-CM

## 2020-09-08 DIAGNOSIS — D500 Iron deficiency anemia secondary to blood loss (chronic): Secondary | ICD-10-CM

## 2020-09-08 LAB — GRAM STAIN

## 2020-09-08 MED ORDER — ALBUMIN, HUMAN 25 % IV SOLP
0 refills | Status: CP
Start: 2020-09-08 — End: ?
  Administered 2020-09-08 (×3): 12.5 g via INTRAVENOUS

## 2020-09-08 NOTE — Patient Instructions
IR-PARACENTESIS   A paracentesis is the removal of an abnormal buildup of fluid in your abdominal cavity. This fluid buildup is called ascites and may be caused by conditions such as liver disease, heart failure, or cancer. During this procedure, a needle is inserted into your abdomen to drain the fluid. The fluid may then be sent to the lab for testing if medically indicated. Removal of the fluid may also relieve belly pressure and shortness of breath caused by the ascites.?  POST-PROCEDURE PAIN:   ? Pain control following your procedure is a priority for both you and your Physicians.  ? Some soreness or tenderness at the site is to be expected for several days. We recommend taking over the counter analgesics to help relieve this pain.  ? Alternative methods for pain relief include but not limited to heat or cold compress, relaxation techniques, rest, and changing of positions.  ? If pain continues after 5-7 days or you have severe pain not relieved by medication, please contact us as directed below.?  POST-PROCEDURE ACTIVITY:   ? A responsible adult must drive you home.  ? If you receive sedation, narcotic pain medication or anesthesia for the procedure, you should not drive or operate heavy machinery or do anything that requires concentration for at least 24 hours after procedure completion.  ? It is recommended that a responsible adult be with you until morning.?  POST-PROCEDURE SITE CARE:   ? You will have a small bandage over the procedure site. Keep this dry.  ? You may remove it in 24 hours.  ? You may shower in 24 hours, after removing the bandage.  ? A dry gauze bandage may be reapplied as necessary to protect your clothing as the site may sometimes leak for several days after the procedure.  ? Do not submerge the procedure site for 1 week (no bathtub, swimming, hot tub, etc.)  ? Do not use ointments, creams or powders on the puncture site.  ? Be sure your hands are clean when touching near the site.?  DIET/MEDICATIONS:   ? You may resume your previous diet after the procedure.  ? If you receive sedation or narcotic pain medications, avoid any foods or beverages containing alcohol for at least 24 hours after the procedure.  ? Please see the Medication Reconciliation sheet for instructions regarding resuming your home medications.?  CALL THE DOCTOR IF:   ? Bright red blood soaks the bandage.  ? You have pain not relieved by medication. Some soreness at the site is to be expected.  ? You have signs of infection such as: fever greater than 101F, chills, redness, warmth, swelling, drainage or pus from the puncture site.  For any of the above symptoms or for problems or concerns related to the procedure performed at the [facility], call [phone] Monday-Friday from 7-5p. After-hours and weekends, please call [phone] and ask for the Interventional Radiology Resident on-call.   You or your caregiver should call 911 for any severe symptoms such as excessive bleeding, severe dizziness, trouble breathing or loss of consciousness.   ?  ?

## 2020-09-08 NOTE — H&P (View-Only)
IR Pre-Procedure History and Physical/Sedation Plan    Procedure Date: 09/08/2020     Planned Procedure(s):  Ultrasound-guided paracentesis     Indication:  Fluid testing; Therapeutic drainage  __________________________________________________________________    Chief Complaint:  Ascites    History of Present Illness: Kristin Moody is a 64 y.o. female with a history as listed below who presents today for procedure.    Patient Active Problem List    Diagnosis Date Noted   ? Pleurodynia 06/04/2020   ? Pneumothorax 06/04/2020   ? 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 Hypotension 02/10/2020   ? 'Light-for-dates' infant with signs of fetal malnutrition 01/22/2020   ? 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 Anemia 09/28/2019   ? Encephalopathy 08/12/2019   ? Spasticity 08/10/2019   ? Hyperreflexia 08/10/2019   ? 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 Right leg weakness 05/14/2019   ? 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 Iron (Fe) deficiency anemia 01/19/2019   ? Preop cardiovascular exam 01/07/2019   ? Pre-transplant evaluation for liver transplant 01/06/2019   ? Cirrhosis (HCC) 12/16/2018   ? Acute on chronic anemia 12/16/2018   ? Lactic acidosis 12/16/2018   ? GI bleed 10/24/2018   ? Portal hypertension (HCC) 08/28/2018   ? Confusion 08/27/2018   ? Dyspnea 08/27/2018   ? Chronic abdominal pain 08/27/2018   ? 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 Acute on chronic blood loss anemia 08/27/2018   ? Iron deficiency anemia 04/13/2018   ? Pneumonia due to infectious organism 04/13/2018   ? Melena 04/10/2018   ? Esophageal varices without bleeding (HCC) 02/25/2018   ? 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 Lower abdominal pain 10/24/2014   ? Chest discomfort 09/21/2014   ? Essential hypertension 08/13/2012   ? Abnormal liver function tests 05/13/2011   ? Fatty liver disease, nonalcoholic 05/13/2011   ? Obesity (BMI 30-39.9) 05/13/2011   ? Slow transit constipation 05/13/2011     Medical History:   Diagnosis Date   ? Aneurysm (HCC)    ? 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 Diverticulosis of colon     descending and sigmoid colon   ? Dyspnea    ? Essential hypertension 08/13/2012   ? Fatty infiltration of liver    ? HTN (hypertension)    ? Obesity (BMI 30-39.9) 05/13/2011   ? Preop cardiovascular exam 01/07/2019   ? Sinus infection    ? Type 2 diabetes mellitus (HCC) 09/21/2014      Surgical History:   Procedure Laterality Date   ? HYSTERECTOMY  1994   ? UPPER GASTROINTESTINAL ENDOSCOPY  2009   ? COLONOSCOPY  2009   ? CYSTOCELE REPAIR  2009    with endocele repair   ? RECTOCELE REPAIR  03/2009   ? LIVER BIOPSY  07/17/2010   ? COLONOSCOPY N/A 11/08/2017    Performed by Dawna Part, MD at [facility] ENDO   ? ESOPHAGOGASTRODUODENOSCOPY N/A 11/08/2017    Performed by Dawna Part, MD at [facility] ENDO   ? ESOPHAGOGASTRODUODENOSCOPY WITH BIOPSY - FLEXIBLE  11/08/2017    Performed by Dawna Part, MD at [facility] ENDO   ? COLONOSCOPY WITH HOT BIOPSY FORCEPS REMOVAL TUMOR/ POLYP/ OTHER LESION  11/08/2017    Performed by Dawna Part, MD at [facility] ENDO   ? ESOPHAGOGASTRODUODENOSCOPY WITH BAND LIGATION ESOPHAGEAL/ GASTRIC  VARICES - FLEXIBLE N/A 04/10/2018    Performed by Normajean Baxter, MD at [facility] ENDO   ? ESOPHAGOGASTRODUODENOSCOPY WITH BIOPSY - FLEXIBLE N/A 04/10/2018    Performed by Normajean Baxter, MD at [facility] ENDO   ? ESOPHAGOGASTRODUODENOSCOPY WITH CONTROL OF BLEEDING - FLEXIBLE N/A 04/25/2018    Performed by Jolee Ewing, MD at [facility] ENDO   ? ESOPHAGOGASTRODUODENOSCOPY WITH BIOPSY - FLEXIBLE with push enteroscopy N/A 08/28/2018    Performed by Celesta Gentile, MD at [facility] ENDO   ? EGD N/A 10/27/2018    Performed by Eliott Nine, MD at [facility] ENDO   ? ESOPHAGOGASTRODUODENOSCOPY WITH SNARE REMOVAL TUMOR/ POLYP/ OTHER LESION - FLEXIBLE N/A 10/27/2018    Performed by Eliott Nine, MD at [facility] ENDO   ? ESOPHAGOGASTRODUODENOSCOPY WITH BIOPSY - FLEXIBLE N/A 12/16/2018    Performed by Buckles, Vinnie Level, MD at [facility] ENDO   ? ESOPHAGOGASTRODUODENOSCOPY WITH CONTROL OF BLEEDING - FLEXIBLE N/A 12/16/2018    Performed by Buckles, Vinnie Level, MD at [facility] ENDO   ? ESOPHAGOGASTRODUODENOSCOPY WITH SPECIMEN COLLECTION BY BRUSHING/ WASHING N/A 01/22/2019    Performed by Veneta Penton, MD at [facility] ENDO   ? ESOPHAGOGASTRODUODENOSCOPY WITH DILATION ESOPHAGUS WITH BALLOON 30 MM OR GREATER - FLEXIBLE N/A 01/22/2019    Performed by Veneta Penton, MD at [facility] ENDO   ? ESOPHAGOGASTRODUODENOSCOPY WITH BIOPSY - FLEXIBLE N/A 01/22/2019    Performed by Veneta Penton, MD at [facility] ENDO   ? ESOPHAGOGASTRODUODENOSCOPY WITH BIOPSY - FLEXIBLE N/A 06/19/2019    Performed by Dawna Part, MD at [facility] ENDO   ? ESOPHAGOGASTRODUODENOSCOPY [WITH APC]  WITH SPECIMEN COLLECTION BY BRUSHING/ WASHING N/A 08/07/2019    Performed by Dawna Part, MD at [facility] ENDO   ? ESOPHAGOGASTRODUODENOSCOPY WITH SPECIMEN COLLECTION BY BRUSHING/ WASHING N/A 09/10/2019    Performed by Buckles, Vinnie Level, MD at [facility] ENDO   ? ESOPHAGOGASTRODUODENOSCOPY WITH CONTROL OF BLEEDING - FLEXIBLE N/A 09/10/2019    Performed by Buckles, Vinnie Level, MD at [facility] ENDO   ? ESOPHAGOGASTRODUODENOSCOPY WITH CONTROL OF BLEEDING - FLEXIBLE N/A 02/12/2020    Performed by Lenor Derrick, MD at [facility] ENDO   ? ESOPHAGOGASTRODUODENOSCOPY WITH BIOPSY - FLEXIBLE N/A 02/26/2020    Performed by Buckles, Vinnie Level, MD at [facility] ENDO   ? ESOPHAGOGASTRODUODENOSCOPY WITH BIOPSY - FLEXIBLE N/A 05/18/2020    Performed by Lenor Derrick, MD at [facility] ENDO   ? ESOPHAGOGASTRODUODENOSCOPY WITH CONTROL OF BLEEDING - FLEXIBLE N/A 05/18/2020    Performed by Lenor Derrick, MD at [facility] ENDO   ? ESOPHAGOGASTRODUODENOSCOPY WITH CONTROL OF BLEEDING - FLEXIBLE N/A 07/08/2020    Performed by Vertell Novak, MD at [facility] ENDO   ? EGD N/A 08/05/2020    Performed by Vertell Novak, MD at [facility] ENDO   ? ESOPHAGOGASTRODUODENOSCOPY WITH CONTROL OF BLEEDING - FLEXIBLE N/A 08/05/2020    Performed by Vertell Novak, MD at [facility] ENDO   ? CHOLECYSTECTOMY     ? COLONOSCOPY     ? ESOPHAGOGASTRIC FUNDOPLICATION  2003, 2004    laparoscopic   ? ESOPHAGOGASTRIC FUNDOPLICATION     ? HX HYSTERECTOMY     ? PR ESOPHAGOSCOPY FLEXIBLE TRANSORAL DIAGNOSTIC        Social History     Tobacco Use   ? Smoking status: Never Smoker   ? Smokeless tobacco: Never Used   Substance Use Topics   ? Alcohol use: No      Family History   Problem Relation Age of Onset   ?  Other Mother    ? Kidney Failure Mother    ? Osteoporosis Mother    ? Cancer Father         esophageal   ? Liver Disease Sister         endstage, s/p liver transplant   ? Cancer Brother         tonsil, liver    ? Hip Fracture Neg Hx       Medications Prior to Admission   Medication Sig Dispense Refill Last Dose   ? abaloparatide (TYMLOS) 80 mcg (3,120 mcg/1.56 mL) injection pen Inject eighty mcg under the skin daily. Administer into the periumbilical region of the abdomen while sitting or lying down in case of orthostatic hypotension. 3.12 mL 3 09/08/2020   ? ciprofloxacin (CIPRO) 500 mg tablet Take one tablet by mouth daily. 90 tablet 3 09/08/2020   ? duloxetine DR (CYMBALTA) 60 mg capsule Take 60 mg by mouth daily.   09/08/2020   ? furosemide (LASIX) 40 mg tablet Take one-half tablet by mouth every morning. 90 tablet 0 09/08/2020   ? lidocaine (ASPERCREME PATCH) 4 % topical patch Apply one each topically to affected area daily. 5 each 1 09/08/2020   ? midodrine (PROAMITINE) 10 mg tablet TAKE 1.5 TABLETS BY MOUTH THREE TIMES DAILY. 405 tablet 1 09/08/2020   ? ondansetron HCL (ZOFRAN) 4 mg tablet Take one tablet by mouth every 6 hours as needed for Nausea or Vomiting. 120 tablet 1 09/08/2020   ? oxyCODONE (ROXICODONE) 5 mg tablet Take one tablet by mouth every 6 hours as needed 20 tablet 0 09/08/2020   ? pantoprazole DR (PROTONIX) 40 mg tablet TAKE 1 TABLET BY MOUTH TWICE A DAY 180 tablet 1 09/08/2020   ? polyethylene glycol 3350 (MIRALAX) 17 g packet Take one packet by mouth twice daily.   09/08/2020   ? promethazine (PHENERGAN) 25 mg tablet Take 25 mg by mouth every 4 hours as needed. Just uses it with Tramadol   09/08/2020   ? rifAXIMin (XIFAXAN) 550 mg tablet Take one tablet by mouth twice daily. 180 tablet 3 09/08/2020   ? zinc sulfate 220 mg (50 mg elemental zinc) capsule Take one capsule by mouth daily. 90 capsule 3 09/08/2020     Allergies   Allergen Reactions   ? Tegaderm BLISTERS   ? Adhesive Tape (Rosins) RASH     Other reaction(s): irritated skin   ? Sulfa (Sulfonamide Antibiotics) RASH     Other reaction(s): Unknown Reaction   ? Codeine NAUSEA AND VOMITING   ? Morphine SEE COMMENTS     Pt reports burns her veins  Other reaction(s): Unknown Reaction   ? Penicillin G SEE COMMENTS     Throat issues, blisters in throat    ? Tramadol NAUSEA ONLY and ITCHING     Takes this medication regularly        Review of Systems  Constitutional: negative  Ears, nose, mouth, throat, and face: negative  Respiratory: negative  Cardiovascular: negative  Gastrointestinal: positive for abdominal distension  Musculoskeletal:negative  Neurological: negative  Behavioral/Psych: negative      Physical Exam:  Vital Signs: Last Filed In 24 Hours Vital Signs: 24 Hour Range   BP: 133/70 (10/21 0856)  Temp: 36.7 ?C (98 ?F) (10/20 1138)  Pulse: 96 (10/21 0856)  Respirations: 24 PER MINUTE (10/21 0856)  SpO2: 99 % (10/21 0856)  SpO2 Pulse: 96 (10/21 0856)  Height: 161.3 cm (63.5) (10/21 0856) BP: (107-133)/(44-70)   Temp:  [  36.7 ?C (98 ?F)-37.1 ?C (98.7 ?F)]   Pulse:  [96-99]   Respirations:  [16 PER MINUTE-24 PER MINUTE]   SpO2:  [98 %-99 %]    Intensity Pain Scale (Self Report): 5 (09/08/20 0856)      General appearance: alert and no distress noted.  Neurologic: Grossly normal.  Lungs: Non labored.  Heart: regular rate and rhythm  Abdomen: distended    Pre-procedure anxiolysis plan: N/A  Sedation/Medication Plan: Local anesthetic  Personal history of sedation complications: Denies adverse event.   Family history of sedation complications: Denies adverse event.   Medications for Reversal: NA  Discussion/Reviews:  Physician has discussed risks and alternatives of this type of sedation and above planned procedures with patient    NPO Status: NA  Airway:  NA  Head and Neck: NA  Mouth: NA   Anesthesia Classification:  NA local only   Pregnancy Status: N/A    Lab/Radiology/Other Diagnostic Tests:  Labs:  Pertinent labs reviewed           Kathleen Lime, APRN-NP  Pager [phone]

## 2020-09-08 NOTE — Other
Immediate Post Procedure Note    Date:  09/08/2020                                         Attending Physician:  Peter Garter, MD    Consent:  Consent obtained from patient.  Time out performed: Consent obtained, correct patient verified, correct procedure verified, correct site verified, patient marked as necessary.  Pre/Post Procedure Diagnosis/Indication:  ascites    Anesthesia: local  Procedure(s):  paracentesis. Please see separate PACS dictation for further detail.  Findings: Large volume ascites.    Estimated Blood Loss:  None/Negligible  Specimen(s) Removed/Disposition:  Clear yellow ascites. See dictation for volume removed.  Complications: None  Patient Tolerated Procedure: Well  Post-Procedure Condition:  Stable    Peter Garter MD

## 2020-09-11 ENCOUNTER — Encounter: Admit: 2020-09-11 | Discharge: 2020-09-12 | Payer: MEDICARE

## 2020-09-11 DIAGNOSIS — Z20822 Encounter for screening laboratory testing for COVID-19 virus in asymptomatic patient: Secondary | ICD-10-CM

## 2020-09-12 ENCOUNTER — Ambulatory Visit: Admit: 2020-09-12 | Discharge: 2020-09-12 | Payer: MEDICARE

## 2020-09-12 ENCOUNTER — Encounter: Admit: 2020-09-12 | Discharge: 2020-09-12 | Payer: MEDICARE

## 2020-09-12 ENCOUNTER — Inpatient Hospital Stay: Admit: 2020-09-12 | Payer: MEDICARE

## 2020-09-12 ENCOUNTER — Inpatient Hospital Stay: Admit: 2020-09-12 | Discharge: 2020-09-12 | Payer: MEDICARE

## 2020-09-12 DIAGNOSIS — D649 Anemia, unspecified: Secondary | ICD-10-CM

## 2020-09-12 DIAGNOSIS — N179 Acute kidney failure, unspecified: Secondary | ICD-10-CM

## 2020-09-12 DIAGNOSIS — K7460 Unspecified cirrhosis of liver: Secondary | ICD-10-CM

## 2020-09-12 DIAGNOSIS — K31819 Angiodysplasia of stomach and duodenum without bleeding: Secondary | ICD-10-CM

## 2020-09-12 DIAGNOSIS — D500 Iron deficiency anemia secondary to blood loss (chronic): Secondary | ICD-10-CM

## 2020-09-12 LAB — CBC
Lab: 19 % — ABNORMAL HIGH (ref 11–15)
Lab: 25 % — ABNORMAL LOW (ref 36–45)
Lab: 25 pg — ABNORMAL LOW (ref 26–34)
Lab: 291 K/UL — ABNORMAL LOW (ref 150–400)
Lab: 3.1 M/UL — ABNORMAL LOW (ref >60)
Lab: 31 g/dL — ABNORMAL LOW (ref 32.0–36.0)
Lab: 5.9 K/UL — ABNORMAL HIGH (ref >60)
Lab: 8 g/dL — ABNORMAL LOW (ref 12.0–15.0)
Lab: 8.7 FL (ref 7–11)
Lab: 82 FL — ABNORMAL HIGH (ref 80–100)

## 2020-09-12 LAB — COMPREHENSIVE METABOLIC PANEL
Lab: 134 MMOL/L — ABNORMAL LOW (ref 137–147)
Lab: 150 mg/dL — ABNORMAL HIGH (ref 70–100)
Lab: 17 mL/min — ABNORMAL LOW (ref >60)
Lab: 20 mL/min — ABNORMAL LOW (ref >60)
Lab: 4.6 MMOL/L (ref 3.5–5.1)
Lab: 10 (ref 3–12)

## 2020-09-12 LAB — PROTIME INR (PT): Lab: 1.4 MMOL/L — ABNORMAL HIGH (ref 0.8–1.2)

## 2020-09-12 MED ORDER — PROMETHAZINE 25 MG PO TAB
25 mg | ORAL | 0 refills | Status: AC | PRN
Start: 2020-09-12 — End: ?

## 2020-09-12 MED ORDER — POLYETHYLENE GLYCOL 3350 17 GRAM PO PWPK
17 g | Freq: Two times a day (BID) | ORAL | 0 refills | Status: AC
Start: 2020-09-12 — End: ?
  Administered 2020-09-13 – 2020-09-14 (×4): 17 g via ORAL

## 2020-09-12 MED ORDER — DULOXETINE 60 MG PO CPDR
60 mg | Freq: Every day | ORAL | 0 refills | Status: AC
Start: 2020-09-12 — End: ?

## 2020-09-12 MED ORDER — CIPROFLOXACIN HCL 500 MG PO TAB
250 mg | Freq: Every day | ORAL | 0 refills | Status: AC
Start: 2020-09-12 — End: ?
  Administered 2020-09-13 – 2020-09-15 (×3): 250 mg via ORAL

## 2020-09-12 MED ORDER — CIPROFLOXACIN HCL 500 MG PO TAB
500 mg | Freq: Every day | ORAL | 0 refills | Status: DC
Start: 2020-09-12 — End: 2020-09-13

## 2020-09-12 MED ORDER — RIFAXIMIN 550 MG PO TAB
550 mg | Freq: Two times a day (BID) | ORAL | 0 refills | Status: AC
Start: 2020-09-12 — End: ?
  Administered 2020-09-13 – 2020-09-15 (×6): 550 mg via ORAL

## 2020-09-12 MED ORDER — PANTOPRAZOLE 40 MG PO TBEC
40 mg | Freq: Two times a day (BID) | ORAL | 0 refills | Status: AC
Start: 2020-09-12 — End: ?
  Administered 2020-09-13 – 2020-09-15 (×5): 40 mg via ORAL

## 2020-09-12 MED ORDER — ALBUMIN, HUMAN 25 % IV SOLP
25 g | Freq: Once | INTRAVENOUS | 0 refills | Status: CP
Start: 2020-09-12 — End: ?
  Administered 2020-09-13: 01:00:00 25 g via INTRAVENOUS

## 2020-09-12 MED ORDER — ALBUMIN, HUMAN 25 % IV SOLP
25 g | Freq: Once | INTRAVENOUS | 0 refills | Status: DC
Start: 2020-09-12 — End: 2020-09-12

## 2020-09-12 MED ORDER — MIDODRINE 5 MG PO TAB
15 mg | Freq: Three times a day (TID) | ORAL | 0 refills | Status: AC
Start: 2020-09-12 — End: ?
  Administered 2020-09-13 – 2020-09-15 (×8): 15 mg via ORAL

## 2020-09-12 MED ORDER — ZINC SULFATE 50 MG ZINC (220 MG) PO CAP
220 mg | Freq: Every day | ORAL | 0 refills | Status: AC
Start: 2020-09-12 — End: ?
  Administered 2020-09-13 – 2020-09-15 (×3): 220 mg via ORAL

## 2020-09-12 MED ORDER — ONDANSETRON HCL 4 MG PO TAB
4 mg | ORAL | 0 refills | Status: AC | PRN
Start: 2020-09-12 — End: ?

## 2020-09-12 NOTE — Care Coordination-Inpatient
Hepatology clinic requesting admission for AKI. 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 Coming from home, lives an hour away. Hepatology fellow also aware.     Kristin Cheese, MD, Jerrel Ivory

## 2020-09-12 NOTE — Progress Notes
labs reviewed, patient with increase in creatinine-from baseline 1.3 to 2.8. Discussed plan with Dr. Ladona Ridgel. Patient notified of direct admit disposition. AOD, Dr. Damita Dunnings notified, bed placement [facility]) notified. Requested Hepatology consult upon admission. Patient will report to admissions until bed is available.

## 2020-09-13 ENCOUNTER — Encounter: Admit: 2020-09-13 | Discharge: 2020-09-13 | Payer: MEDICARE

## 2020-09-13 ENCOUNTER — Inpatient Hospital Stay: Admit: 2020-09-13 | Discharge: 2020-09-13 | Payer: MEDICARE

## 2020-09-13 DIAGNOSIS — Z01810 Encounter for preprocedural cardiovascular examination: Secondary | ICD-10-CM

## 2020-09-13 DIAGNOSIS — I729 Aneurysm of unspecified site: Secondary | ICD-10-CM

## 2020-09-13 DIAGNOSIS — I10 Essential (primary) hypertension: Secondary | ICD-10-CM

## 2020-09-13 DIAGNOSIS — R0600 Dyspnea, unspecified: Secondary | ICD-10-CM

## 2020-09-13 DIAGNOSIS — K7460 Unspecified cirrhosis of liver: Secondary | ICD-10-CM

## 2020-09-13 DIAGNOSIS — E669 Obesity, unspecified: Secondary | ICD-10-CM

## 2020-09-13 DIAGNOSIS — K760 Fatty (change of) liver, not elsewhere classified: Secondary | ICD-10-CM

## 2020-09-13 DIAGNOSIS — J329 Chronic sinusitis, unspecified: Secondary | ICD-10-CM

## 2020-09-13 DIAGNOSIS — E119 Type 2 diabetes mellitus without complications: Secondary | ICD-10-CM

## 2020-09-13 DIAGNOSIS — K5730 Diverticulosis of large intestine without perforation or abscess without bleeding: Secondary | ICD-10-CM

## 2020-09-13 LAB — MAGNESIUM: Lab: 2.2 mg/dL — ABNORMAL LOW (ref >60)

## 2020-09-13 MED ADMIN — ALBUMIN, HUMAN 25 % IV SOLP [8981]: 12.5 g | INTRAVENOUS | @ 20:00:00 | Stop: 2020-09-13 | NDC 13533068471

## 2020-09-13 MED ADMIN — DULOXETINE 30 MG PO CPDR [93424]: 30 mg | ORAL | @ 13:00:00 | NDC 00904645361

## 2020-09-14 MED ADMIN — ALBUMIN, HUMAN 25 % IV SOLP [8981]: 50 g | INTRAVENOUS | @ 01:00:00 | Stop: 2020-09-17 | NDC 00944049302

## 2020-09-14 MED ADMIN — MAGNESIUM CITRATE PO SOLN [4711]: 148 mL | ORAL | @ 16:00:00 | Stop: 2020-09-14 | NDC 00904678744

## 2020-09-14 MED ADMIN — POLYETHYLENE GLYCOL 3350 17 GRAM PO PWPK [25424]: 17 g | ORAL | @ 22:00:00 | NDC 00904693186

## 2020-09-14 MED ADMIN — SODIUM CHLORIDE 0.9 % IV SOLP [27838]: 250 mL | INTRAVENOUS | @ 15:00:00 | Stop: 2020-09-14 | NDC 00338004902

## 2020-09-14 MED ADMIN — ALBUMIN, HUMAN 25 % IV SOLP [8981]: 50 g | INTRAVENOUS | @ 14:00:00 | Stop: 2020-09-14 | NDC 00944049302

## 2020-09-14 MED ADMIN — DULOXETINE 30 MG PO CPDR [93424]: 30 mg | ORAL | @ 14:00:00 | NDC 00904645361

## 2020-09-15 ENCOUNTER — Encounter: Admit: 2020-09-15 | Discharge: 2020-09-15 | Payer: MEDICARE

## 2020-09-15 MED ADMIN — DULOXETINE 30 MG PO CPDR [93424]: 30 mg | ORAL | @ 15:00:00 | Stop: 2020-09-15 | NDC 00904645361

## 2020-09-15 MED ADMIN — POLYETHYLENE GLYCOL 3350 17 GRAM PO PWPK [25424]: 17 g | ORAL | @ 02:00:00 | NDC 00904693186

## 2020-09-17 ENCOUNTER — Encounter: Admit: 2020-09-17 | Discharge: 2020-09-17 | Payer: MEDICARE

## 2020-09-17 MED ORDER — CIPROFLOXACIN HCL 500 MG PO TAB
500 mg | ORAL_TABLET | Freq: Every day | ORAL | 3 refills | 10.00000 days | Status: AC
Start: 2020-09-17 — End: ?
  Filled 2020-09-19: qty 90, 90d supply, fill #1

## 2020-09-19 ENCOUNTER — Encounter: Admit: 2020-09-19 | Discharge: 2020-09-19 | Payer: MEDICARE

## 2020-09-19 ENCOUNTER — Ambulatory Visit: Admit: 2020-09-19 | Discharge: 2020-09-19 | Payer: MEDICARE

## 2020-09-19 DIAGNOSIS — J329 Chronic sinusitis, unspecified: Secondary | ICD-10-CM

## 2020-09-19 DIAGNOSIS — I10 Essential (primary) hypertension: Secondary | ICD-10-CM

## 2020-09-19 DIAGNOSIS — D500 Iron deficiency anemia secondary to blood loss (chronic): Secondary | ICD-10-CM

## 2020-09-19 DIAGNOSIS — K7460 Unspecified cirrhosis of liver: Secondary | ICD-10-CM

## 2020-09-19 DIAGNOSIS — Z01810 Encounter for preprocedural cardiovascular examination: Secondary | ICD-10-CM

## 2020-09-19 DIAGNOSIS — D649 Anemia, unspecified: Secondary | ICD-10-CM

## 2020-09-19 DIAGNOSIS — K31819 Angiodysplasia of stomach and duodenum without bleeding: Secondary | ICD-10-CM

## 2020-09-19 DIAGNOSIS — E669 Obesity, unspecified: Secondary | ICD-10-CM

## 2020-09-19 DIAGNOSIS — E119 Type 2 diabetes mellitus without complications: Secondary | ICD-10-CM

## 2020-09-19 DIAGNOSIS — I729 Aneurysm of unspecified site: Secondary | ICD-10-CM

## 2020-09-19 DIAGNOSIS — K760 Fatty (change of) liver, not elsewhere classified: Secondary | ICD-10-CM

## 2020-09-19 DIAGNOSIS — K5730 Diverticulosis of large intestine without perforation or abscess without bleeding: Secondary | ICD-10-CM

## 2020-09-19 DIAGNOSIS — R0600 Dyspnea, unspecified: Secondary | ICD-10-CM

## 2020-09-19 LAB — CBC AND DIFF
Lab: 0 10*3/uL (ref 0–0.20)
Lab: 0 10*3/uL (ref 0–0.45)
Lab: 0.7 10*3/uL (ref 0–0.80)
Lab: 0.7 10*3/uL — ABNORMAL LOW (ref 1.0–4.8)
Lab: 1 % (ref 0–2)
Lab: 1 % (ref 0–5)
Lab: 10 % (ref 4–12)
Lab: 10 % — ABNORMAL LOW (ref 24–44)
Lab: 19 % — ABNORMAL HIGH (ref 11–15)
Lab: 2.4 M/UL — ABNORMAL LOW (ref 4.0–5.0)
Lab: 20 % — ABNORMAL LOW (ref 36–45)
Lab: 236 K/UL (ref 150–400)
Lab: 27 pg — ABNORMAL HIGH (ref 26–34)
Lab: 33 g/dL — ABNORMAL LOW (ref 32.0–36.0)
Lab: 5.4 10*3/uL (ref 1.8–7.0)
Lab: 6.7 g/dL — ABNORMAL LOW (ref 12.0–15.0)
Lab: 7.1 10*3/uL (ref 4.5–11.0)
Lab: 78 % — ABNORMAL HIGH (ref >60)
Lab: 8.9 FL — ABNORMAL LOW (ref >60)
Lab: 82 FL — ABNORMAL HIGH (ref 80–100)

## 2020-09-19 LAB — COMPREHENSIVE METABOLIC PANEL
Lab: 1.8 mg/dL — ABNORMAL HIGH (ref 0.4–1.00)
Lab: 134 MMOL/L — ABNORMAL LOW (ref 137–147)
Lab: 4.8 MMOL/L (ref 3.5–5.1)
Lab: 48 mg/dL — ABNORMAL HIGH (ref 7–25)
Lab: 92 mg/dL (ref 70–100)

## 2020-09-19 LAB — PROTIME INR (PT): Lab: 1.6 MMOL/L — ABNORMAL HIGH (ref 0.8–1.2)

## 2020-09-20 ENCOUNTER — Encounter: Admit: 2020-09-20 | Discharge: 2020-09-20 | Payer: MEDICARE

## 2020-09-20 MED ORDER — MIDODRINE 10 MG PO TAB
ORAL_TABLET | Freq: Three times a day (TID) | 1 refills
Start: 2020-09-20 — End: ?

## 2020-09-21 ENCOUNTER — Encounter: Admit: 2020-09-21 | Discharge: 2020-09-21 | Payer: MEDICARE

## 2020-09-21 ENCOUNTER — Ambulatory Visit: Admit: 2020-09-21 | Discharge: 2020-09-21 | Payer: MEDICARE

## 2020-09-21 DIAGNOSIS — D500 Iron deficiency anemia secondary to blood loss (chronic): Secondary | ICD-10-CM

## 2020-09-21 DIAGNOSIS — D508 Other iron deficiency anemias: Secondary | ICD-10-CM

## 2020-09-21 DIAGNOSIS — K31819 Angiodysplasia of stomach and duodenum without bleeding: Secondary | ICD-10-CM

## 2020-09-21 NOTE — Progress Notes
Pt arrived to [facility] treatment room. VSS, PIV placed. Coralee North contacted for orders. 2 units RBC transfused w/o complication, VSS throughout. PIV removed per protocol. AVS denied. Pt left unit via wheelchair in stable condition with husband.

## 2020-09-22 ENCOUNTER — Encounter: Admit: 2020-09-22 | Discharge: 2020-09-22 | Payer: MEDICARE

## 2020-09-22 ENCOUNTER — Ambulatory Visit: Admit: 2020-09-22 | Discharge: 2020-09-22 | Payer: MEDICARE

## 2020-09-22 DIAGNOSIS — K31819 Angiodysplasia of stomach and duodenum without bleeding: Secondary | ICD-10-CM

## 2020-09-22 DIAGNOSIS — R188 Other ascites: Secondary | ICD-10-CM

## 2020-09-22 DIAGNOSIS — K7460 Unspecified cirrhosis of liver: Secondary | ICD-10-CM

## 2020-09-22 DIAGNOSIS — D500 Iron deficiency anemia secondary to blood loss (chronic): Secondary | ICD-10-CM

## 2020-09-22 DIAGNOSIS — K760 Fatty (change of) liver, not elsewhere classified: Secondary | ICD-10-CM

## 2020-09-22 DIAGNOSIS — K7469 Other cirrhosis of liver: Secondary | ICD-10-CM

## 2020-09-22 MED ORDER — ALBUMIN, HUMAN 25 % IV SOLP
0 refills | Status: CP
Start: 2020-09-22 — End: ?
  Administered 2020-09-22 (×2): 12.5 g via INTRAVENOUS

## 2020-09-22 NOTE — Other
Immediate Post Procedure Note    Date:  09/22/2020                                         Attending Physician:   Peter Garter, MD  Performing Provider:  Rene Paci, MD    Consent:  Consent obtained from patient.  Time out performed: Consent obtained, correct patient verified, correct procedure verified, correct site verified, patient marked as necessary.  Pre/Post Procedure Diagnosis:  Cirrhosis of liver with ascites, unspecified  Indications:  Cirrhosis of liver with ascites, unspecified      Procedure(s):  Ultrasound guided diagnostic and therapeutic paracentesis  Findings:  Ultrasound guided diagnostic and therapeutic paracentesis     Estimated Blood Loss:  None/Negligible  Specimen(s) Removed/Disposition: Clear tan fluid. Yes, sent to pathology  Complications: None  Patient Tolerated Procedure: Well  Post-Procedure Condition:  stable    Rene Paci, MD

## 2020-09-22 NOTE — H&P (View-Only)
Pre Procedure History and Physical/Sedation Plan-OP    Procedure Date: 09/22/2020     Planned Procedure(s):  US guided paracentesis     Indication for exam:  Ascites   __________________________________________________________________    Chief Complaint:  See above     History of Present Illness: Kristin Moody is a 64 y.o. female that presents to IR today for paracentesis. Patient reports feeling well on exam today except for abdominal distention.     Patient Active Problem List    Diagnosis Date Noted   ? AKI (acute kidney injury) (HCC) 09/12/2020   ? Pleurodynia 06/04/2020   ? Pneumothorax 06/04/2020   ? 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 Hypotension 02/10/2020   ? 'Light-for-dates' infant with signs of fetal malnutrition 01/22/2020   ? 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 Anemia 09/28/2019   ? Encephalopathy 08/12/2019   ? Spasticity 08/10/2019   ? Hyperreflexia 08/10/2019   ? 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 Right leg weakness 05/14/2019   ? 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 Iron (Fe) deficiency anemia 01/19/2019   ? Preop cardiovascular exam 01/07/2019   ? Pre-transplant evaluation for liver transplant 01/06/2019   ? Cirrhosis (HCC) 12/16/2018   ? Acute on chronic anemia 12/16/2018   ? Lactic acidosis 12/16/2018   ? GI bleed 10/24/2018   ? Portal hypertension (HCC) 08/28/2018   ? Confusion 08/27/2018   ? Dyspnea 08/27/2018   ? Chronic abdominal pain 08/27/2018   ? 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 Acute on chronic blood loss anemia 08/27/2018   ? Iron deficiency anemia 04/13/2018   ? Pneumonia due to infectious organism 04/13/2018   ? Melena 04/10/2018   ? Esophageal varices without bleeding (HCC) 02/25/2018   ? 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 Lower abdominal pain 10/24/2014   ? Chest discomfort 09/21/2014   ? Essential hypertension 08/13/2012   ? Abnormal liver function tests 05/13/2011   ? Fatty liver disease, nonalcoholic 05/13/2011   ? Obesity (BMI 30-39.9) 05/13/2011   ? Slow transit constipation 05/13/2011     Medical History:   Diagnosis Date   ? Aneurysm (HCC)    ? 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 Diverticulosis of colon     descending and sigmoid colon   ? Dyspnea    ? Essential hypertension 08/13/2012   ? Fatty infiltration of liver    ? HTN (hypertension)    ? Obesity (BMI 30-39.9) 05/13/2011   ? Preop cardiovascular exam 01/07/2019   ? Sinus infection    ? Type 2 diabetes mellitus (HCC) 09/21/2014      Surgical History:   Procedure Laterality Date   ? HYSTERECTOMY  1994   ? UPPER GASTROINTESTINAL ENDOSCOPY  2009   ? COLONOSCOPY  2009   ? CYSTOCELE REPAIR  2009    with endocele repair   ? RECTOCELE REPAIR  03/2009   ? LIVER BIOPSY  07/17/2010   ? COLONOSCOPY N/A 11/08/2017    Performed by Dawna Part, MD at [facility] ENDO   ? ESOPHAGOGASTRODUODENOSCOPY N/A 11/08/2017    Performed by Dawna Part, MD at [facility] ENDO   ? ESOPHAGOGASTRODUODENOSCOPY WITH BIOPSY - FLEXIBLE  11/08/2017    Performed by Dawna Part, MD at [facility] ENDO   ? COLONOSCOPY WITH HOT BIOPSY FORCEPS REMOVAL TUMOR/ POLYP/ OTHER LESION  11/08/2017  Performed by Dawna Part, MD at [facility] ENDO   ? ESOPHAGOGASTRODUODENOSCOPY WITH BAND LIGATION ESOPHAGEAL/ GASTRIC VARICES - FLEXIBLE N/A 04/10/2018    Performed by Normajean Baxter, MD at [facility] ENDO   ? ESOPHAGOGASTRODUODENOSCOPY WITH BIOPSY - FLEXIBLE N/A 04/10/2018    Performed by Normajean Baxter, MD at [facility] ENDO   ? ESOPHAGOGASTRODUODENOSCOPY WITH CONTROL OF BLEEDING - FLEXIBLE N/A 04/25/2018    Performed by Jolee Ewing, MD at [facility] ENDO   ? ESOPHAGOGASTRODUODENOSCOPY WITH BIOPSY - FLEXIBLE with push enteroscopy N/A 08/28/2018    Performed by Celesta Gentile, MD at [facility] ENDO   ? EGD N/A 10/27/2018    Performed by Eliott Nine, MD at [facility] ENDO   ? ESOPHAGOGASTRODUODENOSCOPY WITH SNARE REMOVAL TUMOR/ POLYP/ OTHER LESION - FLEXIBLE N/A 10/27/2018    Performed by Eliott Nine, MD at [facility] ENDO   ? ESOPHAGOGASTRODUODENOSCOPY WITH BIOPSY - FLEXIBLE N/A 12/16/2018    Performed by Buckles, Vinnie Level, MD at [facility] ENDO   ? ESOPHAGOGASTRODUODENOSCOPY WITH CONTROL OF BLEEDING - FLEXIBLE N/A 12/16/2018    Performed by Buckles, Vinnie Level, MD at [facility] ENDO   ? ESOPHAGOGASTRODUODENOSCOPY WITH SPECIMEN COLLECTION BY BRUSHING/ WASHING N/A 01/22/2019    Performed by Veneta Penton, MD at [facility] ENDO   ? ESOPHAGOGASTRODUODENOSCOPY WITH DILATION ESOPHAGUS WITH BALLOON 30 MM OR GREATER - FLEXIBLE N/A 01/22/2019    Performed by Veneta Penton, MD at [facility] ENDO   ? ESOPHAGOGASTRODUODENOSCOPY WITH BIOPSY - FLEXIBLE N/A 01/22/2019    Performed by Veneta Penton, MD at [facility] ENDO   ? ESOPHAGOGASTRODUODENOSCOPY WITH BIOPSY - FLEXIBLE N/A 06/19/2019    Performed by Dawna Part, MD at [facility] ENDO   ? ESOPHAGOGASTRODUODENOSCOPY [WITH APC]  WITH SPECIMEN COLLECTION BY BRUSHING/ WASHING N/A 08/07/2019    Performed by Dawna Part, MD at [facility] ENDO   ? ESOPHAGOGASTRODUODENOSCOPY WITH SPECIMEN COLLECTION BY BRUSHING/ WASHING N/A 09/10/2019    Performed by Buckles, Vinnie Level, MD at [facility] ENDO   ? ESOPHAGOGASTRODUODENOSCOPY WITH CONTROL OF BLEEDING - FLEXIBLE N/A 09/10/2019    Performed by Buckles, Vinnie Level, MD at [facility] ENDO   ? ESOPHAGOGASTRODUODENOSCOPY WITH CONTROL OF BLEEDING - FLEXIBLE N/A 02/12/2020    Performed by Lenor Derrick, MD at [facility] ENDO   ? ESOPHAGOGASTRODUODENOSCOPY WITH BIOPSY - FLEXIBLE N/A 02/26/2020    Performed by Buckles, Vinnie Level, MD at [facility] ENDO   ? ESOPHAGOGASTRODUODENOSCOPY WITH BIOPSY - FLEXIBLE N/A 05/18/2020    Performed by Lenor Derrick, MD at [facility] ENDO   ? ESOPHAGOGASTRODUODENOSCOPY WITH CONTROL OF BLEEDING - FLEXIBLE N/A 05/18/2020    Performed by Lenor Derrick, MD at [facility] ENDO   ? ESOPHAGOGASTRODUODENOSCOPY WITH CONTROL OF BLEEDING - FLEXIBLE N/A 07/08/2020    Performed by Vertell Novak, MD at [facility] ENDO   ? EGD N/A 08/05/2020    Performed by Vertell Novak, MD at [facility] ENDO   ? ESOPHAGOGASTRODUODENOSCOPY WITH CONTROL OF BLEEDING - FLEXIBLE N/A 08/05/2020    Performed by Vertell Novak, MD at [facility] ENDO   ? CHOLECYSTECTOMY     ? COLONOSCOPY     ? ESOPHAGOGASTRIC FUNDOPLICATION  2003, 2004    laparoscopic   ? ESOPHAGOGASTRIC FUNDOPLICATION     ? HX HYSTERECTOMY     ? PR ESOPHAGOSCOPY FLEXIBLE TRANSORAL DIAGNOSTIC        Medications Prior to Admission   Medication Sig Dispense Refill Last Dose   ? abaloparatide (TYMLOS) 80 mcg (3,120 mcg/1.56 mL) injection pen Inject eighty mcg under the skin daily. Administer  into the periumbilical region of the abdomen while sitting or lying down in case of orthostatic hypotension. 3.12 mL 3    ? ciprofloxacin (CIPRO) 500 mg tablet Take one tablet by mouth daily. 90 tablet 3    ? duloxetine DR (CYMBALTA) 60 mg capsule Take 60 mg by mouth daily.      ? lidocaine (ASPERCREME PATCH) 4 % topical patch Apply one each topically to affected area daily. 5 each 1    ? midodrine (PROAMATINE) 10 mg tablet TAKE 1 TABLET BY MOUTH THREE TIMES A DAY 90 tablet 1    ? ondansetron HCL (ZOFRAN) 4 mg tablet Take one tablet by mouth every 6 hours as needed for Nausea or Vomiting. 120 tablet 1    ? oxyCODONE (ROXICODONE) 5 mg tablet Take one tablet by mouth every 6 hours as needed 20 tablet 0    ? pantoprazole DR (PROTONIX) 40 mg tablet TAKE 1 TABLET BY MOUTH TWICE A DAY 180 tablet 1    ? polyethylene glycol 3350 (MIRALAX) 17 g packet Take one packet by mouth twice daily.      ? promethazine (PHENERGAN) 25 mg tablet Take 25 mg by mouth every 4 hours as needed. Just uses it with Tramadol      ? rifAXIMin (XIFAXAN) 550 mg tablet Take one tablet by mouth twice daily. 180 tablet 3    ? zinc sulfate 220 mg (50 mg elemental zinc) capsule Take one capsule by mouth daily. 90 capsule 3      Allergies   Allergen Reactions   ? Tegaderm BLISTERS   ? Adhesive Tape (Rosins) RASH     Other reaction(s): irritated skin   ? Sulfa (Sulfonamide Antibiotics) RASH     Other reaction(s): Unknown Reaction   ? Codeine NAUSEA AND VOMITING   ? Morphine SEE COMMENTS     Pt reports burns her veins  Other reaction(s): Unknown Reaction   ? Penicillin G SEE COMMENTS     Throat issues, blisters in throat    ? Tramadol NAUSEA ONLY and ITCHING     Takes this medication regularly        Social History:   Social History     Tobacco Use   ? Smoking status: Never Smoker   ? Smokeless tobacco: Never Used   Substance Use Topics   ? Alcohol use: No      Family History   Problem Relation Age of Onset   ? Other Mother    ? Kidney Failure Mother    ? Osteoporosis Mother    ? Cancer Father         esophageal   ? Liver Disease Sister         endstage, s/p liver transplant   ? Cancer Brother         tonsil, liver    ? Hip Fracture Neg Hx         Review of Systems  Constitutional: negative for fevers and chills  Respiratory: negative for cough or dyspnea  Gastrointestinal: positive for abdominal distention    Previous Anesthetic/Sedation History:  NA.       Code Status: Prior    Physical Exam:  Vital Signs: Last Filed In 24 Hours Vital Signs: 24 Hour Range   BP: 105/44 (11/03 1343)  Temp: 37.1 ?C (98.8 ?F) (11/03 1343)  Pulse: 98 (11/04 0920)  Respirations: 23 PER MINUTE (11/04 0920)  SpO2: 98 % (11/04 0920)  SpO2 Pulse: 101 (11/04 0920) BP: (97-106)/(39-47)  Temp:  [36.3 ?C (97.4 ?F)-37.1 ?C (98.8 ?F)]   Pulse:  [93-99]   Respirations:  [18 PER MINUTE-23 PER MINUTE]   SpO2:  [96 %-100 %]             General appearance: alert and no distress  Neurologic: Grossly normal, at baseline  Lungs: Nonlabored with normal effort  Abdomen: soft, non-tender; distended      Sedation/Medication Plan: Lidocaine  Discussion/Reviews:  Physician has discussed risks and alternatives of this type of sedation and above planned procedures with patient  NPO Status: Acceptable  Pregnancy Status: N/A        Lab/Radiology/Other Diagnostic Tests:  Labs:  Pertinent labs reviewed           Velora Mediate, APRN-NP  Pager [phone]

## 2020-09-22 NOTE — Progress Notes
Pt verbalized understanding of discharge. Pt educated on incisional care. All questions answered. Pt's belongings sent with pt. All vital signs stable, discharge criteria met. Pt transported via wheelchair to waiting room to meet husband and go to first floor liver Dr.

## 2020-09-22 NOTE — Patient Instructions
IR-PARACENTESIS   A paracentesis is the removal of an abnormal buildup of fluid in your abdominal cavity. This fluid buildup is called ascites and may be caused by conditions such as liver disease, heart failure, or cancer. During this procedure, a needle is inserted into your abdomen to drain the fluid. The fluid may then be sent to the lab for testing if medically indicated. Removal of the fluid may also relieve belly pressure and shortness of breath caused by the ascites.?  POST-PROCEDURE PAIN:   ? Pain control following your procedure is a priority for both you and your Physicians.  ? Some soreness or tenderness at the site is to be expected for several days. We recommend taking over the counter analgesics to help relieve this pain.  ? Alternative methods for pain relief include but not limited to heat or cold compress, relaxation techniques, rest, and changing of positions.  ? If pain continues after 5-7 days or you have severe pain not relieved by medication, please contact us as directed below.?  POST-PROCEDURE ACTIVITY:   ? A responsible adult must drive you home.  ? If you receive sedation, narcotic pain medication or anesthesia for the procedure, you should not drive or operate heavy machinery or do anything that requires concentration for at least 24 hours after procedure completion.  ? It is recommended that a responsible adult be with you until morning.?  POST-PROCEDURE SITE CARE:   ? You will have a small bandage over the procedure site. Keep this dry.  ? You may remove it in 24 hours.  ? You may shower in 24 hours, after removing the bandage.  ? A dry gauze bandage may be reapplied as necessary to protect your clothing as the site may sometimes leak for several days after the procedure.  ? Do not submerge the procedure site for 1 week (no bathtub, swimming, hot tub, etc.)  ? Do not use ointments, creams or powders on the puncture site.  ? Be sure your hands are clean when touching near the site.?  DIET/MEDICATIONS:   ? You may resume your previous diet after the procedure.  ? If you receive sedation or narcotic pain medications, avoid any foods or beverages containing alcohol for at least 24 hours after the procedure.  ? Please see the Medication Reconciliation sheet for instructions regarding resuming your home medications.?  CALL THE DOCTOR IF:   ? Bright red blood soaks the bandage.  ? You have pain not relieved by medication. Some soreness at the site is to be expected.  ? You have signs of infection such as: fever greater than 101F, chills, redness, warmth, swelling, drainage or pus from the puncture site.  For any of the above symptoms or for problems or concerns related to the procedure performed at the [facility], call [phone] Monday-Friday from 7-5p. After-hours and weekends, please call [phone] and ask for the Interventional Radiology Resident on-call.   You or your caregiver should call 911 for any severe symptoms such as excessive bleeding, severe dizziness, trouble breathing or loss of consciousness.   ?  ?

## 2020-09-25 ENCOUNTER — Encounter: Admit: 2020-09-25 | Discharge: 2020-09-25 | Payer: MEDICARE

## 2020-09-25 DIAGNOSIS — K31819 Angiodysplasia of stomach and duodenum without bleeding: Secondary | ICD-10-CM

## 2020-09-25 DIAGNOSIS — I8510 Secondary esophageal varices without bleeding: Secondary | ICD-10-CM

## 2020-09-25 DIAGNOSIS — K7460 Unspecified cirrhosis of liver: Secondary | ICD-10-CM

## 2020-09-26 ENCOUNTER — Encounter: Admit: 2020-09-26 | Discharge: 2020-09-26 | Payer: MEDICARE

## 2020-09-26 ENCOUNTER — Ambulatory Visit: Admit: 2020-09-26 | Discharge: 2020-09-27 | Payer: MEDICARE

## 2020-09-26 ENCOUNTER — Ambulatory Visit: Admit: 2020-09-26 | Discharge: 2020-09-26 | Payer: MEDICARE

## 2020-09-26 DIAGNOSIS — K760 Fatty (change of) liver, not elsewhere classified: Secondary | ICD-10-CM

## 2020-09-26 DIAGNOSIS — R0600 Dyspnea, unspecified: Secondary | ICD-10-CM

## 2020-09-26 DIAGNOSIS — I729 Aneurysm of unspecified site: Secondary | ICD-10-CM

## 2020-09-26 DIAGNOSIS — E669 Obesity, unspecified: Secondary | ICD-10-CM

## 2020-09-26 DIAGNOSIS — E119 Type 2 diabetes mellitus without complications: Secondary | ICD-10-CM

## 2020-09-26 DIAGNOSIS — K7460 Unspecified cirrhosis of liver: Secondary | ICD-10-CM

## 2020-09-26 DIAGNOSIS — D649 Anemia, unspecified: Secondary | ICD-10-CM

## 2020-09-26 DIAGNOSIS — Z01810 Encounter for preprocedural cardiovascular examination: Secondary | ICD-10-CM

## 2020-09-26 DIAGNOSIS — K5730 Diverticulosis of large intestine without perforation or abscess without bleeding: Secondary | ICD-10-CM

## 2020-09-26 DIAGNOSIS — I10 Essential (primary) hypertension: Secondary | ICD-10-CM

## 2020-09-26 DIAGNOSIS — K31819 Angiodysplasia of stomach and duodenum without bleeding: Secondary | ICD-10-CM

## 2020-09-26 DIAGNOSIS — J329 Chronic sinusitis, unspecified: Secondary | ICD-10-CM

## 2020-09-26 DIAGNOSIS — D500 Iron deficiency anemia secondary to blood loss (chronic): Secondary | ICD-10-CM

## 2020-09-26 LAB — COMPREHENSIVE METABOLIC PANEL
Lab: 120 mg/dL — ABNORMAL HIGH (ref 70–100)
Lab: 137 MMOL/L (ref 137–147)
Lab: 33 mL/min — ABNORMAL LOW (ref >60)
Lab: 4.7 MMOL/L (ref 3.5–5.1)

## 2020-09-26 LAB — CBC
Lab: 2.8 M/UL — ABNORMAL LOW (ref 4.0–5.0)
Lab: 20 % — ABNORMAL HIGH (ref 11–15)
Lab: 205 10*3/uL (ref 150–400)
Lab: 24 % — ABNORMAL LOW (ref 36–45)
Lab: 27 pg (ref 26–34)
Lab: 31 g/dL — ABNORMAL LOW (ref 32.0–36.0)
Lab: 5.9 10*3/uL (ref 4.5–11.0)
Lab: 7.6 g/dL — ABNORMAL LOW (ref 12.0–15.0)
Lab: 8.2 FL (ref 7–11)
Lab: 85 FL — ABNORMAL HIGH (ref 80–100)

## 2020-09-26 LAB — PROTIME INR (PT): Lab: 1.6 — ABNORMAL HIGH (ref 0.8–1.2)

## 2020-09-26 NOTE — Patient Instructions
09/26/2020 clinic visit    Schedule/Orders  Return to clinic in 4 weeks in person with Dr. Ladona Ridgel or Coralee North, LTX      Back Office Requests        Patient instructions:  1. We will look into home physical therapy for you.  2. If vomiting blood, or have black tarry stools, please make sure to come to [place] ER  3. Dr. Ladona Ridgel will see you on Wednesday morning before your transfusion.   4. Please make sure to buy protein shakes  5. vitamin A and d with next labs  6. We will call Endocrinology to follow up on osteoporosis medication   7. We will schedule you for colonoscopy at a later date   8. Dr. Ladona Ridgel will work you in to be seen in 4 weeks with himself or Coralee North    PLEASE ensure you notify me when labs and imaging have been completed outside of .  If you have an outside hospital admission, ER visit, or experience any significant change to your health status, it is important to communicate this information to me.  Updated records help to ensure we are providing the best quality care to you.  Thank you for your cooperation in this matter.     ? Is patHow to reach [place]:   Please send a MyChart message to the Transplant Hepatology clinic or call us at [phone] and ask for Beverely Risen, RN.  ? How to get a medication refill:  Please use the MyChart Refill request or contact your pharmacy directly to request medication refills. Please allow 48 hours.     ? How to receive your test results:  Results are released to your My-Chart account after 3 business days, please ensure you are utilizing this tool to review results after your clinic visit.  If you don't have a My-Chart account, you will receive a phone call or letter.  If you are expecting results and have not heard from our office within 1 week of your testing, please call me!   ? Scheduling:  Our Scheduling phone number is [phone].  ? Appointment Reminders on your cell phone: Make sure we have your cell phone number, and Text [place] to [phone].    Support groups for many chronic illnesses are available through [place]: [email or web address] or 270-163-2005.

## 2020-09-27 ENCOUNTER — Encounter: Admit: 2020-09-27 | Discharge: 2020-09-27 | Payer: MEDICARE

## 2020-09-28 ENCOUNTER — Ambulatory Visit: Admit: 2020-09-28 | Discharge: 2020-09-28 | Payer: MEDICARE

## 2020-09-28 ENCOUNTER — Encounter: Admit: 2020-09-28 | Discharge: 2020-09-28 | Payer: MEDICARE

## 2020-09-28 DIAGNOSIS — K31819 Angiodysplasia of stomach and duodenum without bleeding: Secondary | ICD-10-CM

## 2020-09-28 DIAGNOSIS — K7031 Alcoholic cirrhosis of liver with ascites: Secondary | ICD-10-CM

## 2020-09-28 DIAGNOSIS — D62 Acute posthemorrhagic anemia: Secondary | ICD-10-CM

## 2020-09-28 DIAGNOSIS — K760 Fatty (change of) liver, not elsewhere classified: Secondary | ICD-10-CM

## 2020-09-28 NOTE — Progress Notes
Pt. arrived to [facility] infusion room for 2 units of RBCs transfusion. VSS, PIV placed and assessment complete; please refer to doc flowsheet for details. Blood transfused without incident. No transfusion hypersensitivity reaction noted. VSS throughout infusions, please refer to doc flowsheet. PIV removed per protocol. All questions and concerns addressed. Copy of AVS provided. Pt. left unit with husband via wheelchair in stable condition.

## 2020-09-29 ENCOUNTER — Encounter: Admit: 2020-09-29 | Discharge: 2020-09-29 | Payer: MEDICARE

## 2020-09-29 ENCOUNTER — Ambulatory Visit: Admit: 2020-09-29 | Discharge: 2020-09-29 | Payer: MEDICARE

## 2020-09-29 DIAGNOSIS — K31819 Angiodysplasia of stomach and duodenum without bleeding: Principal | ICD-10-CM

## 2020-09-29 DIAGNOSIS — R188 Other ascites: Secondary | ICD-10-CM

## 2020-09-29 DIAGNOSIS — K760 Fatty (change of) liver, not elsewhere classified: Secondary | ICD-10-CM

## 2020-09-29 DIAGNOSIS — K7460 Unspecified cirrhosis of liver: Secondary | ICD-10-CM

## 2020-09-29 LAB — GRAM STAIN

## 2020-09-29 MED ORDER — ALBUMIN, HUMAN 25 % IV SOLP
0 refills | Status: CP
Start: 2020-09-29 — End: ?
  Administered 2020-09-29 (×3): 12.5 g via INTRAVENOUS

## 2020-09-29 MED ORDER — LACTULOSE 10 GRAM/15 ML PO LIQUID GROUP
20 g | ORAL | 1 refills | Status: CN | PRN
Start: 2020-09-29 — End: ?

## 2020-09-29 MED ORDER — MIDAZOLAM 1 MG/ML IJ SOLN
1 mg | Freq: Once | INTRAVENOUS | 0 refills | Status: DC
Start: 2020-09-29 — End: 2020-09-29

## 2020-09-29 MED ORDER — LACTULOSE 10 GRAM/15 ML PO SOLN
30 g | Freq: Three times a day (TID) | ORAL | 11 refills | 21.00000 days | Status: AC
Start: 2020-09-29 — End: ?
  Filled 2020-09-29: qty 1892, 14d supply, fill #1

## 2020-09-29 NOTE — Other
Immediate Post Procedure Note    Date:  09/29/2020                                         Attending Physician: Ronny Flurry, MD  Operating Physician: Janalyn Harder, M.D.    Consent:  Consent obtained from patient.  Time out performed: Consent obtained, correct patient verified, correct procedure verified, correct site verified, patient marked as necessary.  Pre/Post Procedure Diagnosis/Indication:  ascites    Anesthesia: local only  Procedure(s):  US Guided paracentesis. Please see separate PACS dictation for further detail.  Findings: large volume ascites    Estimated Blood Loss:  None/Negligible  Specimen(s) Removed/Disposition:  ascites  Complications: None  Patient Tolerated Procedure: Well  Post-Procedure Condition:  Stable

## 2020-09-29 NOTE — Patient Instructions
IR-PARACENTESIS   A paracentesis is the removal of an abnormal buildup of fluid in your abdominal cavity. This fluid buildup is called ascites and may be caused by conditions such as liver disease, heart failure, or cancer. During this procedure, a needle is inserted into your abdomen to drain the fluid. The fluid may then be sent to the lab for testing if medically indicated. Removal of the fluid may also relieve belly pressure and shortness of breath caused by the ascites.?  POST-PROCEDURE PAIN:   ? Pain control following your procedure is a priority for both you and your Physicians.  ? Some soreness or tenderness at the site is to be expected for several days. We recommend taking over the counter analgesics to help relieve this pain.  ? Alternative methods for pain relief include but not limited to heat or cold compress, relaxation techniques, rest, and changing of positions.  ? If pain continues after 5-7 days or you have severe pain not relieved by medication, please contact us as directed below.?  POST-PROCEDURE ACTIVITY:   ? A responsible adult must drive you home.  ? If you receive sedation, narcotic pain medication or anesthesia for the procedure, you should not drive or operate heavy machinery or do anything that requires concentration for at least 24 hours after procedure completion.  ? It is recommended that a responsible adult be with you until morning.?  POST-PROCEDURE SITE CARE:   ? You will have a small bandage over the procedure site. Keep this dry.  ? You may remove it in 24 hours.  ? You may shower in 24 hours, after removing the bandage.  ? A dry gauze bandage may be reapplied as necessary to protect your clothing as the site may sometimes leak for several days after the procedure.  ? Do not submerge the procedure site for 1 week (no bathtub, swimming, hot tub, etc.)  ? Do not use ointments, creams or powders on the puncture site.  ? Be sure your hands are clean when touching near the site.?  DIET/MEDICATIONS:   ? You may resume your previous diet after the procedure.  ? If you receive sedation or narcotic pain medications, avoid any foods or beverages containing alcohol for at least 24 hours after the procedure.  ? Please see the Medication Reconciliation sheet for instructions regarding resuming your home medications.?  CALL THE DOCTOR IF:   ? Bright red blood soaks the bandage.  ? You have pain not relieved by medication. Some soreness at the site is to be expected.  ? You have signs of infection such as: fever greater than 101F, chills, redness, warmth, swelling, drainage or pus from the puncture site.  For any of the above symptoms or for problems or concerns related to the procedure performed at the [facility], call [phone] Monday-Friday from 7-5p. After-hours and weekends, please call [phone] and ask for the Interventional Radiology Resident on-call.   You or your caregiver should call 911 for any severe symptoms such as excessive bleeding, severe dizziness, trouble breathing or loss of consciousness.   ?  ?

## 2020-09-29 NOTE — H&P (View-Only)
Pre Procedure History and Physical/Sedation Plan-OP    Procedure Date: 09/29/2020     Planned Procedure(s):  US guided paracentesis     Indication for exam:  Ascites   __________________________________________________________________    Chief Complaint:  See above     History of Present Illness: Kristin Moody is a 64 y.o. female that presents to IR today for paracentesis. Patient reports feeling well on exam today except for abdominal distention.     Patient Active Problem List    Diagnosis Date Noted   ? AKI (acute kidney injury) (HCC) 09/12/2020   ? Pleurodynia 06/04/2020   ? Pneumothorax 06/04/2020   ? 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 Hypotension 02/10/2020   ? 'Light-for-dates' infant with signs of fetal malnutrition 01/22/2020   ? 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 Anemia 09/28/2019   ? Encephalopathy 08/12/2019   ? Spasticity 08/10/2019   ? Hyperreflexia 08/10/2019   ? 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 Right leg weakness 05/14/2019   ? 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 Iron (Fe) deficiency anemia 01/19/2019   ? Preop cardiovascular exam 01/07/2019   ? Pre-transplant evaluation for liver transplant 01/06/2019   ? Cirrhosis (HCC) 12/16/2018   ? Acute on chronic anemia 12/16/2018   ? Lactic acidosis 12/16/2018   ? GI bleed 10/24/2018   ? Portal hypertension (HCC) 08/28/2018   ? Confusion 08/27/2018   ? Dyspnea 08/27/2018   ? Chronic abdominal pain 08/27/2018   ? 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 Acute on chronic blood loss anemia 08/27/2018   ? Iron deficiency anemia 04/13/2018   ? Pneumonia due to infectious organism 04/13/2018   ? Melena 04/10/2018   ? Esophageal varices without bleeding (HCC) 02/25/2018   ? 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 Lower abdominal pain 10/24/2014   ? Chest discomfort 09/21/2014   ? Essential hypertension 08/13/2012   ? Abnormal liver function tests 05/13/2011   ? Fatty liver disease, nonalcoholic 05/13/2011   ? Obesity (BMI 30-39.9) 05/13/2011   ? Slow transit constipation 05/13/2011     Medical History:   Diagnosis Date   ? Aneurysm (HCC)    ? 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 Diverticulosis of colon     descending and sigmoid colon   ? Dyspnea    ? Essential hypertension 08/13/2012   ? Fatty infiltration of liver    ? HTN (hypertension)    ? Obesity (BMI 30-39.9) 05/13/2011   ? Preop cardiovascular exam 01/07/2019   ? Sinus infection    ? Type 2 diabetes mellitus (HCC) 09/21/2014      Surgical History:   Procedure Laterality Date   ? HYSTERECTOMY  1994   ? UPPER GASTROINTESTINAL ENDOSCOPY  2009   ? COLONOSCOPY  2009   ? CYSTOCELE REPAIR  2009    with endocele repair   ? RECTOCELE REPAIR  03/2009   ? LIVER BIOPSY  07/17/2010   ? COLONOSCOPY N/A 11/08/2017    Performed by Dawna Part, MD at [facility] ENDO   ? ESOPHAGOGASTRODUODENOSCOPY N/A 11/08/2017    Performed by Dawna Part, MD at [facility] ENDO   ? ESOPHAGOGASTRODUODENOSCOPY WITH BIOPSY - FLEXIBLE  11/08/2017    Performed by Dawna Part, MD at [facility] ENDO   ? COLONOSCOPY WITH HOT BIOPSY FORCEPS REMOVAL TUMOR/ POLYP/ OTHER LESION  11/08/2017  Performed by Dawna Part, MD at [facility] ENDO   ? ESOPHAGOGASTRODUODENOSCOPY WITH BAND LIGATION ESOPHAGEAL/ GASTRIC VARICES - FLEXIBLE N/A 04/10/2018    Performed by Normajean Baxter, MD at [facility] ENDO   ? ESOPHAGOGASTRODUODENOSCOPY WITH BIOPSY - FLEXIBLE N/A 04/10/2018    Performed by Normajean Baxter, MD at [facility] ENDO   ? ESOPHAGOGASTRODUODENOSCOPY WITH CONTROL OF BLEEDING - FLEXIBLE N/A 04/25/2018    Performed by Jolee Ewing, MD at [facility] ENDO   ? ESOPHAGOGASTRODUODENOSCOPY WITH BIOPSY - FLEXIBLE with push enteroscopy N/A 08/28/2018    Performed by Celesta Gentile, MD at [facility] ENDO   ? EGD N/A 10/27/2018    Performed by Eliott Nine, MD at [facility] ENDO   ? ESOPHAGOGASTRODUODENOSCOPY WITH SNARE REMOVAL TUMOR/ POLYP/ OTHER LESION - FLEXIBLE N/A 10/27/2018    Performed by Eliott Nine, MD at [facility] ENDO   ? ESOPHAGOGASTRODUODENOSCOPY WITH BIOPSY - FLEXIBLE N/A 12/16/2018    Performed by Buckles, Vinnie Level, MD at [facility] ENDO   ? ESOPHAGOGASTRODUODENOSCOPY WITH CONTROL OF BLEEDING - FLEXIBLE N/A 12/16/2018    Performed by Buckles, Vinnie Level, MD at [facility] ENDO   ? ESOPHAGOGASTRODUODENOSCOPY WITH SPECIMEN COLLECTION BY BRUSHING/ WASHING N/A 01/22/2019    Performed by Veneta Penton, MD at [facility] ENDO   ? ESOPHAGOGASTRODUODENOSCOPY WITH DILATION ESOPHAGUS WITH BALLOON 30 MM OR GREATER - FLEXIBLE N/A 01/22/2019    Performed by Veneta Penton, MD at [facility] ENDO   ? ESOPHAGOGASTRODUODENOSCOPY WITH BIOPSY - FLEXIBLE N/A 01/22/2019    Performed by Veneta Penton, MD at [facility] ENDO   ? ESOPHAGOGASTRODUODENOSCOPY WITH BIOPSY - FLEXIBLE N/A 06/19/2019    Performed by Dawna Part, MD at [facility] ENDO   ? ESOPHAGOGASTRODUODENOSCOPY [WITH APC]  WITH SPECIMEN COLLECTION BY BRUSHING/ WASHING N/A 08/07/2019    Performed by Dawna Part, MD at [facility] ENDO   ? ESOPHAGOGASTRODUODENOSCOPY WITH SPECIMEN COLLECTION BY BRUSHING/ WASHING N/A 09/10/2019    Performed by Buckles, Vinnie Level, MD at [facility] ENDO   ? ESOPHAGOGASTRODUODENOSCOPY WITH CONTROL OF BLEEDING - FLEXIBLE N/A 09/10/2019    Performed by Buckles, Vinnie Level, MD at [facility] ENDO   ? ESOPHAGOGASTRODUODENOSCOPY WITH CONTROL OF BLEEDING - FLEXIBLE N/A 02/12/2020    Performed by Lenor Derrick, MD at [facility] ENDO   ? ESOPHAGOGASTRODUODENOSCOPY WITH BIOPSY - FLEXIBLE N/A 02/26/2020    Performed by Buckles, Vinnie Level, MD at [facility] ENDO   ? ESOPHAGOGASTRODUODENOSCOPY WITH BIOPSY - FLEXIBLE N/A 05/18/2020    Performed by Lenor Derrick, MD at [facility] ENDO   ? ESOPHAGOGASTRODUODENOSCOPY WITH CONTROL OF BLEEDING - FLEXIBLE N/A 05/18/2020    Performed by Lenor Derrick, MD at [facility] ENDO   ? ESOPHAGOGASTRODUODENOSCOPY WITH CONTROL OF BLEEDING - FLEXIBLE N/A 07/08/2020    Performed by Vertell Novak, MD at [facility] ENDO   ? EGD N/A 08/05/2020    Performed by Vertell Novak, MD at [facility] ENDO   ? ESOPHAGOGASTRODUODENOSCOPY WITH CONTROL OF BLEEDING - FLEXIBLE N/A 08/05/2020    Performed by Vertell Novak, MD at [facility] ENDO   ? CHOLECYSTECTOMY     ? COLONOSCOPY     ? ESOPHAGOGASTRIC FUNDOPLICATION  2003, 2004    laparoscopic   ? ESOPHAGOGASTRIC FUNDOPLICATION     ? HX HYSTERECTOMY     ? PR ESOPHAGOSCOPY FLEXIBLE TRANSORAL DIAGNOSTIC        Medications Prior to Admission   Medication Sig Dispense Refill Last Dose   ? abaloparatide (TYMLOS) 80 mcg (3,120 mcg/1.56 mL) injection pen Inject eighty mcg under the skin daily. Administer  into the periumbilical region of the abdomen while sitting or lying down in case of orthostatic hypotension. 3.12 mL 3 09/29/2020   ? ciprofloxacin (CIPRO) 500 mg tablet Take one tablet by mouth daily. 90 tablet 3 09/29/2020   ? duloxetine DR (CYMBALTA) 60 mg capsule Take 60 mg by mouth daily.   09/29/2020   ? lidocaine (ASPERCREME PATCH) 4 % topical patch Apply one each topically to affected area daily. 5 each 1 09/29/2020   ? midodrine (PROAMATINE) 10 mg tablet TAKE 1 TABLET BY MOUTH THREE TIMES A DAY 90 tablet 1 09/29/2020   ? ondansetron HCL (ZOFRAN) 4 mg tablet Take one tablet by mouth every 6 hours as needed for Nausea or Vomiting. 120 tablet 1 09/29/2020   ? oxyCODONE (ROXICODONE) 5 mg tablet Take one tablet by mouth every 6 hours as needed 20 tablet 0 09/29/2020   ? pantoprazole DR (PROTONIX) 40 mg tablet TAKE 1 TABLET BY MOUTH TWICE A DAY 180 tablet 1 09/29/2020   ? polyethylene glycol 3350 (MIRALAX) 17 g packet Take one packet by mouth twice daily.   09/29/2020   ? promethazine (PHENERGAN) 25 mg tablet Take 25 mg by mouth every 4 hours as needed. Just uses it with Tramadol   09/29/2020   ? rifAXIMin (XIFAXAN) 550 mg tablet Take one tablet by mouth twice daily. 180 tablet 3 09/29/2020   ? zinc sulfate 220 mg (50 mg elemental zinc) capsule Take one capsule by mouth daily. 90 capsule 3 09/29/2020     Allergies   Allergen Reactions   ? Tegaderm BLISTERS   ? Adhesive Tape (Rosins) RASH     Other reaction(s): irritated skin ? Sulfa (Sulfonamide Antibiotics) RASH     Other reaction(s): Unknown Reaction   ? Codeine NAUSEA AND VOMITING   ? Morphine SEE COMMENTS     Pt reports burns her veins  Other reaction(s): Unknown Reaction   ? Penicillin G SEE COMMENTS     Throat issues, blisters in throat    ? Tramadol NAUSEA ONLY and ITCHING     Takes this medication regularly        Social History:   Social History     Tobacco Use   ? Smoking status: Never Smoker   ? Smokeless tobacco: Never Used   Substance Use Topics   ? Alcohol use: No      Family History   Problem Relation Age of Onset   ? Other Mother    ? Kidney Failure Mother    ? Osteoporosis Mother    ? Cancer Father         esophageal   ? Liver Disease Sister         endstage, s/p liver transplant   ? Cancer Brother         tonsil, liver    ? Hip Fracture Neg Hx         Review of Systems  Constitutional: negative for fevers and chills  Respiratory: negative for cough or dyspnea  Gastrointestinal: negative for nausea, vomiting and abdominal pain    Previous Anesthetic/Sedation History:  NA.     Code Status: Prior    Physical Exam:  Vital Signs: Last Filed In 24 Hours Vital Signs: 24 Hour Range   BP: 112/58 (11/11 0947)  Temp: 36.5 ?C (97.7 ?F) (11/10 1552)  Pulse: (P) 101 (11/11 0947)  Respirations: 20 PER MINUTE (11/10 1552)  SpO2: 98 % (11/11 0947)  SpO2 Pulse: 102 (11/11 0947) BP: (101-121)/(41-67)  Temp:  [36.3 ?C (97.4 ?F)-36.7 ?C (98.1 ?F)]   Pulse:  [94-101]   Respirations:  [18 PER MINUTE-20 PER MINUTE]   SpO2:  [98 %-100 %]             General appearance: alert and no distress  Neurologic: Grossly normal, at baseline  Lungs: Nonlabored with normal effort  Abdomen: soft, non-tender, distended    Sedation/Medication Plan: Lidocaine  Discussion/Reviews:  Physician has discussed risks and alternatives of this type of sedation and above planned procedures with patient  NPO Status: Acceptable  Pregnancy Status: N/A      Lab/Radiology/Other Diagnostic Tests:  Labs:  Pertinent labs reviewed           Velora Mediate, APRN-NP  Pager [phone]

## 2020-09-30 NOTE — Progress Notes
Interventional Radiology Outpatient Scheduling Checklist    1.Name of Procedure(s):Paracentesis    2.Date of Procedure:11/18, 11/23 and 10/20/2020    3.Arrival Time:0900    4.Procedure Time:1000    5.Correct Procedural Room Assignment:IR BH #[id number]    6.Blood Thinners Triaged and instructed per protocol: Y/N/NA: NA  Confirmed accurate instructions sent to patient: Y/N: NA     7.Procedure Order Verified: Y/N: Yes    9.Patient instructed to have a driver: Y/N/NA: Yes    10.Patient instructed on NPO status: Y/N/NA: NO Restrictions.  Confirmed accurate instructions sent to patient: Y/N: Yes    11.Specimen needed: Y/N/NA: Yes   Verified Order placed: Y/N: Yes    12.Allergies Verified:Y/N:Yes    13.Is there an Iodine Allergy: Y/N:No  Does the Procedure Require contrast: Y/N:NO  If so, was the IR- Contrast Allergy Pre-Procedure Medication protocol ordered: Y/NA: NA    14.Does the patient have labs according to IR Pre-procedure Laboratory Parameter policy: Y/N/NA: Yes  If No, was the patient instructed to obtain labs prior to procedure: Y/N/NA: NA     15.Will the patient need to be admitted or have a possible admission: Y/N: No  If yes, confirmed accurate instructions sent to patient: Y/N/NA: NA     16.Patient States Understanding:Y/N: Yes    17.History of OSA:Y/N: No  If yes, confirm request to bring CPAP sent to patient: Y/N/NA: NA    18. Patient declines electronic procedure instructions: Y/N: No

## 2020-09-30 NOTE — Patient Education
DearMs Dechellis,    Thank you for choosing [facility] Interventional Radiology for your procedure. Your appointment information is listed below:    Appointment Date:11/18, 11/23 and 10/20/2020  Appointment Time:10AM  Arrival Time:9AM  Location:      [place], [place], [place]  Parking: [address] Park[address] [place]  PRE-PROCEDURE INSTRUCTIONS LOCAL    You are scheduled for a procedure in Interventional Radiology. Please follow these instructions and any direction from your Primary Care/Managing Physician. If you have questions about your procedure or need to reschedule please call [phone].      Medication Instructions: Continue scheduled medication.     Diet Instructions: Maintain regular diet with no restrictions.     Day of Exam Instructions:  1. Bathe or shower with an antibacterial soap prior to your appointment.  2. Bring a list of your current medications and the dosages.  3. Wear comfortable clothing and leave valuables at home.  4. Arrive (1) hour prior to your appointment. This time will be spent registering, interviewing, assessing, educating, and preparing you for the test.   You will be with [place] anywhere from 30 minutes to 2 hours after your exam depending on your procedure.  5. Depending on the procedure and as instructed by the nurse a responsible adult may be required to drive you home (no [place], taxis or buses are allowed). If a driver is required and you do not have one we will be unable to perform your procedure.   6. The nurse will give you discharge instructions about your care and activities after the procedure.

## 2020-10-03 ENCOUNTER — Ambulatory Visit: Admit: 2020-10-03 | Discharge: 2020-10-03 | Payer: MEDICARE

## 2020-10-03 ENCOUNTER — Encounter: Admit: 2020-10-03 | Discharge: 2020-10-03 | Payer: MEDICARE

## 2020-10-03 DIAGNOSIS — K7469 Other cirrhosis of liver: Secondary | ICD-10-CM

## 2020-10-03 DIAGNOSIS — D508 Other iron deficiency anemias: Secondary | ICD-10-CM

## 2020-10-03 DIAGNOSIS — D649 Anemia, unspecified: Secondary | ICD-10-CM

## 2020-10-03 DIAGNOSIS — R945 Abnormal results of liver function studies: Secondary | ICD-10-CM

## 2020-10-03 DIAGNOSIS — K31819 Angiodysplasia of stomach and duodenum without bleeding: Secondary | ICD-10-CM

## 2020-10-03 DIAGNOSIS — D500 Iron deficiency anemia secondary to blood loss (chronic): Secondary | ICD-10-CM

## 2020-10-03 DIAGNOSIS — R710 Precipitous drop in hematocrit: Secondary | ICD-10-CM

## 2020-10-03 DIAGNOSIS — K7460 Unspecified cirrhosis of liver: Secondary | ICD-10-CM

## 2020-10-03 DIAGNOSIS — K760 Fatty (change of) liver, not elsewhere classified: Secondary | ICD-10-CM

## 2020-10-03 LAB — CBC AND DIFF
Lab: 0.1 K/UL (ref 0–0.20)
Lab: 0.3 K/UL (ref 0–0.45)
Lab: 0.8 K/UL — ABNORMAL LOW (ref 1.0–4.8)
Lab: 0.9 K/UL — ABNORMAL HIGH (ref 0–0.80)
Lab: 1 % (ref 0–2)
Lab: 12 % — ABNORMAL LOW (ref 24–44)
Lab: 14 % — ABNORMAL HIGH (ref 4–12)
Lab: 2.9 M/UL — ABNORMAL LOW (ref 4.0–5.0)
Lab: 21 % — ABNORMAL HIGH (ref >60)
Lab: 215 K/UL — ABNORMAL LOW (ref >60)
Lab: 25 % — ABNORMAL LOW (ref 36–45)
Lab: 27 pg (ref 26–34)
Lab: 31 g/dL — ABNORMAL LOW (ref 32.0–36.0)
Lab: 4.7 K/UL (ref 1.8–7.0)
Lab: 5 % (ref 0–5)
Lab: 68 % (ref 41–77)
Lab: 7 K/UL — ABNORMAL HIGH (ref 4.5–11.0)
Lab: 8.2 g/dL — ABNORMAL LOW (ref 12.0–15.0)
Lab: 8.5 FL (ref 7–11)
Lab: 86 FL — ABNORMAL LOW (ref 80–100)

## 2020-10-03 LAB — COMPREHENSIVE METABOLIC PANEL
Lab: 1.7 mg/dL — ABNORMAL HIGH (ref 0.4–1.00)
Lab: 122 mg/dL — ABNORMAL HIGH (ref 70–100)
Lab: 138 MMOL/L (ref 137–147)
Lab: 4.3 MMOL/L (ref 3.5–5.1)
Lab: 5.7 g/dL — ABNORMAL LOW (ref 6.0–8.0)
Lab: 55 mg/dL — ABNORMAL HIGH (ref 7–25)
Lab: 8 mg/dL — ABNORMAL LOW (ref 8.5–10.6)

## 2020-10-03 LAB — PROTIME INR (PT): Lab: 1.6 MMOL/L — ABNORMAL HIGH (ref 0.8–1.2)

## 2020-10-04 ENCOUNTER — Encounter: Admit: 2020-10-04 | Discharge: 2020-10-04 | Payer: MEDICARE

## 2020-10-04 DIAGNOSIS — Z0389 Encounter for observation for other suspected diseases and conditions ruled out: Secondary | ICD-10-CM

## 2020-10-04 DIAGNOSIS — K31819 Angiodysplasia of stomach and duodenum without bleeding: Secondary | ICD-10-CM

## 2020-10-04 DIAGNOSIS — K7581 Nonalcoholic steatohepatitis (NASH): Secondary | ICD-10-CM

## 2020-10-05 ENCOUNTER — Encounter: Admit: 2020-10-05 | Discharge: 2020-10-05 | Payer: MEDICARE

## 2020-10-05 ENCOUNTER — Ambulatory Visit: Admit: 2020-10-05 | Discharge: 2020-10-05 | Payer: MEDICARE

## 2020-10-05 DIAGNOSIS — K31819 Angiodysplasia of stomach and duodenum without bleeding: Secondary | ICD-10-CM

## 2020-10-06 ENCOUNTER — Ambulatory Visit: Admit: 2020-10-06 | Discharge: 2020-10-06 | Payer: MEDICARE

## 2020-10-06 ENCOUNTER — Encounter: Admit: 2020-10-06 | Discharge: 2020-10-06 | Payer: MEDICARE

## 2020-10-06 DIAGNOSIS — K760 Fatty (change of) liver, not elsewhere classified: Secondary | ICD-10-CM

## 2020-10-06 DIAGNOSIS — R188 Other ascites: Secondary | ICD-10-CM

## 2020-10-06 DIAGNOSIS — K7460 Unspecified cirrhosis of liver: Secondary | ICD-10-CM

## 2020-10-06 DIAGNOSIS — K31819 Angiodysplasia of stomach and duodenum without bleeding: Principal | ICD-10-CM

## 2020-10-06 LAB — GRAM STAIN

## 2020-10-06 MED ORDER — ALBUMIN, HUMAN 25 % IV SOLP
0 refills | Status: CP
Start: 2020-10-06 — End: ?
  Administered 2020-10-06: 17:00:00 37.5 g via INTRAVENOUS

## 2020-10-06 NOTE — Progress Notes
Patient is familiar with this procedure and has no questions.

## 2020-10-06 NOTE — Other
Immediate Post Procedure Note    Date:  10/06/2020                                         Attending Physician:   Derrek Gu    Procedure(s):  paracentesis  Pre/Post Diagnosis:  ascites  Description/Findings:  Serous yellow aspirate  Anesthesia:  1% lidocaine    Time out performed: Consent obtained, correct patient verified, correct procedure verified, correct site verified, patient marked as necessary.  Estimated Blood Loss:  None/Negligible  Specimen(s) Removed/Disposition:  Yes, sent to pathology  Complications: None    Kathlynn Grate, MD

## 2020-10-06 NOTE — Patient Instructions
IR-PARACENTESIS   A paracentesis is the removal of an abnormal buildup of fluid in your abdominal cavity. This fluid buildup is called ascites and may be caused by conditions such as liver disease, heart failure, or cancer. During this procedure, a needle is inserted into your abdomen to drain the fluid. The fluid may then be sent to the lab for testing if medically indicated. Removal of the fluid may also relieve belly pressure and shortness of breath caused by the ascites.?  POST-PROCEDURE PAIN:   ? Pain control following your procedure is a priority for both you and your Physicians.  ? Some soreness or tenderness at the site is to be expected for several days. We recommend taking over the counter analgesics to help relieve this pain.  ? Alternative methods for pain relief include but not limited to heat or cold compress, relaxation techniques, rest, and changing of positions.  ? If pain continues after 5-7 days or you have severe pain not relieved by medication, please contact us as directed below.?  POST-PROCEDURE ACTIVITY:   ? A responsible adult must drive you home.  ? If you receive sedation, narcotic pain medication or anesthesia for the procedure, you should not drive or operate heavy machinery or do anything that requires concentration for at least 24 hours after procedure completion.  ? It is recommended that a responsible adult be with you until morning.?  POST-PROCEDURE SITE CARE:   ? You will have a small bandage over the procedure site. Keep this dry.  ? You may remove it in 24 hours.  ? You may shower in 24 hours, after removing the bandage.  ? A dry gauze bandage may be reapplied as necessary to protect your clothing as the site may sometimes leak for several days after the procedure.  ? Do not submerge the procedure site for 1 week (no bathtub, swimming, hot tub, etc.)  ? Do not use ointments, creams or powders on the puncture site.  ? Be sure your hands are clean when touching near the site.?  DIET/MEDICATIONS:   ? You may resume your previous diet after the procedure.  ? If you receive sedation or narcotic pain medications, avoid any foods or beverages containing alcohol for at least 24 hours after the procedure.  ? Please see the Medication Reconciliation sheet for instructions regarding resuming your home medications.?  CALL THE DOCTOR IF:   ? Bright red blood soaks the bandage.  ? You have pain not relieved by medication. Some soreness at the site is to be expected.  ? You have signs of infection such as: fever greater than 101F, chills, redness, warmth, swelling, drainage or pus from the puncture site.  For any of the above symptoms or for problems or concerns related to the procedure performed at the [facility], call [phone] Monday-Friday from 7-5p. After-hours and weekends, please call [phone] and ask for the Interventional Radiology Resident on-call.   You or your caregiver should call 911 for any severe symptoms such as excessive bleeding, severe dizziness, trouble breathing or loss of consciousness.   ?  ?

## 2020-10-06 NOTE — H&P (View-Only)
Pre Procedure History and Physical/Sedation Plan    Procedure Date: 10/06/2020     Planned Procedure(s):  U/S guided diagnostic/therapeutic paracentesis  Indication: Ascites  __________________________________________________________________    Chief Complaint: Ascites    History of Present Illness: Kristin Moody is a 64 y.o. female. Pt presents to IR for paracentesis. Pt complains of abdominal distention.  Patient Active Problem List    Diagnosis Date Noted   ? AKI (acute kidney injury) (HCC) 09/12/2020   ? Pleurodynia 06/04/2020   ? Pneumothorax 06/04/2020   ? 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 Hypotension 02/10/2020   ? 'Light-for-dates' infant with signs of fetal malnutrition 01/22/2020   ? 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 Anemia 09/28/2019   ? Encephalopathy 08/12/2019   ? Spasticity 08/10/2019   ? Hyperreflexia 08/10/2019   ? 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 Right leg weakness 05/14/2019   ? 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 Iron (Fe) deficiency anemia 01/19/2019   ? Preop cardiovascular exam 01/07/2019   ? Pre-transplant evaluation for liver transplant 01/06/2019   ? Cirrhosis (HCC) 12/16/2018   ? Acute on chronic anemia 12/16/2018   ? Lactic acidosis 12/16/2018   ? GI bleed 10/24/2018   ? Portal hypertension (HCC) 08/28/2018   ? Confusion 08/27/2018   ? Dyspnea 08/27/2018   ? Chronic abdominal pain 08/27/2018   ? 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 Acute on chronic blood loss anemia 08/27/2018   ? Iron deficiency anemia 04/13/2018   ? Pneumonia due to infectious organism 04/13/2018   ? Melena 04/10/2018   ? Esophageal varices without bleeding (HCC) 02/25/2018   ? 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 Lower abdominal pain 10/24/2014   ? Chest discomfort 09/21/2014   ? Essential hypertension 08/13/2012   ? Abnormal liver function tests 05/13/2011   ? Fatty liver disease, nonalcoholic 05/13/2011   ? Obesity (BMI 30-39.9) 05/13/2011   ? Slow transit constipation 05/13/2011     Medical History:   Diagnosis Date   ? Aneurysm (HCC)    ? 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 Diverticulosis of colon     descending and sigmoid colon   ? Dyspnea    ? Essential hypertension 08/13/2012   ? Fatty infiltration of liver    ? HTN (hypertension)    ? Obesity (BMI 30-39.9) 05/13/2011   ? Preop cardiovascular exam 01/07/2019   ? Sinus infection    ? Type 2 diabetes mellitus (HCC) 09/21/2014      Surgical History:   Procedure Laterality Date   ? HYSTERECTOMY  1994   ? UPPER GASTROINTESTINAL ENDOSCOPY  2009   ? COLONOSCOPY  2009   ? CYSTOCELE REPAIR  2009    with endocele repair   ? RECTOCELE REPAIR  03/2009   ? LIVER BIOPSY  07/17/2010   ? COLONOSCOPY N/A 11/08/2017    Performed by Dawna Part, MD at [facility] ENDO   ? ESOPHAGOGASTRODUODENOSCOPY N/A 11/08/2017    Performed by Dawna Part, MD at [facility] ENDO   ? ESOPHAGOGASTRODUODENOSCOPY WITH BIOPSY - FLEXIBLE  11/08/2017    Performed by Dawna Part, MD at [facility] ENDO   ? COLONOSCOPY WITH HOT BIOPSY FORCEPS REMOVAL TUMOR/ POLYP/ OTHER LESION  11/08/2017    Performed by Dawna Part, MD at [facility] ENDO   ? ESOPHAGOGASTRODUODENOSCOPY WITH BAND LIGATION ESOPHAGEAL/  GASTRIC VARICES - FLEXIBLE N/A 04/10/2018    Performed by Normajean Baxter, MD at [facility] ENDO   ? ESOPHAGOGASTRODUODENOSCOPY WITH BIOPSY - FLEXIBLE N/A 04/10/2018    Performed by Normajean Baxter, MD at [facility] ENDO   ? ESOPHAGOGASTRODUODENOSCOPY WITH CONTROL OF BLEEDING - FLEXIBLE N/A 04/25/2018    Performed by Jolee Ewing, MD at [facility] ENDO   ? ESOPHAGOGASTRODUODENOSCOPY WITH BIOPSY - FLEXIBLE with push enteroscopy N/A 08/28/2018    Performed by Celesta Gentile, MD at [facility] ENDO   ? EGD N/A 10/27/2018    Performed by Eliott Nine, MD at [facility] ENDO   ? ESOPHAGOGASTRODUODENOSCOPY WITH SNARE REMOVAL TUMOR/ POLYP/ OTHER LESION - FLEXIBLE N/A 10/27/2018    Performed by Eliott Nine, MD at [facility] ENDO   ? ESOPHAGOGASTRODUODENOSCOPY WITH BIOPSY - FLEXIBLE N/A 12/16/2018    Performed by Buckles, Vinnie Level, MD at [facility] ENDO   ? ESOPHAGOGASTRODUODENOSCOPY WITH CONTROL OF BLEEDING - FLEXIBLE N/A 12/16/2018    Performed by Buckles, Vinnie Level, MD at [facility] ENDO   ? ESOPHAGOGASTRODUODENOSCOPY WITH SPECIMEN COLLECTION BY BRUSHING/ WASHING N/A 01/22/2019    Performed by Veneta Penton, MD at [facility] ENDO   ? ESOPHAGOGASTRODUODENOSCOPY WITH DILATION ESOPHAGUS WITH BALLOON 30 MM OR GREATER - FLEXIBLE N/A 01/22/2019    Performed by Veneta Penton, MD at [facility] ENDO   ? ESOPHAGOGASTRODUODENOSCOPY WITH BIOPSY - FLEXIBLE N/A 01/22/2019    Performed by Veneta Penton, MD at [facility] ENDO   ? ESOPHAGOGASTRODUODENOSCOPY WITH BIOPSY - FLEXIBLE N/A 06/19/2019    Performed by Dawna Part, MD at [facility] ENDO   ? ESOPHAGOGASTRODUODENOSCOPY [WITH APC]  WITH SPECIMEN COLLECTION BY BRUSHING/ WASHING N/A 08/07/2019    Performed by Dawna Part, MD at [facility] ENDO   ? ESOPHAGOGASTRODUODENOSCOPY WITH SPECIMEN COLLECTION BY BRUSHING/ WASHING N/A 09/10/2019    Performed by Buckles, Vinnie Level, MD at [facility] ENDO   ? ESOPHAGOGASTRODUODENOSCOPY WITH CONTROL OF BLEEDING - FLEXIBLE N/A 09/10/2019    Performed by Buckles, Vinnie Level, MD at [facility] ENDO   ? ESOPHAGOGASTRODUODENOSCOPY WITH CONTROL OF BLEEDING - FLEXIBLE N/A 02/12/2020    Performed by Lenor Derrick, MD at [facility] ENDO   ? ESOPHAGOGASTRODUODENOSCOPY WITH BIOPSY - FLEXIBLE N/A 02/26/2020    Performed by Buckles, Vinnie Level, MD at [facility] ENDO   ? ESOPHAGOGASTRODUODENOSCOPY WITH BIOPSY - FLEXIBLE N/A 05/18/2020    Performed by Lenor Derrick, MD at [facility] ENDO   ? ESOPHAGOGASTRODUODENOSCOPY WITH CONTROL OF BLEEDING - FLEXIBLE N/A 05/18/2020    Performed by Lenor Derrick, MD at [facility] ENDO   ? ESOPHAGOGASTRODUODENOSCOPY WITH CONTROL OF BLEEDING - FLEXIBLE N/A 07/08/2020    Performed by Vertell Novak, MD at [facility] ENDO   ? EGD N/A 08/05/2020    Performed by Vertell Novak, MD at [facility] ENDO   ? ESOPHAGOGASTRODUODENOSCOPY WITH CONTROL OF BLEEDING - FLEXIBLE N/A 08/05/2020    Performed by Vertell Novak, MD at [facility] ENDO   ? CHOLECYSTECTOMY     ? COLONOSCOPY     ? ESOPHAGOGASTRIC FUNDOPLICATION  2003, 2004    laparoscopic   ? ESOPHAGOGASTRIC FUNDOPLICATION     ? HX HYSTERECTOMY     ? PR ESOPHAGOSCOPY FLEXIBLE TRANSORAL DIAGNOSTIC        Medications Prior to Admission   Medication Sig Dispense Refill Last Dose   ? abaloparatide (TYMLOS) 80 mcg (3,120 mcg/1.56 mL) injection pen Inject eighty mcg under the skin daily. Administer into the periumbilical region of the abdomen while sitting or lying down in case of orthostatic hypotension.  3.12 mL 3    ? ciprofloxacin (CIPRO) 500 mg tablet Take one tablet by mouth daily. 90 tablet 3    ? duloxetine DR (CYMBALTA) 60 mg capsule Take 60 mg by mouth daily.      ? lactulose (GENERLAC) 10 gram/15 mL oral solution Take 45 mL by mouth three times daily. Titrate to 3-4 BMs/day 1892 mL 11    ? lidocaine (ASPERCREME PATCH) 4 % topical patch Apply one each topically to affected area daily. 5 each 1    ? midodrine (PROAMATINE) 10 mg tablet TAKE 1 TABLET BY MOUTH THREE TIMES A DAY 90 tablet 1    ? ondansetron HCL (ZOFRAN) 4 mg tablet Take one tablet by mouth every 6 hours as needed for Nausea or Vomiting. 120 tablet 1    ? oxyCODONE (ROXICODONE) 5 mg tablet Take one tablet by mouth every 6 hours as needed 20 tablet 0    ? pantoprazole DR (PROTONIX) 40 mg tablet TAKE 1 TABLET BY MOUTH TWICE A DAY 180 tablet 1    ? polyethylene glycol 3350 (MIRALAX) 17 g packet Take one packet by mouth twice daily.      ? promethazine (PHENERGAN) 25 mg tablet Take 25 mg by mouth every 4 hours as needed. Just uses it with Tramadol      ? rifAXIMin (XIFAXAN) 550 mg tablet Take one tablet by mouth twice daily. 180 tablet 3    ? zinc sulfate 220 mg (50 mg elemental zinc) capsule Take one capsule by mouth daily. 90 capsule 3      Allergies   Allergen Reactions   ? Tegaderm BLISTERS   ? Adhesive Tape (Rosins) RASH     Other reaction(s): irritated skin   ? Sulfa (Sulfonamide Antibiotics) RASH     Other reaction(s): Unknown Reaction   ? Codeine NAUSEA AND VOMITING   ? Morphine SEE COMMENTS     Pt reports burns her veins  Other reaction(s): Unknown Reaction   ? Penicillin G SEE COMMENTS     Throat issues, blisters in throat    ? Tramadol NAUSEA ONLY and ITCHING     Takes this medication regularly        Social History:   Social History     Tobacco Use   ? Smoking status: Never Smoker   ? Smokeless tobacco: Never Used   Substance Use Topics   ? Alcohol use: No      Family History   Problem Relation Age of Onset   ? Other Mother    ? Kidney Failure Mother    ? Osteoporosis Mother    ? Cancer Father         esophageal   ? Liver Disease Sister         endstage, s/p liver transplant   ? Cancer Brother         tonsil, liver    ? Hip Fracture Neg Hx         Review of Systems  Positive abdominal distention    Previous Anesthetic/Sedation History:  N/A-procedure done with local anesthetic    Physical Exam:  Vital Signs: Last Filed In 24 Hours Vital Signs: 24 Hour Range   BP: 121/59 (11/17 1142)  Temp: 36.9 ?C (98.4 ?F) (11/17 1142)  Pulse: 89 (11/17 1142)  Respirations: 18 PER MINUTE (11/17 1142)  SpO2: 97 % (11/17 1142) BP: (118-130)/(55-60)   Temp:  [36.7 ?C (98.1 ?F)-36.9 ?C (98.4 ?F)]   Pulse:  [89-90]   Respirations:  [  18 PER MINUTE]   SpO2:  [97 %-98 %]             General appearance: alert  Neurologic: Grossly normal  Lungs: Nonlabored  Abdomen: abnormal findings:  distended       Anesthesia Classification:  ASA III (A patient with a severe systemic disease that limits activity, but is not incapacitating)  Sedation/Medication Plan: Local anesthetic  Medications for Reversal: N/A-local anesthetic  Discussion/Reviews:  Physician has discussed risks and alternatives of this type of sedation and above planned procedures with patient  NPO Status: N/A Local anesthetic        Lab/Radiology/Other Diagnostic Tests:  Labs:  Pertinent labs reviewed           Almira Coaster, APRN-NP  Pager [phone]

## 2020-10-06 NOTE — Progress Notes
VSS, albumin infused and IV site dc'd without difficulty, she verbalizes understanding of home care.  RN will take her to the bathroom prior to dismissal but will leave per wheelchair in stable condition.  Driver to meet at hospital entrance.

## 2020-10-06 NOTE — Progress Notes
Discharge instructions given to husband, Dominica Severin, patient keeps asking the same question several times.  He reports she gets confused easily.  He understands plan of care for today.

## 2020-10-09 ENCOUNTER — Encounter: Admit: 2020-10-09 | Discharge: 2020-10-09 | Payer: MEDICARE

## 2020-10-09 DIAGNOSIS — R0600 Dyspnea, unspecified: Secondary | ICD-10-CM

## 2020-10-09 DIAGNOSIS — N189 Chronic kidney disease, unspecified: Secondary | ICD-10-CM

## 2020-10-09 DIAGNOSIS — K5730 Diverticulosis of large intestine without perforation or abscess without bleeding: Secondary | ICD-10-CM

## 2020-10-09 DIAGNOSIS — E119 Type 2 diabetes mellitus without complications: Secondary | ICD-10-CM

## 2020-10-09 DIAGNOSIS — K760 Fatty (change of) liver, not elsewhere classified: Secondary | ICD-10-CM

## 2020-10-09 DIAGNOSIS — I10 Essential (primary) hypertension: Secondary | ICD-10-CM

## 2020-10-09 DIAGNOSIS — E669 Obesity, unspecified: Secondary | ICD-10-CM

## 2020-10-09 DIAGNOSIS — J329 Chronic sinusitis, unspecified: Secondary | ICD-10-CM

## 2020-10-09 DIAGNOSIS — K31819 Angiodysplasia of stomach and duodenum without bleeding: Secondary | ICD-10-CM

## 2020-10-09 DIAGNOSIS — Z01810 Encounter for preprocedural cardiovascular examination: Secondary | ICD-10-CM

## 2020-10-09 DIAGNOSIS — K7031 Alcoholic cirrhosis of liver with ascites: Secondary | ICD-10-CM

## 2020-10-09 DIAGNOSIS — I729 Aneurysm of unspecified site: Secondary | ICD-10-CM

## 2020-10-09 DIAGNOSIS — K7460 Unspecified cirrhosis of liver: Secondary | ICD-10-CM

## 2020-10-09 NOTE — Telephone Encounter
Called patient & explained that she has been made Status 7. Explained that d/t her declining kidney function, she may meet criteria for a kidney transplant eval. Advised that she will be Status 7 while we get the plan in place and primary [place] will call with more info tomorrow. Pt v/u & states that she plans to complete labs in [facility] tomorrow.

## 2020-10-09 NOTE — Progress Notes
Patient made Status 7 per Dr. Lovena Le d/t consideration for kidney transplant work-up.

## 2020-10-10 ENCOUNTER — Encounter: Admit: 2020-10-10 | Discharge: 2020-10-10 | Payer: MEDICARE

## 2020-10-10 ENCOUNTER — Ambulatory Visit: Admit: 2020-10-10 | Discharge: 2020-10-11 | Payer: MEDICARE

## 2020-10-10 ENCOUNTER — Ambulatory Visit: Admit: 2020-10-10 | Discharge: 2020-10-10 | Payer: MEDICARE

## 2020-10-10 DIAGNOSIS — R4182 Altered mental status, unspecified: Secondary | ICD-10-CM

## 2020-10-10 DIAGNOSIS — K7581 Nonalcoholic steatohepatitis (NASH): Secondary | ICD-10-CM

## 2020-10-10 DIAGNOSIS — Z0389 Encounter for observation for other suspected diseases and conditions ruled out: Secondary | ICD-10-CM

## 2020-10-10 DIAGNOSIS — E669 Obesity, unspecified: Secondary | ICD-10-CM

## 2020-10-10 DIAGNOSIS — N952 Postmenopausal atrophic vaginitis: Secondary | ICD-10-CM

## 2020-10-10 DIAGNOSIS — E119 Type 2 diabetes mellitus without complications: Secondary | ICD-10-CM

## 2020-10-10 DIAGNOSIS — I10 Essential (primary) hypertension: Secondary | ICD-10-CM

## 2020-10-10 DIAGNOSIS — I729 Aneurysm of unspecified site: Secondary | ICD-10-CM

## 2020-10-10 DIAGNOSIS — N390 Urinary tract infection, site not specified: Secondary | ICD-10-CM

## 2020-10-10 DIAGNOSIS — R0600 Dyspnea, unspecified: Secondary | ICD-10-CM

## 2020-10-10 DIAGNOSIS — K7460 Unspecified cirrhosis of liver: Secondary | ICD-10-CM

## 2020-10-10 DIAGNOSIS — Z01810 Encounter for preprocedural cardiovascular examination: Secondary | ICD-10-CM

## 2020-10-10 DIAGNOSIS — R351 Nocturia: Secondary | ICD-10-CM

## 2020-10-10 DIAGNOSIS — J329 Chronic sinusitis, unspecified: Secondary | ICD-10-CM

## 2020-10-10 DIAGNOSIS — D649 Anemia, unspecified: Secondary | ICD-10-CM

## 2020-10-10 DIAGNOSIS — K760 Fatty (change of) liver, not elsewhere classified: Secondary | ICD-10-CM

## 2020-10-10 DIAGNOSIS — K31819 Angiodysplasia of stomach and duodenum without bleeding: Secondary | ICD-10-CM

## 2020-10-10 DIAGNOSIS — D500 Iron deficiency anemia secondary to blood loss (chronic): Secondary | ICD-10-CM

## 2020-10-10 DIAGNOSIS — R945 Abnormal results of liver function studies: Secondary | ICD-10-CM

## 2020-10-10 DIAGNOSIS — K5730 Diverticulosis of large intestine without perforation or abscess without bleeding: Secondary | ICD-10-CM

## 2020-10-10 LAB — CBC AND DIFF
Lab: 0 K/UL (ref 0–0.20)
Lab: 0.5 K/UL — ABNORMAL LOW (ref 1.0–4.8)
Lab: 0.8 K/UL — ABNORMAL HIGH (ref 0–0.80)
Lab: 1 % (ref 0–2)
Lab: 13 % — ABNORMAL HIGH (ref 4–12)
Lab: 193 K/UL — ABNORMAL LOW (ref >60)
Lab: 2 % (ref 0–5)
Lab: 2.7 M/UL — ABNORMAL LOW (ref 4.0–5.0)
Lab: 22 % — ABNORMAL HIGH (ref >60)
Lab: 23 % — ABNORMAL LOW (ref 36–45)
Lab: 27 pg (ref 26–34)
Lab: 32 g/dL (ref 32.0–36.0)
Lab: 4.6 K/UL (ref 1.8–7.0)
Lab: 6.2 K/UL — ABNORMAL HIGH (ref 4.5–11.0)
Lab: 7.5 g/dL — ABNORMAL LOW (ref 12.0–15.0)
Lab: 75 % (ref 41–77)
Lab: 8.5 FL (ref 7–11)
Lab: 85 FL (ref 80–100)
Lab: 9 % — ABNORMAL LOW (ref 24–44)

## 2020-10-10 LAB — COMPREHENSIVE METABOLIC PANEL
Lab: 1.3 mg/dL — ABNORMAL HIGH (ref 0.4–1.00)
Lab: 127 mg/dL — ABNORMAL HIGH (ref 70–100)
Lab: 134 MMOL/L — ABNORMAL LOW (ref 137–147)
Lab: 39 mg/dL — ABNORMAL HIGH (ref 7–25)
Lab: 4.6 MMOL/L (ref 3.5–5.1)
Lab: 5.8 g/dL — ABNORMAL LOW (ref 6.0–8.0)
Lab: 8.3 mg/dL — ABNORMAL LOW (ref 8.5–10.6)

## 2020-10-10 LAB — PROTIME INR (PT): Lab: 1.5 MMOL/L — ABNORMAL HIGH (ref 0.8–1.2)

## 2020-10-10 MED ORDER — HYOSCYAMINE SULFATE 0.125 MG SL SUBL
125 ug | ORAL_TABLET | SUBLINGUAL | 3 refills | Status: AC | PRN
Start: 2020-10-10 — End: ?

## 2020-10-10 MED ORDER — RIFAXIMIN 550 MG PO TAB
550 mg | ORAL_TABLET | Freq: Two times a day (BID) | ORAL | 3 refills | Status: CN
Start: 2020-10-10 — End: ?

## 2020-10-10 MED ORDER — ESTRADIOL 0.01 % (0.1 MG/GRAM) VA CREA
1 g | VAGINAL | 3 refills | 43.00000 days | Status: AC
Start: 2020-10-10 — End: ?

## 2020-10-10 NOTE — Progress Notes
10/10/20 AUASS: 21 QoL: 4    10/10/20 Random scan= 30 mL

## 2020-10-10 NOTE — Telephone Encounter
10/10/20 Verified active coverage via [employer]. [facility] [place] Reference Number: [id number].   [place] Reference Number: [id number]

## 2020-10-10 NOTE — Patient Instructions
Recommend urine cultures with signs and symptoms of infection:     Please contact office or MyChart Message, with current signs and symptoms and to notify that urine culture was submitted.    If submitted outside of [place] [facility], please forward results    Gershon Cull, PA-C  Department of Urology  [facility]  [address]  [place], [place] [address]  Phone [phone]    fax [phone]          [place]  [facility] [place] [facility] [place] [facility] [employer] [facility]  [address] 1st floor, directly to the left of the Information Desk  [place], [place] [address]  [phone]    Hours  6:30 a.m.-6 p.m. Monday  7 a.m.-6 p.m. Tuesday-Friday  7 a.m.-noon Saturday    [place], [place]  [address] [place], [place] [address]  [phone]    Hours  8 a.m.-4:30 p.m. Monday-Friday    [facility]  [facility]  [address]  [place], [place] [address]  [phone]    Hours  8 a.m.-5 p.m. Monday-Friday    [facility]  [facility]  [facility]  [address]  [place], [place] [address]  [phone]    Hours  8 a.m.-4:30 p.m. Monday-Friday    All pathology locations are closed on major holidays (excluding Veterans Day).      UTI PREVENTION:    Please take 1 gram Vitamin C, 1 gram of d-mannose and 1 cranberry pill daily.    *All of these may be bought over the counter at any pharmacy. If you prefer, you may take a d-mannose and cranberry as a combination pill, which can be purchased at [facility]*    Fingertip Application Method  For Cream    ESTROGEN CREAM APPLICATION:    Wash hands with soap and water. Please draw a line with the estrogen cream from the tip of your index finger to the first line on your finger. Insert your finger into the vagina up until it is comfortable and swirl around. While removing your finger, please spread up and down on external vaginal/ urethral area. This should be used THREE NIGHTS PER WEEK PRIOR TO BEDTIME.  This will only help with improving the quality of vaginal tissue, pain with intercourse and urinary tract prevention if used regularly. It is not absorbed systemically and is not associated with clotting, cancer, stroke, or heart attacks.                1. Wash your hands with soap and water and dry thoroughly.    2. Squeeze tube to express out 1 gram of cream (about enough to cover the tip of your index finger, from the last joint to the finger tip.  (see Figure 1). Please draw a line with the estrogen cream from the tip of your index finger to the first line on your finger.           Figure 1        This instruction sheet is provided to help review the method for applying cream.  Please note that this information is not intended to replace your practitioner?s instructions: always follow your doctor?s specific directions regarding the use of any prescription medications    3. Locate the vaginal opening (see Figure 2).  Immediately above the vaginal opening is the urethra (a small opening where urine is eliminated from  you body).  The urethra may not be as easily identified as the vagina because the opening is much smaller, however, [place] the diagram to determine its approximate location.    4. Insert your finger into the vagina up until it is comfortable and swirl around. While removing your finger, please spread up and down on external vaginal/ urethral area. This should be used THREE NIGHTS PER WEEK PRIOR TO BEDTIME.      5. It is not recommended to use the applicator that comes with the cream. Use only the small finger tip amount as noted above.   6.                       Figure 2    To make an appointment call   Phone [phone]      If you need a medication refill have call Nurse, and leave a message, Phone [phone]      If you need to fax records for review, please fax to: [phone]    Gershon Cull, PA-C  Department of Urology  [place] [facility]  [address]  [place], [place] [address]    Limits fluids prior to bed, elevate legs in the evening, prior to bedtime        [employer] U Pak     [email or web address]        Recommend daily probiotic - this can be purchased over the counter        Hyoscyamine sublingual tablet  Brand Names: A-Spas S/L, HyoMax-SL, Hyosol SL, Levsin SL, OSCIMIN, Symax  What is this medicine?  HYOSCYAMINE (hye oh SYE a meen) is used to treat stomach and bladder problems. This medicine is also used for rhinitis, to reduce some problems caused by Parkinson's disease, and for the treatment of poisoning with drugs that are usually used to treat myasthenia gravis.  How should I use this medicine?  Take this medicine by mouth. Follow the directions on the prescription label. These tablets may be placed under the tongue, and allowed to dissolve, swallowed whole, or chewed. Take your doses at regular intervals. Do not take your medicine more often than directed.  Talk to your pediatrician regarding the use of this medicine in children. Special care may be needed. While this medicine may be prescribed for children as young as 12 years for selected conditions, precautions do apply.  What side effects may I notice from receiving this medicine?  Side effects that you should report to your doctor or health care professional as soon as possible:  ? anxiety, nervousness  ? confusion  ? dizziness or fainting spells  ? fast heartbeat  ? fever  ? pain or difficulty passing urine  ? unusually weak or tired  ? vomiting  Side effects that usually do not require medical attention (report to your doctor or health care professional if they continue or are bothersome):  ? altered taste  ? constipation  ? nausea  What may interact with this medicine?  ? amantadine  ? antacids  ? benztropine  ? donepezil  ? galantamine  ? medicines for hay fever and other allergies  ? medicines for mental depression  ? medicines for mental problems or psychotic disturbances  ? rivastigmine  ? tacrine    What if I miss a dose?  If you miss a dose, take it as soon as you can. If it is almost time for your next dose, take only that  dose. Do not take double or extra doses.  Where should I keep my medicine?  Keep out of the reach of children.  Store at room temperature between 15 and 30 degrees C (59 and 86 degrees F). Throw away any unused medicine after the expiration date.  What should I tell my health care provider before I take this medicine?  They need to know if you have any of these conditions:  ? difficulty passing urine  ? glaucoma  ? heart disease, or previous heart attack  ? myasthenia gravis  ? prostate trouble  ? stomach obstruction  ? ulcerative colitis  ? an unusual or allergic reaction to hyoscyamine, other medicines, foods, dyes, or preservatives  ? pregnant or trying to get pregnant  ? breast-feeding  What should I watch for while using this medicine?  You may get dizzy. Do not drive, use machinery, or do anything that needs mental alertness until you know how this medicine affects you. To reduce the risk of dizzy or fainting spells, do not sit or stand up quickly, especially if you are an older patient. Alcohol can make you more dizzy. Avoid alcoholic drinks.  Stay out of bright light and wear sunglasses if this medicine makes your eyes more sensitive to light.  Your mouth may get dry. Chewing sugarless gum or sucking hard candy, and drinking plenty of water may help. Contact your doctor if the problem does not go away or is severe.  This medicine may cause dry eyes and blurred vision. If you wear contact lenses you may feel some discomfort. Lubricating drops may help. See your eye doctor if the problem does not go away or is severe.  Avoid extreme heat (e.g., hot tubs, saunas). This medicine can cause you to sweat less than normal. Your body temperature could increase to dangerous levels, which may lead to heat stroke.  NOTE:This sheet is a summary. It may not cover all possible information. If you have questions about this medicine, talk to your doctor, pharmacist, or health care provider. Copyright? 2018 [employer]

## 2020-10-10 NOTE — Progress Notes
Date of Service: 10/10/2020     Subjective:             Kristin Moody is a 64 y.o. female.new patient evaluation    Chief Complaint   Patient presents with   ? Bladder infection         History of Present Illness    Kristin Moody is a 64 y.o. female, with a PMhx   Patient Active Problem List    Diagnosis Date Noted   ? Recurrent UTI 10/10/2020   ? Nocturia 10/10/2020   ? Vaginal atrophy 10/10/2020   ? AKI (acute kidney injury) (HCC) 09/12/2020   ? Pleurodynia 06/04/2020   ? Pneumothorax 06/04/2020   ? 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 Hypotension 02/10/2020   ? 'Light-for-dates' infant with signs of fetal malnutrition 01/22/2020   ? 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 Anemia 09/28/2019   ? Encephalopathy 08/12/2019   ? Spasticity 08/10/2019   ? Hyperreflexia 08/10/2019   ? 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 Right leg weakness 05/14/2019   ? 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 Iron (Fe) deficiency anemia 01/19/2019   ? Preop cardiovascular exam 01/07/2019   ? Pre-transplant evaluation for liver transplant 01/06/2019   ? Cirrhosis (HCC) 12/16/2018   ? Acute on chronic anemia 12/16/2018   ? Lactic acidosis 12/16/2018   ? GI bleed 10/24/2018   ? Portal hypertension (HCC) 08/28/2018   ? Confusion 08/27/2018   ? Dyspnea 08/27/2018   ? Chronic abdominal pain 08/27/2018   ? 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 Acute on chronic blood loss anemia 08/27/2018   ? Iron deficiency anemia 04/13/2018   ? Pneumonia due to infectious organism 04/13/2018   ? Melena 04/10/2018   ? Esophageal varices without bleeding (HCC) 02/25/2018   ? 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 Lower abdominal pain 10/24/2014   ? Chest discomfort 09/21/2014   ? Essential hypertension 08/13/2012   ? Abnormal liver function tests 05/13/2011   ? Fatty liver disease, nonalcoholic 05/13/2011   ? Obesity (BMI 30-39.9) 05/13/2011   ? Slow transit constipation 05/13/2011       Presents, 10/10/20, Urology Alisia Ferrari, PA-C.     Referred by, Haynes Hoehn, APR* , with Liver Transplant Team, for evaluation of recurrent UTIs, she is listed for liver transplantation, and recent referral to Kidney transplant team as well.     She presents today with her Husband, and he assisted with history.     She denies hemodialysis. 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 ?Her course has been complicated by severe anemia with recurrent GI bleeding from GAVE/severe portal hypertensive gastropathy. ?She has a history of refractory ascites requiring paracentesis every 1 to 2 weeks and hepatic encephalopathy     Reports episodes of increased urgency, nocturia, and describes difficulty urinating and hesitancy. Urine cultures have noted UTIs.She reports after taking antibiotics, her symptoms do improve.    She reports taking daily Cipro, per GI and Liver providers and has for several years.    She reports urine cultures are done routinely with labs, frequently will be notified that she has a UTI.  Sometimes she will have changes with urinary symptoms however most often no changes with urination.    Signs and symptoms include difficulty urinating, increased urgency frequency, denies fever chills, gross hematuria or flank pain.  She reports 5-6  UTIs over the last year.  She reports hospitalization x1 associated with UTI.    Voiding every 6-8 hours during the day, frequently strains to start her stream.  Reports urgency, rare urge incontinence, occasional stress urinary incontinence, does not wear a pad on a regular basis.  Nocturia x3-4 over the last 6 months, denies nocturnal enuresis.    History of urinary retention in 2019 presented to the emergency room reporting difficulty urinating, unknown residual.    Bowel movement-multiple daily.  Intake-denies fluid restriction, reports water x5.    Diabetes, diagnosis in 2015.    Hysterectomy, reports urethral sling surgery as well.  Denies postop urinary retention, urethral bleeding, pelvic prolapse.  She is not sexually active secondary to vaginal pain and irritation.  She has been prescribed vaginal estrogen cream in the past however not using.    She takes showers, uses baby wipes.      Denies tobacco history  Denies family history of urologic cancer.    She denies previous urologic evaluation, dysuria, gross hematuria, kidney stones, urine tract infections as a child, history of CIC, glaucoma, breast cancer, neurological history, back surgery, pelvic trauma, overactive bladder medication, pill for physical therapy, trial of Levsin or Uribel, catheterized cultures.        PVR    10/10/20 PVR 30cc, random (voided this morning)  AUA    10/10/20 10/10/20 AUASS: 21 QoL: 4      Medical History:   Diagnosis Date   ? Aneurysm (HCC)    ? 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 Diverticulosis of colon     descending and sigmoid colon   ? Dyspnea    ? Essential hypertension 08/13/2012   ? Fatty infiltration of liver    ? HTN (hypertension)    ? Obesity (BMI 30-39.9) 05/13/2011   ? Preop cardiovascular exam 01/07/2019   ? Sinus infection    ? Type 2 diabetes mellitus (HCC) 09/21/2014         Surgical History:   Procedure Laterality Date   ? HYSTERECTOMY  1994   ? UPPER GASTROINTESTINAL ENDOSCOPY  2009   ? COLONOSCOPY  2009   ? CYSTOCELE REPAIR  2009    with endocele repair   ? RECTOCELE REPAIR  03/2009   ? LIVER BIOPSY  07/17/2010   ? COLONOSCOPY N/A 11/08/2017    Performed by Dawna Part, MD at [facility] ENDO   ? ESOPHAGOGASTRODUODENOSCOPY N/A 11/08/2017    Performed by Dawna Part, MD at [facility] ENDO   ? ESOPHAGOGASTRODUODENOSCOPY WITH BIOPSY - FLEXIBLE  11/08/2017    Performed by Dawna Part, MD at [facility] ENDO   ? COLONOSCOPY WITH HOT BIOPSY FORCEPS REMOVAL TUMOR/ POLYP/ OTHER LESION  11/08/2017    Performed by Dawna Part, MD at [facility] ENDO   ? ESOPHAGOGASTRODUODENOSCOPY WITH BAND LIGATION ESOPHAGEAL/ GASTRIC VARICES - FLEXIBLE N/A 04/10/2018    Performed by Normajean Baxter, MD at [facility] ENDO   ? ESOPHAGOGASTRODUODENOSCOPY WITH BIOPSY - FLEXIBLE N/A 04/10/2018    Performed by Normajean Baxter, MD at [facility] ENDO   ? ESOPHAGOGASTRODUODENOSCOPY WITH CONTROL OF BLEEDING - FLEXIBLE N/A 04/25/2018    Performed by Jolee Ewing, MD at [facility] ENDO   ? ESOPHAGOGASTRODUODENOSCOPY WITH BIOPSY - FLEXIBLE with push enteroscopy N/A 08/28/2018    Performed by Celesta Gentile, MD at [facility] ENDO   ? EGD N/A 10/27/2018    Performed by Eliott Nine, MD at [facility] ENDO   ?  ESOPHAGOGASTRODUODENOSCOPY WITH SNARE REMOVAL TUMOR/ POLYP/ OTHER LESION - FLEXIBLE N/A 10/27/2018    Performed by Eliott Nine, MD at [facility] ENDO   ? ESOPHAGOGASTRODUODENOSCOPY WITH BIOPSY - FLEXIBLE N/A 12/16/2018    Performed by Buckles, Vinnie Level, MD at [facility] ENDO   ? ESOPHAGOGASTRODUODENOSCOPY WITH CONTROL OF BLEEDING - FLEXIBLE N/A 12/16/2018    Performed by Buckles, Vinnie Level, MD at [facility] ENDO   ? ESOPHAGOGASTRODUODENOSCOPY WITH SPECIMEN COLLECTION BY BRUSHING/ WASHING N/A 01/22/2019    Performed by Veneta Penton, MD at [facility] ENDO   ? ESOPHAGOGASTRODUODENOSCOPY WITH DILATION ESOPHAGUS WITH BALLOON 30 MM OR GREATER - FLEXIBLE N/A 01/22/2019    Performed by Veneta Penton, MD at [facility] ENDO   ? ESOPHAGOGASTRODUODENOSCOPY WITH BIOPSY - FLEXIBLE N/A 01/22/2019    Performed by Veneta Penton, MD at [facility] ENDO   ? ESOPHAGOGASTRODUODENOSCOPY WITH BIOPSY - FLEXIBLE N/A 06/19/2019    Performed by Dawna Part, MD at [facility] ENDO   ? ESOPHAGOGASTRODUODENOSCOPY [WITH APC]  WITH SPECIMEN COLLECTION BY BRUSHING/ WASHING N/A 08/07/2019    Performed by Dawna Part, MD at [facility] ENDO   ? ESOPHAGOGASTRODUODENOSCOPY WITH SPECIMEN COLLECTION BY BRUSHING/ WASHING N/A 09/10/2019    Performed by Buckles, Vinnie Level, MD at [facility] ENDO   ? ESOPHAGOGASTRODUODENOSCOPY WITH CONTROL OF BLEEDING - FLEXIBLE N/A 09/10/2019    Performed by Buckles, Vinnie Level, MD at [facility] ENDO   ? ESOPHAGOGASTRODUODENOSCOPY WITH CONTROL OF BLEEDING - FLEXIBLE N/A 02/12/2020    Performed by Lenor Derrick, MD at [facility] ENDO   ? ESOPHAGOGASTRODUODENOSCOPY WITH BIOPSY - FLEXIBLE N/A 02/26/2020    Performed by Buckles, Vinnie Level, MD at [facility] ENDO   ? ESOPHAGOGASTRODUODENOSCOPY WITH BIOPSY - FLEXIBLE N/A 05/18/2020    Performed by Lenor Derrick, MD at [facility] ENDO   ? ESOPHAGOGASTRODUODENOSCOPY WITH CONTROL OF BLEEDING - FLEXIBLE N/A 05/18/2020    Performed by Lenor Derrick, MD at [facility] ENDO   ? ESOPHAGOGASTRODUODENOSCOPY WITH CONTROL OF BLEEDING - FLEXIBLE N/A 07/08/2020    Performed by Vertell Novak, MD at [facility] ENDO   ? EGD N/A 08/05/2020    Performed by Vertell Novak, MD at [facility] ENDO   ? ESOPHAGOGASTRODUODENOSCOPY WITH CONTROL OF BLEEDING - FLEXIBLE N/A 08/05/2020    Performed by Vertell Novak, MD at [facility] ENDO   ? CHOLECYSTECTOMY     ? COLONOSCOPY     ? ESOPHAGOGASTRIC FUNDOPLICATION  2003, 2004    laparoscopic   ? ESOPHAGOGASTRIC FUNDOPLICATION     ? HX HYSTERECTOMY     ? PR ESOPHAGOSCOPY FLEXIBLE TRANSORAL DIAGNOSTIC         Allergies:    Allergies   Allergen Reactions   ? Tegaderm BLISTERS   ? Adhesive Tape (Rosins) RASH     Other reaction(s): irritated skin   ? Sulfa (Sulfonamide Antibiotics) RASH     Other reaction(s): Unknown Reaction   ? Codeine NAUSEA AND VOMITING   ? Morphine SEE COMMENTS     Pt reports burns her veins  Other reaction(s): Unknown Reaction   ? Penicillin G SEE COMMENTS     Throat issues, blisters in throat    ? Tramadol NAUSEA ONLY and ITCHING     Takes this medication regularly          Social History     Socioeconomic History   ? Marital status: Married     Spouse name: [name]   ? Number of children: 1   ? Years of education: Not on file   ?  Highest education level: Not on file   Occupational History   ? Occupation: Geographical information systems officer: EMPLOYED   Tobacco Use   ? Smoking status: Never Smoker   ? Smokeless tobacco: Never Used   Vaping Use   ? Vaping Use: Never used   Substance and Sexual Activity   ? Alcohol use: No   ? Drug use: No ? Sexual activity: Not Currently     Partners: Male     Birth control/protection: Other   Other Topics Concern   ? Not on file   Social History Narrative    Negative  For tobacco, alcohol, or illicit drug use.  No high risk sexual behavior or tattoo placement and no transfusion prior to 1992.  Married, one child healthy.  Works in Radio broadcast assistant.    Works in [place]           Family History   Problem Relation Age of Onset   ? Other Mother    ? Kidney Failure Mother    ? Osteoporosis Mother    ? Cancer Father         esophageal   ? Liver Disease Sister         endstage, s/p liver transplant   ? Cancer Brother         tonsil, liver    ? Hip Fracture Neg Hx             Review of Systems    Objective:         ? abaloparatide (TYMLOS) 80 mcg (3,120 mcg/1.56 mL) injection pen Inject eighty mcg under the skin daily. Administer into the periumbilical region of the abdomen while sitting or lying down in case of orthostatic hypotension.   ? ciprofloxacin (CIPRO) 500 mg tablet Take one tablet by mouth daily.   ? duloxetine DR (CYMBALTA) 60 mg capsule Take 60 mg by mouth daily.   ? estradioL (ESTRACE) 0.01 % (0.1 mg/g) vaginal cream Insert or Apply one g to vaginal area three times weekly. APPLY USING FINGERTIP   ? hyoscyamine sulfate (LEVSIN/SL) 0.125 mg sublingual tablet Place one tablet under tongue every 4 hours as needed for Other... (Bladder spasms, urgency, pelvic pain). Max 1.5 mg/ day   ? lactulose (GENERLAC) 10 gram/15 mL oral solution Take 45 mL by mouth three times daily. Titrate to 3-4 BMs/day   ? lidocaine (ASPERCREME PATCH) 4 % topical patch Apply one each topically to affected area daily.   ? midodrine (PROAMATINE) 10 mg tablet TAKE 1 TABLET BY MOUTH THREE TIMES A DAY   ? ondansetron HCL (ZOFRAN) 4 mg tablet Take one tablet by mouth every 6 hours as needed for Nausea or Vomiting.   ? oxyCODONE (ROXICODONE) 5 mg tablet Take one tablet by mouth every 6 hours as needed   ? pantoprazole DR (PROTONIX) 40 mg tablet TAKE 1 TABLET BY MOUTH TWICE A DAY   ? polyethylene glycol 3350 (MIRALAX) 17 g packet Take one packet by mouth twice daily.   ? promethazine (PHENERGAN) 25 mg tablet Take 25 mg by mouth every 4 hours as needed. Just uses it with Tramadol   ? rifAXIMin (XIFAXAN) 550 mg tablet Take one tablet by mouth twice daily.   ? zinc sulfate 220 mg (50 mg elemental zinc) capsule Take one capsule by mouth daily.       Vitals:    10/10/20 0932   BP: 103/37   Pulse: 94   Weight: 69.9 kg (154  lb)   Height: 160 cm (63)   PainSc: Zero       Body mass index is 27.28 kg/m?Marland Kitchen       Physical Exam  Constitutional:       Appearance: She is well-developed.   HENT:      Head: Normocephalic.   Eyes:      Conjunctiva/sclera: Conjunctivae normal.   Cardiovascular:      Rate and Rhythm: Normal rate.   Pulmonary:      Effort: Pulmonary effort is normal.   Abdominal:      Palpations: Abdomen is soft.      Comments: Mild diffuse tenderness, per patient chronic   Genitourinary:     Comments: 10/10/20    Sensation normal  Reflex intact  Hypermobility none  Leak negative  Prolapse none  Estrogen status severe atrophy  Levator tone increased    Difficult exam, vaginal stenosis  No vaginal bleeding  Vaginal bands, likely scarring   No palpable mesh        Musculoskeletal:      Cervical back: Normal range of motion.      Comments: Wheelchair  Bilateral 2 + pitting edema   Skin:     General: Skin is warm and dry.      Comments: Multiple areas of ecchymosis   Neurological:      Mental Status: She is alert and oriented to person, place, and time.              Most Recent Available labs:    Comprehensive Metabolic Profile    Lab Results   Component Value Date/Time    NA 134 (L) 10/10/2020 11:31 AM    K 4.6 10/10/2020 11:31 AM    CL 107 10/10/2020 11:31 AM    CO2 21 10/10/2020 11:31 AM    GAP 6 10/10/2020 11:31 AM    BUN 39 (H) 10/10/2020 11:31 AM    CR 1.37 (H) 10/10/2020 11:31 AM    GLU 127 (H) 10/10/2020 11:31 AM    GLU 172 (H) 02/23/2020 09:40 AM    Lab Results   Component Value Date/Time    CA 8.3 (L) 10/10/2020 11:31 AM    PO4 3.2 10/28/2018 03:45 AM    ALBUMIN 3.0 (L) 10/10/2020 11:31 AM    TOTPROT 5.8 (L) 10/10/2020 11:31 AM    ALKPHOS 151 (H) 10/10/2020 11:31 AM    AST 59 (H) 10/10/2020 11:31 AM    ALT 22 10/10/2020 11:31 AM    TOTBILI 2.1 (H) 10/10/2020 11:31 AM    GFR 39 (L) 10/10/2020 11:31 AM    GFRAA 47 (L) 10/10/2020 11:31 AM        Creatinine Hx (last 10 values):  Creatinine   Date Value Ref Range Status   10/10/2020 1.37 (H) 0.4 - 1.00 MG/DL Final   45/40/9811 9.14 (H) 0.4 - 1.00 MG/DL Final   78/29/5621 3.08 (H) 0.4 - 1.00 MG/DL Final   65/78/4696 2.95 (H) 0.4 - 1.00 MG/DL Final   28/41/3244 0.10 (H) 0.4 - 1.00 MG/DL Final   27/25/3664 4.03 (H) 0.4 - 1.00 MG/DL Final   47/42/5956 3.87 (H) 0.4 - 1.00 MG/DL Final   56/43/3295 1.88 (H) 0.4 - 1.00 MG/DL Final   41/66/0630 1.60 (H) 0.4 - 1.00 MG/DL Final   10/93/2355 7.32 (H) 0.4 - 1.00 MG/DL Final     No components found for: [facility]    Urine    Lab Results   Component Value Date/Time  UCOLOR YELLOW 09/13/2020 08:21 PM    TURBID CLEAR 09/13/2020 08:21 PM    USPGR 1.014 09/13/2020 08:21 PM    UPH 6.0 09/13/2020 08:21 PM    UPROTEIN NEG 09/13/2020 08:21 PM    UAGLU NEG 09/13/2020 08:21 PM    UKET NEG 09/13/2020 08:21 PM    UBILE NEG 09/13/2020 08:21 PM    UBLD NEG 09/13/2020 08:21 PM    UROB NORMAL 09/13/2020 08:21 PM    Lab Results   Component Value Date/Time    UNIT NEG 09/13/2020 08:21 PM    ULEU NEG 09/13/2020 08:21 PM    UWBC 0-2 09/13/2020 08:21 PM    URBC 0-2 09/13/2020 08:21 PM        Lab Results   Component Value Date    URBC 0-2 09/13/2020    URBC 0-2 09/13/2020    URBC 0-2 08/10/2020    URBC 0-2 07/27/2020    URBC 0-2 06/04/2020    URBC 0-2 01/06/2020     Component 2 mo ago   Battery Name URINE CULTURE    Report Status FINAL 08/01/2020    Specimen Description URINE MIDSTREAM ?    Special Requests NONE    Culture ?Abnormal?  <100,000 CFU/ml   ENTEROCOCCUS FAECIUM     Culture ?Abnormal?  <100,000 CFU/ml   Urogenital and/or skin microbiota           Component 2 mo ago   Battery Name URINE CULTURE    Report Status FINAL 08/11/2020    Specimen Description URINE MIDSTREAM ?    Special Requests NONE    Culture <10,000 CFU/ml   Urogenital and/or skin microbiota         Component 3 wk ago   Battery Name URINE CULTURE    Report Status FINAL 09/14/2020    Specimen Description URINE ?     Special Requests No special requests    Culture NO GROWTH    Resulting Agency [facility] [facility]           Culture   Date Value Ref Range Status   10/06/2020 NO GROWTH 4 DAYS         Lab Results   Component Value Date    HGBA1C 4.6 07/10/2019    HGBA1C 5.3 08/27/2018    HGBA1C 6.3 (H) 02/25/2018    HGBA1C 10.0 (H) 08/27/2017           Outside labs or records for review: not available at the time of review    [place] imaging: see report    CT ABDOMEN AND PELVIS with and without contrast 08/13/2020    Clinical Indication: Abdominal pain, hematuria ?   IMPRESSION   1. ?Unremarkable kidneys without nephrolithiasis or hydronephrosis. The   opacified portions of the collecting systems are unremarkable.   2. ?Cirrhosis with portal hypertension as manifested by upper limits of   normal sized spleen, portosystemic varices, and abdominopelvic ascites   which is slightly increased from the prior exam.   3. ?Moderate diffuse body wall edema. Small right pleural effusion.   Adrenal Glands and Kidneys: Unremarkable adrenal glands. The bilateral   kidneys are unremarkable. The opacified portions of the collecting systems   are unremarkable.   Pelvis: Unremarkable urinary bladder. Prior hysterectomy. No pelvic   lymphadenopathy.         Outside imaging: no recent abdominal or pelvic imaging for review        Assessment and Plan:    Assessment and Plan:  Recurrent UTI    Unable to provide urine sample today, random scanner was 30 cc    Also note she is taking daily Cipro per transplant team and has been for several years  * noted recommend in the future possible catheterized urine samples with signs and symptoms of infection, off antibiotics for 2 to 3 days would be best    Did discuss treating positive urinalysis and urine cultures, with signs and symptoms and would recommend avoid treating nonsymptomatic UTIs    Recommend vaginal estrogen cream-as directed  -Recommend vitamin cocktail daily and daily probiotic *however recommend reviewing first with Transplant teams prior to starting   -Recommend Levsin, as needed help with urgency frequency and pelvic pressure, review with Transplant team prior to trial (reviewed sensation of urgency and Frequency, likely bladder spasms and irritation)  *note sent to Liver Transplant Team to review     Avoid wipes  Recommend Theraworx perineal cleanser    Urology consultation requested for recurrent UTIs and planning for transplant-kidney and liver  Recommend further evaluation including cystoscopy  If continued bothersome urinary symptoms, may need URD evaluation    Hold on repeat imaging, recent CT scan in September 2021, reviewed      Follow-up with me in 3 months       Nocturia  Likely related to bilateral lower extremity edema  Elevate legs QHS and limit fluids QHS  Hold on OAB Rx at this time  See above      Vaginal atrophy  Recommend vaginal estrogen cream-as directed  Avoid wipes  Recommend Theraworx perineal cleanser         Orders Placed This Encounter   ? estradioL (ESTRACE) 0.01 % (0.1 mg/g) vaginal cream   ? hyoscyamine sulfate (LEVSIN/SL) 0.125 mg sublingual tablet          *reviewed to contact our office, in 1 week, if recommendations, orders and imaging not available at the time of appointment and we have not contacted them to review.    Gershon Cull, PA-C  Urology      Will send letter to PMD - Concha [place]           Future Appointments   Date Time Provider Department Center   10/11/2020 10:15 AM IR ROOM 7 IR Interv Radio   10/12/2020  7:00 AM CA11 ONC RM 231 [id number] [place] [facility]   10/19/2020  7:30 AM CA11 ONC RM 231 [id number] [place] [facility]   10/20/2020 10:00 AM IR ROOM 7 IR Interv Radio   11/01/2020 10:00 AM SONO - IC GENERAL ROOM [id number] ICC Radiolog   12/02/2020 11:00 AM Haynes Hoehn, APRN-NP CFT LTX HEP CFT [place]   12/06/2020  8:00 AM Britt Bolognese, MD [facility] Urology   02/13/2021  8:00 AM Marshia Ly, PA-C [facility] Urology       Return for See Checkout Note.    For my review this note may have been completed via dictation, using Dragon ~ Gershon Cull, PA-C      Visit Disposition     Dispositions Check-out Note    ? Return for See Checkout Note.   Cystoscopy - recurrent UT - Dr. Lorenz Coaster - NA   F/u BB 4 months            CC'd Chart Routing History      Routing History     From: Melina Fiddler On: 10/10/2020 06:07 PM   To: Haynes Hoehn, APRN-NP   Priority: Routine  Routing Comments:   Mayme Genta,     I saw Ms Follette in the Urology Clinic today for evaluation of recurrent UTIs.   I think she is having a combination of bladder spasms, overactive bladder and occasional UTIs.     I recommended:     - trial of a vitamin cocktail daily ~ UTI supplement prevention   UTI PREVENTION:   1 gram Vitamin C, 1 gram of d-mannose and 1 cranberry pill daily.   -Daily probiotic, over the counter   - Trial of Levsin PRN bladder spasms, flares of urgency and frequency     I wanted to review with your team first, before she started above recommendations.     I have also recommended completing a cystoscopy, to complete evaluation for history of recurrent UTIs.     Thank you ~ Danella Sensing

## 2020-10-11 ENCOUNTER — Encounter: Admit: 2020-10-11 | Discharge: 2020-10-11 | Payer: MEDICARE

## 2020-10-11 ENCOUNTER — Ambulatory Visit: Admit: 2020-10-11 | Discharge: 2020-10-11 | Payer: MEDICARE

## 2020-10-11 DIAGNOSIS — K7460 Unspecified cirrhosis of liver: Secondary | ICD-10-CM

## 2020-10-11 DIAGNOSIS — K31819 Angiodysplasia of stomach and duodenum without bleeding: Secondary | ICD-10-CM

## 2020-10-11 DIAGNOSIS — N952 Postmenopausal atrophic vaginitis: Secondary | ICD-10-CM

## 2020-10-11 DIAGNOSIS — R188 Other ascites: Secondary | ICD-10-CM

## 2020-10-11 DIAGNOSIS — D500 Iron deficiency anemia secondary to blood loss (chronic): Secondary | ICD-10-CM

## 2020-10-11 DIAGNOSIS — K760 Fatty (change of) liver, not elsewhere classified: Secondary | ICD-10-CM

## 2020-10-11 DIAGNOSIS — D649 Anemia, unspecified: Secondary | ICD-10-CM

## 2020-10-11 LAB — GRAM STAIN

## 2020-10-11 MED ORDER — ALBUMIN, HUMAN 25 % IV SOLP
0 refills | Status: CP
Start: 2020-10-11 — End: ?
  Administered 2020-10-11 (×2): 12.5 g via INTRAVENOUS

## 2020-10-11 NOTE — Patient Instructions
IR-PARACENTESIS   A paracentesis is the removal of an abnormal buildup of fluid in your abdominal cavity. This fluid buildup is called ascites and may be caused by conditions such as liver disease, heart failure, or cancer. During this procedure, a needle is inserted into your abdomen to drain the fluid. The fluid may then be sent to the lab for testing if medically indicated. Removal of the fluid may also relieve belly pressure and shortness of breath caused by the ascites.?  POST-PROCEDURE PAIN:   ? Pain control following your procedure is a priority for both you and your Physicians.  ? Some soreness or tenderness at the site is to be expected for several days. We recommend taking over the counter analgesics to help relieve this pain.  ? Alternative methods for pain relief include but not limited to heat or cold compress, relaxation techniques, rest, and changing of positions.  ? If pain continues after 5-7 days or you have severe pain not relieved by medication, please contact us as directed below.?  POST-PROCEDURE ACTIVITY:   ? A responsible adult must drive you home.  ? If you receive sedation, narcotic pain medication or anesthesia for the procedure, you should not drive or operate heavy machinery or do anything that requires concentration for at least 24 hours after procedure completion.  ? It is recommended that a responsible adult be with you until morning.?  POST-PROCEDURE SITE CARE:   ? You will have a small bandage over the procedure site. Keep this dry.  ? You may remove it in 24 hours.  ? You may shower in 24 hours, after removing the bandage.  ? A dry gauze bandage may be reapplied as necessary to protect your clothing as the site may sometimes leak for several days after the procedure.  ? Do not submerge the procedure site for 1 week (no bathtub, swimming, hot tub, etc.)  ? Do not use ointments, creams or powders on the puncture site.  ? Be sure your hands are clean when touching near the site.?  DIET/MEDICATIONS:   ? You may resume your previous diet after the procedure.  ? If you receive sedation or narcotic pain medications, avoid any foods or beverages containing alcohol for at least 24 hours after the procedure.  ? Please see the Medication Reconciliation sheet for instructions regarding resuming your home medications.?  CALL THE DOCTOR IF:   ? Bright red blood soaks the bandage.  ? You have pain not relieved by medication. Some soreness at the site is to be expected.  ? You have signs of infection such as: fever greater than 101F, chills, redness, warmth, swelling, drainage or pus from the puncture site.  For any of the above symptoms or for problems or concerns related to the procedure performed at the [facility], call [phone] Monday-Friday from 7-5p. After-hours and weekends, please call [phone] and ask for the Interventional Radiology Resident on-call.   You or your caregiver should call 911 for any severe symptoms such as excessive bleeding, severe dizziness, trouble breathing or loss of consciousness.   ?  ?

## 2020-10-11 NOTE — Progress Notes
The Prior Authorization for Xifaxan has been initiated for [employer] via Cover My Meds - waiting on clinical questions.  Will continue to follow.    [facility] Patient Advocate  [phone]

## 2020-10-11 NOTE — H&P (View-Only)
IR Pre-Procedure History and Physical/Sedation Plan    Procedure Date: 10/11/2020     Planned Procedure(s):  Ultrasound-guided paracentesis     Indication:  Fluid testing; Therapeutic drainage  __________________________________________________________________    Chief Complaint:  Ascites    History of Present Illness: Kristin Moody is a 64 y.o. female with a history as listed below who presents today for procedure.    Patient Active Problem List    Diagnosis Date Noted   ? Recurrent UTI 10/10/2020   ? Nocturia 10/10/2020   ? Vaginal atrophy 10/10/2020   ? AKI (acute kidney injury) (HCC) 09/12/2020   ? Pleurodynia 06/04/2020   ? Pneumothorax 06/04/2020   ? 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 Hypotension 02/10/2020   ? 'Light-for-dates' infant with signs of fetal malnutrition 01/22/2020   ? 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 Anemia 09/28/2019   ? Encephalopathy 08/12/2019   ? Spasticity 08/10/2019   ? Hyperreflexia 08/10/2019   ? 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 Right leg weakness 05/14/2019   ? 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 Iron (Fe) deficiency anemia 01/19/2019   ? Preop cardiovascular exam 01/07/2019   ? Pre-transplant evaluation for liver transplant 01/06/2019   ? Cirrhosis (HCC) 12/16/2018   ? Acute on chronic anemia 12/16/2018   ? Lactic acidosis 12/16/2018   ? GI bleed 10/24/2018   ? Portal hypertension (HCC) 08/28/2018   ? Confusion 08/27/2018   ? Dyspnea 08/27/2018   ? Chronic abdominal pain 08/27/2018   ? 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 Acute on chronic blood loss anemia 08/27/2018   ? Iron deficiency anemia 04/13/2018   ? Pneumonia due to infectious organism 04/13/2018   ? Melena 04/10/2018   ? Esophageal varices without bleeding (HCC) 02/25/2018   ? 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 Lower abdominal pain 10/24/2014   ? Chest discomfort 09/21/2014   ? Essential hypertension 08/13/2012   ? Abnormal liver function tests 05/13/2011   ? Fatty liver disease, nonalcoholic 05/13/2011   ? Obesity (BMI 30-39.9) 05/13/2011   ? Slow transit constipation 05/13/2011     Medical History:   Diagnosis Date   ? Aneurysm (HCC)    ? 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 Diverticulosis of colon     descending and sigmoid colon   ? Dyspnea    ? Essential hypertension 08/13/2012   ? Fatty infiltration of liver    ? HTN (hypertension)    ? Obesity (BMI 30-39.9) 05/13/2011   ? Preop cardiovascular exam 01/07/2019   ? Sinus infection    ? Type 2 diabetes mellitus (HCC) 09/21/2014      Surgical History:   Procedure Laterality Date   ? HYSTERECTOMY  1994   ? UPPER GASTROINTESTINAL ENDOSCOPY  2009   ? COLONOSCOPY  2009   ? CYSTOCELE REPAIR  2009    with endocele repair   ? RECTOCELE REPAIR  03/2009   ? LIVER BIOPSY  07/17/2010   ? COLONOSCOPY N/A 11/08/2017    Performed by Dawna Part, MD at [facility] ENDO   ? ESOPHAGOGASTRODUODENOSCOPY N/A 11/08/2017    Performed by Dawna Part, MD at [facility] ENDO   ? ESOPHAGOGASTRODUODENOSCOPY WITH BIOPSY - FLEXIBLE  11/08/2017    Performed by Dawna Part, MD at [facility] ENDO   ? COLONOSCOPY WITH HOT BIOPSY FORCEPS REMOVAL TUMOR/  POLYP/ OTHER LESION  11/08/2017    Performed by Dawna Part, MD at [facility] ENDO   ? ESOPHAGOGASTRODUODENOSCOPY WITH BAND LIGATION ESOPHAGEAL/ GASTRIC VARICES - FLEXIBLE N/A 04/10/2018    Performed by Normajean Baxter, MD at [facility] ENDO   ? ESOPHAGOGASTRODUODENOSCOPY WITH BIOPSY - FLEXIBLE N/A 04/10/2018    Performed by Normajean Baxter, MD at [facility] ENDO   ? ESOPHAGOGASTRODUODENOSCOPY WITH CONTROL OF BLEEDING - FLEXIBLE N/A 04/25/2018    Performed by Jolee Ewing, MD at [facility] ENDO   ? ESOPHAGOGASTRODUODENOSCOPY WITH BIOPSY - FLEXIBLE with push enteroscopy N/A 08/28/2018    Performed by Celesta Gentile, MD at [facility] ENDO   ? EGD N/A 10/27/2018    Performed by Eliott Nine, MD at [facility] ENDO   ? ESOPHAGOGASTRODUODENOSCOPY WITH SNARE REMOVAL TUMOR/ POLYP/ OTHER LESION - FLEXIBLE N/A 10/27/2018    Performed by Eliott Nine, MD at [facility] ENDO   ? ESOPHAGOGASTRODUODENOSCOPY WITH BIOPSY - FLEXIBLE N/A 12/16/2018    Performed by Buckles, Vinnie Level, MD at [facility] ENDO   ? ESOPHAGOGASTRODUODENOSCOPY WITH CONTROL OF BLEEDING - FLEXIBLE N/A 12/16/2018    Performed by Buckles, Vinnie Level, MD at [facility] ENDO   ? ESOPHAGOGASTRODUODENOSCOPY WITH SPECIMEN COLLECTION BY BRUSHING/ WASHING N/A 01/22/2019    Performed by Veneta Penton, MD at [facility] ENDO   ? ESOPHAGOGASTRODUODENOSCOPY WITH DILATION ESOPHAGUS WITH BALLOON 30 MM OR GREATER - FLEXIBLE N/A 01/22/2019    Performed by Veneta Penton, MD at [facility] ENDO   ? ESOPHAGOGASTRODUODENOSCOPY WITH BIOPSY - FLEXIBLE N/A 01/22/2019    Performed by Veneta Penton, MD at [facility] ENDO   ? ESOPHAGOGASTRODUODENOSCOPY WITH BIOPSY - FLEXIBLE N/A 06/19/2019    Performed by Dawna Part, MD at [facility] ENDO   ? ESOPHAGOGASTRODUODENOSCOPY [WITH APC]  WITH SPECIMEN COLLECTION BY BRUSHING/ WASHING N/A 08/07/2019    Performed by Dawna Part, MD at [facility] ENDO   ? ESOPHAGOGASTRODUODENOSCOPY WITH SPECIMEN COLLECTION BY BRUSHING/ WASHING N/A 09/10/2019    Performed by Buckles, Vinnie Level, MD at [facility] ENDO   ? ESOPHAGOGASTRODUODENOSCOPY WITH CONTROL OF BLEEDING - FLEXIBLE N/A 09/10/2019    Performed by Buckles, Vinnie Level, MD at [facility] ENDO   ? ESOPHAGOGASTRODUODENOSCOPY WITH CONTROL OF BLEEDING - FLEXIBLE N/A 02/12/2020    Performed by Lenor Derrick, MD at [facility] ENDO   ? ESOPHAGOGASTRODUODENOSCOPY WITH BIOPSY - FLEXIBLE N/A 02/26/2020    Performed by Buckles, Vinnie Level, MD at [facility] ENDO   ? ESOPHAGOGASTRODUODENOSCOPY WITH BIOPSY - FLEXIBLE N/A 05/18/2020    Performed by Lenor Derrick, MD at [facility] ENDO   ? ESOPHAGOGASTRODUODENOSCOPY WITH CONTROL OF BLEEDING - FLEXIBLE N/A 05/18/2020    Performed by Lenor Derrick, MD at [facility] ENDO   ? ESOPHAGOGASTRODUODENOSCOPY WITH CONTROL OF BLEEDING - FLEXIBLE N/A 07/08/2020    Performed by Vertell Novak, MD at [facility] ENDO   ? EGD N/A 08/05/2020    Performed by Vertell Novak, MD at [facility] ENDO   ? ESOPHAGOGASTRODUODENOSCOPY WITH CONTROL OF BLEEDING - FLEXIBLE N/A 08/05/2020    Performed by Vertell Novak, MD at [facility] ENDO   ? CHOLECYSTECTOMY     ? COLONOSCOPY     ? ESOPHAGOGASTRIC FUNDOPLICATION  2003, 2004    laparoscopic   ? ESOPHAGOGASTRIC FUNDOPLICATION     ? HX HYSTERECTOMY     ? PR ESOPHAGOSCOPY FLEXIBLE TRANSORAL DIAGNOSTIC        Social History     Tobacco Use   ? Smoking status: Never Smoker   ? Smokeless tobacco: Never Used  Substance Use Topics   ? Alcohol use: No      Family History   Problem Relation Age of Onset   ? Other Mother    ? Kidney Failure Mother    ? Osteoporosis Mother    ? Cancer Father         esophageal   ? Liver Disease Sister         endstage, s/p liver transplant   ? Cancer Brother         tonsil, liver    ? Hip Fracture Neg Hx       Medications Prior to Admission   Medication Sig Dispense Refill Last Dose   ? abaloparatide (TYMLOS) 80 mcg (3,120 mcg/1.56 mL) injection pen Inject eighty mcg under the skin daily. Administer into the periumbilical region of the abdomen while sitting or lying down in case of orthostatic hypotension. 3.12 mL 3 10/10/2020   ? ciprofloxacin (CIPRO) 500 mg tablet Take one tablet by mouth daily. 90 tablet 3 10/11/2020   ? duloxetine DR (CYMBALTA) 60 mg capsule Take 60 mg by mouth daily.   10/11/2020   ? estradioL (ESTRACE) 0.01 % (0.1 mg/g) vaginal cream Insert or Apply one g to vaginal area three times weekly. APPLY USING FINGERTIP 42.5 g 3 Unknown   ? hyoscyamine sulfate (LEVSIN/SL) 0.125 mg sublingual tablet Place one tablet under tongue every 4 hours as needed for Other... (Bladder spasms, urgency, pelvic pain). Max 1.5 mg/ day 30 tablet 3 Unknown   ? lactulose (GENERLAC) 10 gram/15 mL oral solution Take 45 mL by mouth three times daily. Titrate to 3-4 BMs/day 1892 mL 11 10/11/2020   ? lidocaine (ASPERCREME PATCH) 4 % topical patch Apply one each topically to affected area daily. 5 each 1 Unknown   ? midodrine (PROAMATINE) 10 mg tablet TAKE 1 TABLET BY MOUTH THREE TIMES A DAY 90 tablet 1 10/11/2020   ? ondansetron HCL (ZOFRAN) 4 mg tablet Take one tablet by mouth every 6 hours as needed for Nausea or Vomiting. 120 tablet 1 10/10/2020   ? oxyCODONE (ROXICODONE) 5 mg tablet Take one tablet by mouth every 6 hours as needed 20 tablet 0 Unknown   ? pantoprazole DR (PROTONIX) 40 mg tablet TAKE 1 TABLET BY MOUTH TWICE A DAY 180 tablet 1 10/11/2020   ? polyethylene glycol 3350 (MIRALAX) 17 g packet Take one packet by mouth twice daily.   Unknown   ? promethazine (PHENERGAN) 25 mg tablet Take 25 mg by mouth every 4 hours as needed. Just uses it with Tramadol   10/10/2020   ? rifAXIMin (XIFAXAN) 550 mg tablet Take one tablet by mouth twice daily. 180 tablet 3 10/11/2020   ? zinc sulfate 220 mg (50 mg elemental zinc) capsule Take one capsule by mouth daily. 90 capsule 3 10/11/2020     Allergies   Allergen Reactions   ? Tegaderm BLISTERS   ? Adhesive Tape (Rosins) RASH     Other reaction(s): irritated skin   ? Sulfa (Sulfonamide Antibiotics) RASH     Other reaction(s): Unknown Reaction   ? Codeine NAUSEA AND VOMITING   ? Morphine SEE COMMENTS     Pt reports burns her veins  Other reaction(s): Unknown Reaction   ? Penicillin G SEE COMMENTS     Throat issues, blisters in throat    ? Tramadol NAUSEA ONLY and ITCHING     Takes this medication regularly        Review of Systems  A comprehensive review  of systems was negative.    Physical Exam:  Vital Signs: Last Filed In 24 Hours Vital Signs: 24 Hour Range   BP: 109/67 (11/23 0930)  Temp: 36.6 ?C (97.8 ?F) (11/23 0930)  Pulse: 97 (11/23 0930)  Respirations: 25 PER MINUTE (11/23 0930)  SpO2: 99 % (11/23 0930)  SpO2 Pulse: 98 (11/23 0930) BP: (109)/(67)   Temp:  [36.6 ?C (97.8 ?F)]   Pulse:  [97]   Respirations:  [25 PER MINUTE]   SpO2:  [99 %]           General appearance: alert and no distress noted.  Neurologic: Grossly normal.  Lungs: Non labored.  Heart: regular rate and rhythm  Abdomen: distended    Pre-procedure anxiolysis plan: N/A  Sedation/Medication Plan: Local anesthetic  Personal history of sedation complications: Denies adverse event.   Family history of sedation complications: Denies adverse event.   Medications for Reversal: NA  Discussion/Reviews:  Physician has discussed risks and alternatives of this type of sedation and above planned procedures with patient    NPO Status: NA  Airway:  NA  Head and Neck: NA  Mouth: NA   Anesthesia Classification:  ASA III (A patient with a severe systemic disease that limits activity, but is not incapacitating)  Pregnancy Status: Not Pregnant    Lab/Radiology/Other Diagnostic Tests:  Labs:    Hematology:    Lab Results   Component Value Date    HGB 7.5 10/10/2020    HCT 23.2 10/10/2020    PLTCT 193 10/10/2020    WBC 6.2 10/10/2020    NEUT 75 10/10/2020    ANC 4.67 10/10/2020    LYMPH 9.3 02/23/2020    ALC 0.54 10/10/2020    MONA 13 10/10/2020    AMC 0.81 10/10/2020    ABC 0.07 10/10/2020    BASOPHILS 1.2 02/23/2020    MCV 85.8 10/10/2020    MCHC 32.4 10/10/2020    MPV 8.5 10/10/2020    RDW 22.5 10/10/2020   , Coagulation:    Lab Results   Component Value Date    PT 15.8 02/23/2020    PTT 32.8 02/25/2020    INR 1.5 10/10/2020    and General Chemistry:    Lab Results   Component Value Date    NA 134 10/10/2020    K 4.6 10/10/2020    CL 107 10/10/2020    GAP 6 10/10/2020    BUN 39 10/10/2020    CR 1.37 10/10/2020    GLU 127 10/10/2020    GLU 172 02/23/2020    CA 8.3 10/10/2020    ALBUMIN 3.0 10/10/2020    LACTIC 1.3 02/11/2020    MG 2.2 09/12/2020    TOTBILI 2.1 10/10/2020              Philmore Pali, APRN-NP  Pager [phone]

## 2020-10-11 NOTE — Assessment & Plan Note
Likely related to bilateral lower extremity edema  Elevate legs QHS and limit fluids QHS  Hold on OAB Rx at this time  See above

## 2020-10-11 NOTE — Assessment & Plan Note
Recommend vaginal estrogen cream-as directed  Avoid wipes  Recommend Theraworx perineal cleanser

## 2020-10-11 NOTE — Telephone Encounter
New Referral Intake    TO PATIENT:  Thank you for contacting us, how did  you hear about our transplant program? [facility]    Is English your primary language? yes   If no, do you or your support person need an interpreter?      (HARD STOPS)  *Do you have a support person who will be with you before, during, and after a transplant?yes        Name and Relationship of Support Person: spouse  (over 80 yoa, can/will drive & lives locally to you?)  *Would you accept a lifesaving blood transfusion? yes  *Any active infections? no  *Open wounds or sores? no  *Do you live in a Nursing Facility? no (If YES, at what level? Independent Living, Assisted Living or Skilled/Rehab?)    *Have you had your COVID vaccination? Yes, Moderna If NO, referral nurse will address  If YES, please send a copy of your vacc card.    *Primary Insurance Company: Armed forces training and education officer #:   Group ID:     Secondary Insurance Company: Pharmacologist #:   Group ID:     Who is your kidney doctor?  Dr. Ladona Ridgel   Who is your primary doctor?  Dutch Gray    Have you had a hospitalization in the past 6 months?  Yes, at     Past Medical History:  (if answer is yes, please specify)    How tall are you?  5 3  How much do you weigh? 150   Calculated BMI:  26.6    What is the cause of your kidney disease?  Does not know  Have you ever been diagnosed with Diabetes?  yes     What Type? Type I   At what age were you diagnosed? 11 yrs  Do you see an Endocrinologist?     Are you currently on dialysis?  no   Type/Start date (chronic)/ Schedule:     What center:                Do you receive [employer] ([employer]) premium assistance?   If yes, how much per month?     Have you been denied or listed by another transplant center? IF denied, why? no  Are you currently working with any other transplant centers? no  Have you had a previous organ transplant? no    History of:  Heart disease?  no  Do you have a heart doctor? Dr Barry Dienes  History of heart stents?  no   Are you on medication to keep stents open (ex. Coumadin, Plavix, Brilinta)? no   Are you on medication to help raise your blood pressure (Midodrine)? Midodrine     Lung disease? no, If yes, do you see a Pulmonologist?    Are you on oxygen? no  If Yes, how many liters?     with dialysis or all the time?    Do you wear CPAP (breathing machine at night to sleep)? no               Do you use any marijuana products ? no  Do you use illegal substances or have you in the past? no   Do you use tobacco or nicotine products or have you in the past? No, never   Packs/cans per day?  for years?   When did you quit?    (TO PATIENT: If you currently use tobacco or nicotine products, you need to quit  to be considered a transplant candidate.)                 Liver disease?  yes                            what kind, when diagnosed? Cirrhosis of the liver  History of HIV or Hepatitis? no    If yes, who treated and where?  Cancer? no     If yes, what kind, when diagnosed, where treated?    Psychiatric illness (Depression/Anxiety/Bipolar Disorder)?  no    Do you see a psychiatrist or counselor for any reason?  no               Do you use a cane/walker/wheelchair to get around? When at hospital uses a wheelchair-too long to walk        Past Surgical History:    Have you had any abdominal surgeries? no  Have you had any surgeries on blood vessels (not including a fistula)? no  Have you had any amputations?  no    Demographic and Social History:  (Please do not turn these into a leading questions, ask them as they are written)  What gender were you assigned at birth (female or female)?  Female    Do you still identify as a_____? Female  What race do you identify with (White, Black, Hispanic, Asian, Pacific Islander)? White      Do you have any interested living donors?    If so, they are welcome to come to evaluation with you.    We may need you to sign a Release of Information Authorization Form. Do you have access to a fax machine or an e-mail address we can send it to? Already on mychart      TO PATIENT: You will need to bring your support person with you to the transplant evaluation.  This is required to be considered a candidate for transplant.  If you have interested living donors, they are welcome to come with you, as well.       Note to staff -   Obtain the following records:  ? nephrology note  ? hospital discharge summary within 6 month (or most recent)   ? pathology report    Notify Dr. Curt Bears if delay in scheduling due to lack of records     If patient has possible living donor - have them call:  ? [place] - [phone]  ? Lelon Mast [phone]

## 2020-10-11 NOTE — Other
Immediate Post Procedure Note    Date:  10/11/2020                                         Attending Physician:   Dr. Theda Sers  Performing Provider:  Midge Aver, MD    Consent:  Consent obtained from patient.  Time out performed: Consent obtained, correct patient verified, correct procedure verified, correct site verified, patient marked as necessary.  Pre/Post Procedure Diagnosis: Cirrhosis of liver with ascites  Indications:  Ascites      Procedure(s): Paracentesis  Findings:  Straw colored ascites     Estimated Blood Loss:  None/Negligible  Specimen(s) Removed/Disposition:  Yes, sent to pathology  Complications: None  Patient Tolerated Procedure: Well  Post-Procedure Condition:  stable    Midge Aver, MD

## 2020-10-12 ENCOUNTER — Ambulatory Visit: Admit: 2020-10-12 | Discharge: 2020-10-12 | Payer: MEDICARE

## 2020-10-12 DIAGNOSIS — D500 Iron deficiency anemia secondary to blood loss (chronic): Secondary | ICD-10-CM

## 2020-10-12 DIAGNOSIS — K31819 Angiodysplasia of stomach and duodenum without bleeding: Secondary | ICD-10-CM

## 2020-10-12 MED ORDER — CALCIUM CARBONATE 200 MG CALCIUM (500 MG) PO CHEW
1000 mg | Freq: Once | ORAL | 0 refills | Status: CP
Start: 2020-10-12 — End: ?
  Administered 2020-10-12: 17:00:00 1000 mg via ORAL

## 2020-10-12 NOTE — Progress Notes
Pt. arrived to CA11 infusion room for 2 units RBC. VSS, IV placed and positive for blood return, and assessment complete; please refer to doc flowsheet for details. Both units of blood transfused without incident. No transfusion hypersensitivity reaction noted. VSS throughout infusions, please refer to doc flowsheet. IV taken out. All questions and concerns addressed. Copy of AVS given. Pt. left unit with family in stable condition.

## 2020-10-16 ENCOUNTER — Encounter: Admit: 2020-10-16 | Discharge: 2020-10-16 | Payer: MEDICARE

## 2020-10-16 MED ORDER — MIDODRINE 10 MG PO TAB
ORAL_TABLET | Freq: Three times a day (TID) | 1 refills
Start: 2020-10-16 — End: ?

## 2020-10-17 ENCOUNTER — Ambulatory Visit: Admit: 2020-10-17 | Discharge: 2020-10-17 | Payer: MEDICARE

## 2020-10-17 ENCOUNTER — Encounter: Admit: 2020-10-17 | Discharge: 2020-10-17 | Payer: MEDICARE

## 2020-10-17 DIAGNOSIS — D500 Iron deficiency anemia secondary to blood loss (chronic): Secondary | ICD-10-CM

## 2020-10-17 DIAGNOSIS — K31819 Angiodysplasia of stomach and duodenum without bleeding: Secondary | ICD-10-CM

## 2020-10-17 DIAGNOSIS — D649 Anemia, unspecified: Secondary | ICD-10-CM

## 2020-10-17 DIAGNOSIS — K7460 Unspecified cirrhosis of liver: Secondary | ICD-10-CM

## 2020-10-17 LAB — COMPREHENSIVE METABOLIC PANEL
Lab: 1.2 mg/dL — ABNORMAL HIGH (ref 0.4–1.00)
Lab: 126 mg/dL — ABNORMAL HIGH (ref 70–100)
Lab: 136 MMOL/L — ABNORMAL LOW (ref 137–147)
Lab: 149 U/L — ABNORMAL HIGH (ref 25–110)
Lab: 2.5 mg/dL — ABNORMAL HIGH (ref 0.3–1.2)
Lab: 2.8 g/dL — ABNORMAL LOW (ref 3.5–5.0)
Lab: 22 MMOL/L (ref 21–30)
Lab: 24 U/L (ref 7–56)
Lab: 4.8 MMOL/L (ref 3.5–5.1)
Lab: 42 mL/min — ABNORMAL LOW (ref >60)
Lab: 5.5 g/dL — ABNORMAL LOW (ref 6.0–8.0)
Lab: 51 mL/min — ABNORMAL LOW (ref >60)
Lab: 64 U/L — ABNORMAL HIGH (ref 7–40)
Lab: 7 (ref 3–12)
Lab: 8.4 mg/dL — ABNORMAL LOW (ref 8.5–10.6)

## 2020-10-17 LAB — CBC AND DIFF
Lab: 0.1 K/UL (ref 0–0.20)
Lab: 0.1 K/UL (ref 0–0.45)
Lab: 0.5 K/UL — ABNORMAL LOW (ref 1.0–4.8)
Lab: 0.8 K/UL — ABNORMAL HIGH (ref 0–0.80)
Lab: 14 % — ABNORMAL HIGH (ref 4–12)
Lab: 2 % (ref 0–2)
Lab: 2 % (ref 0–5)
Lab: 20 % — ABNORMAL HIGH (ref 11–15)
Lab: 204 K/UL (ref 150–400)
Lab: 25 % — ABNORMAL LOW (ref 36–45)
Lab: 27 pg (ref 26–34)
Lab: 3 M/UL — ABNORMAL LOW (ref 4.0–5.0)
Lab: 31 g/dL — ABNORMAL LOW (ref 32.0–36.0)
Lab: 4.2 K/UL (ref 1.8–7.0)
Lab: 5.8 K/UL (ref 4.5–11.0)
Lab: 73 % (ref 41–77)
Lab: 8.2 g/dL — ABNORMAL LOW (ref 12.0–15.0)
Lab: 8.4 FL — ABNORMAL HIGH (ref 7–11)
Lab: 85 FL — ABNORMAL LOW (ref 80–100)
Lab: 9 % — ABNORMAL LOW (ref 24–44)

## 2020-10-17 LAB — PROTIME INR (PT): Lab: 1.6 mg/dL — ABNORMAL HIGH (ref 0.8–1.2)

## 2020-10-17 NOTE — Progress Notes
Chart update    10/17/20    Marshia Ly, PA-C  Taggart, Katheren Shams, APRN-NP  Shanda Bumps,     Thank you for reviewing and reaching out.     I probably, didn't make my questions clear, I wanted to make sure your team was okay with adding the following UTI prevention Rx and bladder medication:     *I had recommended adding:     - UTI prevention supplements: 1 gram Vitamin C, 1 gram of d-mannose and 1 cranberry pill daily.   - daily probiotic   - Levsin, PRN   - vaginal estrace cream, apply 3 x week, QHS, fingertip, vaginal application     She would not need to be off Cipro for the cystoscopy procedure       - I had discussed possibly holding cipro for 1-2 days when she has a concern for a UTI for a catheterized urine sample     - we also discussed, urine cultures that are positive with routine labs and no changes with urinary symptoms, we may not recommend treating with additional antibiotics     Thank you ~ Danella Sensing           =============================  Taggart, Katheren Shams, APRN-NP  Marshia Ly, PA-C  Good afternoon [place],   It may be difficult to stop the ciprofloxacin in order to complete the cystoscopy since she is very high risk for spontaneous bacterial peritonitis. She was approved for liver transplant but is currently on hold while she completes evaluation for liver/kidney transplant. Would you recommend this cystoscopy be completed prior to transplant? Unfortunately her MELD score is only 19 and she would not likely be called for transplant until further elevations in MELD score.   Thank you   -Shanda Bumps       ?     Previous Messages    ?  ----- Message -----   From: Melina Fiddler   Sent: 10/10/2020 ? 6:07 PM CST   To: Haynes Hoehn, APRN-NP     Hi Shanda Bumps,     I saw Ms Pauls in the Urology Clinic today for evaluation of recurrent UTIs.   I think she is having a combination of bladder spasms, overactive bladder and occasional UTIs.     I recommended:     - trial of a vitamin cocktail daily ~ UTI supplement prevention   UTI PREVENTION:   1 gram Vitamin C, 1 gram of d-mannose and 1 cranberry pill daily.   -Daily probiotic, over the counter   - Trial of Levsin PRN bladder spasms, flares of urgency and frequency     I wanted to review with your team first, before she started above recommendations.     I have also recommended completing a cystoscopy, to complete evaluation for history of recurrent UTIs.     Thank you ~ Eduard Roux, PA-C   Department of Urology

## 2020-10-18 ENCOUNTER — Encounter: Admit: 2020-10-18 | Discharge: 2020-10-18 | Payer: MEDICARE

## 2020-10-18 NOTE — Telephone Encounter
Called pt to schedule OLT eval update. Scheduled kidney eval, Surg, SW, TFA, Nutr on Mon 12/6. Scheduled cardiology consult on 12/1 but canceled due to conflicting with pt's transfusion. Will call pt back to schedule Cardiology.

## 2020-10-19 ENCOUNTER — Ambulatory Visit: Admit: 2020-10-19 | Discharge: 2020-10-19 | Payer: MEDICARE

## 2020-10-19 ENCOUNTER — Encounter: Admit: 2020-10-19 | Discharge: 2020-10-19 | Payer: MEDICARE

## 2020-10-19 DIAGNOSIS — K31819 Angiodysplasia of stomach and duodenum without bleeding: Secondary | ICD-10-CM

## 2020-10-19 NOTE — Progress Notes
Pt. arrived to [facility] infusion room for 1 uRBCs transfusion. Messaged Garner Nash, APRN, and [employer] since there was no RBC order. 1RBC ordered by T. Zimmer. VSS, PIV placed, accessed and positive for blood return, and assessment complete; please refer to doc flowsheet for details. Blood transfused without incident. No transfusion hypersensitivity reaction noted. VSS throughout infusions, please refer to doc flowsheet. PIV positive for blood return, flushed with saline, and deaccessed per protocol. All questions and concerns addressed. Pt. left unit with husband in stable condition.

## 2020-10-20 ENCOUNTER — Emergency Department: Admit: 2020-10-20 | Discharge: 2020-10-20 | Payer: MEDICARE

## 2020-10-20 ENCOUNTER — Encounter: Admit: 2020-10-20 | Discharge: 2020-10-20 | Payer: MEDICARE

## 2020-10-20 ENCOUNTER — Inpatient Hospital Stay: Admit: 2020-10-20 | Discharge: 2020-10-20 | Payer: MEDICARE

## 2020-10-20 ENCOUNTER — Inpatient Hospital Stay: Admit: 2020-10-20 | Payer: MEDICARE

## 2020-10-20 ENCOUNTER — Ambulatory Visit: Admit: 2020-10-20 | Discharge: 2020-10-20 | Payer: MEDICARE

## 2020-10-20 DIAGNOSIS — E875 Hyperkalemia: Secondary | ICD-10-CM

## 2020-10-20 DIAGNOSIS — K760 Fatty (change of) liver, not elsewhere classified: Secondary | ICD-10-CM

## 2020-10-20 DIAGNOSIS — K5730 Diverticulosis of large intestine without perforation or abscess without bleeding: Secondary | ICD-10-CM

## 2020-10-20 DIAGNOSIS — R0600 Dyspnea, unspecified: Secondary | ICD-10-CM

## 2020-10-20 DIAGNOSIS — R945 Abnormal results of liver function studies: Secondary | ICD-10-CM

## 2020-10-20 DIAGNOSIS — K767 Hepatorenal syndrome: Secondary | ICD-10-CM

## 2020-10-20 DIAGNOSIS — K7290 Hepatic failure, unspecified without coma: Secondary | ICD-10-CM

## 2020-10-20 DIAGNOSIS — K31819 Angiodysplasia of stomach and duodenum without bleeding: Secondary | ICD-10-CM

## 2020-10-20 DIAGNOSIS — I729 Aneurysm of unspecified site: Secondary | ICD-10-CM

## 2020-10-20 DIAGNOSIS — A419 Sepsis, unspecified organism: Secondary | ICD-10-CM

## 2020-10-20 DIAGNOSIS — E872 Acidosis: Secondary | ICD-10-CM

## 2020-10-20 DIAGNOSIS — K7460 Unspecified cirrhosis of liver: Secondary | ICD-10-CM

## 2020-10-20 DIAGNOSIS — N179 Acute kidney failure, unspecified: Secondary | ICD-10-CM

## 2020-10-20 DIAGNOSIS — I10 Essential (primary) hypertension: Secondary | ICD-10-CM

## 2020-10-20 DIAGNOSIS — J329 Chronic sinusitis, unspecified: Secondary | ICD-10-CM

## 2020-10-20 DIAGNOSIS — Z01810 Encounter for preprocedural cardiovascular examination: Secondary | ICD-10-CM

## 2020-10-20 DIAGNOSIS — G9340 Encephalopathy, unspecified: Secondary | ICD-10-CM

## 2020-10-20 DIAGNOSIS — E669 Obesity, unspecified: Secondary | ICD-10-CM

## 2020-10-20 DIAGNOSIS — J9601 Acute respiratory failure with hypoxia: Secondary | ICD-10-CM

## 2020-10-20 DIAGNOSIS — R188 Other ascites: Secondary | ICD-10-CM

## 2020-10-20 DIAGNOSIS — E119 Type 2 diabetes mellitus without complications: Secondary | ICD-10-CM

## 2020-10-20 DIAGNOSIS — K7031 Alcoholic cirrhosis of liver with ascites: Secondary | ICD-10-CM

## 2020-10-20 LAB — CBC AND DIFF
Lab: 15 K/UL — ABNORMAL HIGH (ref 4.5–11.0)
Lab: 21 — ABNORMAL HIGH (ref <20.7)
Lab: 8.3 g/dL — ABNORMAL LOW (ref 12.0–15.0)

## 2020-10-20 LAB — COMPREHENSIVE METABOLIC PANEL
Lab: 106 MMOL/L (ref 98–110)
Lab: 12 MMOL/L — ABNORMAL LOW (ref 21–30)
Lab: 137 MMOL/L — ABNORMAL LOW (ref 137–147)
Lab: 139 U/L — ABNORMAL HIGH (ref 25–110)
Lab: 19 K/UL — ABNORMAL HIGH (ref 3–12)
Lab: 2.2 mg/dL — ABNORMAL HIGH (ref 0.4–1.00)
Lab: 22 mL/min — ABNORMAL LOW (ref >60)
Lab: 229 U/L — ABNORMAL HIGH (ref 7–40)
Lab: 26 mL/min — ABNORMAL LOW (ref >60)
Lab: 29 mg/dL — CL (ref 70–100)
Lab: 3 g/dL — ABNORMAL LOW (ref 3.5–5.0)
Lab: 3.4 mg/dL — ABNORMAL HIGH (ref 0.3–1.2)
Lab: 5.6 MMOL/L — ABNORMAL HIGH (ref 3.5–5.1)
Lab: 5.9 g/dL — ABNORMAL LOW (ref 6.0–8.0)
Lab: 56 mg/dL — ABNORMAL HIGH (ref 7–25)
Lab: 57 U/L — ABNORMAL HIGH (ref 7–56)
Lab: 8.7 mg/dL (ref 8.5–10.6)
Lab: 119 U/L — ABNORMAL HIGH (ref 25–110)
Lab: 13 MMOL/L — ABNORMAL LOW (ref 21–30)
Lab: 137 MMOL/L — ABNORMAL LOW (ref 137–147)
Lab: 19 — ABNORMAL HIGH (ref 3–12)
Lab: 2.2 mg/dL — ABNORMAL HIGH (ref 0.4–1.00)
Lab: 2.6 mg/dL — ABNORMAL HIGH (ref 0.3–1.2)
Lab: 22 mL/min — ABNORMAL LOW (ref >60)
Lab: 27 mL/min — ABNORMAL LOW (ref >60)
Lab: 3 g/dL — ABNORMAL LOW (ref 3.5–5.0)
Lab: 439 U/L — ABNORMAL HIGH (ref 7–40)
Lab: 5.4 g/dL — ABNORMAL LOW (ref 6.0–8.0)
Lab: 8 mg/dL — ABNORMAL LOW (ref 8.5–10.6)
Lab: 99 U/L — ABNORMAL HIGH (ref 7–56)

## 2020-10-20 LAB — PERITONEAL FLUID ALBUMIN

## 2020-10-20 LAB — PERITONEAL FLUID TOTAL PROTEIN

## 2020-10-20 LAB — URINALYSIS DIPSTICK REFLEX TO CULTURE
Lab: NEGATIVE
Lab: NEGATIVE

## 2020-10-20 LAB — POC LACTATE
Lab: 8.5 MMOL/L — ABNORMAL HIGH (ref 0.5–2.0)
Lab: 8.1 MMOL/L — ABNORMAL HIGH (ref 0.5–2.0)

## 2020-10-20 LAB — CORTISOL,RANDOM: Lab: 143 ug/dL — ABNORMAL HIGH (ref 5.0–20.0)

## 2020-10-20 LAB — BLOOD GASES, ARTERIAL
Lab: 13 MMOL/L — ABNORMAL LOW (ref 21–28)
Lab: 14 MMOL/L
Lab: 254 mmHg — ABNORMAL HIGH (ref 80–100)
Lab: 37 mmHg (ref 35–45)
Lab: 7.1 — CL (ref 7.35–7.45)
Lab: 99 % — ABNORMAL HIGH (ref 95–99)
Lab: 10 MMOL/L
Lab: 16 MMOL/L — ABNORMAL LOW (ref 21–28)
Lab: 170 mmHg — ABNORMAL HIGH (ref 80–100)
Lab: 31 mmHg — ABNORMAL LOW (ref 35–45)
Lab: 7.3 — ABNORMAL LOW (ref 7.35–7.45)
Lab: 99 % — ABNORMAL HIGH (ref 95–99)

## 2020-10-20 LAB — POC GLUCOSE
Lab: 108 mg/dL — ABNORMAL HIGH (ref 70–100)
Lab: 62 mg/dL — ABNORMAL LOW (ref 70–100)
Lab: 99 mg/dL (ref 70–100)
Lab: 85 mg/dL (ref 70–100)
Lab: 71 mg/dL (ref 70–100)
Lab: 72 mg/dL (ref 70–100)
Lab: 81 mg/dL (ref 70–100)
Lab: 115 mg/dL — ABNORMAL HIGH (ref 70–100)
Lab: 96 mg/dL (ref 70–100)
Lab: 101 mg/dL — ABNORMAL HIGH (ref 70–100)

## 2020-10-20 LAB — POC HEMATOCRIT
Lab: 29 % — ABNORMAL LOW (ref 36–45)
Lab: 9.9 g/dL — ABNORMAL LOW (ref 12.0–15.0)

## 2020-10-20 LAB — PHOSPHORUS: Lab: 7.1 mg/dL — ABNORMAL HIGH (ref 2.0–4.5)

## 2020-10-20 LAB — URINALYSIS MICROSCOPIC REFLEX TO CULTURE

## 2020-10-20 LAB — POC BLOOD GAS VEN
Lab: 12 MMOL/L
Lab: 31 mmHg — ABNORMAL LOW (ref 36–50)
Lab: 7.2 — ABNORMAL LOW (ref 7.30–7.40)

## 2020-10-20 LAB — PROTIME INR (PT)
Lab: 2.2 M/UL — ABNORMAL HIGH (ref 0.8–1.2)
Lab: 2.5 g/dL — ABNORMAL HIGH (ref 0.8–1.2)

## 2020-10-20 LAB — POC POTASSIUM: Lab: 5.5 MMOL/L — ABNORMAL HIGH (ref 3.5–5.1)

## 2020-10-20 LAB — PERITONEAL FLUID LACTATE DEHYROGENASE: Lab: 62 U/L — ABNORMAL LOW (ref 67–140)

## 2020-10-20 LAB — LACTIC ACID (BG - RAPID LACTATE): Lab: 7.1 MMOL/L — ABNORMAL HIGH (ref 0.5–2.0)

## 2020-10-20 LAB — TSH WITH FREE T4 REFLEX: Lab: 2.5 uU/mL (ref 0.35–5.00)

## 2020-10-20 LAB — AMMONIA: Lab: 151 umol/L — ABNORMAL HIGH (ref 9–35)

## 2020-10-20 LAB — PROCALCITONIN: Lab: 1.4 ng/mL

## 2020-10-20 LAB — MAGNESIUM: Lab: 2.2 mg/dL (ref 1.6–2.6)

## 2020-10-20 LAB — COVID-19 (SARS-COV-2) PCR

## 2020-10-20 LAB — POC SODIUM: Lab: 136 MMOL/L — ABNORMAL LOW (ref 137–147)

## 2020-10-20 LAB — CBC: Lab: 15 K/UL — ABNORMAL HIGH (ref 4.5–11.0)

## 2020-10-20 LAB — BNP (B-TYPE NATRIURETIC PEPTI): Lab: 401 pg/mL — ABNORMAL HIGH (ref 0–100)

## 2020-10-20 LAB — PTT (APTT): Lab: 37 s — ABNORMAL HIGH (ref 24.0–36.5)

## 2020-10-20 MED ORDER — SODIUM BICARBONATE 1 MEQ/ML (8.4 %) IV SOLN
50 meq | Freq: Once | INTRAVENOUS | 0 refills | Status: CP
Start: 2020-10-20 — End: ?
  Administered 2020-10-20: 19:00:00 50 meq via INTRAVENOUS

## 2020-10-20 MED ORDER — ALBUMIN, HUMAN 25 % IV SOLP
50 g | Freq: Once | INTRAVENOUS | 0 refills | Status: CP
Start: 2020-10-20 — End: ?
  Administered 2020-10-21 (×2): 50 g via INTRAVENOUS

## 2020-10-20 MED ORDER — FENTANYL DRIP IN NS 1000MCG/100ML
10-100 ug/h | INTRAVENOUS | 0 refills | Status: AC
Start: 2020-10-20 — End: ?

## 2020-10-20 MED ORDER — LACTULOSE 10 GRAM/15 ML PO LIQUID GROUP
30 g | Freq: Three times a day (TID) | ORAL | 0 refills | Status: AC
Start: 2020-10-20 — End: ?
  Administered 2020-10-21 (×2): 30 g via ORAL

## 2020-10-20 MED ORDER — ROCURONIUM 10 MG/ML IV SOLN
60 mg | Freq: Once | INTRAVENOUS | 0 refills | Status: CP
Start: 2020-10-20 — End: ?
  Administered 2020-10-20: 22:00:00 60 mg via INTRAVENOUS

## 2020-10-20 MED ORDER — RIFAXIMIN 550 MG PO TAB
550 mg | Freq: Two times a day (BID) | ORAL | 0 refills | Status: AC
Start: 2020-10-20 — End: ?
  Administered 2020-10-21: 03:00:00 550 mg via ORAL

## 2020-10-20 MED ORDER — SODIUM BICARBONATE 1 MEQ/ML (8.4 %) IV SOLN
50 meq | Freq: Once | INTRAVENOUS | 0 refills | Status: CP
Start: 2020-10-20 — End: ?
  Administered 2020-10-21: 50 meq via INTRAVENOUS

## 2020-10-20 MED ORDER — MIDODRINE 5 MG PO TAB
10 mg | Freq: Three times a day (TID) | ORAL | 0 refills | Status: DC
Start: 2020-10-20 — End: 2020-10-21
  Administered 2020-10-21 (×2): 10 mg via ORAL

## 2020-10-20 MED ORDER — DEXTROSE 10 % IN WATER (D10W) 10 % IV SOLP
INTRAVENOUS | 0 refills | Status: AC
Start: 2020-10-20 — End: ?
  Administered 2020-10-20: 21:00:00 1000.000 mL via INTRAVENOUS

## 2020-10-20 MED ORDER — ALBUTEROL SULFATE 2.5 MG/0.5 ML IN NEBU
10 mg | Freq: Once | RESPIRATORY_TRACT | 0 refills | Status: CP
Start: 2020-10-20 — End: ?
  Administered 2020-10-20: 19:00:00 10 mg via RESPIRATORY_TRACT

## 2020-10-20 MED ORDER — NALOXONE 0.4 MG/ML IJ SOLN
.08 mg | INTRAVENOUS | 0 refills | Status: AC | PRN
Start: 2020-10-20 — End: ?

## 2020-10-20 MED ORDER — POLYETHYLENE GLYCOL 3350 17 GRAM PO PWPK
17 g | Freq: Two times a day (BID) | ORAL | 0 refills | Status: AC
Start: 2020-10-20 — End: ?
  Administered 2020-10-21: 03:00:00 17 g via ORAL

## 2020-10-20 MED ORDER — PANTOPRAZOLE 40 MG PO TBEC
40 mg | Freq: Two times a day (BID) | ORAL | 0 refills | Status: AC
Start: 2020-10-20 — End: ?

## 2020-10-20 MED ORDER — LINEZOLID IN DEXTROSE 5% 600 MG/300 ML IV PGBK
600 mg | Freq: Once | INTRAVENOUS | 0 refills | Status: CP
Start: 2020-10-20 — End: ?
  Administered 2020-10-20: 21:00:00 600 mg via INTRAVENOUS

## 2020-10-20 MED ORDER — SODIUM ZIRCONIUM CYCLOSILICATE 10 GRAM PO PWPK
10 g | ORAL | 0 refills | Status: AC
Start: 2020-10-20 — End: ?
  Administered 2020-10-21: 04:00:00 10 g via ORAL

## 2020-10-20 MED ORDER — SODIUM ZIRCONIUM CYCLOSILICATE 10 GRAM PO PWPK
10 g | Freq: Once | NASOGASTRIC | 0 refills | Status: CP
Start: 2020-10-20 — End: ?
  Administered 2020-10-21: 10 g via NASOGASTRIC

## 2020-10-20 MED ORDER — SODIUM BICARBONATE 1MEQ/ML INFUSION
250 meq | INTRAVENOUS | 0 refills | Status: DC
Start: 2020-10-20 — End: 2020-10-21
  Administered 2020-10-21: 01:00:00 250 meq via INTRAVENOUS

## 2020-10-20 MED ORDER — SODIUM BICARBONATE 1MEQ/ML INFUSION
250 meq | INTRAVENOUS | 0 refills | Status: AC
Start: 2020-10-20 — End: ?

## 2020-10-20 MED ORDER — CHLORHEXIDINE GLUCONATE 0.12 % MM MWSH
15 mL | Freq: Two times a day (BID) | ORAL | 0 refills | Status: AC
Start: 2020-10-20 — End: ?
  Administered 2020-10-21: 03:00:00 15 mL via ORAL

## 2020-10-20 MED ORDER — NOREPINEPHRINE BITARTRATE-NACL 4 MG/250 ML (16 MCG/ML) IV SOLN
0-.5 ug/kg/min | INTRAVENOUS | 0 refills | Status: AC
Start: 2020-10-20 — End: ?

## 2020-10-20 MED ORDER — LACTATED RINGERS IV SOLP
250 mL | INTRAVENOUS | 0 refills | Status: DC
Start: 2020-10-20 — End: 2020-10-20
  Administered 2020-10-20: 22:00:00 250 mL via INTRAVENOUS

## 2020-10-20 MED ORDER — CEFTRIAXONE INJ 2GM IVP
2 g | Freq: Once | INTRAVENOUS | 0 refills | Status: CP
Start: 2020-10-20 — End: ?
  Administered 2020-10-20: 19:00:00 2 g via INTRAVENOUS

## 2020-10-20 MED ORDER — LACTATED RINGERS IV SOLP
250 mL | INTRAVENOUS | 0 refills | Status: CP
Start: 2020-10-20 — End: ?

## 2020-10-20 MED ORDER — DULOXETINE 60 MG PO CPDR
60 mg | Freq: Every day | ORAL | 0 refills | Status: AC
Start: 2020-10-20 — End: ?
  Administered 2020-10-21: 60 mg via ORAL

## 2020-10-20 MED ORDER — ENOXAPARIN 40 MG/0.4 ML SC SYRG
40 mg | Freq: Every day | SUBCUTANEOUS | 0 refills | Status: AC
Start: 2020-10-20 — End: ?
  Administered 2020-10-21: 04:00:00 40 mg via SUBCUTANEOUS

## 2020-10-20 MED ORDER — SODIUM CHLORIDE 0.9 % IV SOLP
1000 mL | INTRAVENOUS | 0 refills | Status: CP
Start: 2020-10-20 — End: ?
  Administered 2020-10-20: 19:00:00 1000 mL via INTRAVENOUS

## 2020-10-20 MED ORDER — SENNOSIDES-DOCUSATE SODIUM 8.6-50 MG PO TAB
1 | Freq: Two times a day (BID) | ORAL | 0 refills | Status: AC
Start: 2020-10-20 — End: ?
  Administered 2020-10-21: 03:00:00 1 via ORAL

## 2020-10-20 MED ORDER — DEXTROSE 50 % IN WATER (D50W) IV SYRG
50 mL | Freq: Once | INTRAVENOUS | 0 refills | Status: CP
Start: 2020-10-20 — End: ?
  Administered 2020-10-20: 18:00:00 50 mL via INTRAVENOUS

## 2020-10-20 MED ORDER — ZINC SULFATE 50 MG ZINC (220 MG) PO CAP
220 mg | Freq: Every day | ORAL | 0 refills | Status: AC
Start: 2020-10-20 — End: ?
  Administered 2020-10-21: 220 mg via ORAL

## 2020-10-20 MED ORDER — ETOMIDATE 2 MG/ML IV SOLN
20 mg | Freq: Once | INTRAVENOUS | 0 refills | Status: CP
Start: 2020-10-20 — End: ?
  Administered 2020-10-20: 22:00:00 20 mg via INTRAVENOUS

## 2020-10-20 MED ORDER — CALCIUM GLUCONATE 1G/100ML IVPB (MB+)
1 g | Freq: Once | INTRAVENOUS | 0 refills | Status: CP
Start: 2020-10-20 — End: ?
  Administered 2020-10-20 (×2): 1 g via INTRAVENOUS

## 2020-10-20 MED ORDER — NOREPINEPHRINE BITARTRATE-NACL 4 MG/250 ML (16 MCG/ML) IV SOLN
0-.5 ug/kg/min | INTRAVENOUS | 0 refills | Status: DC
Start: 2020-10-20 — End: 2020-10-21
  Administered 2020-10-20: 22:00:00 0.02 ug/kg/min via INTRAVENOUS

## 2020-10-20 MED ORDER — CEFTRIAXONE INJ 1GM IVP
2 g | INTRAVENOUS | 0 refills | Status: AC
Start: 2020-10-20 — End: ?

## 2020-10-20 MED ORDER — LINEZOLID IN DEXTROSE 5% 600 MG/300 ML IV PGBK
600 mg | Freq: Two times a day (BID) | INTRAVENOUS | 0 refills | Status: AC
Start: 2020-10-20 — End: ?

## 2020-10-20 MED ORDER — ONDANSETRON HCL (PF) 4 MG/2 ML IJ SOLN
4 mg | INTRAVENOUS | 0 refills | Status: AC | PRN
Start: 2020-10-20 — End: ?

## 2020-10-20 MED ORDER — MIDODRINE 5 MG PO TAB
10 mg | ORAL | 0 refills | Status: AC
Start: 2020-10-20 — End: ?

## 2020-10-20 MED ORDER — ALBUMIN, HUMAN 25 % IV SOLP
25 g | Freq: Once | INTRAVENOUS | 0 refills | Status: CP
Start: 2020-10-20 — End: ?
  Administered 2020-10-20: 21:00:00 25 g via INTRAVENOUS

## 2020-10-20 MED ORDER — HYOSCYAMINE SULFATE 0.125 MG PO TBDI
.125 mg | SUBLINGUAL | 0 refills | Status: AC | PRN
Start: 2020-10-20 — End: ?

## 2020-10-20 MED ORDER — DEXMEDETOMIDINE IN 0.9 % NACL 400 MCG/100 ML (4 MCG/ML) IV SOLN
.2-1 ug/kg/h | INTRAVENOUS | 0 refills | Status: AC
Start: 2020-10-20 — End: ?
  Administered 2020-10-20: 23:00:00 0.2 ug/kg/h via INTRAVENOUS

## 2020-10-20 MED ADMIN — FENTANYL DRIP IN NS 1000MCG/100ML [30807]: 25 ug/h | INTRAVENOUS | @ 23:00:00 | Stop: 2020-10-20 | NDC 70004020232

## 2020-10-20 NOTE — ED Notes
64 yo female presents to the ED from IR for evaluation of confusion s/po reported fall this morning at 2 AM, on arrival patient c/o sob, patient confused on arrival, stating repetitive words I.e no, yeah, unable to state when she is, husband states she was having normal mentation yesterday, husband reports compliant with her medication including this morning meds, + wheezes heard throughout all lung fields, skin warm and dry, multiple bruising noted to upper extremities, husband reports patient bruises easily, noted skin tears to right upper arm, documented in wounds, unable to find visible signs of  Trauma on head, husband states he does not feel she struck her head, no blood thinners, abdomen firm and distended, no bruising noted to abdomen, bilateral lower extremity swelling noted which husband states is normal for her at this time, was to receive paracentesis today, patient placed on monitor, bed in lowest and locked position, rails up x 2, call light within reach.         Aneurysm ([place])  Cirrhosis of liver ([place])      Comment:  "decompensated" liver failure  Diverticulosis of colon      Comment:  descending and sigmoid colon  Dyspnea  Essential hypertension  Fatty infiltration of liver  HTN (hypertension)  Obesity (BMI 30-39.9)  Preop cardiovascular exam  Sinus infection  Type 2 diabetes mellitus (HCC)     Belongings: t-shirt, bra, flannel shirt, sweatshirt, underwear, stretch pants, socks, tennis shoes, glasses.    All belongings remain with patient at bedside.

## 2020-10-20 NOTE — Telephone Encounter
Pt niece called regarding pt fell and hit her head and appears confused.

## 2020-10-20 NOTE — Telephone Encounter
RN received notification from phone team that a VM was left from patient's niece who reports patient fell out of bed, hit her head and is very confused. Called spouse, Dominica Severin.  Dominica Severin and patient are present in IR at [facility] this morning for para. Dominica Severin reports patient fell out of bed last night and is confused this morning.   RN spoke to bedside Nurse in IR, information above given and notified her that I advised husband to take patient to ER for further evaluation. IR Nurse will speak to NP and send patient to ER. Discussed plan with primary hepatologist who agrees with plan.

## 2020-10-20 NOTE — Progress Notes
Patient arrived to IR accompanied by spouse. Max assist transporting from wheelchair to cart. Spouse reports patient fell out of bed at 0200 this morning and has been increasingly confused since. Elaina Pattee, NP notified. Patient transported to ED via cart accompanied by this RN and pt spouse to ED bay 26. Report given to [place], Therapist, sports.

## 2020-10-20 NOTE — H&P (View-Only)
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 History is obtained through the husband, who is at bedside, due to the patient only being able to say yeah and that's what you do. Husband reports the patient had been otherwise well until this AM when she rolled out of bed at 2AM. He reports she didn't fall or hit her head but just scraped up her right arm. Since that time he notes she has been very altered. In the ED she was reportedly able to answer a few questions but she is no longer able to in the ICU. In the ED, BP was significant for hypotension. Labs were significant for stable anemia, new leukocytosis, hyperkalemia and acute kidney injury, elevated LFTs and INR, lactate 8.5. CT head without acute intracranial hemorrhage. CXR with pulmonary vascular congestion. Patient was admitted to the MICU for further evaluation and management. She was subsequently intubated for airway protection in the setting of altered mental status, and had a central line placed for pressor infusion.    Assessment and Plan       NEURO  Intubated and sedated  -On precedex and fentanyl    Acute metabolic encephalopathy  -Likely 2/2 sepsis, hepatic encephalopathy  -Hx of hepatic encephalopathy  -Ammonia ordered for further evaluation    PULM  Acute hypoxemic respiratory failure  -Likely 2/2 volume overload  -CXR 12/2 with evidence of mild vascular congestion.  -Intubated in the ICU for concerns of airway protection given mental status as well as respiratory failure.  Mode: V/AC+  Set Vt (ml):  [400 milliliters]   Expired Tidal Volume Spont (mL):  [404 milliliters]   Set RR:  [18 breaths/minutes]   Total Respiratory Rate (Breaths/Min):  [21 breaths/minutes]   Minute Volume (L/min):  [7.16 liters/minutes]   %MVspon:  [0 %]   O2%:  [100 %]   PIP Actual:  [28 cm H20]   PEEP/CPAP:  [8 cm H2O]       CV  Shock - likely septic  Hx HTN, orthostatic hypotension  -S/p 1L NS, 250cc LR in ED  -Further fluids guided per NICOM  -Midodrine TID PTA, reordered.  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 Will order an additional 50g 25%  albumin to total of 1mg /kg    RENAL  AKI on CKD  Anion Gap Metabolic Acidosis  Lactic Acidosis  Hyperkalemia  Chronic urinary colonization  Concern for hepatorenal syndrome  -Baseline Cr difficult to ascertain, most recent Cr 11/29 1.28. 12/2 Cr 2.26  -Lactate 8.5 > 8.1 in ED. 7.1 on arrival to ICU  -K shifted with bicarb, albuterol  -UA in ED with 2+ protein, otherwise unremarkable.  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   -Chronically transfusion dependent. Most recently transfused 1U 12/1  -Hb 12/2 8.3, will CTM. Hb upon arrival to ICU 7.0 - suspect this is dilutional due to volume infused in the ED. Will CTM and plan to transfuse for Hb < 7    Leukocytosis  -WBC 15.1 12/2  -Suspect related to sepsis, possible SBP. Will CTM    FEN  -Guided by NICOM. Suspect patient fluid overloaded  -Replace electrolytes PRN  -NPO, enteral nutrition per OG    Prophylaxis Review:  Lines:  PIV, CVC, Art line  Tubes/Drains: Foley  VTE PPX: Lovenox  GI ppx:  Protonix  Insulin: N/A - patient is hypoglycemic and on dextrose gtt  PT/OT: Consulted    Code status: Full code  Dispo: Admit to MICU    Patient seen and discussed with Dr. Lavell Islam, MD      ______________________________________________________  Primary Care Physician: Concha Norway        PMH:  Medical History:   Diagnosis Date   ? Aneurysm (HCC)    ? 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 Diverticulosis of colon     descending and sigmoid colon   ? Dyspnea    ? Essential hypertension 08/13/2012   ? Fatty infiltration of liver    ? HTN (hypertension)    ? Obesity (BMI 30-39.9) 05/13/2011   ? Preop cardiovascular exam 01/07/2019   ? Sinus infection    ? Type 2 diabetes mellitus (HCC) 09/21/2014        PSH:  Surgical History:   Procedure Laterality Date   ? HYSTERECTOMY  1994   ? UPPER GASTROINTESTINAL ENDOSCOPY  2009   ? COLONOSCOPY  2009   ? CYSTOCELE REPAIR  2009    with endocele repair   ? RECTOCELE REPAIR  03/2009   ? LIVER BIOPSY  07/17/2010   ? COLONOSCOPY N/A 11/08/2017    Performed by Dawna Part, MD at [facility] ENDO   ? ESOPHAGOGASTRODUODENOSCOPY N/A 11/08/2017    Performed by Dawna Part, MD at [facility] ENDO   ? ESOPHAGOGASTRODUODENOSCOPY WITH BIOPSY - FLEXIBLE  11/08/2017    Performed by Dawna Part, MD at [facility] ENDO   ? COLONOSCOPY WITH HOT BIOPSY FORCEPS REMOVAL TUMOR/ POLYP/ OTHER LESION  11/08/2017    Performed by Dawna Part, MD at [facility]  ENDO   ? ESOPHAGOGASTRODUODENOSCOPY WITH BAND LIGATION ESOPHAGEAL/ GASTRIC VARICES - FLEXIBLE N/A 04/10/2018    Performed by Normajean Baxter, MD at [facility] ENDO   ? ESOPHAGOGASTRODUODENOSCOPY WITH BIOPSY - FLEXIBLE N/A 04/10/2018    Performed by Normajean Baxter, MD at [facility] ENDO   ? ESOPHAGOGASTRODUODENOSCOPY WITH CONTROL OF BLEEDING - FLEXIBLE N/A 04/25/2018    Performed by Jolee Ewing, MD at [facility] ENDO   ? ESOPHAGOGASTRODUODENOSCOPY WITH BIOPSY - FLEXIBLE with push enteroscopy N/A 08/28/2018    Performed by Celesta Gentile, MD at [facility] ENDO   ? EGD N/A 10/27/2018    Performed by Eliott Nine, MD at [facility] ENDO   ? ESOPHAGOGASTRODUODENOSCOPY WITH SNARE REMOVAL TUMOR/ POLYP/ OTHER LESION - FLEXIBLE N/A 10/27/2018    Performed by Eliott Nine, MD at [facility] ENDO   ? ESOPHAGOGASTRODUODENOSCOPY WITH BIOPSY - FLEXIBLE N/A 12/16/2018    Performed by Buckles, Vinnie Level, MD at [facility] ENDO   ? ESOPHAGOGASTRODUODENOSCOPY WITH CONTROL OF BLEEDING - FLEXIBLE N/A 12/16/2018    Performed by Buckles, Vinnie Level, MD at [facility] ENDO   ? ESOPHAGOGASTRODUODENOSCOPY WITH SPECIMEN COLLECTION BY BRUSHING/ WASHING N/A 01/22/2019    Performed by Veneta Penton, MD at [facility] ENDO   ? ESOPHAGOGASTRODUODENOSCOPY WITH DILATION ESOPHAGUS WITH BALLOON 30 MM OR GREATER - FLEXIBLE N/A 01/22/2019    Performed by Veneta Penton, MD at [facility] ENDO   ? ESOPHAGOGASTRODUODENOSCOPY WITH BIOPSY - FLEXIBLE N/A 01/22/2019    Performed by Veneta Penton, MD at [facility] ENDO   ? ESOPHAGOGASTRODUODENOSCOPY WITH BIOPSY - FLEXIBLE N/A 06/19/2019    Performed by Dawna Part, MD at [facility] ENDO   ? ESOPHAGOGASTRODUODENOSCOPY [WITH APC]  WITH SPECIMEN COLLECTION BY BRUSHING/ WASHING N/A 08/07/2019    Performed by Dawna Part, MD at [facility] ENDO   ? ESOPHAGOGASTRODUODENOSCOPY WITH SPECIMEN COLLECTION BY BRUSHING/ WASHING N/A 09/10/2019    Performed by Buckles, Vinnie Level, MD at [facility] ENDO   ? ESOPHAGOGASTRODUODENOSCOPY WITH CONTROL OF BLEEDING - FLEXIBLE N/A 09/10/2019    Performed by Buckles, Vinnie Level, MD at [facility] ENDO   ? ESOPHAGOGASTRODUODENOSCOPY WITH CONTROL OF BLEEDING - FLEXIBLE N/A 02/12/2020    Performed by Lenor Derrick, MD at [facility] ENDO   ? ESOPHAGOGASTRODUODENOSCOPY WITH BIOPSY - FLEXIBLE N/A 02/26/2020    Performed by Buckles, Vinnie Level, MD at [facility] ENDO   ? ESOPHAGOGASTRODUODENOSCOPY WITH BIOPSY - FLEXIBLE N/A 05/18/2020    Performed by Lenor Derrick, MD at [facility] ENDO   ? ESOPHAGOGASTRODUODENOSCOPY WITH CONTROL OF BLEEDING - FLEXIBLE N/A 05/18/2020    Performed by Lenor Derrick, MD at [facility] ENDO   ? ESOPHAGOGASTRODUODENOSCOPY WITH CONTROL OF BLEEDING - FLEXIBLE N/A 07/08/2020    Performed by Vertell Novak, MD at [facility] ENDO   ? EGD N/A 08/05/2020    Performed by Vertell Novak, MD at [facility] ENDO   ? ESOPHAGOGASTRODUODENOSCOPY WITH CONTROL OF BLEEDING - FLEXIBLE N/A 08/05/2020    Performed by Vertell Novak, MD at [facility] ENDO   ? CHOLECYSTECTOMY     ? COLONOSCOPY     ? ESOPHAGOGASTRIC FUNDOPLICATION  2003, 2004    laparoscopic   ? ESOPHAGOGASTRIC FUNDOPLICATION     ? HX HYSTERECTOMY     ? PR ESOPHAGOSCOPY FLEXIBLE TRANSORAL DIAGNOSTIC          SOCIAL HISTORY:  Social History     Socioeconomic History   ? Marital status: Married     Spouse name: [name]   ? Number of children: 1   ? Years of education: Not  on file   ? Highest education level: Not on file   Occupational History   ? Occupation: Geographical information systems officer: EMPLOYED   Tobacco Use   ? Smoking status: Never Smoker   ? Smokeless tobacco: Never Used   Vaping Use   ? Vaping Use: Never used   Substance and Sexual Activity   ? Alcohol use: No   ? Drug use: No   ? Sexual activity: Not Currently     Partners: Male     Birth control/protection: Other   Other Topics Concern   ? Not on file   Social History Narrative    Negative  For tobacco, alcohol, or illicit drug use.  No high risk sexual behavior or tattoo placement and no transfusion prior to 1992.  Married, one child healthy.  Works in Radio broadcast assistant.    Works in [employer]         FAMILY HISTORY:  Family History   Problem Relation Age of Onset   ? Other Mother    ? Kidney Failure Mother    ? Osteoporosis Mother    ? Cancer Father         esophageal   ? Liver Disease Sister         endstage, s/p liver transplant   ? Cancer Brother         tonsil, liver    ? Hip Fracture Neg Hx         IMMUNIZATIONS:   Immunization History   Administered Date(s) Administered   ? COVID-19 (MODERNA), mRNA vacc, 100 mcg/0.5 mL (PF) 09/05/2020, 10/03/2020   ? Flu Vaccine =>6 Months Quadrivalent PF 08/27/2017, 08/13/2019   ? Flu Vaccine =>65 YO High-Dose Quadrivalent (PF) 09/05/2020   ? Hepatitis A vaccine Adult IM 08/13/2013   ? Pneumococcal Vaccine(13-Val Peds/immunocompromised adult) 09/20/2014, 08/27/2019   ? Varicella-Zoster Vaccine - live (ZOSTAVAX) 08/26/2014   ? Zoster Vaccine Recombinant, Adjuvanted (shingles) IM (vial 2 of 2)(SHINGRIX) 08/15/2019           ALLERGIES:  Tegaderm, Adhesive tape (rosins), Sulfa (sulfonamide antibiotics), Codeine, Morphine, Penicillin g, and Tramadol    HOME MEDICATIONS:  Medications Prior to Admission   Medication Sig   ? abaloparatide (TYMLOS) 80 mcg (3,120 mcg/1.56 mL) injection pen Inject eighty mcg under the skin daily. Administer into the periumbilical region of the abdomen while sitting or lying down in case of orthostatic hypotension.   ? ciprofloxacin (CIPRO) 500 mg tablet Take one tablet by mouth daily.   ? duloxetine DR (CYMBALTA) 60 mg capsule Take 60 mg by mouth daily.   ? estradioL (ESTRACE) 0.01 % (0.1 mg/g) vaginal cream Insert or Apply one g to vaginal area three times weekly. APPLY USING FINGERTIP   ? hyoscyamine sulfate (LEVSIN/SL) 0.125 mg sublingual tablet Place one tablet under tongue every 4 hours as needed for Other... (Bladder spasms, urgency, pelvic pain). Max 1.5 mg/ day   ? lactulose (GENERLAC) 10 gram/15 mL oral solution Take 45 mL by mouth three times daily. Titrate to 3-4 BMs/day   ? lidocaine (ASPERCREME PATCH) 4 % topical patch Apply one each topically to affected area daily.   ? midodrine (PROAMATINE) 10 mg tablet TAKE 1 TABLET BY MOUTH THREE TIMES A DAY   ? ondansetron HCL (ZOFRAN) 4 mg tablet Take one tablet by mouth every 6 hours as needed for Nausea or Vomiting.   ? oxyCODONE (ROXICODONE) 5 mg tablet Take one tablet by mouth every 6 hours as needed   ?  pantoprazole DR (PROTONIX) 40 mg tablet TAKE 1 TABLET BY MOUTH TWICE A DAY   ? polyethylene glycol 3350 (MIRALAX) 17 g packet Take one packet by mouth twice daily.   ? promethazine (PHENERGAN) 25 mg tablet Take 25 mg by mouth every 4 hours as needed. Just uses it with Tramadol   ? rifAXIMin (XIFAXAN) 550 mg tablet Take one tablet by mouth twice daily.   ? zinc sulfate 220 mg (50 mg elemental zinc) capsule Take one capsule by mouth daily.          Review of Systems  Unable to assess due to altered mental status      Physical Exam  General Appearance: No acute distress. Oriented to self only  Skin: significant ecchymoses to bilateral arms, abdomen  HEENT: PERRL, MMM  Chest and Lungs: breath sounds diminished in bilateral bases. No wheezing, rhonchi appreciated.  Heart: RRR, no murmurs  Abdomen: Firm, distended, not clearly tender to palpation. Fluid wave palpable.  Extremities: 4+ pitting edema of bilateral legs.  Neurologic: Oriented to self only. Only says yeah and that's what you do. Opens eyes to voice. Moves all extremities spontaneously.    Laboratory:    Recent Labs     10/20/20  1054 10/20/20  1535   NA 137 137   K 5.6* 5.8*   CL 106 105   CO2 12* 13*   GAP 19* 19*   BUN 56* 54*   CR 2.26* 2.21*   GLU 29* 110*   CA 8.7 8.0*   ALBUMIN 3.0* 3.0*   MG  --  2.2   PO4  --  7.1*       Recent Labs     10/20/20  1054 10/20/20  1535   WBC 15.1* 15.8*   HGB 8.3* 7.0*   HCT 27.2* 23.1*   PLTCT 250 225   INR 2.2* 2.5*   PTT  --  37.2*   AST 229* 439*   ALT 57* 99*   ALKPHOS 139* 119*      CrCl cannot be calculated (Unknown ideal weight.).There were no vitals filed for this visit. No results for input(s): PHART, PO2ART in the last 72 hours.    Invalid input(s): PC02A                      Vital Signs: Last Filed                  Vital Signs: 24 Hour Range   BP: 113/40 (12/02 1635)  Temp: 36.9 ?C (98.5 ?F) (12/02 1529)  Pulse: 119 (12/02 1635)  Respirations: 16 PER MINUTE (12/02 1635)  SpO2: 100 % (12/02 1635)  SpO2 Pulse: 103 (12/02 1400) BP: (85-113)/(35-73)   Temp:  [36.8 ?C (98.2 ?F)-36.9 ?C (98.5 ?F)]   Pulse:  [101-119]   Respirations:  [16 PER MINUTE-26 PER MINUTE]   SpO2:  [88 %-100 %]      There were no vitals filed for this visit.          Radiology and Other Diagnostic Procedures Review:    Reviewed

## 2020-10-20 NOTE — Other
Procedure Note    DESTA BUJAK is a 64 y.o. female.      Central Line Insertion    Date/Time: 10/20/2020 9:59 AM  Performed by: Wesley Desanctis, MD  Authorized by: Shella Maxim, MD   Consent: Verbal consent obtained.  Risks and benefits: risks, benefits and alternatives were discussed  Consent given by: spouse  Patient identity confirmed: arm band  Time out: Immediately prior to procedure a "time out" was called to verify the correct patient, procedure, equipment, support staff and site/side marked as required.  Indications: vascular access    Sedation:  Patient sedated: yes  Sedatives: see MAR for details  Analgesia: see MAR for details    Preparation: skin prepped with chlorhexidine  Location details: left internal jugular  Patient position: Trendelenburg  Catheter type: triple lumen  Pre-procedure: landmarks identified  Ultrasound guidance: yes  Number of attempts: 1  Successful placement: yes  Post-procedure: line sutured and dressing applied  Assessment: blood return through all parts and placement verified by x-ray  Patient tolerance: patient tolerated the procedure well with no immediate complications              Novella Olive, MD

## 2020-10-20 NOTE — ED Notes
Lab results reviewed, discussed with Dr. Ara Kussmaul the elevated WBC count, 2nd IV with POC lactate to be drawn and 2nd set of cultures.

## 2020-10-20 NOTE — ED Notes
Radiology at bedside for PCXR.

## 2020-10-20 NOTE — Telephone Encounter
call returned, advised patient was sent to ER from our staff.

## 2020-10-20 NOTE — Other
Procedure Note    Kristin Moody is a 64 y.o. female.        Procedure: Airway Placement    Intubation    Date/Time: 10/20/2020 9:59 AM    Patient location: ICU  Urgency: urgent  Difficult Airway: No      Pre-Intubation Evaluation    Neck ROM: full  TM distance: >3 FB  Mouth opening: limited  Airway patency: adequate    Dental findings: edentulous    no emergent situation.  Verbal consent obtained.  Consent given by spouse.  Patient identity confirmation: arm band  Timeout performed    Airway Procedure  Indication(s) for airway management: airway protection    Sedation medication: etomidate  Paralytics given: rocuronium  rapid sequence induction  Level of sedation achieved: Unconscious, no arousal with painful stimuli  Preoxygenated: yes  Patient position: sniffing  Neck stabilization: in-line stabilization    Mask difficulty assessment: 0 - not attempted      Procedure Outcome  Final airway type: endotracheal airway  Endotracheal airway: ETT          ETT size (mm): 7.5;   Technique used for successful ETT placement: direct laryngoscopy    Insertion site: oral  Blade type: Macintosh;   Laryngoscope/Videolaryngoscope blade size: 3  Cormack-Lehane classification: grade I - full view of glottis      Measured from: gums  Number of attempts at approach: 1  Placement verified by auscultation, colorimetric CO2 detector and radiography            Complications  Cardiovascular:  Pulmonary:  Procedure:airway not difficult,  Medication:        Performed by: Wesley Desanctis, MD  Authorized by: Shella Maxim, MD    Refer to nursing documentation for vitals/monitoring data            Novella Olive, MD

## 2020-10-20 NOTE — ED Notes
POC Glucose 108

## 2020-10-20 NOTE — ED Notes
Patient's O2 saturation drops to 88% on room air, oxygen supplied via nc with increase to 97%.

## 2020-10-20 NOTE — ED Notes
Pt transported via RN on all monitors with all of her belongings. Husband came with. Report has been given to receiving nurse on [id number].

## 2020-10-20 NOTE — Other
Procedure Note    Kristin Moody is a 64 y.o. female.      Arterial Line    Date/Time: 10/20/2020 9:59 AM  Performed by: Marcelo Baldy, MD  Authorized by: Shella Maxim, MD   Consent: The procedure was performed in an emergent situation.  Relevant documents: relevant documents present and verified  Test results: test results available and properly labeled  Site marked: the operative site was marked  Imaging studies: imaging studies available  Required items: required blood products, implants, devices, and special equipment available  Patient identity confirmed: arm band and hospital-assigned identification number  Time out: Immediately prior to procedure a "time out" was called to verify the correct patient, procedure, equipment, support staff and site/side marked as required.  Preparation: Patient was prepped and draped in the usual sterile fashion.  Indications: multiple ABGs, respiratory failure and hemodynamic monitoring  Location: left brachial    Sedation:  Patient sedated: yes  Vitals: Vital signs were monitored during sedation.    Allen's test normal: yes  Needle gauge: 18  Seldinger technique: Seldinger technique used  Number of attempts: 1  Post-procedure: line sutured and dressing applied  Post-procedure CMS: normal  Patient tolerance: patient tolerated the procedure well with no immediate complications  Attending Attestation: I personally performed the procedure myself            Marcelo Baldy, MD

## 2020-10-20 NOTE — ED Notes
Patient to CT via cart.

## 2020-10-21 NOTE — Other
Procedure Note    CLARINDA OBI is a 64 y.o. female.      Paracentesis    Date/Time: 10/20/2020 9:59 AM  Performed by: Tawana Scale, DO  Authorized by: Shella Maxim, MD   Consent: Verbal consent obtained.  Risks and benefits: risks, benefits and alternatives were discussed  Consent given by: spouse  Patient understanding: patient states understanding of the procedure being performed  Patient consent: the patient's understanding of the procedure matches consent given  Test results: test results available and properly labeled  Site marked: the operative site was marked  Imaging studies: imaging studies available  Patient identity confirmed: arm band and provided demographic data  Initial or subsequent exam: initial  Procedure purpose: diagnostic  Indications: suspected peritonitis  Anesthesia: local infiltration    Anesthesia:  Local Anesthetic: lidocaine 1% with epinephrine  Anesthetic total: 10 mL    Sedation:  Patient sedated: no    Needle gauge: 18  Ultrasound guidance: yes  Puncture site: left lower quadrant  Fluid removed: 55(ml)  Fluid appearance: bilious  Dressing: adhesive bandage.  Patient tolerance: patient tolerated the procedure well with no immediate complications              Senaida Lange, DO

## 2020-10-21 NOTE — Progress Notes
1530: Patient arrived on unit via bed accompanied by RN. Patient transferred to the bed with assistance. Assessment completed, refer to flowsheet for details. Orders released, reviewed, and implemented as appropriate. Patient obtunded, unable to answer any orientation questions or follow commands. Patient currently on 3L NC. BPs variable with pressures ranging from 22-63F systolic. Dr. Geraldine Contras notified of patient obtundation and low BPs. Plan of care reviewed. Will continue to monitor and assess.    1600-1630: VO Dr. Geraldine Contras for nicom with 250 mL LR. Patient 8.3% responsive with continued low BPs. Orders to start levophed to maintain MAP >65.    1630: Decision made to intubate patient per Dr. Geraldine Contras and Dr. Charlie Pitter for airway protection. Procedure completed without complication. 100 mcg phenylephrine given per Dr. Charlie Pitter to maintain MAP >65.    1700: Dr. Geraldine Contras and Dr. Katrinka Blazing at bedside for central line and arterial line placement. Both procedures completed without complication. CXR obtained to confirm central line placement. OK to use per Dr. Geraldine Contras.     1720: Dr. Boyce Medici notified of patient potassium 5.8. Provider to place orders.     1740: Dr. Geraldine Contras and Boyce Medici at bedside for diagnostic paracentesis. Procedure completed without complication. Para fluid sent to lab for testing.     1755: Dr. Geraldine Contras notified of critical ABG result. Provider to place orders.

## 2020-10-23 ENCOUNTER — Encounter: Admit: 2020-10-23 | Discharge: 2020-10-23 | Payer: MEDICARE

## 2020-10-24 NOTE — Progress Notes
Patient removed from waitlist, EOC updated and closed.

## 2020-10-26 ENCOUNTER — Encounter: Admit: 2020-10-26 | Discharge: 2020-10-26 | Payer: MEDICARE

## 2020-11-01 ENCOUNTER — Encounter: Admit: 2020-11-01 | Discharge: 2020-11-01 | Payer: MEDICARE

## 2020-11-19 DEATH — deceased

## 2020-12-02 ENCOUNTER — Encounter: Admit: 2020-12-02 | Discharge: 2020-12-02 | Payer: MEDICARE

## 2020-12-06 ENCOUNTER — Encounter: Admit: 2020-12-06 | Discharge: 2020-12-06 | Payer: MEDICARE

## 2021-01-31 ENCOUNTER — Encounter: Admit: 2021-01-31 | Discharge: 2021-01-31 | Payer: MEDICARE

## 2021-02-13 ENCOUNTER — Encounter: Admit: 2021-02-13 | Discharge: 2021-02-13 | Payer: MEDICARE

## 2022-01-03 ENCOUNTER — Encounter: Admit: 2022-01-03 | Discharge: 2022-01-03 | Payer: MEDICARE

## 2023-12-26 ENCOUNTER — Encounter: Admit: 2023-12-26 | Discharge: 2023-12-26 | Payer: MEDICARE

## 2024-02-27 ENCOUNTER — Encounter: Admit: 2024-02-27 | Discharge: 2024-02-27
